# Patient Record
Sex: Female | Born: 1935 | Race: Black or African American | Hispanic: No | Marital: Married | Smoking: Former smoker
Health system: Southern US, Community
[De-identification: ages and names within clinical notes are randomized; demographics above are authoritative.]

## PROBLEM LIST (undated history)

## (undated) DIAGNOSIS — M8580 Other specified disorders of bone density and structure, unspecified site: Secondary | ICD-10-CM

## (undated) DIAGNOSIS — D509 Iron deficiency anemia, unspecified: Secondary | ICD-10-CM

## (undated) DIAGNOSIS — C801 Malignant (primary) neoplasm, unspecified: Secondary | ICD-10-CM

## (undated) DIAGNOSIS — T7840XA Allergy, unspecified, initial encounter: Secondary | ICD-10-CM

## (undated) DIAGNOSIS — E785 Hyperlipidemia, unspecified: Secondary | ICD-10-CM

## (undated) DIAGNOSIS — E538 Deficiency of other specified B group vitamins: Principal | ICD-10-CM

## (undated) HISTORY — DX: Hyperlipidemia, unspecified: E78.5

## (undated) HISTORY — DX: Iron deficiency anemia, unspecified: D50.9

## (undated) HISTORY — PX: SMALL INTESTINE SURGERY: SHX150

## (undated) HISTORY — DX: Allergy, unspecified, initial encounter: T78.40XA

## (undated) HISTORY — DX: Deficiency of other specified B group vitamins: E53.8

## (undated) HISTORY — DX: Other specified disorders of bone density and structure, unspecified site: M85.80

## (undated) HISTORY — PX: MINOR EXCISION EAR CANAL CYST: SHX6240

## (undated) HISTORY — PX: CATARACT EXTRACTION, BILATERAL: SHX1313

---

## 1998-01-26 ENCOUNTER — Encounter: Admission: RE | Admit: 1998-01-26 | Discharge: 1998-04-26 | Payer: Self-pay

## 2006-09-27 ENCOUNTER — Emergency Department (HOSPITAL_COMMUNITY): Admission: EM | Admit: 2006-09-27 | Discharge: 2006-09-27 | Payer: Self-pay | Admitting: Emergency Medicine

## 2011-07-31 ENCOUNTER — Other Ambulatory Visit: Payer: Self-pay | Admitting: Family Medicine

## 2011-07-31 DIAGNOSIS — R9389 Abnormal findings on diagnostic imaging of other specified body structures: Secondary | ICD-10-CM

## 2011-08-05 ENCOUNTER — Encounter (HOSPITAL_COMMUNITY): Payer: Self-pay

## 2011-08-05 ENCOUNTER — Ambulatory Visit (HOSPITAL_COMMUNITY)
Admission: RE | Admit: 2011-08-05 | Discharge: 2011-08-05 | Disposition: A | Discharge status: Home / Self Care | Payer: BC Managed Care – PPO | Source: Ambulatory Visit | Attending: Family Medicine | Admitting: Family Medicine

## 2011-08-05 DIAGNOSIS — R059 Cough, unspecified: Secondary | ICD-10-CM | POA: Insufficient documentation

## 2011-08-05 DIAGNOSIS — R9389 Abnormal findings on diagnostic imaging of other specified body structures: Secondary | ICD-10-CM

## 2011-08-05 DIAGNOSIS — R918 Other nonspecific abnormal finding of lung field: Secondary | ICD-10-CM | POA: Insufficient documentation

## 2011-08-05 DIAGNOSIS — J438 Other emphysema: Secondary | ICD-10-CM | POA: Insufficient documentation

## 2011-08-05 DIAGNOSIS — R05 Cough: Secondary | ICD-10-CM | POA: Insufficient documentation

## 2012-02-03 ENCOUNTER — Other Ambulatory Visit: Payer: Self-pay | Admitting: Family Medicine

## 2012-02-03 DIAGNOSIS — Z09 Encounter for follow-up examination after completed treatment for conditions other than malignant neoplasm: Secondary | ICD-10-CM

## 2012-02-07 ENCOUNTER — Other Ambulatory Visit (HOSPITAL_COMMUNITY): Payer: BC Managed Care – PPO

## 2012-02-07 ENCOUNTER — Ambulatory Visit (HOSPITAL_COMMUNITY): Payer: Medicare Other | Attending: Family Medicine

## 2012-02-25 ENCOUNTER — Ambulatory Visit (HOSPITAL_COMMUNITY)
Admission: RE | Admit: 2012-02-25 | Discharge: 2012-02-25 | Disposition: A | Discharge status: Home / Self Care | Payer: BC Managed Care – PPO | Source: Ambulatory Visit | Attending: Family Medicine | Admitting: Family Medicine

## 2012-02-25 DIAGNOSIS — Z09 Encounter for follow-up examination after completed treatment for conditions other than malignant neoplasm: Secondary | ICD-10-CM

## 2012-02-25 DIAGNOSIS — R911 Solitary pulmonary nodule: Secondary | ICD-10-CM | POA: Insufficient documentation

## 2012-11-02 ENCOUNTER — Other Ambulatory Visit: Payer: Self-pay | Admitting: Physician Assistant

## 2013-01-06 ENCOUNTER — Ambulatory Visit (INDEPENDENT_AMBULATORY_CARE_PROVIDER_SITE_OTHER): Payer: BC Managed Care – PPO | Admitting: Family Medicine

## 2013-01-06 ENCOUNTER — Encounter: Payer: Self-pay | Admitting: Family Medicine

## 2013-01-06 VITALS — BP 116/61 | HR 75 | Temp 98.7°F | Ht 64.0 in | Wt 150.5 lb

## 2013-01-06 DIAGNOSIS — Z0000 Encounter for general adult medical examination without abnormal findings: Secondary | ICD-10-CM

## 2013-01-06 DIAGNOSIS — J329 Chronic sinusitis, unspecified: Secondary | ICD-10-CM

## 2013-01-06 DIAGNOSIS — E785 Hyperlipidemia, unspecified: Secondary | ICD-10-CM

## 2013-01-06 LAB — CBC WITH DIFFERENTIAL/PLATELET
Basophils Absolute: 0 10*3/uL (ref 0.0–0.1)
Basophils Relative: 0 % (ref 0–1)
Eosinophils Absolute: 0.2 10*3/uL (ref 0.0–0.7)
Eosinophils Relative: 3 % (ref 0–5)
HCT: 31.7 % — ABNORMAL LOW (ref 36.0–46.0)
Hemoglobin: 9.9 g/dL — ABNORMAL LOW (ref 12.0–15.0)
Lymphocytes Relative: 30 % (ref 12–46)
Lymphs Abs: 1.9 10*3/uL (ref 0.7–4.0)
MCH: 22.6 pg — ABNORMAL LOW (ref 26.0–34.0)
MCHC: 31.2 g/dL (ref 30.0–36.0)
MCV: 72.4 fL — ABNORMAL LOW (ref 78.0–100.0)
Monocytes Absolute: 0.6 10*3/uL (ref 0.1–1.0)
Monocytes Relative: 10 % (ref 3–12)
Neutro Abs: 3.5 10*3/uL (ref 1.7–7.7)
Neutrophils Relative %: 57 % (ref 43–77)
Platelets: 233 10*3/uL (ref 150–400)
RBC: 4.38 MIL/uL (ref 3.87–5.11)
RDW: 16.8 % — ABNORMAL HIGH (ref 11.5–15.5)
WBC: 6.2 10*3/uL (ref 4.0–10.5)

## 2013-01-06 LAB — LIPID PANEL
Cholesterol: 200 mg/dL (ref 0–200)
HDL: 52 mg/dL (ref >39)
LDL Cholesterol: 127 mg/dL — ABNORMAL HIGH (ref 0–99)
Total CHOL/HDL Ratio: 3.8 Ratio
Triglycerides: 105 mg/dL (ref <150)
VLDL: 21 mg/dL (ref 0–40)

## 2013-01-06 LAB — TSH: TSH: 1.92 u[IU]/mL (ref 0.350–4.500)

## 2013-01-06 MED ORDER — FLUTICASONE PROPIONATE 50 MCG/ACT NA SUSP: 2.0000 | Freq: Every day | NASAL | Status: DC

## 2013-01-06 MED ORDER — LEVOFLOXACIN 500 MG PO TABS: 500.0000 mg | ORAL_TABLET | Freq: Every day | ORAL | Status: DC

## 2013-01-06 NOTE — Progress Notes (Signed)
  Subjective:    Patient ID: Janet Fletcher, female    DOB: 05-Aug-1935, 77 y.o.   MRN: [id number]  HPI This 77 y.o. female presents for evaluation of allergies and sinus congestion.  She c/o fever and facial discomfort.  She has been feeling washed out and tired.  She has been coughing and sneezing.  She c/o left facial discomfort and states she has been sick over a few weeks.  She has been rx'd abx zpak and it failed according to patient.  She states she is fasting and is due for labs.  She has hx of hyperlipidemia and is requesting a refill on her atorvastatin and is requesting some blood work.    Review of Systems  Constitutional: Positive for diaphoresis, appetite change and fatigue.  HENT: Positive for congestion, sore throat, rhinorrhea, sneezing, postnasal drip and sinus pressure. Negative for facial swelling, neck pain, neck stiffness and ear discharge.   Eyes: Negative.   Respiratory: Negative.   Cardiovascular: Negative.   Gastrointestinal: Negative.   Endocrine: Negative.   Genitourinary: Negative.         Objective:   Physical Exam  Vital signs noted  Well developed well nourished female.  HEENT - Head atraumatic Normocephalic                Eyes - PERRLA, Conjuctiva - clear Sclera- Clear EOMI                Ears - EAC's Wnl TM's Wnl Gross Hearing WNL                Nose - Nares patent                 Throat - oropharanx injected. Tenderness bilateral maxillary sinuses Respiratory - Lungs CTA bilateral Cardiac - RRR S1 and S2 without murmur GI - Abdomen soft Nontender and bowel sounds active x 4 Extremities - No edema. Neuro - Grossly intact.      Assessment & Plan:  Unspecified sinusitis (chronic) - Plan: levofloxacin (LEVAQUIN) 500 MG tablet, fluticasone (FLONASE) 50 MCG/ACT nasal spray, CBC with Differential, Lipid panel, TSH, COMPLETE METABOLIC PANEL WITH GFR  Routine general medical examination at a health care facility - Plan: CBC with Differential, Lipid  panel, TSH, COMPLETE METABOLIC PANEL WITH GFR  Other and unspecified hyperlipidemia - Plan: CBC with Differential, Lipid panel, TSH, COMPLETE METABOLIC PANEL WITH GFR, refill atorvastatin

## 2013-01-06 NOTE — Patient Instructions (Signed)

## 2013-01-07 ENCOUNTER — Other Ambulatory Visit: Payer: Self-pay | Admitting: Family Medicine

## 2013-01-07 LAB — COMPLETE METABOLIC PANEL WITH GFR
ALT: 10 U/L (ref 0–35)
AST: 20 U/L (ref 0–37)
Albumin: 4 g/dL (ref 3.5–5.2)
Alkaline Phosphatase: 77 U/L (ref 39–117)
BUN: 15 mg/dL (ref 6–23)
CO2: 28 mEq/L (ref 19–32)
Calcium: 8.8 mg/dL (ref 8.4–10.5)
Chloride: 107 mEq/L (ref 96–112)
Creat: 0.76 mg/dL (ref 0.50–1.10)
GFR, Est African American: 89 mL/min
GFR, Est Non African American: 78 mL/min
Glucose, Bld: 89 mg/dL (ref 70–99)
Potassium: 4.7 mEq/L (ref 3.5–5.3)
Sodium: 141 mEq/L (ref 135–145)
Total Bilirubin: 0.5 mg/dL (ref 0.3–1.2)
Total Protein: 6.7 g/dL (ref 6.0–8.3)

## 2013-01-08 ENCOUNTER — Other Ambulatory Visit: Payer: Self-pay | Admitting: *Deleted

## 2013-01-08 ENCOUNTER — Other Ambulatory Visit: Payer: Self-pay

## 2013-01-08 DIAGNOSIS — R7989 Other specified abnormal findings of blood chemistry: Secondary | ICD-10-CM

## 2013-01-08 MED ORDER — ATORVASTATIN CALCIUM 40 MG PO TABS: 40.0000 mg | ORAL_TABLET | Freq: Every day | ORAL | Status: DC

## 2013-01-08 NOTE — Progress Notes (Signed)
Pt informed iron otc rck CBC 1mos

## 2013-01-13 ENCOUNTER — Encounter: Payer: Self-pay | Admitting: General Practice

## 2013-01-13 ENCOUNTER — Ambulatory Visit: Payer: BC Managed Care – PPO | Admitting: General Practice

## 2013-01-13 VITALS — BP 128/78 | HR 66 | Temp 96.7°F | Ht 64.0 in | Wt 148.0 lb

## 2013-01-13 DIAGNOSIS — R040 Epistaxis: Secondary | ICD-10-CM

## 2013-01-13 NOTE — Patient Instructions (Addendum)

## 2013-01-13 NOTE — Progress Notes (Signed)
  Subjective:    Patient ID: Janet Fletcher, female    DOB: 1936/03/13, 77 y.o.   MRN: [id number]  Epistaxis  The bleeding has been from both nares. This is a chronic problem. The current episode started yesterday. The problem occurs constantly. The problem has been gradually improving. The bleeding is associated with nothing. She has tried nothing for the symptoms. Her past medical history is significant for frequent nosebleeds.  Patient denies applying pressure to nose to stop bleeding. Denies using nasal sprays this am. Last time she used spray was yesterday.  Reports seeing a ENT specialist two months ago and cauterization performed.     Review of Systems  Constitutional: Negative for fever and chills.  HENT: Positive for nosebleeds. Negative for ear pain, sneezing, postnasal drip and sinus pressure.   Respiratory: Negative for chest tightness and shortness of breath.   Cardiovascular: Negative for chest pain and palpitations.  Neurological: Negative for dizziness, weakness, numbness and headaches.       Objective:   Physical Exam  Constitutional: She is oriented to person, place, and time. She appears well-developed and well-nourished.  HENT:  Head: Normocephalic and atraumatic.  Right Ear: External ear normal.  Left Ear: External ear normal.  Cardiovascular: Normal rate, regular rhythm and normal heart sounds.   Pulmonary/Chest: Effort normal and breath sounds normal. No respiratory distress. She exhibits no tenderness.  Neurological: She is alert and oriented to person, place, and time.  Skin: Skin is warm and dry.  Psychiatric: She has a normal mood and affect.          Assessment & Plan:  1. Epistaxis -hold pressure at Kiesselbach's plexus for 15 minutes with slowing of bleeding -patient refused to have nasal packing, until after seeing specialist - Ambulatory referral to ENT (appointment made immediately, patient reports she has responsible person to drive her to  appointment in [place], with Dr. Andrey Campanile) -Patient verbalized understanding -Coralie Keens, FNP-C

## 2013-08-20 ENCOUNTER — Ambulatory Visit (INDEPENDENT_AMBULATORY_CARE_PROVIDER_SITE_OTHER): Payer: Medicare HMO | Admitting: General Practice

## 2013-08-20 ENCOUNTER — Encounter: Payer: Self-pay | Admitting: General Practice

## 2013-08-20 VITALS — BP 152/69 | HR 56 | Temp 97.3°F | Ht 64.0 in | Wt 146.0 lb

## 2013-08-20 DIAGNOSIS — Z0000 Encounter for general adult medical examination without abnormal findings: Secondary | ICD-10-CM

## 2013-08-20 DIAGNOSIS — E785 Hyperlipidemia, unspecified: Secondary | ICD-10-CM

## 2013-08-20 LAB — POCT CBC
Granulocyte percent: 56 %G (ref 37–80)
HCT, POC: 33.1 % — AB (ref 37.7–47.9)
Hemoglobin: 10.1 g/dL — AB (ref 12.2–16.2)
LYMPH, POC: 2.3 (ref 0.6–3.4)
MCH: 21.9 pg — AB (ref 27–31.2)
MCHC: 30.5 g/dL — AB (ref 31.8–35.4)
MCV: 71.9 fL — AB (ref 80–97)
MPV: 8.5 fL (ref 0–99.8)
PLATELET COUNT, POC: 219 10*3/uL (ref 142–424)
POC Granulocyte: 3.1 (ref 2–6.9)
POC LYMPH PERCENT: 42.4 %L (ref 10–50)
RBC: 4.6 M/uL (ref 4.04–5.48)
RDW, POC: 17.8 %
WBC: 5.5 10*3/uL (ref 4.6–10.2)

## 2013-08-20 MED ORDER — ATORVASTATIN CALCIUM 40 MG PO TABS: 40.0000 mg | ORAL_TABLET | Freq: Every day | ORAL | Status: DC

## 2013-08-20 NOTE — Progress Notes (Signed)
   Subjective:    Patient ID: Janet Fletcher, female    DOB: 1936-04-16, 78 y.o.   MRN: [id number]  HPI Patient presents today for annual exam. She has a history of hyperlipidemia and has been out of medications for past two weeks. Eats a healthy diet and exercises regularly.     Review of Systems  Constitutional: Negative for fever and chills.  Respiratory: Negative for chest tightness and shortness of breath.   Cardiovascular: Negative for chest pain and palpitations.  All other systems reviewed and are negative.       Objective:   Physical Exam  Constitutional: She is oriented to person, place, and time. She appears well-developed and well-nourished.  HENT:  Head: Normocephalic and atraumatic.  Right Ear: External ear normal.  Left Ear: External ear normal.  Mouth/Throat: Oropharynx is clear and moist.  Eyes: EOM are normal. Pupils are equal, round, and reactive to light.  Neck: Normal range of motion. Neck supple. No thyromegaly present.  Cardiovascular: Normal rate, regular rhythm and normal heart sounds.   Pulmonary/Chest: Effort normal and breath sounds normal. No respiratory distress. She exhibits no tenderness.  Abdominal: Soft. Bowel sounds are normal. She exhibits no distension. There is no tenderness.  Lymphadenopathy:    She has no cervical adenopathy.  Neurological: She is alert and oriented to person, place, and time.  Skin: Skin is warm and dry.  Psychiatric: She has a normal mood and affect.          Assessment & Plan:  1. Annual physical exam  - POCT CBC - CMP14+EGFR - Lipid panel  2. HLD (hyperlipidemia)  - atorvastatin (LIPITOR) 40 MG tablet; Take 1 tablet (40 mg total) by mouth daily.  Dispense: 30 tablet; Refill: 11 -Continue all current medications Labs pending F/u in 6 months Discussed benefits of regular exercise and healthy eating Patient verbalized understanding Erby Pian, FNP-C

## 2013-08-20 NOTE — Patient Instructions (Signed)

## 2013-08-21 LAB — CMP14+EGFR
ALK PHOS: 66 IU/L (ref 39–117)
ALT: 9 IU/L (ref 0–32)
AST: 20 IU/L (ref 0–40)
Albumin/Globulin Ratio: 1.7 (ref 1.1–2.5)
Albumin: 4 g/dL (ref 3.5–4.8)
BILIRUBIN TOTAL: 0.3 mg/dL (ref 0.0–1.2)
BUN / CREAT RATIO: 16 (ref 11–26)
BUN: 14 mg/dL (ref 8–27)
CO2: 27 mmol/L (ref 18–29)
CREATININE: 0.88 mg/dL (ref 0.57–1.00)
Calcium: 9.2 mg/dL (ref 8.7–10.3)
Chloride: 104 mmol/L (ref 97–108)
GFR calc non Af Amer: 64 mL/min/{1.73_m2} (ref >59)
GFR, EST AFRICAN AMERICAN: 74 mL/min/{1.73_m2} (ref >59)
GLOBULIN, TOTAL: 2.4 g/dL (ref 1.5–4.5)
Glucose: 90 mg/dL (ref 65–99)
Potassium: 4.4 mmol/L (ref 3.5–5.2)
SODIUM: 141 mmol/L (ref 134–144)
Total Protein: 6.4 g/dL (ref 6.0–8.5)

## 2013-08-21 LAB — LIPID PANEL
CHOL/HDL RATIO: 4.1 ratio (ref 0.0–4.4)
Cholesterol, Total: 207 mg/dL — ABNORMAL HIGH (ref 100–199)
HDL: 51 mg/dL (ref >39)
LDL Calculated: 134 mg/dL — ABNORMAL HIGH (ref 0–99)
TRIGLYCERIDES: 108 mg/dL (ref 0–149)
VLDL Cholesterol Cal: 22 mg/dL (ref 5–40)

## 2014-06-02 ENCOUNTER — Ambulatory Visit (INDEPENDENT_AMBULATORY_CARE_PROVIDER_SITE_OTHER): Payer: Medicare HMO

## 2014-06-02 DIAGNOSIS — Z23 Encounter for immunization: Secondary | ICD-10-CM

## 2014-06-11 ENCOUNTER — Ambulatory Visit (INDEPENDENT_AMBULATORY_CARE_PROVIDER_SITE_OTHER): Payer: Medicare HMO | Admitting: Nurse Practitioner

## 2014-06-11 VITALS — BP 117/63 | HR 64 | Temp 96.8°F | Ht 64.0 in | Wt 144.0 lb

## 2014-06-11 DIAGNOSIS — J069 Acute upper respiratory infection, unspecified: Secondary | ICD-10-CM

## 2014-06-11 DIAGNOSIS — F17210 Nicotine dependence, cigarettes, uncomplicated: Secondary | ICD-10-CM

## 2014-06-11 MED ORDER — AZITHROMYCIN 250 MG PO TABS: ORAL_TABLET | ORAL | Status: DC

## 2014-06-11 MED ORDER — HYDROCODONE-HOMATROPINE 5-1.5 MG/5ML PO SYRP: 5.0000 mL | ORAL_SOLUTION | Freq: Three times a day (TID) | ORAL | Status: DC | PRN

## 2014-06-11 NOTE — Progress Notes (Signed)
   Subjective:    Patient ID: Janet Fletcher, female    DOB: 11/03/1935, 78 y.o.   MRN: [id number]  HPI Patient in today c/o cough and congestion- started over a week ago-     Review of Systems  Constitutional: Negative.  Negative for fever and chills.  HENT: Positive for congestion and rhinorrhea. Negative for sore throat, trouble swallowing and voice change.   Respiratory: Positive for cough.   Cardiovascular: Negative.   Gastrointestinal: Negative.   Skin: Negative.   Neurological: Positive for headaches.  Psychiatric/Behavioral: Negative.   All other systems reviewed and are negative.      Objective:   Physical Exam  Constitutional: She is oriented to person, place, and time. She appears well-developed and well-nourished.  HENT:  Right Ear: Hearing, tympanic membrane, external ear and ear canal normal.  Left Ear: Hearing, tympanic membrane, external ear and ear canal normal.  Nose: Mucosal edema and rhinorrhea present. Right sinus exhibits no maxillary sinus tenderness and no frontal sinus tenderness. Left sinus exhibits no maxillary sinus tenderness and no frontal sinus tenderness.  Mouth/Throat: Uvula is midline, oropharynx is clear and moist and mucous membranes are normal.  Eyes: Pupils are equal, round, and reactive to light.  Neck: Normal range of motion. Neck supple.  Cardiovascular: Normal rate, regular rhythm and normal heart sounds.   Pulmonary/Chest: Effort normal. She has wheezes (faint exp wheezes).  Tight cough  Lymphadenopathy:    She has no cervical adenopathy.  Neurological: She is alert and oriented to person, place, and time.  Skin: Skin is warm and dry.  Psychiatric: She has a normal mood and affect. Her behavior is normal. Judgment and thought content normal.   BP 117/63 mmHg  Pulse 64  Temp(Src) 96.8 F (36 C) (Oral)  Ht 5\' 4"  (1.626 m)  Wt 144 lb (65.318 kg)  BMI 24.71 kg/m2        Assessment & Plan:  1. Upper respiratory infection with  cough and congestion 1. Take meds as prescribed 2. Use a cool mist humidifier especially during the winter months and when heat has been humid. 3. Use saline nose sprays frequently 4. Saline irrigations of the nose can be very helpful if done frequently.  * 4X daily for 1 week*  * Use of a nettie pot can be helpful with this. Follow directions with this* 5. Drink plenty of fluids 6. Keep thermostat turn down low 7.For any cough or congestion  Use plain Mucinex- regular strength or max strength is fine   * Children- consult with Pharmacist for dosing 8. For fever or aces or pains- take tylenol or ibuprofen appropriate for age and weight.  * for fevers greater than 101 orally you may alternate ibuprofen and tylenol every  3 hours.   - azithromycin (ZITHROMAX Z-PAK) 250 MG tablet; As directed  Dispense: 6 each; Refill: 0 - HYDROcodone-homatropine (HYCODAN) 5-1.5 MG/5ML syrup; Take 5 mLs by mouth every 8 (eight) hours as needed for cough.  Dispense: 120 mL; Refill: 0  2. Cigarette smoker one half pack a day or less Stop smoking  Mary-Margaret Hassell Done, FNP

## 2014-06-11 NOTE — Patient Instructions (Signed)

## 2014-08-15 ENCOUNTER — Telehealth: Payer: Self-pay | Admitting: *Deleted

## 2014-08-15 ENCOUNTER — Other Ambulatory Visit: Payer: Self-pay | Admitting: General Practice

## 2014-08-15 NOTE — Telephone Encounter (Signed)
Last seen 06/11/14 MMM  Last lipid 08/20/13

## 2014-08-15 NOTE — Telephone Encounter (Signed)
Deny lipitor refiil Patient NTBS for follow up and lab work

## 2014-08-15 NOTE — Telephone Encounter (Signed)
Aware,Janet Fletcher needs to schedule an appointment for an office visit and lab work to get any refills.

## 2014-08-20 DIAGNOSIS — H8390 Unspecified disease of inner ear, unspecified ear: Secondary | ICD-10-CM | POA: Diagnosis not present

## 2014-08-22 ENCOUNTER — Telehealth: Payer: Self-pay | Admitting: Nurse Practitioner

## 2014-09-15 ENCOUNTER — Ambulatory Visit: Payer: Medicare HMO | Admitting: Nurse Practitioner

## 2014-09-29 ENCOUNTER — Ambulatory Visit (INDEPENDENT_AMBULATORY_CARE_PROVIDER_SITE_OTHER): Payer: Medicare HMO | Admitting: Nurse Practitioner

## 2014-09-29 ENCOUNTER — Encounter: Payer: Self-pay | Admitting: Nurse Practitioner

## 2014-09-29 VITALS — BP 128/65 | HR 60 | Temp 97.2°F | Ht 64.0 in | Wt 144.0 lb

## 2014-09-29 DIAGNOSIS — F411 Generalized anxiety disorder: Secondary | ICD-10-CM | POA: Insufficient documentation

## 2014-09-29 DIAGNOSIS — Z23 Encounter for immunization: Secondary | ICD-10-CM | POA: Diagnosis not present

## 2014-09-29 DIAGNOSIS — E785 Hyperlipidemia, unspecified: Secondary | ICD-10-CM | POA: Diagnosis not present

## 2014-09-29 MED ORDER — ATORVASTATIN CALCIUM 40 MG PO TABS: 40.0000 mg | ORAL_TABLET | Freq: Every day | ORAL | Status: DC

## 2014-09-29 MED ORDER — DIAZEPAM 2 MG PO TABS: 2.0000 mg | ORAL_TABLET | Freq: Two times a day (BID) | ORAL | Status: DC | PRN

## 2014-09-29 NOTE — Patient Instructions (Signed)

## 2014-09-29 NOTE — Progress Notes (Signed)
   Subjective:    Patient ID: Janet Fletcher, female    DOB: 15-Dec-1935, 79 y.o.   MRN: [id number]   Patient here today for follow up of chronic medical problems. C/o lesion in front of right ear.   Hyperlipidemia This is a chronic problem. The current episode started more than 1 year ago. Recent lipid tests were reviewed and are variable. She has no history of diabetes, hypothyroidism or obesity. Current antihyperlipidemic treatment includes statins. The current treatment provides moderate improvement of lipids. Compliance problems include adherence to diet and adherence to exercise.  Risk factors for coronary artery disease include dyslipidemia, post-menopausal and a sedentary lifestyle.  GAD Valium daily- keeps her calm   Review of Systems  Constitutional: Negative.   HENT: Negative.   Respiratory: Negative.   Cardiovascular: Negative.   Gastrointestinal: Negative.   Genitourinary: Negative.   Neurological: Negative.   Psychiatric/Behavioral: Negative.   All other systems reviewed and are negative.      Objective:   Physical Exam  Constitutional: She is oriented to person, place, and time. She appears well-developed and well-nourished.  HENT:  Nose: Nose normal.  Mouth/Throat: Oropharynx is clear and moist.  Eyes: EOM are normal.  Neck: Trachea normal, normal range of motion and full passive range of motion without pain. Neck supple. No JVD present. Carotid bruit is not present. No thyromegaly present.  Cardiovascular: Normal rate, regular rhythm, normal heart sounds and intact distal pulses.  Exam reveals no gallop and no friction rub.   No murmur heard. Pulmonary/Chest: Effort normal and breath sounds normal.  Abdominal: Soft. Bowel sounds are normal. She exhibits no distension and no mass. There is no tenderness.  Musculoskeletal: Normal range of motion.  Lymphadenopathy:    She has no cervical adenopathy.  Neurological: She is alert and oriented to person, place, and  time. She has normal reflexes.  Skin: Skin is warm and dry.  Flesh colored skin tag anterior to right ear  Psychiatric: She has a normal mood and affect. Her behavior is normal. Judgment and thought content normal.  BP 128/65 mmHg  Pulse 60  Temp(Src) 97.2 F (36.2 C) (Oral)  Ht _0  (1.626 m)  Wt 144 lb (65.318 kg)  BMI 24.71 kg/m2         Assessment & Plan:  1. Hyperlipidemia with target LDL less than 100 Low fat diet - atorvastatin (LIPITOR) 40 MG tablet; Take 1 tablet (40 mg total) by mouth daily.  Dispense: 30 tablet; Refill: 11 - CMP14+EGFR - NMR, lipoprofile  2. GAD (generalized anxiety disorder) Stress management - diazepam (VALIUM) 2 MG tablet; Take 1 tablet (2 mg total) by mouth every 12 (twelve) hours as needed for anxiety.  Dispense: 30 tablet; Refill: 1   prevnar 13 today Labs pending Health maintenance reviewed Diet and exercise encouraged Continue all meds Follow up  In 3 month    [place], FNP

## 2014-09-29 NOTE — Addendum Note (Signed)
Addended by: Rolena Infante on: 09/29/2014 12:57 PM   Modules accepted: Orders

## 2014-09-30 LAB — NMR, LIPOPROFILE
Cholesterol: 212 mg/dL — ABNORMAL HIGH (ref 100–199)
HDL Cholesterol by NMR: 58 mg/dL (ref >39)
HDL Particle Number: 31.1 umol/L (ref >=30.5)
LDL Particle Number: 1427 nmol/L — ABNORMAL HIGH (ref <1000)
LDL SIZE: 20.9 nm (ref >20.5)
LDL-C: 133 mg/dL — AB (ref 0–99)
LP-IR Score: <25 (ref <=45)
Small LDL Particle Number: 603 nmol/L — ABNORMAL HIGH (ref <=527)
Triglycerides by NMR: 104 mg/dL (ref 0–149)

## 2014-09-30 LAB — CMP14+EGFR
A/G RATIO: 1.4 (ref 1.1–2.5)
ALBUMIN: 3.9 g/dL (ref 3.5–4.8)
ALT: 8 IU/L (ref 0–32)
AST: 20 IU/L (ref 0–40)
Alkaline Phosphatase: 75 IU/L (ref 39–117)
BUN/Creatinine Ratio: 16 (ref 11–26)
BUN: 12 mg/dL (ref 8–27)
Bilirubin Total: 0.4 mg/dL (ref 0.0–1.2)
CALCIUM: 9.2 mg/dL (ref 8.7–10.3)
CO2: 22 mmol/L (ref 18–29)
CREATININE: 0.73 mg/dL (ref 0.57–1.00)
Chloride: 103 mmol/L (ref 97–108)
GFR calc Af Amer: 93 mL/min/{1.73_m2} (ref >59)
GFR, EST NON AFRICAN AMERICAN: 80 mL/min/{1.73_m2} (ref >59)
GLOBULIN, TOTAL: 2.7 g/dL (ref 1.5–4.5)
Glucose: 78 mg/dL (ref 65–99)
Potassium: 4.1 mmol/L (ref 3.5–5.2)
Sodium: 143 mmol/L (ref 134–144)
TOTAL PROTEIN: 6.6 g/dL (ref 6.0–8.5)

## 2014-11-09 DIAGNOSIS — H25813 Combined forms of age-related cataract, bilateral: Secondary | ICD-10-CM | POA: Diagnosis not present

## 2014-11-09 DIAGNOSIS — H527 Unspecified disorder of refraction: Secondary | ICD-10-CM | POA: Diagnosis not present

## 2014-12-07 DIAGNOSIS — H527 Unspecified disorder of refraction: Secondary | ICD-10-CM | POA: Diagnosis not present

## 2014-12-07 DIAGNOSIS — H25813 Combined forms of age-related cataract, bilateral: Secondary | ICD-10-CM | POA: Diagnosis not present

## 2014-12-15 DIAGNOSIS — H25812 Combined forms of age-related cataract, left eye: Secondary | ICD-10-CM | POA: Insufficient documentation

## 2014-12-15 DIAGNOSIS — H527 Unspecified disorder of refraction: Secondary | ICD-10-CM | POA: Diagnosis not present

## 2014-12-15 DIAGNOSIS — F419 Anxiety disorder, unspecified: Secondary | ICD-10-CM | POA: Diagnosis not present

## 2014-12-15 DIAGNOSIS — Z880 Allergy status to penicillin: Secondary | ICD-10-CM | POA: Diagnosis not present

## 2014-12-15 DIAGNOSIS — H25813 Combined forms of age-related cataract, bilateral: Secondary | ICD-10-CM | POA: Diagnosis not present

## 2014-12-15 DIAGNOSIS — E785 Hyperlipidemia, unspecified: Secondary | ICD-10-CM | POA: Diagnosis not present

## 2014-12-15 DIAGNOSIS — F17200 Nicotine dependence, unspecified, uncomplicated: Secondary | ICD-10-CM | POA: Diagnosis not present

## 2014-12-20 ENCOUNTER — Telehealth: Payer: Self-pay | Admitting: *Deleted

## 2014-12-20 MED ORDER — SIMVASTATIN 40 MG PO TABS: 40.0000 mg | ORAL_TABLET | Freq: Every day | ORAL | Status: DC

## 2014-12-20 NOTE — Telephone Encounter (Signed)
lipitor changed to simvastatin- rx sent to pharmacy

## 2014-12-20 NOTE — Telephone Encounter (Signed)
Wife has been hurting from taking the lipitor. She needs to stop because she has to take Aleve for it.  Please send in something very cheap if she has to continue on cholesterol medication. They can not afford the rosuvastatin.

## 2014-12-21 NOTE — Telephone Encounter (Signed)
Aware of change to simvastatin.

## 2015-01-09 ENCOUNTER — Ambulatory Visit: Payer: Medicare HMO | Admitting: Nurse Practitioner

## 2015-01-19 DIAGNOSIS — Z9049 Acquired absence of other specified parts of digestive tract: Secondary | ICD-10-CM | POA: Diagnosis not present

## 2015-01-19 DIAGNOSIS — F17200 Nicotine dependence, unspecified, uncomplicated: Secondary | ICD-10-CM | POA: Diagnosis not present

## 2015-01-19 DIAGNOSIS — H25811 Combined forms of age-related cataract, right eye: Secondary | ICD-10-CM | POA: Insufficient documentation

## 2015-01-19 DIAGNOSIS — E780 Pure hypercholesterolemia: Secondary | ICD-10-CM | POA: Diagnosis not present

## 2015-01-19 DIAGNOSIS — Z881 Allergy status to other antibiotic agents status: Secondary | ICD-10-CM | POA: Diagnosis not present

## 2015-01-19 DIAGNOSIS — Z880 Allergy status to penicillin: Secondary | ICD-10-CM | POA: Diagnosis not present

## 2015-01-19 DIAGNOSIS — F419 Anxiety disorder, unspecified: Secondary | ICD-10-CM | POA: Diagnosis not present

## 2015-01-19 DIAGNOSIS — H527 Unspecified disorder of refraction: Secondary | ICD-10-CM | POA: Diagnosis not present

## 2015-01-19 DIAGNOSIS — E785 Hyperlipidemia, unspecified: Secondary | ICD-10-CM | POA: Diagnosis not present

## 2015-04-06 DIAGNOSIS — H5210 Myopia, unspecified eye: Secondary | ICD-10-CM | POA: Diagnosis not present

## 2015-05-26 ENCOUNTER — Encounter: Payer: Self-pay | Admitting: Nurse Practitioner

## 2015-05-26 ENCOUNTER — Ambulatory Visit (INDEPENDENT_AMBULATORY_CARE_PROVIDER_SITE_OTHER): Payer: Commercial Managed Care - HMO | Admitting: Nurse Practitioner

## 2015-05-26 VITALS — BP 126/69 | HR 71 | Temp 98.5°F | Ht 64.0 in | Wt 138.0 lb

## 2015-05-26 DIAGNOSIS — J069 Acute upper respiratory infection, unspecified: Secondary | ICD-10-CM

## 2015-05-26 DIAGNOSIS — B9789 Other viral agents as the cause of diseases classified elsewhere: Principal | ICD-10-CM

## 2015-05-26 MED ORDER — FLUTICASONE PROPIONATE 50 MCG/ACT NA SUSP: 2.0000 | Freq: Every day | NASAL | Status: DC

## 2015-05-26 NOTE — Progress Notes (Signed)
   Subjective:    Patient ID: Janet Fletcher, female    DOB: [birth year], 79 y.o.   MRN: [id number]  HPI Patient in today c/o cough and congestion. Stared last night- had dry cough all night long. Cough is nonproductive.    Review of Systems  HENT: Positive for congestion, ear pain, rhinorrhea and sinus pressure.   Respiratory: Negative.   Cardiovascular: Negative.   Genitourinary: Negative.   Neurological: Negative.   Psychiatric/Behavioral: Negative.   All other systems reviewed and are negative.      Objective:   Physical Exam  Constitutional: She is oriented to person, place, and time. She appears well-developed and well-nourished.  Neck: Normal range of motion. Neck supple.  Cardiovascular: Normal rate, regular rhythm and normal heart sounds.   Pulmonary/Chest: Effort normal and breath sounds normal.  Neurological: She is alert and oriented to person, place, and time.  Skin: Skin is warm.  Psychiatric: She has a normal mood and affect. Her behavior is normal. Judgment and thought content normal.    BP 126/69 mmHg  Pulse 71  Temp(Src) 98.5 F (36.9 C) (Oral)  Ht '5\' 4"'$  (1.626 m)  Wt 138 lb (62.596 kg)  BMI 23.68 kg/m2       Assessment & Plan:   1. Viral upper respiratory tract infection with cough    1. Take meds as prescribed 2. Use a cool mist humidifier especially during the winter months and when heat has been humid. 3. Use saline nose sprays frequently 4. Saline irrigations of the nose can be very helpful if done frequently.  * 4X daily for 1 week*  * Use of a nettie pot can be helpful with this. Follow directions with this* 5. Drink plenty of fluids 6. Keep thermostat turn down low 7.For any cough or congestion  Use plain Mucinex- regular strength or max strength is fine   * Children- consult with Pharmacist for dosing 8. For fever or aces or pains- take tylenol or ibuprofen appropriate for age and weight.  * for fevers greater than 101 orally you may  alternate ibuprofen and tylenol every  3 hours.   Meds ordered this encounter  Medications  . fluticasone (FLONASE) 50 MCG/ACT nasal spray    Sig: Place 2 sprays into both nostrils daily.    Dispense:  16 g    Refill:  6    Order Specific Question:  Supervising Provider    Answer:  Chipper Herb [[id number]]   Mary-Margaret Hassell Done, FNP

## 2015-05-26 NOTE — Patient Instructions (Signed)

## 2015-06-21 ENCOUNTER — Ambulatory Visit (INDEPENDENT_AMBULATORY_CARE_PROVIDER_SITE_OTHER): Payer: Commercial Managed Care - HMO

## 2015-06-21 DIAGNOSIS — Z23 Encounter for immunization: Secondary | ICD-10-CM | POA: Diagnosis not present

## 2015-07-18 DIAGNOSIS — Z0100 Encounter for examination of eyes and vision without abnormal findings: Secondary | ICD-10-CM | POA: Diagnosis not present

## 2015-07-23 HISTORY — PX: OTHER SURGICAL HISTORY: SHX169

## 2015-08-23 ENCOUNTER — Encounter: Payer: Commercial Managed Care - HMO | Admitting: Nurse Practitioner

## 2015-09-18 ENCOUNTER — Ambulatory Visit (INDEPENDENT_AMBULATORY_CARE_PROVIDER_SITE_OTHER): Payer: Commercial Managed Care - HMO | Admitting: Nurse Practitioner

## 2015-09-18 ENCOUNTER — Ambulatory Visit (INDEPENDENT_AMBULATORY_CARE_PROVIDER_SITE_OTHER): Payer: Commercial Managed Care - HMO

## 2015-09-18 ENCOUNTER — Encounter: Payer: Self-pay | Admitting: Nurse Practitioner

## 2015-09-18 VITALS — BP 133/67 | HR 61 | Temp 96.6°F | Ht 64.0 in | Wt 132.0 lb

## 2015-09-18 DIAGNOSIS — Z0000 Encounter for general adult medical examination without abnormal findings: Secondary | ICD-10-CM | POA: Diagnosis not present

## 2015-09-18 DIAGNOSIS — E785 Hyperlipidemia, unspecified: Secondary | ICD-10-CM | POA: Diagnosis not present

## 2015-09-18 DIAGNOSIS — F411 Generalized anxiety disorder: Secondary | ICD-10-CM | POA: Diagnosis not present

## 2015-09-18 LAB — POCT URINALYSIS DIPSTICK
BILIRUBIN UA: NEGATIVE
Glucose, UA: NEGATIVE
Ketones, UA: NEGATIVE
Leukocytes, UA: NEGATIVE
Nitrite, UA: NEGATIVE
Protein, UA: NEGATIVE
Spec Grav, UA: 1.01
Urobilinogen, UA: NEGATIVE
pH, UA: 5

## 2015-09-18 LAB — POCT UA - MICROSCOPIC ONLY
CASTS, UR, LPF, POC: NEGATIVE
Crystals, Ur, HPF, POC: NEGATIVE
YEAST UA: NEGATIVE

## 2015-09-18 NOTE — Progress Notes (Addendum)
   Subjective:    Patient ID: Janet Fletcher, female    DOB: [birth year], 80 y.o.   MRN: [id number]   Patient here today for  Annual physical exam and follow up of chronic medical problems.  Outpatient Encounter Prescriptions as of 09/18/2015  Medication Sig  . fluticasone (FLONASE) 50 MCG/ACT nasal spray Place 2 sprays into both nostrils daily.  . simvastatin (ZOCOR) 40 MG tablet Take 1 tablet (40 mg total) by mouth at bedtime.   No facility-administered encounter medications on file as of 09/18/2015.     Hyperlipidemia This is a chronic problem. The current episode started more than 1 year ago. Recent lipid tests were reviewed and are variable. She has no history of diabetes, hypothyroidism or obesity. Current antihyperlipidemic treatment includes statins. The current treatment provides moderate improvement of lipids. Compliance problems include adherence to diet and adherence to exercise.  Risk factors for coronary artery disease include dyslipidemia, post-menopausal and a sedentary lifestyle.  GAD Valium daily- keeps her calm   Review of Systems  Constitutional: Negative.   HENT: Negative.   Respiratory: Negative.   Cardiovascular: Negative.   Gastrointestinal: Negative.   Genitourinary: Negative.   Neurological: Negative.   Psychiatric/Behavioral: Negative.   All other systems reviewed and are negative.      Objective:   Physical Exam  Constitutional: She is oriented to person, place, and time. She appears well-developed and well-nourished.  HENT:  Nose: Nose normal.  Mouth/Throat: Oropharynx is clear and moist.  Eyes: EOM are normal.  Neck: Trachea normal, normal range of motion and full passive range of motion without pain. Neck supple. No JVD present. Carotid bruit is not present. No thyromegaly present.  Cardiovascular: Normal rate, regular rhythm, normal heart sounds and intact distal pulses.  Exam reveals no gallop and no friction rub.   No murmur  heard. Pulmonary/Chest: Effort normal. She has rales (bilateral  lower lobes).  Abdominal: Soft. Bowel sounds are normal. She exhibits no distension and no mass. There is no tenderness.  Musculoskeletal: Normal range of motion.  Lymphadenopathy:    She has no cervical adenopathy.  Neurological: She is alert and oriented to person, place, and time. She has normal reflexes.  Skin: Skin is warm and dry.  Flesh colored skin tag anterior to right ear  Psychiatric: She has a normal mood and affect. Her behavior is normal. Judgment and thought content normal.    BP 133/67 mmHg  Pulse 61  Temp(Src) 96.6 F (35.9 C) (Oral)  Ht '5\' 4"'$  (1.626 m)  Wt 132 lb (59.875 kg)  BMI 22.65 kg/m2  Chest x ray- no cardiopulmonary disorders-Preliminary reading by Ronnald Collum, FNP  [facility]  EKG- Kerry Hough, FNP       Assessment & Plan:  1. Annual physical exam - POCT urinalysis dipstick - POCT UA - Microscopic Only - CBC with Differential/Platelet - CMP14+EGFR - Thyroid Panel With TSH  2. Hyperlipidemia with target LDL less than 100 Low fat diet - Lipid panel - DG Chest 2 View; Future - EKG 12-Lead  3. GAD (generalized anxiety disorder) Stress management    Labs pending Health maintenance reviewed Diet and exercise encouraged Continue all meds Follow up  In 3 months   [place], FNP

## 2015-10-11 ENCOUNTER — Encounter: Payer: Self-pay | Admitting: Family Medicine

## 2015-10-11 ENCOUNTER — Ambulatory Visit (INDEPENDENT_AMBULATORY_CARE_PROVIDER_SITE_OTHER): Payer: Commercial Managed Care - HMO | Admitting: Family Medicine

## 2015-10-11 VITALS — BP 127/69 | HR 65 | Temp 97.2°F | Ht 64.0 in | Wt 132.4 lb

## 2015-10-11 DIAGNOSIS — J201 Acute bronchitis due to Hemophilus influenzae: Secondary | ICD-10-CM

## 2015-10-11 MED ORDER — HYDROCODONE-HOMATROPINE 5-1.5 MG/5ML PO SYRP: 5.0000 mL | ORAL_SOLUTION | Freq: Four times a day (QID) | ORAL | Status: DC | PRN

## 2015-10-11 MED ORDER — LEVOFLOXACIN 500 MG PO TABS: 500.0000 mg | ORAL_TABLET | Freq: Every day | ORAL | Status: DC

## 2015-10-11 NOTE — Progress Notes (Signed)
Subjective:  Patient ID: Janet Fletcher, female    DOB: Sep 26, 1935  Age: 80 y.o. MRN: [id number]  CC: URI   HPI SACORA HAWBAKER presents for Patient presents with upper respiratory congestion. Drainage without Sore throat or rhinorrhea.Patient reports coughing frequently as well. green-colored/purulent sputum noted. Pt. has no fever no chills no sweats. The patient denies being short of breath. Onset was 3-5 days ago. Gradually worsening.   History Aylanie has a past medical history of Hyperlipidemia; Allergy; and Osteopenia.   She has past surgical history that includes Small intestine surgery.   Her family history includes Cancer in her mother; Hypertension in her father.She reports that she has been smoking.  She does not have any smokeless tobacco history on file. She reports that she does not drink alcohol or use illicit drugs.    ROS Review of Systems  Constitutional: Negative for fever, chills, activity change and appetite change.  HENT: Positive for congestion and postnasal drip. Negative for ear discharge, ear pain, hearing loss, nosebleeds, rhinorrhea, sinus pressure, sneezing and trouble swallowing.   Respiratory: Positive for cough. Negative for chest tightness and shortness of breath.   Cardiovascular: Negative for chest pain and palpitations.  Skin: Negative for rash.    Objective:  BP 127/69 mmHg  Pulse 65  Temp(Src) 97.2 F (36.2 C) (Oral)  Ht '5\' 4"'$  (1.626 m)  Wt 132 lb 6.4 oz (60.056 kg)  BMI 22.72 kg/m2  SpO2 97%  BP Readings from Last 3 Encounters:  10/11/15 127/69  09/18/15 133/67  05/26/15 126/69    Wt Readings from Last 3 Encounters:  10/11/15 132 lb 6.4 oz (60.056 kg)  09/18/15 132 lb (59.875 kg)  05/26/15 138 lb (62.596 kg)     Physical Exam  Constitutional: She appears well-developed and well-nourished.  HENT:  Head: Normocephalic and atraumatic.  Right Ear: Tympanic membrane and external ear normal. No decreased hearing is noted.    Left Ear: Tympanic membrane and external ear normal. No decreased hearing is noted.  Nose: Mucosal edema present. Right sinus exhibits no frontal sinus tenderness. Left sinus exhibits no frontal sinus tenderness.  Mouth/Throat: No oropharyngeal exudate or posterior oropharyngeal erythema.  Neck: No Brudzinski's sign noted.  Pulmonary/Chest: Breath sounds normal. No respiratory distress.  Lymphadenopathy:       Head (right side): No preauricular adenopathy present.       Head (left side): No preauricular adenopathy present.       Right cervical: No superficial cervical adenopathy present.      Left cervical: No superficial cervical adenopathy present.     Lab Results  Component Value Date   WBC 5.5 08/20/2013   HGB 10.1* 08/20/2013   HCT 33.1* 08/20/2013   PLT 233 01/06/2013   GLUCOSE 78 09/29/2014   CHOL 212* 09/29/2014   TRIG 104 09/29/2014   HDL 58 09/29/2014   LDLCALC 134* 08/20/2013   ALT 8 09/29/2014   AST 20 09/29/2014   NA 143 09/29/2014   K 4.1 09/29/2014   CL 103 09/29/2014   CREATININE 0.73 09/29/2014   BUN 12 09/29/2014   CO2 22 09/29/2014   TSH 1.920 01/06/2013      Assessment & Plan:    1. Acute bronchitis due to Haemophilus influenzae     Meds ordered this encounter  Medications  . HYDROcodone-homatropine (HYCODAN) 5-1.5 MG/5ML syrup    Sig: Take 5 mLs by mouth every 6 (six) hours as needed for cough.    Dispense:  120 mL  Refill:  0  . levofloxacin (LEVAQUIN) 500 MG tablet    Sig: Take 1 tablet (500 mg total) by mouth daily.    Dispense:  7 tablet    Refill:  0     Follow-up: Return if symptoms worsen or fail to improve.  Claretta Fraise, M.D.

## 2015-11-27 ENCOUNTER — Ambulatory Visit: Payer: Commercial Managed Care - HMO

## 2015-11-27 DIAGNOSIS — R05 Cough: Secondary | ICD-10-CM | POA: Diagnosis not present

## 2015-11-27 DIAGNOSIS — R938 Abnormal findings on diagnostic imaging of other specified body structures: Secondary | ICD-10-CM | POA: Diagnosis not present

## 2015-11-27 DIAGNOSIS — J40 Bronchitis, not specified as acute or chronic: Secondary | ICD-10-CM | POA: Diagnosis not present

## 2015-11-28 ENCOUNTER — Other Ambulatory Visit: Payer: Self-pay | Admitting: Family

## 2015-11-28 ENCOUNTER — Encounter: Payer: Self-pay | Admitting: *Deleted

## 2015-11-28 ENCOUNTER — Encounter (INDEPENDENT_AMBULATORY_CARE_PROVIDER_SITE_OTHER): Payer: Self-pay

## 2015-11-28 ENCOUNTER — Ambulatory Visit (INDEPENDENT_AMBULATORY_CARE_PROVIDER_SITE_OTHER): Payer: Commercial Managed Care - HMO | Admitting: Family

## 2015-11-28 ENCOUNTER — Encounter: Payer: Self-pay | Admitting: Family

## 2015-11-28 ENCOUNTER — Ambulatory Visit (HOSPITAL_COMMUNITY)
Admission: RE | Admit: 2015-11-28 | Discharge: 2015-11-28 | Disposition: A | Discharge status: Home / Self Care | Payer: Commercial Managed Care - HMO | Source: Ambulatory Visit | Attending: Family | Admitting: Family

## 2015-11-28 VITALS — BP 129/72 | HR 59 | Temp 96.8°F | Ht 64.0 in | Wt 123.6 lb

## 2015-11-28 DIAGNOSIS — J984 Other disorders of lung: Secondary | ICD-10-CM | POA: Diagnosis not present

## 2015-11-28 DIAGNOSIS — R918 Other nonspecific abnormal finding of lung field: Secondary | ICD-10-CM

## 2015-11-28 DIAGNOSIS — R222 Localized swelling, mass and lump, trunk: Secondary | ICD-10-CM | POA: Insufficient documentation

## 2015-11-28 DIAGNOSIS — R938 Abnormal findings on diagnostic imaging of other specified body structures: Secondary | ICD-10-CM | POA: Diagnosis not present

## 2015-11-28 LAB — POCT I-STAT CREATININE: Creatinine, Ser: 0.7 mg/dL (ref 0.44–1.00)

## 2015-11-28 MED ORDER — IOPAMIDOL (ISOVUE-300) INJECTION 61%
75.0000 mL | Freq: Once | INTRAVENOUS | Status: AC | PRN
  Administered 2015-11-28: 75 mL via INTRAVENOUS

## 2015-11-28 NOTE — Patient Instructions (Signed)
Pulmonary Nodule A pulmonary nodule is a small, round growth of tissue in the lung. Pulmonary nodules can range in size from less than 1/5 inch (4 mm) to a little bigger than an inch (25 mm). Most pulmonary nodules are detected when imaging tests of the lung are being performed for a different problem. Pulmonary nodules are usually not cancerous (benign). However, some pulmonary nodules are cancerous (malignant). Follow-up treatment or testing is based on the size of the pulmonary nodule and your risk of getting lung cancer.  CAUSES Benign pulmonary nodules can be caused by various things. Some of the causes include:   Bacterial, fungal, or viral infections. This is usually an old infection that is no longer active, but it can sometimes be a current, active infection.  A benign mass of tissue.  Inflammation from conditions such as rheumatoid arthritis.   Abnormal blood vessels in the lungs. Malignant pulmonary nodules can result from lung cancer or from cancers that spread to the lung from other places in the body. SIGNS AND SYMPTOMS Pulmonary nodules usually do not cause symptoms. DIAGNOSIS Most often, pulmonary nodules are found incidentally when an X-ray or CT scan is performed to look for some other problem in the lung area. To help determine whether a pulmonary nodule is benign or malignant, your health care provider will take a medical history and order a variety of tests. Tests done may include:   Blood tests.  A skin test called a tuberculin test. This test is used to determine if you have been exposed to the germ that causes tuberculosis.   Chest X-rays. If possible, a new X-ray may be compared with X-rays you have had in the past.   CT scan. This test shows smaller pulmonary nodules more clearly than an X-ray.   Positron emission tomography (PET) scan. In this test, a safe amount of a radioactive substance is injected into the bloodstream. Then, the scan takes a picture of  the pulmonary nodule. The radioactive substance is eliminated from your body in your urine.   Biopsy. A tiny piece of the pulmonary nodule is removed so it can be checked under a microscope. TREATMENT  Pulmonary nodules that are benign normally do not require any treatment because they usually do not cause symptoms or breathing problems. Your health care provider may want to monitor the pulmonary nodule through follow-up CT scans. The frequency of these CT scans will vary based on the size of the nodule and the risk factors for lung cancer. For example, CT scans will need to be done more frequently if the pulmonary nodule is larger and if you have a history of smoking and a family history of cancer. Further testing or biopsies may be done if any follow-up CT scan shows that the size of the pulmonary nodule has increased. HOME CARE INSTRUCTIONS  Only take over-the-counter or prescription medicines as directed by your health care provider.  Keep all follow-up appointments with your health care provider. SEEK MEDICAL CARE IF:  You have trouble breathing when you are active.   You feel sick or unusually tired.   You do not feel like eating.   You lose weight without trying to.   You develop chills or night sweats.  SEEK IMMEDIATE MEDICAL CARE IF:  You cannot catch your breath, or you begin wheezing.   You cannot stop coughing.   You cough up blood.   You become dizzy or feel like you are going to pass out.   You   have sudden chest pain.   You have a fever or persistent symptoms for more than 2-3 days.   You have a fever and your symptoms suddenly get worse. MAKE SURE YOU:  Understand these instructions.  Will watch your condition.  Will get help right away if you are not doing well or get worse.   This information is not intended to replace advice given to you by your health care provider. Make sure you discuss any questions you have with your health care  provider.   Document Released: 05/05/2009 Document Revised: 03/10/2013 Document Reviewed: 12/28/2012 Elsevier Interactive Patient Education 2016 Elsevier Inc.  

## 2015-11-28 NOTE — Progress Notes (Signed)
   Subjective:    Patient ID: Janet Fletcher, female    DOB: March 30, 1936, 80 y.o.   MRN: [id number]  HPI Pt presents to the office today requesting a CT of her chest. Pt states she was seen in the Urgent Care yesterday and had a X-ray done and was told she had a "mass in her right lung".  Pt currently smoking one cigarette a day and states she started smoking about 6 years ago. Pt denies any SOB or pain. Pt states she does have an intermittent productive cough.   Review of Systems  All other systems reviewed and are negative.      Objective:   Physical Exam  Constitutional: She is oriented to person, place, and time. She appears well-developed and well-nourished. No distress.  HENT:  Head: Normocephalic and atraumatic.  Eyes: Pupils are equal, round, and reactive to light.  Neck: Normal range of motion. Neck supple. No thyromegaly present.  Cardiovascular: Normal rate, regular rhythm, normal heart sounds and intact distal pulses.   No murmur heard. Pulmonary/Chest: Effort normal. No respiratory distress. She has no wheezes.  Diminished breath sounds   Musculoskeletal: Normal range of motion. She exhibits no edema or tenderness.  Neurological: She is alert and oriented to person, place, and time.  Skin: Skin is warm and dry.  Psychiatric: She has a normal mood and affect. Her behavior is normal. Judgment and thought content normal.  Vitals reviewed.   BP 129/72 mmHg  Pulse 59  Temp(Src) 96.8 F (36 C) (Oral)  Ht '5\' 4"'$  (1.626 m)  Wt 123 lb 9.6 oz (56.065 kg)  BMI 21.21 kg/m2       Assessment & Plan:  1. Mass of lung - CT Chest W Contrast; Future  CT ordered Will send referral to oncologists if CT positive Smoking cessation discussed  Evelina Dun, FNP +

## 2015-11-29 ENCOUNTER — Encounter (HOSPITAL_COMMUNITY): Payer: Commercial Managed Care - HMO | Attending: Hematology & Oncology | Admitting: Hematology & Oncology

## 2015-11-29 ENCOUNTER — Encounter (HOSPITAL_COMMUNITY): Payer: Self-pay | Admitting: Hematology & Oncology

## 2015-11-29 VITALS — BP 118/68 | HR 74 | Temp 98.4°F | Resp 18 | Ht 64.0 in | Wt 122.0 lb

## 2015-11-29 DIAGNOSIS — Z888 Allergy status to other drugs, medicaments and biological substances status: Secondary | ICD-10-CM | POA: Insufficient documentation

## 2015-11-29 DIAGNOSIS — F17210 Nicotine dependence, cigarettes, uncomplicated: Secondary | ICD-10-CM | POA: Insufficient documentation

## 2015-11-29 DIAGNOSIS — D649 Anemia, unspecified: Secondary | ICD-10-CM | POA: Insufficient documentation

## 2015-11-29 DIAGNOSIS — J9859 Other diseases of mediastinum, not elsewhere classified: Secondary | ICD-10-CM

## 2015-11-29 DIAGNOSIS — M8580 Other specified disorders of bone density and structure, unspecified site: Secondary | ICD-10-CM | POA: Insufficient documentation

## 2015-11-29 DIAGNOSIS — Z9889 Other specified postprocedural states: Secondary | ICD-10-CM | POA: Insufficient documentation

## 2015-11-29 DIAGNOSIS — Z720 Tobacco use: Secondary | ICD-10-CM

## 2015-11-29 DIAGNOSIS — R9389 Abnormal findings on diagnostic imaging of other specified body structures: Secondary | ICD-10-CM

## 2015-11-29 DIAGNOSIS — J984 Other disorders of lung: Secondary | ICD-10-CM | POA: Diagnosis not present

## 2015-11-29 DIAGNOSIS — R918 Other nonspecific abnormal finding of lung field: Secondary | ICD-10-CM

## 2015-11-29 DIAGNOSIS — C3490 Malignant neoplasm of unspecified part of unspecified bronchus or lung: Secondary | ICD-10-CM | POA: Insufficient documentation

## 2015-11-29 DIAGNOSIS — E785 Hyperlipidemia, unspecified: Secondary | ICD-10-CM | POA: Insufficient documentation

## 2015-11-29 NOTE — Patient Instructions (Signed)
[place] at [facility] Discharge Instructions  RECOMMENDATIONS MADE BY THE CONSULTANT AND ANY TEST RESULTS WILL BE SENT TO YOUR REFERRING PHYSICIAN.  We will get you to see Dr. Roxan Hockey (a thoracic surgeon) We will get a PET scan as soon as possible Return to see [place] after PET  Thank you for choosing [place] at [facility] to provide your oncology and hematology care.  To afford each patient quality time with our provider, please arrive at least 15 minutes before your scheduled appointment time.   Beginning January 23rd 2017 lab work for the [facility] will be done in the  Main lab at [facility] on 1st floor. If you have a lab appointment with the [facility] please come in thru the  Main Entrance and check in at the main information desk  You need to re-schedule your appointment should you arrive 10 or more minutes late.  We strive to give you quality time with our providers, and arriving late affects you and other patients whose appointments are after yours.  Also, if you no show three or more times for appointments you may be dismissed from the clinic at the providers discretion.     Again, thank you for choosing [facility].  Our hope is that these requests will decrease the amount of time that you wait before being seen by our physicians.       _____________________________________________________________  Should you have questions after your visit to [facility], please contact our office at [phone] between the hours of 8:30 a.m. and 4:30 p.m.  Voicemails left after 4:30 p.m. will not be returned until the following business day.  For prescription refill requests, have your pharmacy contact our office.         Resources For Cancer Patients and their Caregivers ? American Cancer Society: Can assist with transportation, wigs, general needs, runs Look Good Feel Better.         (775) 215-0212 ? Cancer Care: Provides financial assistance, online support groups, medication/co-pay assistance.  1-800-813-HOPE 757-571-5315) ? [facility] Assists [place] cancer patients and their families through emotional , educational and financial support.  260-109-1825 ? [place] DSS Where to apply for food stamps, Medicaid and utility assistance. 339-726-7162 ? [facility]: Transportation to medical appointments. (604) 770-5184 ? Social Security Administration: May apply for disability if have a Stage IV cancer. (719) 636-0551 (808) 395-3369 ? [employer], Disability and Transit Services: Assists with nutrition, care and transit needs. [place] Support Programs: '@10RELATIVEDAYS'$ @ > Cancer Support Group  2nd Tuesday of the month 1pm-2pm, Journey Room  > Creative Journey  3rd Tuesday of the month 1130am-1pm, Journey Room  > Look Good Feel Better  1st Wednesday of the month 10am-12 noon, Journey Room (Call [place] to register [phone])

## 2015-11-29 NOTE — Progress Notes (Signed)
[facility]  Progress Note  Patient Care Team: Chevis Pretty, FNP as PCP - General (Nurse Practitioner)  CHIEF COMPLAINTS/PURPOSE OF CONSULTATION:  CT chest with Large anterior mediastinal mass in continuity with R hilar mass, causes narrowing of the SVC. Abnormal parenchymal densities in the RUL. 9 mm spiculated nodule, large complex of branching 3.6 cm mass. Nonspecific hypodensity in the liver.  HISTORY OF PRESENTING ILLNESS:  Janet Fletcher 80 y.o. female is here because of abnormal CT imaging of the chest.   Mrs. Earp was here with 3 daughters and her husband.   About 3 weeks ago she began coughing a lot. She said that her sinuses would drain into her throat and this would cause her to cough. She attributed her symptoms all to allergies.  She went to the doctor for this and was told that she had bronchitis. Symptoms did not improve. She then presented to Urgent Care and had a CXR. She notes she was advised to follow-up with her PCP because she had a mass in her lungs. She underwent CT imaging on 11/28/2015. Findings are detailed above.   She has been losing weight recently. She noticed this about 3 weeks ago. She thinks that she has lost about 20 pounds over the past couple months. She does not eat like she used to and does not have an appetite. Her daughters say that she eats like a bird. She drinks Boost sometimes.   She has not been smoking as much since she has been feeling bad. She has only smoked 1 cigarette today.   She said that nothing hurts her other than her cough.  She denies any headaches, blurry vision or abdominal pain.   She is up to date on her mammograms.  She presents today for further review of imaging and additional recommendations and evaluation.  MEDICAL HISTORY:  Past Medical History  Diagnosis Date  . Hyperlipidemia   . Allergy   . Osteopenia     SURGICAL HISTORY: Past Surgical History  Procedure Laterality Date  . Small  intestine surgery    . Cataract extraction, bilateral Bilateral   . Minor excision ear canal cyst Left     30+ years ago    SOCIAL HISTORY: Social History   Social History  . Marital Status: Married    Spouse Name: N/A  . Number of Children: N/A  . Years of Education: N/A   Occupational History  . Not on file.   Social History Main Topics  . Smoking status: Current Some Day Smoker -- 0.05 packs/day for 50 years  . Smokeless tobacco: Never Used  . Alcohol Use: No  . Drug Use: No  . Sexual Activity: Not on file   Other Topics Concern  . Not on file   Social History Narrative  Married; 45 years 5 daughters 2 sons 12 grandchildren, 8 great grandchildren Smokes now but not as much; started long time ago. 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 Cancer Mother   . Hypertension Father   Mother died at 98 years old. Father died at 20 years old. 7 sisters and 7 brothers 57 living Older sister died of leukemia.   ALLERGIES:  is allergic to acetaminophen; flagyl; and penicillins.  MEDICATIONS:  Current Outpatient Prescriptions  Medication Sig Dispense Refill  . cefdinir (OMNICEF) 300 MG  capsule     . simvastatin (ZOCOR) 40 MG tablet Take 1 tablet (40 mg total) by mouth at bedtime. 90 tablet 3  . fluticasone (FLONASE) 50 MCG/ACT nasal spray Place 2 sprays into both nostrils daily. (Patient not taking: Reported on 11/28/2015) 16 g 6   No current facility-administered medications for this visit.    Review of Systems  Constitutional: Positive for weight loss. Negative for fever, chills and malaise/fatigue.       Appetite loss.  HENT: Negative.  Negative for congestion, hearing loss, nosebleeds, sore throat and tinnitus.   Eyes: Negative.  Negative for blurred vision, double vision, pain and discharge.  Respiratory:  Positive for cough. Negative for hemoptysis, sputum production, shortness of breath and wheezing.   Cardiovascular: Negative.  Negative for chest pain, palpitations, claudication, leg swelling and PND.  Gastrointestinal: Negative.  Negative for heartburn, nausea, vomiting, abdominal pain, diarrhea, constipation, blood in stool and melena.  Genitourinary: Negative.  Negative for dysuria, urgency, frequency and hematuria.  Musculoskeletal: Negative.  Negative for myalgias, joint pain and falls.  Skin: Negative.  Negative for itching and rash.  Neurological: Negative.  Negative for dizziness, tingling, tremors, sensory change, speech change, focal weakness, seizures, loss of consciousness, weakness and headaches.  Endo/Heme/Allergies: Negative.  Does not bruise/bleed easily.  Psychiatric/Behavioral: Negative.  Negative for depression, suicidal ideas, memory loss and substance abuse. The patient is not nervous/anxious and does not have insomnia.   All other systems reviewed and are negative.  14 point ROS was done and is otherwise as detailed above or in HPI   PHYSICAL EXAMINATION: ECOG PERFORMANCE STATUS: 1 - Symptomatic but completely ambulatory  Filed Vitals:   11/29/15 1455  BP: 118/68  Pulse: 74  Temp: 98.4 F (36.9 C)  Resp: 18   Filed Weights   11/29/15 1455  Weight: 122 lb (55.339 kg)    Physical Exam  Constitutional: She is oriented to person, place, and time and well-developed, well-nourished, and in no distress.  HENT:  Head: Normocephalic and atraumatic.  Nose: Nose normal.  Mouth/Throat: Oropharynx is clear and moist. No oropharyngeal exudate.  Eyes: Conjunctivae and EOM are normal. Pupils are equal, round, and reactive to light. Right eye exhibits no discharge. Left eye exhibits no discharge. No scleral icterus.  Neck: Normal range of motion. Neck supple. No tracheal deviation present. No thyromegaly present.  Cardiovascular: Normal rate, regular rhythm and normal  heart sounds.  Exam reveals no gallop and no friction rub.   No murmur heard. Pulmonary/Chest: Effort normal and breath sounds normal. She has no wheezes. She has no rales.  Abdominal: Soft. Bowel sounds are normal. She exhibits no distension and no mass. There is no tenderness. There is no rebound and no guarding.  Musculoskeletal: Normal range of motion. She exhibits no edema.  Lymphadenopathy:    She has no cervical adenopathy.  Neurological: She is alert and oriented to person, place, and time. She has normal reflexes. No cranial nerve deficit. Gait normal. Coordination normal.  Skin: Skin is warm and dry. No rash noted.  Psychiatric: Mood, memory, affect and judgment normal.  Nursing note and vitals reviewed.   LABORATORY DATA:  I have reviewed the data as listed Lab Results  Component Value Date   WBC 5.5 08/20/2013   HGB 10.1* 08/20/2013   HCT 33.1* 08/20/2013   MCV 71.9* 08/20/2013   PLT 233 01/06/2013   CMP     Component Value Date/Time   NA 143 09/29/2014 1203   NA 141  01/06/2013 1428   K 4.1 09/29/2014 1203   CL 103 09/29/2014 1203   CO2 22 09/29/2014 1203   GLUCOSE 78 09/29/2014 1203   GLUCOSE 89 01/06/2013 1428   BUN 12 09/29/2014 1203   BUN 15 01/06/2013 1428   CREATININE 0.70 11/28/2015 1031   CREATININE 0.76 01/06/2013 1428   CALCIUM 9.2 09/29/2014 1203   PROT 6.6 09/29/2014 1203   PROT 6.7 01/06/2013 1428   ALBUMIN 3.9 09/29/2014 1203   ALBUMIN 4.0 01/06/2013 1428   AST 20 09/29/2014 1203   ALT 8 09/29/2014 1203   ALKPHOS 75 09/29/2014 1203   BILITOT 0.4 09/29/2014 1203   BILITOT 0.3 08/20/2013 1537   GFRNONAA 80 09/29/2014 1203   GFRNONAA 78 01/06/2013 1428   GFRAA 93 09/29/2014 1203   GFRAA 89 01/06/2013 1428    RADIOGRAPHIC STUDIES: I have personally reviewed the radiological images as listed and agreed with the findings in the report. Ct Chest W Contrast  11/28/2015  CLINICAL DATA:  Abnormal chest x-ray EXAM: CT CHEST WITH CONTRAST  TECHNIQUE: Multidetector CT imaging of the chest was performed during intravenous contrast administration. CONTRAST:  32m ISOVUE-300 IOPAMIDOL (ISOVUE-300) INJECTION 61% COMPARISON:  None. FINDINGS: 3 mm right thyroid hypodensity. Large middle mediastinal mass is anterior to the trachea extending into the AP window into the right paratracheal regions. This causes marked narrowing of the SVC. 2.9 x 2.0 cm right hilar mass. This is probably in direct continuity with the larger middle mediastinal mass. This can be appreciated on coronal reformats. See image 53 of series 4. There is a complex branching mass in the anterior inferior right upper lobe on image 75 measuring 2.4 x 3.6 cm. An anterior segmental bronchus is occluded at the level of this mass. See image 73 of series 3. There is also a small spiculated anterior right upper lobe mass on image 58 measuring 9 mm. Vague focal ground-glass opacity in the right upper lobe on image 65 measures approximately 10 mm. Dependent atelectasis at the posterior right lung base. No left lung masses. Significant emphysema throughout the lungs with a prep collection for the lung apices is present. No pneumothorax or pleural effusion. Images of the upper abdomen demonstrate a 7 mm hypodensity in the lateral segment of the left lobe of the liver anteriorly. There is also prominent calcified plaque in the juxtarenal aorta. IMPRESSION: Large anterior mediastinal mass in continuity with a right hilar mass worrisome for malignancy. This does cause narrowing of the SVC. There are abnormal parenchymal densities in the right upper lobe. There is a 9 mm spiculated nodule, large complex of branching 3.6 cm mass, and a vague 10 mm ground-glass area. Again, malignancy is suspected. Nonspecific hypodensity in the liver. Tiny hypodensity in the right lobe of the thyroid gland. Ultrasound can be performed. Electronically Signed   By: AMarybelle KillingsM.D.   On: 11/28/2015 10:58   Study Result       CLINICAL DATA: Hypertension, hyperlipidemia, current smoker.  EXAM: CHEST 2 VIEW  COMPARISON: CT scan of the chest dated February 25, 2012  FINDINGS: The lungs are mildly hyperinflated with hemidiaphragm flattening. There is no focal infiltrate. There is no pleural effusion. The heart is normal in size. The pulmonary vascularity is not engorged. The mediastinum is normal in width. There is mild tortuosity of the descending thoracic aorta. The bony thorax is unremarkable.  IMPRESSION: COPD. There is no evidence of pneumonia nor other acute cardiopulmonary abnormality.  A screening chest CT scan done  in August 2013 demonstrated findings for which follow-up in 1 year was recommended. If this has not been performed, a repeat chest CT scan would be useful.   Electronically Signed  By: David Martinique M.D.  On: 09/18/2015 11:34   ASSESSMENT & PLAN:  Abnormal CT imaging of chest Tobacco Abuse  I discussed with the patient and family that findings are certainly concerning for primary lung cancer however a biopsy is needed for definitive diagnosis. She had a "normal" CXR at the end of February, ? SCLC. I have recommended the following;  1. Referral to Dr. Roxan Hockey, she does have evidence of narrowing of the SVC currently asymptomatic. I will contact him via EPIC and telephone 2. PET/CT 3. She will need Head imaging preferentially with MRI 4. Follow-up 3 to 4 days post biopsy, sooner if results available for treatment planning 5. Patient and family met with Hildred Alamin our navigator today 6. She was provided with samples of boost   She will return in 2 weeks after her PET scan.   All questions were answered. The patient knows to call the clinic with any problems, questions or concerns.  This document serves as a record of services personally performed by Ancil Linsey, MD. It was created on her behalf by Kandace Blitz, a trained medical scribe. The creation of this  record is based on the scribe's personal observations and the provider's statements to them. This document has been checked and approved by the attending provider.  I have reviewed the above documentation for accuracy and completeness, and I agree with the above.  This note was electronically signed.  Molli Hazard, MD  11/29/2015 3:35 PM

## 2015-12-05 ENCOUNTER — Ambulatory Visit
Admission: RE | Admit: 2015-12-05 | Discharge: 2015-12-05 | Disposition: A | Discharge status: Home / Self Care | Payer: Commercial Managed Care - HMO | Source: Ambulatory Visit | Attending: Hematology & Oncology | Admitting: Hematology & Oncology

## 2015-12-05 DIAGNOSIS — I700 Atherosclerosis of aorta: Secondary | ICD-10-CM | POA: Insufficient documentation

## 2015-12-05 DIAGNOSIS — R911 Solitary pulmonary nodule: Secondary | ICD-10-CM | POA: Insufficient documentation

## 2015-12-05 DIAGNOSIS — J9859 Other diseases of mediastinum, not elsewhere classified: Secondary | ICD-10-CM | POA: Diagnosis not present

## 2015-12-05 DIAGNOSIS — J984 Other disorders of lung: Secondary | ICD-10-CM | POA: Insufficient documentation

## 2015-12-05 DIAGNOSIS — R918 Other nonspecific abnormal finding of lung field: Secondary | ICD-10-CM | POA: Diagnosis not present

## 2015-12-05 LAB — GLUCOSE, CAPILLARY: GLUCOSE-CAPILLARY: 83 mg/dL (ref 65–99)

## 2015-12-05 MED ORDER — FLUDEOXYGLUCOSE F - 18 (FDG) INJECTION
13.0100 | Freq: Once | INTRAVENOUS | Status: AC | PRN
  Administered 2015-12-05: 13.01 via INTRAVENOUS

## 2015-12-06 ENCOUNTER — Encounter (HOSPITAL_COMMUNITY): Payer: Self-pay | Admitting: Hematology & Oncology

## 2015-12-06 ENCOUNTER — Encounter (HOSPITAL_BASED_OUTPATIENT_CLINIC_OR_DEPARTMENT_OTHER): Payer: Commercial Managed Care - HMO | Admitting: Hematology & Oncology

## 2015-12-06 VITALS — BP 135/92 | HR 63 | Temp 98.0°F | Resp 20 | Wt 119.7 lb

## 2015-12-06 DIAGNOSIS — D649 Anemia, unspecified: Secondary | ICD-10-CM | POA: Diagnosis not present

## 2015-12-06 DIAGNOSIS — R918 Other nonspecific abnormal finding of lung field: Secondary | ICD-10-CM | POA: Diagnosis not present

## 2015-12-06 DIAGNOSIS — Z720 Tobacco use: Secondary | ICD-10-CM

## 2015-12-06 NOTE — Progress Notes (Signed)
[place]  PROGRESS NOTE  Patient Care Team: Chevis Pretty, FNP as PCP - General (Nurse Practitioner)  CHIEF COMPLAINTS/PURPOSE OF CONSULTATION:  CT chest with Large anterior mediastinal mass in continuity with R hilar mass, causes narrowing of the SVC. Abnormal parenchymal densities in the RUL. 9 mm spiculated nodule, large complex of branching 3.6 cm mass. Nonspecific hypodensity in the liver.  HISTORY OF PRESENTING ILLNESS:  Janet Fletcher 80 y.o. female is here because of abnormal CT imaging of the chest highly suggestive of primary lung malignancy.  Janet Fletcher returns to the [facility] today accompanied by six family members. She returns today to review PET/CT.   The patient will see Dr. Roxan Hockey tomorrow at 3:45.   She is very quiet during the appointment, and when asked about her thoughts, she says "you have to do what you have to do."   Her family members say that she's not depressed; "more or less that she's nervous."   She denies SOB, facial swelling, worsening cough. Appetite is less, no new pain.   MEDICAL HISTORY:  Past Medical History  Diagnosis Date  . Hyperlipidemia   . Allergy   . Osteopenia     SURGICAL HISTORY: Past Surgical History  Procedure Laterality Date  . Small intestine surgery    . Cataract extraction, bilateral Bilateral   . Minor excision ear canal cyst Left     30+ years ago    SOCIAL HISTORY: Social History   Social History  . Marital Status: Married    Spouse Name: N/A  . Number of Children: N/A  . Years of Education: N/A   Occupational History  . Not on file.   Social History Main Topics  . Smoking status: Current Some Day Smoker -- 0.05 packs/day for 50 years  . Smokeless tobacco: Never Used  . Alcohol Use: No  . Drug Use: No  . Sexual Activity: Not on file   Other Topics Concern  . Not on file   Social History Narrative  Married; 69 years 5 daughters 2 sons 12 grandchildren, 8 great  grandchildren Smokes now but not as much; started long time ago. 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 Cancer Mother   . Hypertension Father   Mother died at 77 years old. Father died at 3 years old. 7 sisters and 7 brothers 35 living Older sister died of leukemia.   ALLERGIES:  is allergic to acetaminophen; flagyl; and penicillins.  MEDICATIONS:  Current Outpatient Prescriptions  Medication Sig Dispense Refill  . simvastatin (ZOCOR) 40 MG tablet Take 1 tablet (40 mg total) by mouth at bedtime. 90 tablet 3  . fluticasone (FLONASE) 50 MCG/ACT nasal spray Place 2 sprays into both nostrils daily. (Patient not taking: Reported on 11/28/2015) 16 g 6   No current facility-administered medications for this visit.    Review of Systems  Constitutional: Positive for weight loss. Negative for fever, chills and malaise/fatigue.       Appetite loss.  HENT: Negative.  Negative for congestion, hearing loss, nosebleeds, sore throat and tinnitus.   Eyes: Negative.  Negative for blurred vision, double vision, pain and discharge.  Respiratory: Positive for cough. Negative for hemoptysis, sputum production, shortness of breath and wheezing.   Cardiovascular: Negative.  Negative for chest pain, palpitations, claudication, leg swelling and PND.  Gastrointestinal:  Negative.  Negative for heartburn, nausea, vomiting, abdominal pain, diarrhea, constipation, blood in stool and melena.  Genitourinary: Negative.  Negative for dysuria, urgency, frequency and hematuria.  Musculoskeletal: Negative.  Negative for myalgias, joint pain and falls.  Skin: Negative.  Negative for itching and rash.  Neurological: Negative.  Negative for dizziness, tingling, tremors, sensory change, speech change, focal weakness, seizures, loss of consciousness, weakness and  headaches.  Endo/Heme/Allergies: Negative.  Does not bruise/bleed easily.  Psychiatric/Behavioral: Negative.  Negative for depression, suicidal ideas, memory loss and substance abuse. The patient is not nervous/anxious and does not have insomnia.   All other systems reviewed and are negative.  14 point ROS was done and is otherwise as detailed above or in HPI    PHYSICAL EXAMINATION: ECOG PERFORMANCE STATUS: 1 - Symptomatic but completely ambulatory  Filed Vitals:   12/06/15 0915  BP: 135/92  Pulse: 63  Temp: 98 F (36.7 C)  Resp: 20   Filed Weights   12/06/15 0915  Weight: 119 lb 11.2 oz (54.296 kg)    Physical Exam  Constitutional: She is oriented to person, place, and time and well-developed, well-nourished, and in no distress.  HENT:  Head: Normocephalic and atraumatic.  Nose: Nose normal.  Mouth/Throat: Oropharynx is clear and moist. No oropharyngeal exudate.  Eyes: Conjunctivae and EOM are normal. Pupils are equal, round, and reactive to light. Right eye exhibits no discharge. Left eye exhibits no discharge. No scleral icterus.  Neck: Normal range of motion. Neck supple. No tracheal deviation present. No thyromegaly present.  Cardiovascular: Normal rate, regular rhythm and normal heart sounds.  Exam reveals no gallop and no friction rub.   No murmur heard. Pulmonary/Chest: Effort normal and breath sounds normal. She has no wheezes. She has no rales.  Abdominal: Soft. Bowel sounds are normal. She exhibits no distension and no mass. There is no tenderness. There is no rebound and no guarding.  Musculoskeletal: Normal range of motion. She exhibits no edema.  Lymphadenopathy:    She has no cervical adenopathy.  Neurological: She is alert and oriented to person, place, and time. She has normal reflexes. No cranial nerve deficit. Gait normal. Coordination normal.  Skin: Skin is warm and dry. No rash noted.  Psychiatric: Mood, memory, affect and judgment normal.  Nursing  note and vitals reviewed.   LABORATORY DATA:  I have reviewed the data as listed Lab Results  Component Value Date   WBC 5.5 08/20/2013   HGB 10.1* 08/20/2013   HCT 33.1* 08/20/2013   MCV 71.9* 08/20/2013   PLT 233 01/06/2013   CMP     Component Value Date/Time   NA 143 09/29/2014 1203   NA 141 01/06/2013 1428   K 4.1 09/29/2014 1203   CL 103 09/29/2014 1203   CO2 22 09/29/2014 1203   GLUCOSE 78 09/29/2014 1203   GLUCOSE 89 01/06/2013 1428   BUN 12 09/29/2014 1203   BUN 15 01/06/2013 1428   CREATININE 0.70 11/28/2015 1031   CREATININE 0.76 01/06/2013 1428   CALCIUM 9.2 09/29/2014 1203   PROT 6.6 09/29/2014 1203   PROT 6.7 01/06/2013 1428   ALBUMIN 3.9 09/29/2014 1203   ALBUMIN 4.0 01/06/2013 1428   AST 20 09/29/2014 1203   ALT 8 09/29/2014 1203   ALKPHOS 75 09/29/2014 1203   BILITOT 0.4 09/29/2014 1203   BILITOT 0.3 08/20/2013 1537   GFRNONAA 80 09/29/2014 1203   GFRNONAA 78 01/06/2013 1428   GFRAA 93 09/29/2014 1203   GFRAA 89 01/06/2013 1428  RADIOGRAPHIC STUDIES: I have personally reviewed the radiological images as listed and agreed with the findings in the report. Nm Pet Image Initial (pi) Skull Base To Thigh  12/05/2015  CLINICAL DATA:  Initial treatment strategy for mediastinal mass. EXAM: NUCLEAR MEDICINE PET SKULL BASE TO THIGH TECHNIQUE: Thirteen mCi F-18 FDG was injected intravenously. Full-ring PET imaging was performed from the skull base to thigh after the radiotracer. CT data was obtained and used for attenuation correction and anatomic localization. FASTING BLOOD GLUCOSE:  Value: 83 mg/dl COMPARISON:  CT 11/28/2015 FINDINGS: NECK Hypermetabolic nodules in the central RIGHT upper lobe measuring approximately 1 cm each (image 99, series 3) with intense metabolic activity (SUV max 11.9). Small nodule in the RIGHT upper lobe medially measuring 10 mm on image 90, series 3 has mild metabolic activity. The large RIGHT paratracheal mass measures 7 cm with  intense peripheral metabolic activity with SUV max equal 12.9. This metabolic activity extends to the high RIGHT paratracheal location. Mass surrounds the SVC. No LEFT lung hypermetabolic nodules. CHEST No hypermetabolic mediastinal or hilar nodes. No suspicious pulmonary nodules on the CT scan. ABDOMEN/PELVIS No abnormal hypermetabolic activity within the liver, pancreas, adrenal glands, or spleen. No hypermetabolic lymph nodes in the abdomen or pelvis. There is dense sclerosis of the aorta which extends into the lumen. Uterus normal. SKELETON No focal hypermetabolic activity to suggest skeletal metastasis. IMPRESSION: 1. Large intensely hypermetabolic paratracheal mass most consistent with metastatic small cell lung carcinoma. 2. Perihilar nodular densities in the RIGHT middle lobe are likely pulmonary metastasis. 3. Mild metabolic activity associated are RIGHT upper lobe nodule may represent primary lesion. 4. No evidence of distant metastatic disease. 5. Dense sclerosis of the aorta is extends into the lumen of the infrarenal abdominal aorta LEFT common iliac artery. Electronically Signed   By: Suzy Bouchard M.D.   On: 12/05/2015 17:19     Study Result     CLINICAL DATA: Initial treatment strategy for mediastinal mass.  EXAM: NUCLEAR MEDICINE PET SKULL BASE TO THIGH  TECHNIQUE: Thirteen mCi F-18 FDG was injected intravenously. Full-ring PET imaging was performed from the skull base to thigh after the radiotracer. CT data was obtained and used for attenuation correction and anatomic localization.  FASTING BLOOD GLUCOSE: Value: 83 mg/dl  COMPARISON: CT 11/28/2015  FINDINGS: NECK  Hypermetabolic nodules in the central RIGHT upper lobe measuring approximately 1 cm each (image 99, series 3) with intense metabolic activity (SUV max 11.9). Small nodule in the RIGHT upper lobe medially measuring 10 mm on image 90, series 3 has mild metabolic activity.  The large RIGHT  paratracheal mass measures 7 cm with intense peripheral metabolic activity with SUV max equal 12.9. This metabolic activity extends to the high RIGHT paratracheal location. Mass surrounds the SVC.  No LEFT lung hypermetabolic nodules.  CHEST  No hypermetabolic mediastinal or hilar nodes. No suspicious pulmonary nodules on the CT scan.  ABDOMEN/PELVIS  No abnormal hypermetabolic activity within the liver, pancreas, adrenal glands, or spleen. No hypermetabolic lymph nodes in the abdomen or pelvis. There is dense sclerosis of the aorta which extends into the lumen. Uterus normal.  SKELETON  No focal hypermetabolic activity to suggest skeletal metastasis.  IMPRESSION: 1. Large intensely hypermetabolic paratracheal mass most consistent with metastatic small cell lung carcinoma. 2. Perihilar nodular densities in the RIGHT middle lobe are likely pulmonary metastasis. 3. Mild metabolic activity associated are RIGHT upper lobe nodule may represent primary lesion. 4. No evidence of distant metastatic disease. 5. Dense sclerosis  of the aorta is extends into the lumen of the infrarenal abdominal aorta LEFT common iliac artery.   Electronically Signed  By: Suzy Bouchard M.D.  On: 12/05/2015 17:19     ASSESSMENT & PLAN:  Abnormal CT imaging of chest  Tobacco Abuse H/O anemia   PET/CT was reviewed in detail. She has an appointment with Dr. Roxan Hockey tomorrow afternoon.   Lab review shows a history of anemia. Labs will be repeated at follow-up, if anemia persists will work-up given that she will need chemotherapy. We briefly addressed issues today such as port a cath use, chemotherapy. I again advised the family and patient that a tissue diagnosis is needed prior to treatment planning.  Dr. Tammi Klippel reviewed PET imaging today. I anticipate at some point she will need referral to XRT depending on pathology.   She will need MRI brain, this will be ordered  today.  Will see her back post biopsy.   All questions were answered. The patient knows to call the clinic with any problems, questions or concerns.  This document serves as a record of services personally performed by Ancil Linsey, MD. It was created on her behalf by Toni Amend, a trained medical scribe. The creation of this record is based on the scribe's personal observations and the provider's statements to them. This document has been checked and approved by the attending provider.  I have reviewed the above documentation for accuracy and completeness, and I agree with the above.  This note was electronically signed.  Molli Hazard, MD  12/06/2015 12:55 PM

## 2015-12-06 NOTE — Patient Instructions (Addendum)
[place] at [facility] Discharge Instructions  RECOMMENDATIONS MADE BY THE CONSULTANT AND ANY TEST RESULTS WILL BE SENT TO YOUR REFERRING PHYSICIAN.   Please call Hildred Alamin @ [phone] tomorrow after Dr. Leonarda Salon appointment. We need to know when he is taking you for a bronchoscopy.  We will move forward with getting you prepped for chemo after the appt with him tomorrow.     Thank you for choosing [place] at [facility] to provide your oncology and hematology care.  To afford each patient quality time with our provider, please arrive at least 15 minutes before your scheduled appointment time.   Beginning January 23rd 2017 lab work for the [facility] will be done in the  Main lab at [facility] on 1st floor. If you have a lab appointment with the [facility] please come in thru the  Main Entrance and check in at the main information desk  You need to re-schedule your appointment should you arrive 10 or more minutes late.  We strive to give you quality time with our providers, and arriving late affects you and other patients whose appointments are after yours.  Also, if you no show three or more times for appointments you may be dismissed from the clinic at the providers discretion.     Again, thank you for choosing [facility].  Our hope is that these requests will decrease the amount of time that you wait before being seen by our physicians.       _____________________________________________________________  Should you have questions after your visit to [facility], please contact our office at [phone] between the hours of 8:30 a.m. and 4:30 p.m.  Voicemails left after 4:30 p.m. will not be returned until the following business day.  For prescription refill requests, have your pharmacy contact our office.         Resources For Cancer Patients and their Caregivers ? American Cancer  Society: Can assist with transportation, wigs, general needs, runs Look Good Feel Better.        (671) 038-9770 ? Cancer Care: Provides financial assistance, online support groups, medication/co-pay assistance.  1-800-813-HOPE (774)831-7300) ? [facility] Assists [place] cancer patients and their families through emotional , educational and financial support.  256 843 2087 ? [place] DSS Where to apply for food stamps, Medicaid and utility assistance. (817) 504-9787 ? [facility]: Transportation to medical appointments. 9161198546 ? Social Security Administration: May apply for disability if have a Stage IV cancer. 9712660184 585-251-2027 ? [employer], Disability and Transit Services: Assists with nutrition, care and transit needs. [place] Support Programs: '@10RELATIVEDAYS'$ @ > Cancer Support Group  2nd Tuesday of the month 1pm-2pm, Journey Room  > Creative Journey  3rd Tuesday of the month 1130am-1pm, Journey Room  > Look Good Feel Better  1st Wednesday of the month 10am-12 noon, Journey Room (Call [facility] to register [phone])

## 2015-12-07 ENCOUNTER — Other Ambulatory Visit (HOSPITAL_COMMUNITY): Payer: Self-pay | Admitting: *Deleted

## 2015-12-07 ENCOUNTER — Institutional Professional Consult (permissible substitution) (INDEPENDENT_AMBULATORY_CARE_PROVIDER_SITE_OTHER): Payer: Commercial Managed Care - HMO | Admitting: Thoracic Surgery (Cardiothoracic Vascular Surgery)

## 2015-12-07 ENCOUNTER — Telehealth (HOSPITAL_COMMUNITY): Payer: Self-pay | Admitting: *Deleted

## 2015-12-07 VITALS — BP 114/66 | HR 73 | Resp 16 | Ht 64.0 in | Wt 119.0 lb

## 2015-12-07 DIAGNOSIS — R918 Other nonspecific abnormal finding of lung field: Secondary | ICD-10-CM | POA: Diagnosis not present

## 2015-12-07 DIAGNOSIS — C3491 Malignant neoplasm of unspecified part of right bronchus or lung: Secondary | ICD-10-CM | POA: Insufficient documentation

## 2015-12-07 DIAGNOSIS — J984 Other disorders of lung: Secondary | ICD-10-CM

## 2015-12-07 DIAGNOSIS — R911 Solitary pulmonary nodule: Secondary | ICD-10-CM

## 2015-12-07 DIAGNOSIS — C3490 Malignant neoplasm of unspecified part of unspecified bronchus or lung: Secondary | ICD-10-CM

## 2015-12-07 NOTE — Progress Notes (Signed)
PCP is Chevis Pretty, FNP Referring Provider is Penland, Kelby Fam, MD  Chief Complaint  Patient presents with  . Lung Mass    RULobe...RMLobe...CT CHEST 11/28/15, PET 12/05/15  . Mediastinal Mass    HPI: 80 year old woman sent for consultation for biopsy of the mediastinal mass.  Janet Fletcher is a 80 year old woman with a history of tobacco abuse (one half pack per day for over 50 years, 25+ pack years). Past medical history is otherwise unremarkable with only seasonal allergy, hyperlipidemia, and generalized anxiety disorder. She did have a small bowel resection of her child for about 50 years ago and has had bilateral cataract surgery.  She was in her usual state of health until about a month ago when she developed a persistent cough. She attributed this to sinus drainage. She was treated empirically for possible bronchitis, but her symptoms did not improve. She had a severe coughing spell and went to an urgent care. A chest x-ray was done and showed a "mass in her lung." A CT was done on 11/28/2015. It showed a large right hilar/mediastinal mass. A PET CT was done on 12/05/2015 which showed the mass was hypermetabolic. There were multiple lung nodules that were very poorly hypermetabolic.  She smokes about one cigarette a day currently. She complains of a cough did have some yellow sputum. She denies hemoptysis. She denies chest pain or shortness of breath. She has a poor appetite. She has lost about 20 pounds in the past 3 months. She still is working part-time.  Zubrod Score: At the time of surgery this patient's most appropriate activity status/level should be described as: '[x]'$     0    Normal activity, no symptoms '[]'$     1    Restricted in physical strenuous activity but ambulatory, able to do out light work '[]'$     2    Ambulatory and capable of self care, unable to do work activities, up and about >50 % of waking hours                              '[]'$     3    Only limited self care, in  bed greater than 50% of waking hours '[]'$     4    Completely disabled, no self care, confined to bed or chair '[]'$     5    Moribund  Past Medical History  Diagnosis Date  . Hyperlipidemia   . Allergy   . Osteopenia     Past Surgical History  Procedure Laterality Date  . Small intestine surgery    . Cataract extraction, bilateral Bilateral   . Minor excision ear canal cyst Left     30+ years ago    Family History  Problem Relation Age of Onset  . Cancer Mother   . Hypertension Father     Social History Social History  Substance Use Topics  . Smoking status: Current Some Day Smoker -- 0.05 packs/day for 50 years  . Smokeless tobacco: Never Used  . Alcohol Use: No    Current Outpatient Prescriptions  Medication Sig Dispense Refill  . simvastatin (ZOCOR) 40 MG tablet Take 1 tablet (40 mg total) by mouth at bedtime. 90 tablet 3  . fluticasone (FLONASE) 50 MCG/ACT nasal spray Place 2 sprays into both nostrils daily. (Patient not taking: Reported on 11/28/2015) 16 g 6   No current facility-administered medications for this visit.    Allergies  Allergen Reactions  . Acetaminophen Other (See Comments)    HEART RACES AWAY/ EXTRA STRENGTH TYLENOL  . Flagyl [Metronidazole Hcl]   . Penicillins     Review of Systems  Constitutional: Positive for appetite change and unexpected weight change (15 weight loss in 3 months). Negative for fever, chills, activity change and fatigue.  HENT: Negative for trouble swallowing and voice change.   Eyes: Negative for visual disturbance.  Respiratory: Positive for cough. Negative for shortness of breath and wheezing.   Cardiovascular: Negative for chest pain and palpitations.  Gastrointestinal: Negative for abdominal pain and blood in stool.  Endocrine: Negative for polydipsia and polyphagia.  Genitourinary: Negative for dysuria and difficulty urinating.  Musculoskeletal: Negative for myalgias and arthralgias.  Neurological: Negative for  syncope, weakness and numbness.  Hematological: Negative for adenopathy. Bruises/bleeds easily (Bruises easily, no excessive bleeding).  Psychiatric/Behavioral: Negative.   All other systems reviewed and are negative.   BP 114/66 mmHg  Pulse 73  Resp 16  Ht '5\' 4"'$  (1.626 m)  Wt 119 lb (53.978 kg)  BMI 20.42 kg/m2  SpO2 92% Physical Exam  Constitutional: She is oriented to person, place, and time. She appears well-developed and well-nourished. No distress.  HENT:  Head: Normocephalic and atraumatic.  Mouth/Throat: No oropharyngeal exudate.  Eyes: Conjunctivae and EOM are normal. No scleral icterus.  Neck: Neck supple. No thyromegaly present.  Cardiovascular: Normal rate, regular rhythm and normal heart sounds.  Exam reveals no gallop and no friction rub.   No murmur heard. Pulmonary/Chest: Effort normal and breath sounds normal. No respiratory distress. She has no wheezes. She has no rales.  Abdominal: Soft. She exhibits no distension. There is no tenderness.  Musculoskeletal: She exhibits no edema.  Lymphadenopathy:    She has no cervical adenopathy.  Neurological: She is alert and oriented to person, place, and time. No cranial nerve deficit.  No focal motor deficit  Skin: Skin is warm and dry.  Vitals reviewed.    Diagnostic Tests: CT CHEST WITH CONTRAST  TECHNIQUE: Multidetector CT imaging of the chest was performed during intravenous contrast administration.  CONTRAST: 58m ISOVUE-300 IOPAMIDOL (ISOVUE-300) INJECTION 61%  COMPARISON: None.  FINDINGS: 3 mm right thyroid hypodensity.  Large middle mediastinal mass is anterior to the trachea extending into the AP window into the right paratracheal regions. This causes marked narrowing of the SVC.  2.9 x 2.0 cm right hilar mass. This is probably in direct continuity with the larger middle mediastinal mass. This can be appreciated on coronal reformats. See image 53 of series 4.  There is a complex  branching mass in the anterior inferior right upper lobe on image 75 measuring 2.4 x 3.6 cm. An anterior segmental bronchus is occluded at the level of this mass. See image 73 of series 3.  There is also a small spiculated anterior right upper lobe mass on image 58 measuring 9 mm. Vague focal ground-glass opacity in the right upper lobe on image 65 measures approximately 10 mm. Dependent atelectasis at the posterior right lung base. No left lung masses. Significant emphysema throughout the lungs with a prep collection for the lung apices is present.  No pneumothorax or pleural effusion.  Images of the upper abdomen demonstrate a 7 mm hypodensity in the lateral segment of the left lobe of the liver anteriorly. There is also prominent calcified plaque in the juxtarenal aorta.  IMPRESSION: Large anterior mediastinal mass in continuity with a right hilar mass worrisome for malignancy. This does cause narrowing of  the SVC.  There are abnormal parenchymal densities in the right upper lobe. There is a 9 mm spiculated nodule, large complex of branching 3.6 cm mass, and a vague 10 mm ground-glass area. Again, malignancy is suspected.  Nonspecific hypodensity in the liver.  Tiny hypodensity in the right lobe of the thyroid gland. Ultrasound can be performed.   Electronically Signed  By: Marybelle Killings M.D.  On: 11/28/2015 10:58  NUCLEAR MEDICINE PET SKULL BASE TO THIGH  TECHNIQUE: Thirteen mCi F-18 FDG was injected intravenously. Full-ring PET imaging was performed from the skull base to thigh after the radiotracer. CT data was obtained and used for attenuation correction and anatomic localization.  FASTING BLOOD GLUCOSE: Value: 83 mg/dl  COMPARISON: CT 11/28/2015  FINDINGS: NECK  Hypermetabolic nodules in the central RIGHT upper lobe measuring approximately 1 cm each (image 99, series 3) with intense metabolic activity (SUV max 11.9). Small nodule in the  RIGHT upper lobe medially measuring 10 mm on image 90, series 3 has mild metabolic activity.  The large RIGHT paratracheal mass measures 7 cm with intense peripheral metabolic activity with SUV max equal 12.9. This metabolic activity extends to the high RIGHT paratracheal location. Mass surrounds the SVC.  No LEFT lung hypermetabolic nodules.  CHEST  No hypermetabolic mediastinal or hilar nodes. No suspicious pulmonary nodules on the CT scan.  ABDOMEN/PELVIS  No abnormal hypermetabolic activity within the liver, pancreas, adrenal glands, or spleen. No hypermetabolic lymph nodes in the abdomen or pelvis. There is dense sclerosis of the aorta which extends into the lumen. Uterus normal.  SKELETON  No focal hypermetabolic activity to suggest skeletal metastasis.  IMPRESSION: 1. Large intensely hypermetabolic paratracheal mass most consistent with metastatic small cell lung carcinoma. 2. Perihilar nodular densities in the RIGHT middle lobe are likely pulmonary metastasis. 3. Mild metabolic activity associated are RIGHT upper lobe nodule may represent primary lesion. 4. No evidence of distant metastatic disease. 5. Dense sclerosis of the aorta is extends into the lumen of the infrarenal abdominal aorta LEFT common iliac artery.   Electronically Signed  By: Suzy Bouchard M.D.  On: 12/05/2015 17:19  I personally reviewed the CT and PET CT and concur with the findings as noted above  Impression: 80 year old woman with a history tobacco abuse who has a large right hilar and mediastinal mass along with multiple right-sided lung nodules. This is highly suspicious for a new primary bronchogenic carcinoma. It could be either small cell (most likely) or non-small cell. Lymphoma is also within the differential diagnosis. She needs a biopsy for an diagnostic purposes in order to guide therapy. The mass is not surgically resectable.  I reviewed the CT and PET CT with  the patient and her daughters. I propose that we proceed with bronchoscopy, endobronchial ultrasound, and possible mediastinoscopy for diagnostic purposes. They understand this would be done under general anesthesia on an outpatient basis. I reviewed the indications, risks, benefits, and alternatives. They understand the risks include are not limited to death, MI, DVT, PE, bleeding, possibly transfusion, possible need for sternotomy, stroke, recurrent nerve injury, esophageal injury, infection, and pneumothorax.  She accepts the risks and wishes to proceed as soon as possible.  Plan: Bronchoscopy, endobronchial ultrasound, possible mediastinoscopy on Monday 12/11/2015  Melrose Nakayama, MD [facility] [phone]

## 2015-12-08 ENCOUNTER — Other Ambulatory Visit: Payer: Self-pay

## 2015-12-08 ENCOUNTER — Encounter (HOSPITAL_COMMUNITY): Payer: Self-pay | Admitting: General Practice

## 2015-12-08 ENCOUNTER — Other Ambulatory Visit: Payer: Self-pay | Admitting: *Deleted

## 2015-12-08 DIAGNOSIS — J9859 Other diseases of mediastinum, not elsewhere classified: Secondary | ICD-10-CM

## 2015-12-08 NOTE — Progress Notes (Signed)
Contacted patient for pre-op instructions but patient informed nurse that daughter, Carolin Coy, needed to be contacted for review of history and instructions.  Carolin Coy reviewed patient's history with nurse  PCP - Dr. Redge Gainer Cardiologist - denies  EKG - DOS CXR - DOS  Echo/stress test/cardiac cath - denies  Per Carolin Coy, patient has not experienced any shortness of breath or chest pain.    Nurse instructed patient's daughter that patient should arrive to [facility] on 12/11/15 at 0645, patient needs to be NPO after midnight the night prior to the procedure, and there are no medications that patient needs to take the morning of surgery.  Daughter verbalized understanding.

## 2015-12-10 MED ORDER — VANCOMYCIN HCL IN DEXTROSE 1-5 GM/200ML-% IV SOLN
1000.0000 mg | INTRAVENOUS | Status: AC
  Administered 2015-12-11: 1000 mg via INTRAVENOUS
  Filled 2015-12-10: qty 200

## 2015-12-11 ENCOUNTER — Other Ambulatory Visit: Payer: Self-pay | Admitting: Radiology

## 2015-12-11 ENCOUNTER — Encounter: Payer: Self-pay | Admitting: Radiation Oncology

## 2015-12-11 ENCOUNTER — Ambulatory Visit (HOSPITAL_COMMUNITY): Payer: Commercial Managed Care - HMO

## 2015-12-11 ENCOUNTER — Other Ambulatory Visit: Payer: Self-pay | Admitting: General Surgery

## 2015-12-11 ENCOUNTER — Ambulatory Visit (HOSPITAL_COMMUNITY): Payer: Commercial Managed Care - HMO | Admitting: Anesthesiology

## 2015-12-11 ENCOUNTER — Encounter (HOSPITAL_COMMUNITY)
Admission: RE | Disposition: A | Discharge status: Home / Self Care | Payer: Self-pay | Source: Ambulatory Visit | Attending: Thoracic Surgery (Cardiothoracic Vascular Surgery)

## 2015-12-11 ENCOUNTER — Other Ambulatory Visit (HOSPITAL_COMMUNITY): Payer: Self-pay | Admitting: Hematology & Oncology

## 2015-12-11 ENCOUNTER — Encounter (HOSPITAL_COMMUNITY): Payer: Self-pay | Admitting: Certified Registered Nurse Anesthetist

## 2015-12-11 ENCOUNTER — Ambulatory Visit (HOSPITAL_COMMUNITY)
Admission: RE | Admit: 2015-12-11 | Discharge: 2015-12-11 | Disposition: A | Discharge status: Home / Self Care | Payer: Commercial Managed Care - HMO | Source: Ambulatory Visit | Attending: Thoracic Surgery (Cardiothoracic Vascular Surgery) | Admitting: Thoracic Surgery (Cardiothoracic Vascular Surgery)

## 2015-12-11 DIAGNOSIS — D152 Benign neoplasm of mediastinum: Secondary | ICD-10-CM | POA: Diagnosis not present

## 2015-12-11 DIAGNOSIS — J9859 Other diseases of mediastinum, not elsewhere classified: Secondary | ICD-10-CM

## 2015-12-11 DIAGNOSIS — Z880 Allergy status to penicillin: Secondary | ICD-10-CM | POA: Diagnosis not present

## 2015-12-11 DIAGNOSIS — F17210 Nicotine dependence, cigarettes, uncomplicated: Secondary | ICD-10-CM | POA: Insufficient documentation

## 2015-12-11 DIAGNOSIS — Z01818 Encounter for other preprocedural examination: Secondary | ICD-10-CM | POA: Diagnosis not present

## 2015-12-11 DIAGNOSIS — R222 Localized swelling, mass and lump, trunk: Secondary | ICD-10-CM | POA: Diagnosis not present

## 2015-12-11 DIAGNOSIS — E785 Hyperlipidemia, unspecified: Secondary | ICD-10-CM | POA: Diagnosis not present

## 2015-12-11 DIAGNOSIS — C3411 Malignant neoplasm of upper lobe, right bronchus or lung: Secondary | ICD-10-CM | POA: Diagnosis not present

## 2015-12-11 DIAGNOSIS — F411 Generalized anxiety disorder: Secondary | ICD-10-CM | POA: Diagnosis not present

## 2015-12-11 DIAGNOSIS — J385 Laryngeal spasm: Secondary | ICD-10-CM

## 2015-12-11 DIAGNOSIS — R918 Other nonspecific abnormal finding of lung field: Secondary | ICD-10-CM | POA: Diagnosis not present

## 2015-12-11 HISTORY — PX: VIDEO BRONCHOSCOPY WITH ENDOBRONCHIAL ULTRASOUND: SHX6177

## 2015-12-11 LAB — CBC
HCT: 32.6 % — ABNORMAL LOW (ref 36.0–46.0)
Hemoglobin: 9.4 g/dL — ABNORMAL LOW (ref 12.0–15.0)
MCH: 22.4 pg — AB (ref 26.0–34.0)
MCHC: 28.8 g/dL — AB (ref 30.0–36.0)
MCV: 77.8 fL — ABNORMAL LOW (ref 78.0–100.0)
PLATELETS: 191 10*3/uL (ref 150–400)
RBC: 4.19 MIL/uL (ref 3.87–5.11)
RDW: 15 % (ref 11.5–15.5)
WBC: 4.3 10*3/uL (ref 4.0–10.5)

## 2015-12-11 LAB — PROTIME-INR
INR: 1.22 (ref 0.00–1.49)
Prothrombin Time: 15.6 seconds — ABNORMAL HIGH (ref 11.6–15.2)

## 2015-12-11 LAB — COMPREHENSIVE METABOLIC PANEL
ALBUMIN: 3.1 g/dL — AB (ref 3.5–5.0)
ALT: 12 U/L — ABNORMAL LOW (ref 14–54)
ANION GAP: 7 (ref 5–15)
AST: 23 U/L (ref 15–41)
Alkaline Phosphatase: 56 U/L (ref 38–126)
BUN: 13 mg/dL (ref 6–20)
CHLORIDE: 106 mmol/L (ref 101–111)
CO2: 26 mmol/L (ref 22–32)
Calcium: 9.1 mg/dL (ref 8.9–10.3)
Creatinine, Ser: 0.84 mg/dL (ref 0.44–1.00)
GFR calc non Af Amer: >60 mL/min (ref >60)
Glucose, Bld: 89 mg/dL (ref 65–99)
POTASSIUM: 4.2 mmol/L (ref 3.5–5.1)
SODIUM: 139 mmol/L (ref 135–145)
Total Bilirubin: 0.3 mg/dL (ref 0.3–1.2)
Total Protein: 6.4 g/dL — ABNORMAL LOW (ref 6.5–8.1)

## 2015-12-11 LAB — TYPE AND SCREEN
ABO/RH(D): A POS
ANTIBODY SCREEN: NEGATIVE

## 2015-12-11 LAB — APTT: APTT: 28 s (ref 24–37)

## 2015-12-11 LAB — ABO/RH: ABO/RH(D): A POS

## 2015-12-11 SURGERY — BRONCHOSCOPY, WITH EBUS
Anesthesia: General

## 2015-12-11 MED ORDER — PROCHLORPERAZINE EDISYLATE 5 MG/ML IJ SOLN: 10.0000 mg | INTRAMUSCULAR | Status: DC | PRN

## 2015-12-11 MED ORDER — ROCURONIUM BROMIDE 100 MG/10ML IV SOLN
INTRAVENOUS | Status: DC | PRN
  Administered 2015-12-11: 50 mg via INTRAVENOUS

## 2015-12-11 MED ORDER — NEOSTIGMINE METHYLSULFATE 5 MG/5ML IV SOSY
PREFILLED_SYRINGE | INTRAVENOUS | Status: AC
  Filled 2015-12-11: qty 5

## 2015-12-11 MED ORDER — PHENYLEPHRINE 40 MCG/ML (10ML) SYRINGE FOR IV PUSH (FOR BLOOD PRESSURE SUPPORT)
PREFILLED_SYRINGE | INTRAVENOUS | Status: AC
  Filled 2015-12-11: qty 10

## 2015-12-11 MED ORDER — MIDAZOLAM HCL 2 MG/2ML IJ SOLN
INTRAMUSCULAR | Status: AC
  Filled 2015-12-11: qty 2

## 2015-12-11 MED ORDER — ALBUTEROL SULFATE (2.5 MG/3ML) 0.083% IN NEBU
2.5000 mg | INHALATION_SOLUTION | Freq: Four times a day (QID) | RESPIRATORY_TRACT | Status: DC | PRN
  Administered 2015-12-11: 2.5 mg via RESPIRATORY_TRACT

## 2015-12-11 MED ORDER — ONDANSETRON HCL 4 MG/2ML IJ SOLN
INTRAMUSCULAR | Status: DC | PRN
  Administered 2015-12-11: 4 mg via INTRAVENOUS

## 2015-12-11 MED ORDER — GLYCOPYRROLATE 0.2 MG/ML IV SOSY
PREFILLED_SYRINGE | INTRAVENOUS | Status: AC
  Filled 2015-12-11: qty 3

## 2015-12-11 MED ORDER — ARTIFICIAL TEARS OP OINT
TOPICAL_OINTMENT | OPHTHALMIC | Status: AC
  Filled 2015-12-11: qty 3.5

## 2015-12-11 MED ORDER — ONDANSETRON HCL 8 MG PO TABS: 8.0000 mg | ORAL_TABLET | Freq: Three times a day (TID) | ORAL | Status: DC | PRN

## 2015-12-11 MED ORDER — LACTATED RINGERS IV SOLN
INTRAVENOUS | Status: DC
  Administered 2015-12-11 (×3): via INTRAVENOUS

## 2015-12-11 MED ORDER — ALBUTEROL SULFATE (2.5 MG/3ML) 0.083% IN NEBU
INHALATION_SOLUTION | RESPIRATORY_TRACT | Status: AC
  Filled 2015-12-11: qty 3

## 2015-12-11 MED ORDER — SUGAMMADEX SODIUM 200 MG/2ML IV SOLN
INTRAVENOUS | Status: AC
  Filled 2015-12-11: qty 2

## 2015-12-11 MED ORDER — DEXAMETHASONE SODIUM PHOSPHATE 10 MG/ML IJ SOLN
INTRAMUSCULAR | Status: DC | PRN
  Administered 2015-12-11: 10 mg via INTRAVENOUS

## 2015-12-11 MED ORDER — LIDOCAINE HCL (CARDIAC) 20 MG/ML IV SOLN
INTRAVENOUS | Status: DC | PRN
  Administered 2015-12-11: 50 mg via INTRAVENOUS

## 2015-12-11 MED ORDER — EPINEPHRINE HCL 1 MG/ML IJ SOLN
INTRAMUSCULAR | Status: DC | PRN
  Administered 2015-12-11: 1 mg

## 2015-12-11 MED ORDER — ROCURONIUM BROMIDE 50 MG/5ML IV SOLN
INTRAVENOUS | Status: AC
  Filled 2015-12-11: qty 2

## 2015-12-11 MED ORDER — FENTANYL CITRATE (PF) 250 MCG/5ML IJ SOLN
INTRAMUSCULAR | Status: AC
  Filled 2015-12-11: qty 5

## 2015-12-11 MED ORDER — 0.9 % SODIUM CHLORIDE (POUR BTL) OPTIME
TOPICAL | Status: DC | PRN
  Administered 2015-12-11: 1000 mL

## 2015-12-11 MED ORDER — CHLORHEXIDINE GLUCONATE 4 % EX LIQD: 60.0000 mL | Freq: Once | CUTANEOUS | Status: DC

## 2015-12-11 MED ORDER — LIDOCAINE 2% (20 MG/ML) 5 ML SYRINGE
INTRAMUSCULAR | Status: AC
  Filled 2015-12-11: qty 5

## 2015-12-11 MED ORDER — MEPERIDINE HCL 25 MG/ML IJ SOLN: 6.2500 mg | INTRAMUSCULAR | Status: DC | PRN

## 2015-12-11 MED ORDER — ONDANSETRON HCL 4 MG/2ML IJ SOLN
INTRAMUSCULAR | Status: AC
  Filled 2015-12-11: qty 2

## 2015-12-11 MED ORDER — FENTANYL CITRATE (PF) 100 MCG/2ML IJ SOLN
INTRAMUSCULAR | Status: DC | PRN
  Administered 2015-12-11 (×2): 50 ug via INTRAVENOUS

## 2015-12-11 MED ORDER — EPHEDRINE SULFATE 50 MG/ML IJ SOLN
INTRAMUSCULAR | Status: DC | PRN
  Administered 2015-12-11: 10 mg via INTRAVENOUS

## 2015-12-11 MED ORDER — HYDROMORPHONE HCL 1 MG/ML IJ SOLN: 0.2500 mg | INTRAMUSCULAR | Status: DC | PRN

## 2015-12-11 MED ORDER — LACTATED RINGERS IV SOLN: INTRAVENOUS | Status: DC

## 2015-12-11 MED ORDER — PROPOFOL 10 MG/ML IV BOLUS
INTRAVENOUS | Status: AC
  Filled 2015-12-11: qty 20

## 2015-12-11 MED ORDER — PROCHLORPERAZINE MALEATE 10 MG PO TABS: 10.0000 mg | ORAL_TABLET | Freq: Four times a day (QID) | ORAL | Status: DC | PRN

## 2015-12-11 MED ORDER — PROPOFOL 10 MG/ML IV BOLUS
INTRAVENOUS | Status: DC | PRN
  Administered 2015-12-11: 80 mg via INTRAVENOUS

## 2015-12-11 MED ORDER — SUGAMMADEX SODIUM 500 MG/5ML IV SOLN
INTRAVENOUS | Status: DC | PRN
  Administered 2015-12-11: 100 mg via INTRAVENOUS

## 2015-12-11 MED ORDER — GLYCOPYRROLATE 0.2 MG/ML IJ SOLN
INTRAMUSCULAR | Status: DC | PRN
  Administered 2015-12-11: 0.4 mg via INTRAVENOUS
  Administered 2015-12-11: 0.2 mg via INTRAVENOUS

## 2015-12-11 MED ORDER — VANCOMYCIN HCL 1000 MG IV SOLR
1000.0000 mg | INTRAVENOUS | Status: DC | PRN
  Administered 2015-12-11: 1000 mg via INTRAVENOUS

## 2015-12-11 MED ORDER — DEXAMETHASONE SODIUM PHOSPHATE 10 MG/ML IJ SOLN
INTRAMUSCULAR | Status: AC
  Filled 2015-12-11: qty 1

## 2015-12-11 MED ORDER — LIDOCAINE-PRILOCAINE 2.5-2.5 % EX CREA
TOPICAL_CREAM | CUTANEOUS | Status: DC
Start: 2015-12-11 — End: 2016-11-07

## 2015-12-11 MED ORDER — NEOSTIGMINE METHYLSULFATE 10 MG/10ML IV SOLN
INTRAVENOUS | Status: DC | PRN
  Administered 2015-12-11: 3 mg via INTRAVENOUS
  Administered 2015-12-11: 1.5 mg via INTRAVENOUS

## 2015-12-11 MED ORDER — LIDOCAINE 2% (20 MG/ML) 5 ML SYRINGE
INTRAMUSCULAR | Status: AC
Start: 2015-12-11 — End: 2015-12-11
  Filled 2015-12-11: qty 5

## 2015-12-11 MED ORDER — PHENYLEPHRINE HCL 10 MG/ML IJ SOLN
INTRAMUSCULAR | Status: DC | PRN
  Administered 2015-12-11 (×2): 200 ug via INTRAVENOUS

## 2015-12-11 MED ORDER — ROCURONIUM BROMIDE 50 MG/5ML IV SOLN
INTRAVENOUS | Status: AC
  Filled 2015-12-11: qty 1

## 2015-12-11 MED ORDER — EPINEPHRINE HCL 1 MG/ML IJ SOLN
INTRAMUSCULAR | Status: AC
  Filled 2015-12-11: qty 1

## 2015-12-11 SURGICAL SUPPLY — 68 items
ADH SKN CLS APL DERMABOND .7 (GAUZE/BANDAGES/DRESSINGS) ×1
APPLIER CLIP LOGIC TI 5 (MISCELLANEOUS) IMPLANT
APR CLP MED LRG 33X5 (MISCELLANEOUS)
BALL CTTN LRG ABS STRL LF (GAUZE/BANDAGES/DRESSINGS)
BLADE SURG 15 STRL LF DISP TIS (BLADE) ×1 IMPLANT
BLADE SURG 15 STRL SS (BLADE) ×3
BRUSH CYTOL CELLEBRITY 1.5X140 (MISCELLANEOUS) ×2 IMPLANT
CANISTER SUCTION 2500CC (MISCELLANEOUS) ×6 IMPLANT
CLIP TI MEDIUM 6 (CLIP) IMPLANT
CONT SPEC 4OZ CLIKSEAL STRL BL (MISCELLANEOUS) ×13 IMPLANT
COTTONBALL LRG STERILE PKG (GAUZE/BANDAGES/DRESSINGS) IMPLANT
COVER DOME SNAP 22 D (MISCELLANEOUS) ×3 IMPLANT
COVER SURGICAL LIGHT HANDLE (MISCELLANEOUS) ×6 IMPLANT
COVER TABLE BACK 60X90 (DRAPES) ×3 IMPLANT
DERMABOND ADVANCED (GAUZE/BANDAGES/DRESSINGS) ×2
DERMABOND ADVANCED .7 DNX12 (GAUZE/BANDAGES/DRESSINGS) ×1 IMPLANT
DRAPE CHEST BREAST 15X10 FENES (DRAPES) ×3 IMPLANT
ELECT REM PT RETURN 9FT ADLT (ELECTROSURGICAL) ×3
ELECTRODE REM PT RTRN 9FT ADLT (ELECTROSURGICAL) ×1 IMPLANT
FILTER STRAW FLUID ASPIR (MISCELLANEOUS) IMPLANT
FORCEPS BIOP RJ4 1.8 (CUTTING FORCEPS) ×2 IMPLANT
GAUZE SPONGE 4X4 12PLY STRL (GAUZE/BANDAGES/DRESSINGS) ×3 IMPLANT
GAUZE SPONGE 4X4 16PLY XRAY LF (GAUZE/BANDAGES/DRESSINGS) ×3 IMPLANT
GLOVE SURG SIGNA 7.5 PF LTX (GLOVE) ×6 IMPLANT
GOWN STRL REUS W/ TWL LRG LVL3 (GOWN DISPOSABLE) ×1 IMPLANT
GOWN STRL REUS W/ TWL XL LVL3 (GOWN DISPOSABLE) ×2 IMPLANT
GOWN STRL REUS W/TWL LRG LVL3 (GOWN DISPOSABLE) ×3
GOWN STRL REUS W/TWL XL LVL3 (GOWN DISPOSABLE) ×6
HEMOSTAT SURGICEL 2X14 (HEMOSTASIS) IMPLANT
KIT BASIN OR (CUSTOM PROCEDURE TRAY) ×3 IMPLANT
KIT CLEAN ENDO COMPLIANCE (KITS) ×6 IMPLANT
KIT ROOM TURNOVER OR (KITS) ×6 IMPLANT
MARKER SKIN DUAL TIP RULER LAB (MISCELLANEOUS) ×3 IMPLANT
NDL BIOPSY TRANSBRONCH 21G (NEEDLE) IMPLANT
NDL BLUNT 18X1 FOR OR ONLY (NEEDLE) IMPLANT
NEEDLE 22X1 1/2 (OR ONLY) (NEEDLE) IMPLANT
NEEDLE BIOPSY TRANSBRONCH 21G (NEEDLE) IMPLANT
NEEDLE BLUNT 18X1 FOR OR ONLY (NEEDLE) IMPLANT
NEEDLE SONO TIP II EBUS (NEEDLE) ×3 IMPLANT
NS IRRIG 1000ML POUR BTL (IV SOLUTION) ×6 IMPLANT
OIL SILICONE PENTAX (PARTS (SERVICE/REPAIRS)) ×2 IMPLANT
PACK SURGICAL SETUP 50X90 (CUSTOM PROCEDURE TRAY) ×3 IMPLANT
PAD ARMBOARD 7.5X6 YLW CONV (MISCELLANEOUS) ×12 IMPLANT
PENCIL BUTTON HOLSTER BLD 10FT (ELECTRODE) ×3 IMPLANT
SPONGE INTESTINAL PEANUT (DISPOSABLE) IMPLANT
SUT SILK 2 0 (SUTURE)
SUT SILK 2-0 18XBRD TIE 12 (SUTURE) IMPLANT
SUT VIC AB 2-0 CT1 27 (SUTURE)
SUT VIC AB 2-0 CT1 TAPERPNT 27 (SUTURE) IMPLANT
SUT VIC AB 3-0 SH 18 (SUTURE) ×3 IMPLANT
SUT VICRYL 4-0 PS2 18IN ABS (SUTURE) ×3 IMPLANT
SWAB COLLECTION DEVICE MRSA (MISCELLANEOUS) IMPLANT
SYR 20CC LL (SYRINGE) ×3 IMPLANT
SYR 20ML ECCENTRIC (SYRINGE) ×5 IMPLANT
SYR 30ML SLIP (SYRINGE) ×2 IMPLANT
SYR 5ML LL (SYRINGE) ×3 IMPLANT
SYR 5ML LUER SLIP (SYRINGE) ×3 IMPLANT
SYR CONTROL 10ML LL (SYRINGE) IMPLANT
SYRINGE 10CC LL (SYRINGE) ×3 IMPLANT
TOWEL OR 17X24 6PK STRL BLUE (TOWEL DISPOSABLE) ×6 IMPLANT
TOWEL OR 17X26 10 PK STRL BLUE (TOWEL DISPOSABLE) ×3 IMPLANT
TRAP SPECIMEN MUCOUS 40CC (MISCELLANEOUS) ×3 IMPLANT
TUBE ANAEROBIC SPECIMEN COL (MISCELLANEOUS) IMPLANT
TUBE CONNECTING 12'X1/4 (SUCTIONS) ×1
TUBE CONNECTING 12X1/4 (SUCTIONS) ×2 IMPLANT
TUBE CONNECTING 20'X1/4 (TUBING) ×1
TUBE CONNECTING 20X1/4 (TUBING) ×2 IMPLANT
WATER STERILE IRR 1000ML POUR (IV SOLUTION) ×3 IMPLANT

## 2015-12-11 NOTE — Anesthesia Postprocedure Evaluation (Signed)
Anesthesia Post Note  Patient: Janet Fletcher  Procedure(s) Performed: Procedure(s) (LRB): VIDEO BRONCHOSCOPY WITH ENDOBRONCHIAL ULTRASOUND (N/A)  Patient location during evaluation: PACU Anesthesia Type: General Level of consciousness: awake and alert Pain management: pain level controlled Vital Signs Assessment: post-procedure vital signs reviewed and stable Respiratory status: spontaneous breathing, nonlabored ventilation, respiratory function stable and patient connected to nasal cannula oxygen Cardiovascular status: blood pressure returned to baseline and stable Postop Assessment: no signs of nausea or vomiting Anesthetic complications: no    Last Vitals:  Filed Vitals:   12/11/15 1415 12/11/15 1430  BP:  104/54  Pulse:  64  Temp:    Resp: 25     Last Pain:  Filed Vitals:   12/11/15 1431  PainSc: 0-No pain                 Effie Berkshire

## 2015-12-11 NOTE — Anesthesia Procedure Notes (Signed)
Procedure Name: Intubation Date/Time: 12/11/2015 10:07 AM Performed by: Judeth Cornfield T Pre-anesthesia Checklist: Patient identified, Emergency Drugs available, Timeout performed, Suction available and Patient being monitored Patient Re-evaluated:Patient Re-evaluated prior to inductionOxygen Delivery Method: Circle system utilized Preoxygenation: Pre-oxygenation with 100% oxygen Intubation Type: IV induction Ventilation: Oral airway inserted - appropriate to patient size and Mask ventilation without difficulty Laryngoscope Size: Mac and 3 Grade View: Grade I Tube type: Oral Tube size: 8.5 mm Number of attempts: 1 Airway Equipment and Method: Stylet Placement Confirmation: breath sounds checked- equal and bilateral,  CO2 detector,  positive ETCO2 and ETT inserted through vocal cords under direct vision Secured at: 22 cm Tube secured with: Tape Dental Injury: Teeth and Oropharynx as per pre-operative assessment  Difficulty Due To: Difficulty was unanticipated

## 2015-12-11 NOTE — Transfer of Care (Addendum)
Immediate Anesthesia Transfer of Care Note  Patient: Janet Fletcher  Procedure(s) Performed: Procedure(s): VIDEO BRONCHOSCOPY WITH ENDOBRONCHIAL ULTRASOUND (N/A)  Patient Location: PACU  Anesthesia Type:General  Level of Consciousness: awake and pateint uncooperative  Airway & Oxygen Therapy: Patient Spontanous Breathing and Patient connected to face mask oxygen  Post-op Assessment: Report given to RN  Post vital signs: Reviewed and stable  Last Vitals:  Filed Vitals:   12/11/15 0742  BP: 132/55  Pulse: 72  Temp: 36.5 C  Resp: 16    Last Pain: There were no vitals filed for this visit.    Patients Stated Pain Goal: 4 (16/10/96 0454)  Complications: No apparent anesthesia complications. Dr. Smith Robert called to pts BS in PACU to evaluate respiratory status.  Pt sitting up in bed, decrease breath sounds to RUL, neb ordered.

## 2015-12-11 NOTE — Progress Notes (Signed)
I reviewed patient pathology and imaging including PET.  The intrathoracic disease appears to be limited to a distribution that could safely be encompassed within a radiation port.  The patient may be a good candidate for concurrent chemo and radiation therapy.

## 2015-12-11 NOTE — Anesthesia Preprocedure Evaluation (Addendum)
Anesthesia Evaluation  Patient identified by MRN, date of birth, ID band Patient awake    Reviewed: Allergy & Precautions, NPO status , Patient's Chart, lab work & pertinent test results  Airway Mallampati: II  TM Distance: >3 FB Neck ROM: Full    Dental  (+) Upper Dentures, Lower Dentures, Dental Advisory Given   Pulmonary Current Smoker,    breath sounds clear to auscultation       Cardiovascular negative cardio ROS   Rhythm:Regular Rate:Normal     Neuro/Psych PSYCHIATRIC DISORDERS negative neurological ROS  negative psych ROS   GI/Hepatic negative GI ROS, Neg liver ROS,   Endo/Other  negative endocrine ROS  Renal/GU negative Renal ROS  negative genitourinary   Musculoskeletal negative musculoskeletal ROS (+)   Abdominal   Peds negative pediatric ROS (+)  Hematology negative hematology ROS (+)   Anesthesia Other Findings - HLD - Lung Cancer  Reproductive/Obstetrics negative OB ROS                           Lab Results  Component Value Date   WBC 5.5 08/20/2013   HGB 10.1* 08/20/2013   HCT 33.1* 08/20/2013   MCV 71.9* 08/20/2013   PLT 233 01/06/2013   Lab Results  Component Value Date   CREATININE 0.70 11/28/2015   BUN 12 09/29/2014   NA 143 09/29/2014   K 4.1 09/29/2014   CL 103 09/29/2014   CO2 22 09/29/2014   No results found for: INR, PROTIME  08/2015 EKG: SB  Anesthesia Physical Anesthesia Plan  ASA: II  Anesthesia Plan: General   Post-op Pain Management:    Induction: Intravenous  Airway Management Planned: Oral ETT  Additional Equipment: Arterial line  Intra-op Plan:   Post-operative Plan: Extubation in OR  Informed Consent: I have reviewed the patients History and Physical, chart, labs and discussed the procedure including the risks, benefits and alternatives for the proposed anesthesia with the patient or authorized representative who has indicated  his/her understanding and acceptance.   Dental advisory given  Plan Discussed with: CRNA  Anesthesia Plan Comments: (No SOB with laying flat. )       Anesthesia Quick Evaluation

## 2015-12-11 NOTE — Discharge Instructions (Signed)
Do not drive or engage in heavy physical activity for 24 hours  You may resume normal activities tomorrow  You may cough up small amounts of blood over the next few days  Call [phone] if you develop chest pain, shortness of breath, fever > 101F, or cough up more than 2 tablespoons of blood  Follow up with Dr. Whitney Muse

## 2015-12-11 NOTE — H&P (View-Only) (Signed)
PCP is Chevis Pretty, FNP Referring Provider is Penland, Kelby Fam, MD  Chief Complaint  Patient presents with  . Lung Mass    RULobe...RMLobe...CT CHEST 11/28/15, PET 12/05/15  . Mediastinal Mass    HPI: 80 year old woman sent for consultation for biopsy of the mediastinal mass.  Janet Fletcher is a 80 year old woman with a history of tobacco abuse (one half pack per day for over 50 years, 25+ pack years). Past medical history is otherwise unremarkable with only seasonal allergy, hyperlipidemia, and generalized anxiety disorder. She did have a small bowel resection of her child for about 50 years ago and has had bilateral cataract surgery.  She was in her usual state of health until about a month ago when she developed a persistent cough. She attributed this to sinus drainage. She was treated empirically for possible bronchitis, but her symptoms did not improve. She had a severe coughing spell and went to an urgent care. A chest x-ray was done and showed a "mass in her lung." A CT was done on 11/28/2015. It showed a large right hilar/mediastinal mass. A PET CT was done on 12/05/2015 which showed the mass was hypermetabolic. There were multiple lung nodules that were very poorly hypermetabolic.  She smokes about one cigarette a day currently. She complains of a cough did have some yellow sputum. She denies hemoptysis. She denies chest pain or shortness of breath. She has a poor appetite. She has lost about 20 pounds in the past 3 months. She still is working part-time.  Zubrod Score: At the time of surgery this patient's most appropriate activity status/level should be described as: '[x]'$     0    Normal activity, no symptoms '[]'$     1    Restricted in physical strenuous activity but ambulatory, able to do out light work '[]'$     2    Ambulatory and capable of self care, unable to do work activities, up and about >50 % of waking hours                              '[]'$     3    Only limited self care, in  bed greater than 50% of waking hours '[]'$     4    Completely disabled, no self care, confined to bed or chair '[]'$     5    Moribund  Past Medical History  Diagnosis Date  . Hyperlipidemia   . Allergy   . Osteopenia     Past Surgical History  Procedure Laterality Date  . Small intestine surgery    . Cataract extraction, bilateral Bilateral   . Minor excision ear canal cyst Left     30+ years ago    Family History  Problem Relation Age of Onset  . Cancer Mother   . Hypertension Father     Social History Social History  Substance Use Topics  . Smoking status: Current Some Day Smoker -- 0.05 packs/day for 50 years  . Smokeless tobacco: Never Used  . Alcohol Use: No    Current Outpatient Prescriptions  Medication Sig Dispense Refill  . simvastatin (ZOCOR) 40 MG tablet Take 1 tablet (40 mg total) by mouth at bedtime. 90 tablet 3  . fluticasone (FLONASE) 50 MCG/ACT nasal spray Place 2 sprays into both nostrils daily. (Patient not taking: Reported on 11/28/2015) 16 g 6   No current facility-administered medications for this visit.    Allergies  Allergen Reactions  . Acetaminophen Other (See Comments)    HEART RACES AWAY/ EXTRA STRENGTH TYLENOL  . Flagyl [Metronidazole Hcl]   . Penicillins     Review of Systems  Constitutional: Positive for appetite change and unexpected weight change (15 weight loss in 3 months). Negative for fever, chills, activity change and fatigue.  HENT: Negative for trouble swallowing and voice change.   Eyes: Negative for visual disturbance.  Respiratory: Positive for cough. Negative for shortness of breath and wheezing.   Cardiovascular: Negative for chest pain and palpitations.  Gastrointestinal: Negative for abdominal pain and blood in stool.  Endocrine: Negative for polydipsia and polyphagia.  Genitourinary: Negative for dysuria and difficulty urinating.  Musculoskeletal: Negative for myalgias and arthralgias.  Neurological: Negative for  syncope, weakness and numbness.  Hematological: Negative for adenopathy. Bruises/bleeds easily (Bruises easily, no excessive bleeding).  Psychiatric/Behavioral: Negative.   All other systems reviewed and are negative.   BP 114/66 mmHg  Pulse 73  Resp 16  Ht '5\' 4"'$  (1.626 m)  Wt 119 lb (53.978 kg)  BMI 20.42 kg/m2  SpO2 92% Physical Exam  Constitutional: She is oriented to person, place, and time. She appears well-developed and well-nourished. No distress.  HENT:  Head: Normocephalic and atraumatic.  Mouth/Throat: No oropharyngeal exudate.  Eyes: Conjunctivae and EOM are normal. No scleral icterus.  Neck: Neck supple. No thyromegaly present.  Cardiovascular: Normal rate, regular rhythm and normal heart sounds.  Exam reveals no gallop and no friction rub.   No murmur heard. Pulmonary/Chest: Effort normal and breath sounds normal. No respiratory distress. She has no wheezes. She has no rales.  Abdominal: Soft. She exhibits no distension. There is no tenderness.  Musculoskeletal: She exhibits no edema.  Lymphadenopathy:    She has no cervical adenopathy.  Neurological: She is alert and oriented to person, place, and time. No cranial nerve deficit.  No focal motor deficit  Skin: Skin is warm and dry.  Vitals reviewed.    Diagnostic Tests: CT CHEST WITH CONTRAST  TECHNIQUE: Multidetector CT imaging of the chest was performed during intravenous contrast administration.  CONTRAST: 65m ISOVUE-300 IOPAMIDOL (ISOVUE-300) INJECTION 61%  COMPARISON: None.  FINDINGS: 3 mm right thyroid hypodensity.  Large middle mediastinal mass is anterior to the trachea extending into the AP window into the right paratracheal regions. This causes marked narrowing of the SVC.  2.9 x 2.0 cm right hilar mass. This is probably in direct continuity with the larger middle mediastinal mass. This can be appreciated on coronal reformats. See image 53 of series 4.  There is a complex  branching mass in the anterior inferior right upper lobe on image 75 measuring 2.4 x 3.6 cm. An anterior segmental bronchus is occluded at the level of this mass. See image 73 of series 3.  There is also a small spiculated anterior right upper lobe mass on image 58 measuring 9 mm. Vague focal ground-glass opacity in the right upper lobe on image 65 measures approximately 10 mm. Dependent atelectasis at the posterior right lung base. No left lung masses. Significant emphysema throughout the lungs with a prep collection for the lung apices is present.  No pneumothorax or pleural effusion.  Images of the upper abdomen demonstrate a 7 mm hypodensity in the lateral segment of the left lobe of the liver anteriorly. There is also prominent calcified plaque in the juxtarenal aorta.  IMPRESSION: Large anterior mediastinal mass in continuity with a right hilar mass worrisome for malignancy. This does cause narrowing of  the SVC.  There are abnormal parenchymal densities in the right upper lobe. There is a 9 mm spiculated nodule, large complex of branching 3.6 cm mass, and a vague 10 mm ground-glass area. Again, malignancy is suspected.  Nonspecific hypodensity in the liver.  Tiny hypodensity in the right lobe of the thyroid gland. Ultrasound can be performed.   Electronically Signed  By: Marybelle Killings M.D.  On: 11/28/2015 10:58  NUCLEAR MEDICINE PET SKULL BASE TO THIGH  TECHNIQUE: Thirteen mCi F-18 FDG was injected intravenously. Full-ring PET imaging was performed from the skull base to thigh after the radiotracer. CT data was obtained and used for attenuation correction and anatomic localization.  FASTING BLOOD GLUCOSE: Value: 83 mg/dl  COMPARISON: CT 11/28/2015  FINDINGS: NECK  Hypermetabolic nodules in the central RIGHT upper lobe measuring approximately 1 cm each (image 99, series 3) with intense metabolic activity (SUV max 11.9). Small nodule in the  RIGHT upper lobe medially measuring 10 mm on image 90, series 3 has mild metabolic activity.  The large RIGHT paratracheal mass measures 7 cm with intense peripheral metabolic activity with SUV max equal 12.9. This metabolic activity extends to the high RIGHT paratracheal location. Mass surrounds the SVC.  No LEFT lung hypermetabolic nodules.  CHEST  No hypermetabolic mediastinal or hilar nodes. No suspicious pulmonary nodules on the CT scan.  ABDOMEN/PELVIS  No abnormal hypermetabolic activity within the liver, pancreas, adrenal glands, or spleen. No hypermetabolic lymph nodes in the abdomen or pelvis. There is dense sclerosis of the aorta which extends into the lumen. Uterus normal.  SKELETON  No focal hypermetabolic activity to suggest skeletal metastasis.  IMPRESSION: 1. Large intensely hypermetabolic paratracheal mass most consistent with metastatic small cell lung carcinoma. 2. Perihilar nodular densities in the RIGHT middle lobe are likely pulmonary metastasis. 3. Mild metabolic activity associated are RIGHT upper lobe nodule may represent primary lesion. 4. No evidence of distant metastatic disease. 5. Dense sclerosis of the aorta is extends into the lumen of the infrarenal abdominal aorta LEFT common iliac artery.   Electronically Signed  By: Suzy Bouchard M.D.  On: 12/05/2015 17:19  I personally reviewed the CT and PET CT and concur with the findings as noted above  Impression: 80 year old woman with a history tobacco abuse who has a large right hilar and mediastinal mass along with multiple right-sided lung nodules. This is highly suspicious for a new primary bronchogenic carcinoma. It could be either small cell (most likely) or non-small cell. Lymphoma is also within the differential diagnosis. She needs a biopsy for an diagnostic purposes in order to guide therapy. The mass is not surgically resectable.  I reviewed the CT and PET CT with  the patient and her daughters. I propose that we proceed with bronchoscopy, endobronchial ultrasound, and possible mediastinoscopy for diagnostic purposes. They understand this would be done under general anesthesia on an outpatient basis. I reviewed the indications, risks, benefits, and alternatives. They understand the risks include are not limited to death, MI, DVT, PE, bleeding, possibly transfusion, possible need for sternotomy, stroke, recurrent nerve injury, esophageal injury, infection, and pneumothorax.  She accepts the risks and wishes to proceed as soon as possible.  Plan: Bronchoscopy, endobronchial ultrasound, possible mediastinoscopy on Monday 12/11/2015  Melrose Nakayama, MD [facility] [phone]

## 2015-12-11 NOTE — Brief Op Note (Signed)
12/11/2015  11:50 AM  PATIENT:  Janet Fletcher  80 y.o. female  PRE-OPERATIVE DIAGNOSIS:  MEDIASTINAL MASS  POST-OPERATIVE DIAGNOSIS:  MEDIASTINAL MASS- probable small cell lung cancer  PROCEDURE:  Procedure(s): VIDEO BRONCHOSCOPY WITH ENDOBRONCHIAL ULTRASOUND (N/A)  Biopsies, brushings and mediastinal lymph node aspirations  SURGEON:  Surgeon(s) and Role:    * Melrose Nakayama, MD - Primary  ANESTHESIA:   general  EBL:  Total I/O In: 1000 [I.V.:1000] Out: -   BLOOD ADMINISTERED:none  DRAINS: none   LOCAL MEDICATIONS USED:  NONE  SPECIMEN:  Source of Specimen:  tracheal biopsies/ brushings, RUL brushings, Level 4R adn 7 lymph node apsirations  DISPOSITION OF SPECIMEN:  PATHOLOGY  PLAN OF CARE: Discharge to home after PACU  PATIENT DISPOSITION:  PACU - hemodynamically stable.   Delay start of Pharmacological VTE agent (>24hrs) due to surgical blood loss or risk of bleeding: not applicable  Brushings from RUL- + malignancy, likely small cell carcinoma

## 2015-12-11 NOTE — Interval H&P Note (Signed)
History and Physical Interval Note:  12/11/2015 9:54 AM  Janet Fletcher  has presented today for surgery, with the diagnosis of MEDIASTINAL MASS  The various methods of treatment have been discussed with the patient and family. After consideration of risks, benefits and other options for treatment, the patient has consented to  Procedure(s): VIDEO BRONCHOSCOPY WITH ENDOBRONCHIAL ULTRASOUND (N/A) MEDIASTINOSCOPY (N/A) as a surgical intervention .  The patient's history has been reviewed, patient examined, no change in status, stable for surgery.  I have reviewed the patient's chart and labs.  Questions were answered to the patient's satisfaction.     Melrose Nakayama

## 2015-12-11 NOTE — Op Note (Signed)
Janet Fletcher, Janet Fletcher               ACCOUNT NO.:  [id number]  MEDICAL RECORD NO.:  [id number]  LOCATION:  [facility]                         FACILITY:  [facility]  PHYSICIAN:  Revonda Standard. Roxan Hockey, M.D.DATE OF BIRTH:  Dec 24, 1935  DATE OF PROCEDURE:  12/11/2015 DATE OF DISCHARGE:  12/11/2015                              OPERATIVE REPORT   PREOPERATIVE DIAGNOSIS:  Mediastinal mass.  POSTOPERATIVE DIAGNOSIS:  Mediastinal mass, probable small-cell lung cancer.  PROCEDURE:  Video bronchoscopy with biopsies and brushings.  Endobronchial ultrasound with mediastinal lymph node aspirations.  SURGEON:  Revonda Standard. Roxan Hockey, M.D.  ASSISTANT:  None.  ANESTHESIA:  General.  FINDINGS:   Edema in the distal trachea.  Brushings showed bronchial epithelial cells.  Multiple enlarged lymph nodes.   Brushings from right upper lobe bronchus showed malignancy- likely small cell carcinoma.  CLINICAL NOTE:  Janet Fletcher is a 80 year old woman with a history of tobacco abuse who recently developed a persistent cough.  A chest x-ray showed a mass in the lung and a CT showed a large right hilar mass extending into the mediastinum, this mass was hypermetabolic on PET-CT. There were also multiple lung nodules in the right lung.  She was advised to undergo bronchoscopy, endobronchial ultrasound, and possible mediastinoscopy for diagnostic purposes.  The indications, risks, benefits, and alternatives were discussed in detail with the patient. She understood and accepted the risks and agreed to proceed.  OPERATIVE NOTE:  Janet Fletcher was brought to the operating room on Dec 11, 2015.  She was given intravenous antibiotics.  She was anesthetized and intubated.  Flexible fiberoptic bronchoscopy was performed via the endotracheal tube.  There were no endobronchial lesions seen.  There was edema of the distal tracheal wall.  Brushings were performed of this area and biopsies were taken and these were sent to pathology.   No endobronchial lesion was seen in the right upper lobe bronchus but multiple brushings were taken from that area as well and sent with the tracheal specimens.  While awaiting those results, the endobronchial ultrasound probe was advanced.  There was a large mediastinal mass visible consistent with 4R lymph nodes.  Aspirations were performed of this mass.  With each aspiration, the needle was advanced into the lymph node with ultrasound visualization.  Suction was applied and then 10-12 passes were made with the needle while visualizing with ultrasound.  The specimens were placed onto slides and into cytologic fluid for permanent cytology. Multiple aspirations were performed with the node.  The bronchoscope then was advanced to the carina and an enlarged subcarinal node was noted, although it was not as large as the 4R node. Multiple aspirations were performed of this node as well.  The brushings from the right upper lobe bronchus came back positive for malignancy probably small-cell carcinoma.  No further sampling was indicated.  A final inspection was made with the bronchoscope and there was no ongoing bleeding.  The bronchoscope was removed.  The patient was extubated in the operating room and taken to the postanesthetic care unit in good condition.     Revonda Standard Roxan Hockey, M.D.     SCH/MEDQ  D:  12/11/2015  T:  12/11/2015  Job:  [id number] 

## 2015-12-11 NOTE — Patient Instructions (Signed)
[place]  CHEMOTHERAPY INSTRUCTIONS   Premeds:   Aloxi - for nausea/vomiting prevention/reduction. Emend - for nausea/vomiting prevention/reduction. Dexamethasone - steroid - given to reduce the risk of you having an allergic type reaction to the chemotherapy. Dex can cause you to feel energized, nervous/anxious/jittery, make you have trouble sleeping, and/or make you feel hot/flushed in the face/neck and/or look pink/red in the face/neck. These side effects will pass as the Dex wears off. (takes 30 minutes to infuse). All 3 nausea meds will be given on Day 1. On Days 2-3 you will only receive the Dexamethasone.   Cisplatin - this medication is hard on your kidneys. This is why we give you a large amount of fluids through your IV while you are here getting chemo. We also need you drinking 64 oz of fluid (preferably water/decaff fluids) everyday. Drink more if you can. This will help keep your kidneys flushed. This can also cause acute and/or delayed nausea/vomiting. You must take your nausea meds as prescribed if you get nauseated. Do not wait until you start vomiting. This med can also cause peripheral neuropathy (numbness/tingling/burning in hands/fingers/feet/toes). Let us know if this develops so that we can monitor it and treat if necessary. (Takes 1 hour to infuse) You will receive this drug on Day 1.   You will receive 1 liter of fluid before Cisplatin and 1 liter of fluid after Cisplatin to flush your kidneys.  VP16 (Etoposide) - this medication can lower your blood pressure. We will monitor this during chemo. Bone marrow suppression (lowers white blood cells (fight infection), lowers red blood cells (make up your blood), lower platelets (help blood to clot). Hair loss, loss of appetite. (takes 1 hour to infuse) You will receive this on Days 1-3  Neulasta - this medication is not chemo but being given because you have had chemo. It is usually given 27 hours after the completion  of chemotherapy. This medication works by boosting your bone marrow's supply of white blood cells. White blood cells are what protect our bodies against infection. The medication is given in the form of a subcutaneous injection. It is given in the fatty tissue of your abdomen. It is a short needle. The major side effect of this medication is bone or muscle pain. The drug of choice to relieve or lessen the pain is Aleve or Ibuprofen. If a physician has ever told you not to take Aleve or Ibuprofen - then don't take it. You should then take Tylenol/acetaminophen. Take either medication as the bottle directs you to. The level of pain you experience as a result of this injection can range from none, to mild or moderate, or severe. Please let us know if you develop moderate or severe bone pain.    POTENTIAL SIDE EFFECTS OF TREATMENT:  Increased Susceptibility to Infection, Vomiting, Constipation, Hair Thinning, Changes in Character of Skin and Nails (brittleness, dryness,etc.), Bone Marrow Suppression, Complete Hair Loss, Nausea, Diarrhea, Sun Sensitivity and Mouth Sores   SELF IMAGE NEEDS AND REFERRALS MADE:  Obtain hair accessories as soon as possible (wigs, scarves, turbans,caps,etc.)  Referral to Look Good, Feel Better consultant - paper given   EDUCATIONAL MATERIALS GIVEN AND REVIEWED:  Chemotherapy and You booklet  Specific Instructions Sheets: Cisplatin, Etoposide, Neulasta, Aloxi, Emend, Dexamethasone, EMLA cream, Compazine, Zofran   SELF CARE ACTIVITIES WHILE ON CHEMOTHERAPY:  Increase your fluid intake 48 hours prior to treatment and drink at least 2 quarts per day after treatment., No alcohol intake., No aspirin  or other medications unless approved by your oncologist., Eat foods that are light and easy to digest., Eat foods at cold or room temperature., No fried, fatty, or spicy foods immediately before or after treatment., Have teeth cleaned professionally before starting treatment. Keep  dentures and partial plates clean., Use soft toothbrush and do not use mouthwashes that contain alcohol. Biotene is a good mouthwash that is available at most pharmacies or may be ordered by calling [phone]., Use warm salt water gargles (1 teaspoon salt per 1 quart warm water) before and after meals and at bedtime. Or you may rinse with 2 tablespoons of three -percent hydrogen peroxide mixed in eight ounces of water., Always use sunscreen with SPF (Sun Protection Factor) of 50 or higher., Use your nausea medication as directed to prevent nausea., Use your stool softener or laxative as directed to prevent constipation. and Use your anti-diarrheal medication as directed to stop diarrhea.    Please wash your hands for at least 30 seconds using warm soapy water. Handwashing is the #1 way to prevent the spread of germs. Stay away from sick people or people who are getting over a cold. If you develop respiratory systems such as green/yellow mucus production or productive cough or persistent cough let us know and we will see if you need an antibiotic. It is a good idea to keep a pair of gloves on when going into grocery stores/[employer] to decrease your risk of coming into contact with germs on the carts, etc. Carry alcohol hand gel with you at all times and use it frequently if out in public. All foods need to be cooked thoroughly. No raw foods. No medium or undercooked meats, eggs. If your food is cooked medium well, it does not need to be hot pink or saturated with bloody liquid at all. Vegetables and fruits need to be washed/rinsed under the faucet with a dish detergent before being consumed. You can eat raw fruits and vegetables unless we tell you otherwise but it would be best if you cooked them or bought frozen. Do not eat off of salad bars or hot bars unless you really trust the cleanliness of the restaurant. If you need dental work, please let Dr. Whitney Muse know before you go for your appointment so that we  can coordinate the best possible time for you in regards to your chemo regimen. You need to also let your dentist know that you are actively taking chemo. We may need to do labs prior to your dental appointment. We also want your bowels moving at least every other day. If this is not happening, we need to know so that we can get you on a bowel regimen to help you go. If you are going to have sex, your partner must wear a condom. This is to protect your partner from potential chemotherapy exposure.   MEDICATIONS:  You have been given prescriptions for the following medications:   Zofran/Ondansetron '8mg'$  tablet. Take 1 tablet every 8 hours as needed for nausea/vomiting. (#1 nausea med to take, this can constipate)   Compazine/Prochlorperazine '10mg'$  tablet. Take 1 tablet every 6 hours as needed for nausea/vomiting. (#2 nausea med to take, this can make you sleepy)   EMLA cream. Apply a quarter size amount to port site 1 hour prior to chemo. Do not rub in. Cover with plastic wrap.    Over-the-Counter Meds:  Miralax 1 capful in 8 oz of fluid daily. May increase to two times a day if needed. This  is a stool softener. If this doesn't work proceed you can add:   Senokot S - start with 1 tablet two times a day and increase to 4 tablets two times a day if needed. (total of 8 tablets in a 24 hour period). This is a stimulant laxative.   Call us if this does not help your bowels move.   Imodium '2mg'$  capsule. Take 2 capsules after the 1st loose stool and then 1 capsule every 2 hours until you go a total of 12 hours without having a loose stool. Call the [facility] if loose stools continue. If diarrhea occurs @ bedtime, take 2 capsules @ bedtime. Then take 2 capsules every 4 hours until morning. Call [facility].     SYMPTOMS TO REPORT AS SOON AS POSSIBLE AFTER TREATMENT:  FEVER GREATER THAN 100.5 F  CHILLS WITH OR WITHOUT FEVER  NAUSEA AND VOMITING THAT IS NOT CONTROLLED WITH YOUR NAUSEA MEDICATION   UNUSUAL SHORTNESS OF BREATH  UNUSUAL BRUISING OR BLEEDING  TENDERNESS IN MOUTH AND THROAT WITH OR WITHOUT PRESENCE OF ULCERS  URINARY PROBLEMS  BOWEL PROBLEMS UNUSUAL RASH  Wear comfortable clothing and clothing appropriate for easy access to any Portacath or PICC line. Let us know if there is anything that we can do to make your therapy better!   I have been informed and understand all of the instructions given to me and have received a copy. I have been instructed to call the clinic [phone] or my family physician as soon as possible for continued medical care, if indicated. I do not have any more questions at this time but understand that I may call the [facility] or the Patient Navigator at [phone] during office hours should I have questions or need assistance in obtaining follow-up care.    Cisplatin injection What is this medicine? CISPLATIN (SIS pla tin) is a chemotherapy drug. It targets fast dividing cells, like cancer cells, and causes these cells to die. This medicine is used to treat many types of cancer like bladder, ovarian, and testicular cancers. This medicine may be used for other purposes; ask your health care provider or pharmacist if you have questions. What should I tell my health care provider before I take this medicine? They need to know if you have any of these conditions: -blood disorders -hearing problems -kidney disease -recent or ongoing radiation therapy -an unusual or allergic reaction to cisplatin, carboplatin, other chemotherapy, other medicines, foods, dyes, or preservatives -pregnant or trying to get pregnant -breast-feeding How should I use this medicine? This drug is given as an infusion into a vein. It is administered in a hospital or clinic by a specially trained health care professional. Talk to your pediatrician regarding the use of this medicine in children. Special care may be needed. Overdosage: If you think you have taken too  much of this medicine contact a poison control center or emergency room at once. NOTE: This medicine is only for you. Do not share this medicine with others. What if I miss a dose? It is important not to miss a dose. Call your doctor or health care professional if you are unable to keep an appointment. What may interact with this medicine? -dofetilide -foscarnet -medicines for seizures -medicines to increase blood counts like filgrastim, pegfilgrastim, sargramostim -probenecid -pyridoxine used with altretamine -rituximab -some antibiotics like amikacin, gentamicin, neomycin, polymyxin B, streptomycin, tobramycin -sulfinpyrazone -vaccines -zalcitabine Talk to your doctor or health care professional before taking any of these medicines: -  acetaminophen -aspirin -ibuprofen -ketoprofen -naproxen This list may not describe all possible interactions. Give your health care provider a list of all the medicines, herbs, non-prescription drugs, or dietary supplements you use. Also tell them if you smoke, drink alcohol, or use illegal drugs. Some items may interact with your medicine. What should I watch for while using this medicine? Your condition will be monitored carefully while you are receiving this medicine. You will need important blood work done while you are taking this medicine. This drug may make you feel generally unwell. This is not uncommon, as chemotherapy can affect healthy cells as well as cancer cells. Report any side effects. Continue your course of treatment even though you feel ill unless your doctor tells you to stop. In some cases, you may be given additional medicines to help with side effects. Follow all directions for their use. Call your doctor or health care professional for advice if you get a fever, chills or sore throat, or other symptoms of a cold or flu. Do not treat yourself. This drug decreases your body's ability to fight infections. Try to avoid being around people  who are sick. This medicine may increase your risk to bruise or bleed. Call your doctor or health care professional if you notice any unusual bleeding. Be careful brushing and flossing your teeth or using a toothpick because you may get an infection or bleed more easily. If you have any dental work done, tell your dentist you are receiving this medicine. Avoid taking products that contain aspirin, acetaminophen, ibuprofen, naproxen, or ketoprofen unless instructed by your doctor. These medicines may hide a fever. Do not become pregnant while taking this medicine. Women should inform their doctor if they wish to become pregnant or think they might be pregnant. There is a potential for serious side effects to an unborn child. Talk to your health care professional or pharmacist for more information. Do not breast-feed an infant while taking this medicine. Drink fluids as directed while you are taking this medicine. This will help protect your kidneys. Call your doctor or health care professional if you get diarrhea. Do not treat yourself. What side effects may I notice from receiving this medicine? Side effects that you should report to your doctor or health care professional as soon as possible: -allergic reactions like skin rash, itching or hives, swelling of the face, lips, or tongue -signs of infection - fever or chills, cough, sore throat, pain or difficulty passing urine -signs of decreased platelets or bleeding - bruising, pinpoint red spots on the skin, black, tarry stools, nosebleeds -signs of decreased red blood cells - unusually weak or tired, fainting spells, lightheadedness -breathing problems -changes in hearing -gout pain -low blood counts - This drug may decrease the number of white blood cells, red blood cells and platelets. You may be at increased risk for infections and bleeding. -nausea and vomiting -pain, swelling, redness or irritation at the injection site -pain, tingling,  numbness in the hands or feet -problems with balance, movement -trouble passing urine or change in the amount of urine Side effects that usually do not require medical attention (report to your doctor or health care professional if they continue or are bothersome): -changes in vision -loss of appetite -metallic taste in the mouth or changes in taste This list may not describe all possible side effects. Call your doctor for medical advice about side effects. You may report side effects to FDA at 1-800-FDA-1088. Where should I keep my medicine? This drug  is given in a hospital or clinic and will not be stored at home. NOTE: This sheet is a summary. It may not cover all possible information. If you have questions about this medicine, talk to your doctor, pharmacist, or health care provider.    2016, Elsevier/[employer] Standard. (2007-10-13 14:40:54) Etoposide, VP-16 injection What is this medicine? ETOPOSIDE, VP-16 (e toe POE side) is a chemotherapy drug. It is used to treat testicular cancer, lung cancer, and other cancers. This medicine may be used for other purposes; ask your health care provider or pharmacist if you have questions. What should I tell my health care provider before I take this medicine? They need to know if you have any of these conditions: -infection -kidney disease -low blood counts, like low white cell, platelet, or red cell counts -an unusual or allergic reaction to etoposide, other chemotherapeutic agents, other medicines, foods, dyes, or preservatives -pregnant or trying to get pregnant -breast-feeding How should I use this medicine? This medicine is for infusion into a vein. It is administered in a hospital or clinic by a specially trained health care professional. Talk to your pediatrician regarding the use of this medicine in children. Special care may be needed. Overdosage: If you think you have taken too much of this medicine contact a poison control center or  emergency room at once. NOTE: This medicine is only for you. Do not share this medicine with others. What if I miss a dose? It is important not to miss your dose. Call your doctor or health care professional if you are unable to keep an appointment. What may interact with this medicine? -aspirin -certain medications for seizures like carbamazepine, phenobarbital, phenytoin, valproic acid -cyclosporine -levamisole -warfarin This list may not describe all possible interactions. Give your health care provider a list of all the medicines, herbs, non-prescription drugs, or dietary supplements you use. Also tell them if you smoke, drink alcohol, or use illegal drugs. Some items may interact with your medicine. What should I watch for while using this medicine? Visit your doctor for checks on your progress. This drug may make you feel generally unwell. This is not uncommon, as chemotherapy can affect healthy cells as well as cancer cells. Report any side effects. Continue your course of treatment even though you feel ill unless your doctor tells you to stop. In some cases, you may be given additional medicines to help with side effects. Follow all directions for their use. Call your doctor or health care professional for advice if you get a fever, chills or sore throat, or other symptoms of a cold or flu. Do not treat yourself. This drug decreases your body's ability to fight infections. Try to avoid being around people who are sick. This medicine may increase your risk to bruise or bleed. Call your doctor or health care professional if you notice any unusual bleeding. Be careful brushing and flossing your teeth or using a toothpick because you may get an infection or bleed more easily. If you have any dental work done, tell your dentist you are receiving this medicine. Avoid taking products that contain aspirin, acetaminophen, ibuprofen, naproxen, or ketoprofen unless instructed by your doctor. These  medicines may hide a fever. Do not become pregnant while taking this medicine or for at least 6 months after stopping it. Women should inform their doctor if they wish to become pregnant or think they might be pregnant. Women of child-bearing potential will need to have a negative pregnancy test before starting this medicine.  There is a potential for serious side effects to an unborn child. Talk to your health care professional or pharmacist for more information. Do not breast-feed an infant while taking this medicine. Men must use a latex condom during sexual contact with a woman while taking this medicine and for at least 4 months after stopping it. A latex condom is needed even if you have had a vasectomy. Contact your doctor right away if your partner becomes pregnant. Do not donate sperm while taking this medicine and for at least 4 months after you stop taking this medicine. Men should inform their doctors if they wish to father a child. This medicine may lower sperm counts. What side effects may I notice from receiving this medicine? Side effects that you should report to your doctor or health care professional as soon as possible: -allergic reactions like skin rash, itching or hives, swelling of the face, lips, or tongue -low blood counts - this medicine may decrease the number of white blood cells, red blood cells and platelets. You may be at increased risk for infections and bleeding. -signs of infection - fever or chills, cough, sore throat, pain or difficulty passing urine -signs of decreased platelets or bleeding - bruising, pinpoint red spots on the skin, black, tarry stools, blood in the urine -signs of decreased red blood cells - unusually weak or tired, fainting spells, lightheadedness -breathing problems -changes in vision -mouth or throat sores or ulcers -pain, redness, swelling or irritation at the injection site -pain, tingling, numbness in the hands or feet -redness, blistering,  peeling or loosening of the skin, including inside the mouth -seizures -vomiting Side effects that usually do not require medical attention (report to your doctor or health care professional if they continue or are bothersome): -diarrhea -hair loss -loss of appetite -nausea -stomach pain This list may not describe all possible side effects. Call your doctor for medical advice about side effects. You may report side effects to FDA at 1-800-FDA-1088. Where should I keep my medicine? This drug is given in a hospital or clinic and will not be stored at home. NOTE: This sheet is a summary. It may not cover all possible information. If you have questions about this medicine, talk to your doctor, pharmacist, or health care provider.    2016, Elsevier/Gold Standard. (2014-03-03 12:32:50) Palonosetron Injection What is this medicine? PALONOSETRON (pal oh NOE se tron) is used to prevent nausea and vomiting caused by chemotherapy. It also helps prevent delayed nausea and vomiting that may occur a few days after your treatment. This medicine may be used for other purposes; ask your health care provider or pharmacist if you have questions. What should I tell my health care provider before I take this medicine? They need to know if you have any of these conditions: -an unusual or allergic reaction to palonosetron, dolasetron, granisetron, ondansetron, other medicines, foods, dyes, or preservatives -pregnant or trying to get pregnant -breast-feeding How should I use this medicine? This medicine is for infusion into a vein. It is given by a health care professional in a hospital or clinic setting. Talk to your pediatrician regarding the use of this medicine in children. While this drug may be prescribed for children as young as 1 month for selected conditions, precautions do apply. Overdosage: If you think you have taken too much of this medicine contact a poison control center or emergency room at  once. NOTE: This medicine is only for you. Do not share this medicine with others.  What if I miss a dose? This does not apply. What may interact with this medicine? -certain medicines for depression, anxiety, or psychotic disturbances -fentanyl -linezolid -MAOIs like Carbex, Eldepryl, Marplan, Nardil, and Parnate -methylene blue (injected into a vein) -tramadol This list may not describe all possible interactions. Give your health care provider a list of all the medicines, herbs, non-prescription drugs, or dietary supplements you use. Also tell them if you smoke, drink alcohol, or use illegal drugs. Some items may interact with your medicine. What should I watch for while using this medicine? Your condition will be monitored carefully while you are receiving this medicine. What side effects may I notice from receiving this medicine? Side effects that you should report to your doctor or health care professional as soon as possible: -allergic reactions like skin rash, itching or hives, swelling of the face, lips, or tongue -breathing problems -confusion -dizziness -fast, irregular heartbeat -fever and chills -loss of balance or coordination -seizures -sweating -swelling of the hands and feet -tremors -unusually weak or tired Side effects that usually do not require medical attention (report to your doctor or health care professional if they continue or are bothersome): -constipation or diarrhea -headache This list may not describe all possible side effects. Call your doctor for medical advice about side effects. You may report side effects to FDA at 1-800-FDA-1088. Where should I keep my medicine? This drug is given in a hospital or clinic and will not be stored at home. NOTE: This sheet is a summary. It may not cover all possible information. If you have questions about this medicine, talk to your doctor, pharmacist, or health care provider.    2016, [employer] Standard.  (2013-05-14 10:38:36) Fosaprepitant injection What is this medicine? FOSAPREPITANT (fos ap RE pi tant) is used together with other medicines to prevent nausea and vomiting caused by cancer treatment (chemotherapy). This medicine may be used for other purposes; ask your health care provider or pharmacist if you have questions. What should I tell my health care provider before I take this medicine? They need to know if you have any of these conditions: -liver disease -an unusual or allergic reaction to fosaprepitant, aprepitant, medicines, foods, dyes, or preservatives -pregnant or trying to get pregnant -breast-feeding How should I use this medicine? This medicine is for injection into a vein. It is given by a health care professional in a hospital or clinic setting. Talk to your pediatrician regarding the use of this medicine in children. Special care may be needed. Overdosage: If you think you have taken too much of this medicine contact a poison control center or emergency room at once. NOTE: This medicine is only for you. Do not share this medicine with others. What if I miss a dose? This does not apply. What may interact with this medicine? Do not take this medicine with any of these medicines: -cisapride -flibanserin -lomitapide -pimozide This medicine may also interact with the following medications: -diltiazem -female hormones, like estrogens or progestins and birth control pills -medicines for fungal infections like ketoconazole and itraconazole -medicines for HIV -medicines for seizures or to control epilepsy like carbamazepine or phenytoin -medicines used for sleep or anxiety disorders like alprazolam, diazepam, or midazolam -nefazodone -paroxetine -ranolazine -rifampin -some chemotherapy medications like etoposide, ifosfamide, vinblastine, vincristine -some antibiotics like clarithromycin, erythromycin, troleandomycin -steroid medicines like dexamethasone or  methylprednisolone -tolbutamide -warfarin This list may not describe all possible interactions. Give your health care provider a list of all the medicines, herbs, non-prescription drugs, or  dietary supplements you use. Also tell them if you smoke, drink alcohol, or use illegal drugs. Some items may interact with your medicine. What should I watch for while using this medicine? Do not take this medicine if you already have nausea and vomiting. Ask your health care provider what to do if you already have nausea. Birth control pills and other methods of hormonal contraception (for example, IUD or patch) may not work properly while you are taking this medicine. Use an extra method of birth control during treatment and for 1 month after your last dose of fosaprepitant. This medicine should not be used continuously for a long time. Visit your doctor or health care professional for regular check-ups. This medicine may change your liver function blood test results. What side effects may I notice from receiving this medicine? Side effects that you should report to your doctor or health care professional as soon as possible: -allergic reactions like skin rash, itching or hives, swelling of the face, lips, or tongue -breathing problems -changes in heart rhythm -high or low blood pressure -pain, redness, or irritation at site where injected -rectal bleeding -serious dizziness or disorientation, confusion -sharp or severe stomach pain -sharp pain in your leg Side effects that usually do not require medical attention (report to your doctor or health care professional if they continue or are bothersome): -constipation or diarrhea -hair loss -headache -hiccups -loss of appetite -nausea -upset stomach -tiredness This list may not describe all possible side effects. Call your doctor for medical advice about side effects. You may report side effects to FDA at 1-800-FDA-1088. Where should I keep my  medicine? This drug is given in a hospital or clinic and will not be stored at home. NOTE: This sheet is a summary. It may not cover all possible information. If you have questions about this medicine, talk to your doctor, pharmacist, or health care provider.    2016, [employer]. (2014-08-24 10:45:34) Dexamethasone injection What is this medicine? DEXAMETHASONE (dex a METH a sone) is a corticosteroid. It is used to treat inflammation of the skin, joints, lungs, and other organs. Common conditions treated include asthma, allergies, and arthritis. It is also used for other conditions, like blood disorders and diseases of the adrenal glands. This medicine may be used for other purposes; ask your health care provider or pharmacist if you have questions. What should I tell my health care provider before I take this medicine? They need to know if you have any of these conditions: -blood clotting problems -Cushing's syndrome -diabetes -glaucoma -heart problems or disease -high blood pressure -infection like herpes, measles, tuberculosis, or chickenpox -kidney disease -liver disease -mental problems -myasthenia gravis -osteoporosis -previous heart attack -seizures -stomach, ulcer or intestine disease including colitis and diverticulitis -thyroid problem -an unusual or allergic reaction to dexamethasone, corticosteroids, other medicines, lactose, foods, dyes, or preservatives -pregnant or trying to get pregnant -breast-feeding How should I use this medicine? This medicine is for injection into a muscle, joint, lesion, soft tissue, or vein. It is given by a health care professional in a hospital or clinic setting. Talk to your pediatrician regarding the use of this medicine in children. Special care may be needed. Overdosage: If you think you have taken too much of this medicine contact a poison control center or emergency room at once. NOTE: This medicine is only for you. Do not  share this medicine with others. What if I miss a dose? This may not apply. If you are having a series  of injections over a prolonged period, try not to miss an appointment. Call your doctor or health care professional to reschedule if you are unable to keep an appointment. What may interact with this medicine? Do not take this medicine with any of the following medications: -mifepristone, RU-486 -vaccines This medicine may also interact with the following medications: -amphotericin B -antibiotics like clarithromycin, erythromycin, and troleandomycin -aspirin and aspirin-like drugs -barbiturates like phenobarbital -carbamazepine -cholestyramine -cholinesterase inhibitors like donepezil, galantamine, rivastigmine, and tacrine -cyclosporine -digoxin -diuretics -ephedrine -female hormones, like estrogens or progestins and birth control pills -indinavir -isoniazid -ketoconazole -medicines for diabetes -medicines that improve muscle tone or strength for conditions like myasthenia gravis -NSAIDs, medicines for pain and inflammation, like ibuprofen or naproxen -phenytoin -rifampin -thalidomide -warfarin This list may not describe all possible interactions. Give your health care provider a list of all the medicines, herbs, non-prescription drugs, or dietary supplements you use. Also tell them if you smoke, drink alcohol, or use illegal drugs. Some items may interact with your medicine. What should I watch for while using this medicine? Your condition will be monitored carefully while you are receiving this medicine. If you are taking this medicine for a long time, carry an identification card with your name and address, the type and dose of your medicine, and your doctor's name and address. This medicine may increase your risk of getting an infection. Stay away from people who are sick. Tell your doctor or health care professional if you are around anyone with measles or chickenpox. Talk to  your health care provider before you get any vaccines that you take this medicine. If you are going to have surgery, tell your doctor or health care professional that you have taken this medicine within the last twelve months. Ask your doctor or health care professional about your diet. You may need to lower the amount of salt you eat. The medicine can increase your blood sugar. If you are a diabetic check with your doctor if you need help adjusting the dose of your diabetic medicine. What side effects may I notice from receiving this medicine? Side effects that you should report to your doctor or health care professional as soon as possible: -allergic reactions like skin rash, itching or hives, swelling of the face, lips, or tongue -black or tarry stools -change in the amount of urine -changes in vision -confusion, excitement, restlessness, a false sense of well-being -fever, sore throat, sneezing, cough, or other signs of infection, wounds that will not heal -hallucinations -increased thirst -mental depression, mood swings, mistaken feelings of self importance or of being mistreated -pain in hips, back, ribs, arms, shoulders, or legs -pain, redness, or irritation at the injection site -redness, blistering, peeling or loosening of the skin, including inside the mouth -rounding out of face -swelling of feet or lower legs -unusual bleeding or bruising -unusual tired or weak -wounds that do not heal Side effects that usually do not require medical attention (report to your doctor or health care professional if they continue or are bothersome): -diarrhea or constipation -change in taste -headache -nausea, vomiting -skin problems, acne, thin and shiny skin -touble sleeping -unusual growth of hair on the face or body -weight gain This list may not describe all possible side effects. Call your doctor for medical advice about side effects. You may report side effects to FDA at  1-800-FDA-1088. Where should I keep my medicine? This drug is given in a hospital or clinic and will not be stored at home. NOTE:  This sheet is a summary. It may not cover all possible information. If you have questions about this medicine, talk to your doctor, pharmacist, or health care provider.    2016, [employer] Standard. (2007-10-29 14:04:12) Lidocaine; Prilocaine cream What is this medicine? LIDOCAINE; PRILOCAINE (LYE doe kane; PRIL oh kane) is a topical anesthetic that causes loss of feeling in the skin and surrounding tissues. It is used to numb the skin before procedures or injections. This medicine may be used for other purposes; ask your health care provider or pharmacist if you have questions. What should I tell my health care provider before I take this medicine? They need to know if you have any of these conditions: -glucose-6-phosphate deficiencies -heart disease -kidney or liver disease -methemoglobinemia -an unusual or allergic reaction to lidocaine, prilocaine, other medicines, foods, dyes, or preservatives -pregnant or trying to get pregnant -breast-feeding How should I use this medicine? This medicine is for external use only on the skin. Do not take by mouth. Follow the directions on the prescription label. Wash hands before and after use. Do not use more or leave in contact with the skin longer than directed. Do not apply to eyes or open wounds. It can cause irritation and blurred or temporary loss of vision. If this medicine comes in contact with your eyes, immediately rinse the eye with water. Do not touch or rub the eye. Contact your health care provider right away. Talk to your pediatrician regarding the use of this medicine in children. While this medicine may be prescribed for children for selected conditions, precautions do apply. Overdosage: If you think you have taken too much of this medicine contact a poison control center or emergency room at once. NOTE:  This medicine is only for you. Do not share this medicine with others. What if I miss a dose? This medicine is usually only applied once prior to each procedure. It must be in contact with the skin for a period of time for it to work. If you applied this medicine later than directed, tell your health care professional before starting the procedure. What may interact with this medicine? -acetaminophen -chloroquine -dapsone -medicines to control heart rhythm -nitrates like nitroglycerin and nitroprusside -other ointments, creams, or sprays that may contain anesthetic medicine -phenobarbital -phenytoin -quinine -sulfonamides like sulfacetamide, sulfamethoxazole, sulfasalazine and others This list may not describe all possible interactions. Give your health care provider a list of all the medicines, herbs, non-prescription drugs, or dietary supplements you use. Also tell them if you smoke, drink alcohol, or use illegal drugs. Some items may interact with your medicine. What should I watch for while using this medicine? Be careful to avoid injury to the treated area while it is numb and you are not aware of pain. Avoid scratching, rubbing, or exposing the treated area to hot or cold temperatures until complete sensation has returned. The numb feeling will wear off a few hours after applying the cream. What side effects may I notice from receiving this medicine? Side effects that you should report to your doctor or health care professional as soon as possible: -blurred vision -chest pain -difficulty breathing -dizziness -drowsiness -fast or irregular heartbeat -skin rash or itching -swelling of your throat, lips, or face -trembling Side effects that usually do not require medical attention (report to your doctor or health care professional if they continue or are bothersome): -changes in ability to feel hot or cold -redness and swelling at the application site This list may not describe all  possible side effects. Call your doctor for medical advice about side effects. You may report side effects to FDA at 1-800-FDA-1088. Where should I keep my medicine? Keep out of reach of children. Store at room temperature between 15 and 30 degrees C (59 and 86 degrees F). Keep container tightly closed. Throw away any unused medicine after the expiration date. NOTE: This sheet is a summary. It may not cover all possible information. If you have questions about this medicine, talk to your doctor, pharmacist, or health care provider.    2016, Elsevier/[employer] Standard. (2008-01-11 17:14:35) Ondansetron tablets What is this medicine? ONDANSETRON (on DAN se tron) is used to treat nausea and vomiting caused by chemotherapy. It is also used to prevent or treat nausea and vomiting after surgery. This medicine may be used for other purposes; ask your health care provider or pharmacist if you have questions. What should I tell my health care provider before I take this medicine? They need to know if you have any of these conditions: -heart disease -history of irregular heartbeat -liver disease -low levels of magnesium or potassium in the blood -an unusual or allergic reaction to ondansetron, granisetron, other medicines, foods, dyes, or preservatives -pregnant or trying to get pregnant -breast-feeding How should I use this medicine? Take this medicine by mouth with a glass of water. Follow the directions on your prescription label. Take your doses at regular intervals. Do not take your medicine more often than directed. Talk to your pediatrician regarding the use of this medicine in children. Special care may be needed. Overdosage: If you think you have taken too much of this medicine contact a poison control center or emergency room at once. NOTE: This medicine is only for you. Do not share this medicine with others. What if I miss a dose? If you miss a dose, take it as soon as you can. If it is  almost time for your next dose, take only that dose. Do not take double or extra doses. What may interact with this medicine? Do not take this medicine with any of the following medications: -apomorphine -certain medicines for fungal infections like fluconazole, itraconazole, ketoconazole, posaconazole, voriconazole -cisapride -dofetilide -dronedarone -pimozide -thioridazine -ziprasidone This medicine may also interact with the following medications: -carbamazepine -certain medicines for depression, anxiety, or psychotic disturbances -fentanyl -linezolid -MAOIs like Carbex, Eldepryl, Marplan, Nardil, and Parnate -methylene blue (injected into a vein) -other medicines that prolong the QT interval (cause an abnormal heart rhythm) -phenytoin -rifampicin -tramadol This list may not describe all possible interactions. Give your health care provider a list of all the medicines, herbs, non-prescription drugs, or dietary supplements you use. Also tell them if you smoke, drink alcohol, or use illegal drugs. Some items may interact with your medicine. What should I watch for while using this medicine? Check with your doctor or health care professional right away if you have any sign of an allergic reaction. What side effects may I notice from receiving this medicine? Side effects that you should report to your doctor or health care professional as soon as possible: -allergic reactions like skin rash, itching or hives, swelling of the face, lips or tongue -breathing problems -confusion -dizziness -fast or irregular heartbeat -feeling faint or lightheaded, falls -fever and chills -loss of balance or coordination -seizures -sweating -swelling of the hands or feet -tightness in the chest -tremors -unusually weak or tired Side effects that usually do not require medical attention (report to your doctor or health care professional if they  continue or are bothersome): -constipation or  diarrhea -headache This list may not describe all possible side effects. Call your doctor for medical advice about side effects. You may report side effects to FDA at 1-800-FDA-1088. Where should I keep my medicine? Keep out of the reach of children. Store between 2 and 30 degrees C (36 and 86 degrees F). Throw away any unused medicine after the expiration date. NOTE: This sheet is a summary. It may not cover all possible information. If you have questions about this medicine, talk to your doctor, pharmacist, or health care provider.    2016, [employer] Standard. (2013-04-14 16:27:45) Prochlorperazine tablets What is this medicine? PROCHLORPERAZINE (proe klor PER a zeen) helps to control severe nausea and vomiting. This medicine is also used to treat schizophrenia. It can also help patients who experience anxiety that is not due to psychological illness. This medicine may be used for other purposes; ask your health care provider or pharmacist if you have questions. What should I tell my health care provider before I take this medicine? They need to know if you have any of these conditions: -blood disorders or disease -dementia -liver disease or jaundice -Parkinson's disease -uncontrollable movement disorder -an unusual or allergic reaction to prochlorperazine, other medicines, foods, dyes, or preservatives -pregnant or trying to get pregnant -breast-feeding How should I use this medicine? Take this medicine by mouth with a glass of water. Follow the directions on the prescription label. Take your doses at regular intervals. Do not take your medicine more often than directed. Do not stop taking this medicine suddenly. This can cause nausea, vomiting, and dizziness. Ask your doctor or health care professional for advice. Talk to your pediatrician regarding the use of this medicine in children. Special care may be needed. While this drug may be prescribed for children as young as 2 years  for selected conditions, precautions do apply. Overdosage: If you think you have taken too much of this medicine contact a poison control center or emergency room at once. NOTE: This medicine is only for you. Do not share this medicine with others. What if I miss a dose? If you miss a dose, take it as soon as you can. If it is almost time for your next dose, take only that dose. Do not take double or extra doses. What may interact with this medicine? Do not take this medicine with any of the following medications: -amoxapine -antidepressants like citalopram, escitalopram, fluoxetine, paroxetine, and sertraline -deferoxamine -dofetilide -maprotiline -tricyclic antidepressants like amitriptyline, clomipramine, imipramine, nortiptyline and others This medicine may also interact with the following medications: -lithium -medicines for pain -phenytoin -propranolol -warfarin This list may not describe all possible interactions. Give your health care provider a list of all the medicines, herbs, non-prescription drugs, or dietary supplements you use. Also tell them if you smoke, drink alcohol, or use illegal drugs. Some items may interact with your medicine. What should I watch for while using this medicine? Visit your doctor or health care professional for regular checks on your progress. You may get drowsy or dizzy. Do not drive, use machinery, or do anything that needs mental alertness until you know how this medicine affects you. Do not stand or sit up quickly, especially if you are an older patient. This reduces the risk of dizzy or fainting spells. Alcohol may interfere with the effect of this medicine. Avoid alcoholic drinks. This medicine can reduce the response of your body to heat or cold. Dress warm in cold weather and  stay hydrated in hot weather. If possible, avoid extreme temperatures like saunas, hot tubs, very hot or cold showers, or activities that can cause dehydration such as  vigorous exercise. This medicine can make you more sensitive to the sun. Keep out of the sun. If you cannot avoid being in the sun, wear protective clothing and use sunscreen. Do not use sun lamps or tanning beds/booths. Your mouth may get dry. Chewing sugarless gum or sucking hard candy, and drinking plenty of water may help. Contact your doctor if the problem does not go away or is severe. What side effects may I notice from receiving this medicine? Side effects that you should report to your doctor or health care professional as soon as possible: -blurred vision -breast enlargement in men or women -breast milk in women who are not breast-feeding -chest pain, fast or irregular heartbeat -confusion, restlessness -dark yellow or brown urine -difficulty breathing or swallowing -dizziness or fainting spells -drooling, shaking, movement difficulty (shuffling walk) or rigidity -fever, chills, sore throat -involuntary or uncontrollable movements of the eyes, mouth, head, arms, and legs -seizures -stomach area pain -unusually weak or tired -unusual bleeding or bruising -yellowing of skin or eyes Side effects that usually do not require medical attention (report to your doctor or health care professional if they continue or are bothersome): -difficulty passing urine -difficulty sleeping -headache -sexual dysfunction -skin rash, or itching This list may not describe all possible side effects. Call your doctor for medical advice about side effects. You may report side effects to FDA at 1-800-FDA-1088. Where should I keep my medicine? Keep out of the reach of children. Store at room temperature between 15 and 30 degrees C (59 and 86 degrees F). Protect from light. Throw away any unused medicine after the expiration date. NOTE: This sheet is a summary. It may not cover all possible information. If you have questions about this medicine, talk to your doctor, pharmacist, or health care provider.     2016, [employer]vier/Gold Standard. (2011-11-26 16:59:39)

## 2015-12-12 ENCOUNTER — Other Ambulatory Visit (HOSPITAL_COMMUNITY): Payer: Self-pay | Admitting: Oncology

## 2015-12-12 ENCOUNTER — Encounter (HOSPITAL_COMMUNITY): Payer: Self-pay | Admitting: Lab

## 2015-12-12 ENCOUNTER — Ambulatory Visit (HOSPITAL_COMMUNITY)
Admission: RE | Admit: 2015-12-12 | Discharge: 2015-12-12 | Disposition: A | Discharge status: Home / Self Care | Payer: Commercial Managed Care - HMO | Source: Ambulatory Visit | Attending: Oncology | Admitting: Oncology

## 2015-12-12 ENCOUNTER — Encounter (HOSPITAL_COMMUNITY): Payer: Self-pay | Admitting: Thoracic Surgery (Cardiothoracic Vascular Surgery)

## 2015-12-12 ENCOUNTER — Ambulatory Visit (HOSPITAL_COMMUNITY)
Admission: RE | Admit: 2015-12-12 | Discharge: 2015-12-12 | Disposition: A | Discharge status: Home / Self Care | Payer: Commercial Managed Care - HMO | Source: Ambulatory Visit | Attending: Hematology & Oncology | Admitting: Hematology & Oncology

## 2015-12-12 DIAGNOSIS — C3490 Malignant neoplasm of unspecified part of unspecified bronchus or lung: Secondary | ICD-10-CM

## 2015-12-12 DIAGNOSIS — E785 Hyperlipidemia, unspecified: Secondary | ICD-10-CM | POA: Insufficient documentation

## 2015-12-12 DIAGNOSIS — Z79899 Other long term (current) drug therapy: Secondary | ICD-10-CM | POA: Insufficient documentation

## 2015-12-12 DIAGNOSIS — F17200 Nicotine dependence, unspecified, uncomplicated: Secondary | ICD-10-CM | POA: Diagnosis not present

## 2015-12-12 DIAGNOSIS — Z452 Encounter for adjustment and management of vascular access device: Secondary | ICD-10-CM | POA: Diagnosis not present

## 2015-12-12 DIAGNOSIS — C3401 Malignant neoplasm of right main bronchus: Secondary | ICD-10-CM | POA: Insufficient documentation

## 2015-12-12 DIAGNOSIS — Z85118 Personal history of other malignant neoplasm of bronchus and lung: Secondary | ICD-10-CM | POA: Diagnosis not present

## 2015-12-12 LAB — CBC WITH DIFFERENTIAL/PLATELET
Basophils Absolute: 0 10*3/uL (ref 0.0–0.1)
Basophils Relative: 0 %
Eosinophils Absolute: 0 10*3/uL (ref 0.0–0.7)
Eosinophils Relative: 0 %
HEMATOCRIT: 29.5 % — AB (ref 36.0–46.0)
HEMOGLOBIN: 9 g/dL — AB (ref 12.0–15.0)
LYMPHS PCT: 18 %
Lymphs Abs: 1.5 10*3/uL (ref 0.7–4.0)
MCH: 23.9 pg — AB (ref 26.0–34.0)
MCHC: 30.5 g/dL (ref 30.0–36.0)
MCV: 78.2 fL (ref 78.0–100.0)
MONOS PCT: 7 %
Monocytes Absolute: 0.6 10*3/uL (ref 0.1–1.0)
NEUTROS ABS: 6 10*3/uL (ref 1.7–7.7)
NEUTROS PCT: 75 %
Platelets: 202 10*3/uL (ref 150–400)
RBC: 3.77 MIL/uL — ABNORMAL LOW (ref 3.87–5.11)
RDW: 15.5 % (ref 11.5–15.5)
WBC: 8.1 10*3/uL (ref 4.0–10.5)

## 2015-12-12 MED ORDER — FENTANYL CITRATE (PF) 100 MCG/2ML IJ SOLN
INTRAMUSCULAR | Status: AC | PRN
  Administered 2015-12-12: 50 ug via INTRAVENOUS

## 2015-12-12 MED ORDER — KETOROLAC TROMETHAMINE 30 MG/ML IJ SOLN: 30.0000 mg | Freq: Once | INTRAMUSCULAR | Status: DC

## 2015-12-12 MED ORDER — LIDOCAINE HCL 1 % IJ SOLN
INTRAMUSCULAR | Status: AC | PRN
  Administered 2015-12-12: 10 mL via INTRADERMAL

## 2015-12-12 MED ORDER — LIDOCAINE HCL 1 % IJ SOLN
INTRAMUSCULAR | Status: AC
  Filled 2015-12-12: qty 20

## 2015-12-12 MED ORDER — MIDAZOLAM HCL 2 MG/2ML IJ SOLN
INTRAMUSCULAR | Status: AC | PRN
  Administered 2015-12-12: 1 mg via INTRAVENOUS

## 2015-12-12 MED ORDER — HEPARIN SOD (PORK) LOCK FLUSH 100 UNIT/ML IV SOLN
INTRAVENOUS | Status: AC
  Filled 2015-12-12: qty 5

## 2015-12-12 MED ORDER — HEPARIN SOD (PORK) LOCK FLUSH 100 UNIT/ML IV SOLN
INTRAVENOUS | Status: AC | PRN
  Administered 2015-12-12: 500 [IU] via INTRAVENOUS

## 2015-12-12 MED ORDER — SODIUM CHLORIDE 0.9 % IV SOLN
INTRAVENOUS | Status: DC
  Administered 2015-12-12: 13:00:00 via INTRAVENOUS

## 2015-12-12 MED ORDER — FENTANYL CITRATE (PF) 100 MCG/2ML IJ SOLN
INTRAMUSCULAR | Status: DC
Start: 2015-12-12 — End: 2015-12-13
  Filled 2015-12-12: qty 2

## 2015-12-12 MED ORDER — VANCOMYCIN HCL IN DEXTROSE 1-5 GM/200ML-% IV SOLN
1000.0000 mg | INTRAVENOUS | Status: AC
  Administered 2015-12-12: 1000 mg via INTRAVENOUS
  Filled 2015-12-12: qty 200

## 2015-12-12 MED ORDER — MIDAZOLAM HCL 2 MG/2ML IJ SOLN
INTRAMUSCULAR | Status: AC
  Filled 2015-12-12: qty 4

## 2015-12-12 NOTE — Procedures (Signed)
Interventional Radiology Procedure Note  Procedure: Placement of a right IJ approach single lumen PowerPort.  Tip is positioned at the superior cavoatrial junction and catheter is ready for immediate use.  Complications: No immediate Recommendations:  - Ok to shower tomorrow - Do not submerge for 7 days - Routine line care   Signed,  Vallorie Niccoli S. Laron Boorman, DO    

## 2015-12-12 NOTE — Progress Notes (Signed)
Referral sent to [facility].  Records faxed on 5/22 , spoke to [place] on 5/23 has records

## 2015-12-12 NOTE — Discharge Instructions (Signed)
Moderate Conscious Sedation, Adult, Care After °Refer to this sheet in the next few weeks. These instructions provide you with information on caring for yourself after your procedure. Your health care provider may also give you more specific instructions. Your treatment has been planned according to current medical practices, but problems sometimes occur. Call your health care provider if you have any problems or questions after your procedure. °WHAT TO EXPECT AFTER THE PROCEDURE  °After your procedure: °· You may feel sleepy, clumsy, and have poor balance for several hours. °· Vomiting may occur if you eat too soon after the procedure. °HOME CARE INSTRUCTIONS °· Do not participate in any activities where you could become injured for at least 24 hours. Do not: °¨ Drive. °¨ Swim. °¨ Ride a bicycle. °¨ Operate heavy machinery. °¨ Cook. °¨ Use power tools. °¨ Climb ladders. °¨ Work from a high place. °· Do not make important decisions or sign legal documents until you are improved. °· If you vomit, drink water, juice, or soup when you can drink without vomiting. Make sure you have little or no nausea before eating solid foods. °· Only take over-the-counter or prescription medicines for pain, discomfort, or fever as directed by your health care provider. °· Make sure you and your family fully understand everything about the medicines given to you, including what side effects may occur. °· You should not drink alcohol, take sleeping pills, or take medicines that cause drowsiness for at least 24 hours. °· If you smoke, do not smoke without supervision. °· If you are feeling better, you may resume normal activities 24 hours after you were sedated. °· Keep all appointments with your health care provider. °SEEK MEDICAL CARE IF: °· Your skin is pale or bluish in color. °· You continue to feel nauseous or vomit. °· Your pain is getting worse and is not helped by medicine. °· You have bleeding or swelling. °· You are still  sleepy or feeling clumsy after 24 hours. °SEEK IMMEDIATE MEDICAL CARE IF: °· You develop a rash. °· You have difficulty breathing. °· You develop any type of allergic problem. °· You have a fever. °MAKE SURE YOU: °· Understand these instructions. °· Will watch your condition. °· Will get help right away if you are not doing well or get worse. °  °This information is not intended to replace advice given to you by your health care provider. Make sure you discuss any questions you have with your health care provider. °  °Document Released: 04/28/2013 Document Revised: 07/29/2014 Document Reviewed: 04/28/2013 °Elsevier Interactive Patient Education ©2016 [employer] °Implanted Port Insertion, Care After °Refer to this sheet in the next few weeks. These instructions provide you with information on caring for yourself after your procedure. Your health care provider may also give you more specific instructions. Your treatment has been planned according to current medical practices, but problems sometimes occur. Call your health care provider if you have any problems or questions after your procedure. °WHAT TO EXPECT AFTER THE PROCEDURE °After your procedure, it is typical to have the following:  °· Discomfort at the port insertion site. Ice packs to the area will help. °· Bruising on the skin over the port. This will subside in 3-4 days. °HOME CARE INSTRUCTIONS °· After your port is placed, you will get a manufacturer's information card. The card has information about your port. Keep this card with you at all times.   °· Know what kind of port you have. There are many types   of ports available.   °· Wear a medical alert bracelet in case of an emergency. This can help alert health care workers that you have a port.   °· The port can stay in for as long as your health care provider believes it is necessary.   °· A home health care nurse may give medicines and take care of the port.   °· You or a family member can get  special training and directions for giving medicine and taking care of the port at home.   °SEEK MEDICAL CARE IF:  °· Your port does not flush or you are unable to get a blood return.   °· You have a fever or chills. °SEEK IMMEDIATE MEDICAL CARE IF: °· You have new fluid or pus coming from your incision.   °· You notice a bad smell coming from your incision site.   °· You have swelling, pain, or more redness at the incision or port site.   °· You have chest pain or shortness of breath. °  °This information is not intended to replace advice given to you by your health care provider. Make sure you discuss any questions you have with your health care provider. °  °Document Released: 04/28/2013 Document Revised: 07/13/2013 Document Reviewed: 04/28/2013 °Elsevier Interactive Patient Education ©2016 [employer] °Implanted Port Home Guide °An implanted port is a type of central line that is placed under the skin. Central lines are used to provide IV access when treatment or nutrition needs to be given through a person's veins. Implanted ports are used for long-term IV access. An implanted port may be placed because:  °· You need IV medicine that would be irritating to the small veins in your hands or arms.   °· You need long-term IV medicines, such as antibiotics.   °· You need IV nutrition for a long period.   °· You need frequent blood draws for lab tests.   °· You need dialysis.   °Implanted ports are usually placed in the chest area, but they can also be placed in the upper arm, the abdomen, or the leg. An implanted port has two main parts:  °· Reservoir. The reservoir is round and will appear as a small, raised area under your skin. The reservoir is the part where a needle is inserted to give medicines or draw blood.   °· Catheter. The catheter is a thin, flexible tube that extends from the reservoir. The catheter is placed into a large vein. Medicine that is inserted into the reservoir goes into the catheter and  then into the vein.   °HOW WILL I CARE FOR MY INCISION SITE? °Do not get the incision site wet. Bathe or shower as directed by your health care provider.  °HOW IS MY PORT ACCESSED? °Special steps must be taken to access the port:  °· Before the port is accessed, a numbing cream can be placed on the skin. This helps numb the skin over the port site.   °· Your health care provider uses a sterile technique to access the port. °· Your health care provider must put on a mask and sterile gloves. °· The skin over your port is cleaned carefully with an antiseptic and allowed to dry. °· The port is gently pinched between sterile gloves, and a needle is inserted into the port. °· Only "non-coring" port needles should be used to access the port. Once the port is accessed, a blood return should be checked. This helps ensure that the port is in the vein and is not clogged.   °· If your port   needs to remain accessed for a constant infusion, a clear (transparent) bandage will be placed over the needle site. The bandage and needle will need to be changed every week, or as directed by your health care provider.   °· Keep the bandage covering the needle clean and dry. Do not get it wet. Follow your health care provider's instructions on how to take a shower or bath while the port is accessed.   °· If your port does not need to stay accessed, no bandage is needed over the port.   °WHAT IS FLUSHING? °Flushing helps keep the port from getting clogged. Follow your health care provider's instructions on how and when to flush the port. Ports are usually flushed with saline solution or a medicine called heparin. The need for flushing will depend on how the port is used.  °· If the port is used for intermittent medicines or blood draws, the port will need to be flushed:   °· After medicines have been given.   °· After blood has been drawn.   °· As part of routine maintenance.   °· If a constant infusion is running, the port may not need to  be flushed.   °HOW LONG WILL MY PORT STAY IMPLANTED? °The port can stay in for as long as your health care provider thinks it is needed. When it is time for the port to come out, surgery will be done to remove it. The procedure is similar to the one performed when the port was put in.  °WHEN SHOULD I SEEK IMMEDIATE MEDICAL CARE? °When you have an implanted port, you should seek immediate medical care if:  °· You notice a bad smell coming from the incision site.   °· You have swelling, redness, or drainage at the incision site.   °· You have more swelling or pain at the port site or the surrounding area.   °· You have a fever that is not controlled with medicine. °  °This information is not intended to replace advice given to you by your health care provider. Make sure you discuss any questions you have with your health care provider. °  °Document Released: 07/08/2005 Document Revised: 04/28/2013 Document Reviewed: 03/15/2013 °Elsevier Interactive Patient Education ©2016 [employer] Inc. ° °

## 2015-12-12 NOTE — H&P (Signed)
Chief Complaint: lung cancer  Referring Physician:Dr. Ancil Linsey  Supervising Physician: Corrie Mckusick  Patient Status: Out-pt  HPI: Janet Fletcher is an 80 y.o. female who was recently diagnosed with lung cancer.  She is followed by Dr. Whitney Muse.  She is getting ready to initiate chemotherapy starting tomorrow.  A request for a PAC placement has been made.  Past Medical History:  Past Medical History  Diagnosis Date  . Hyperlipidemia   . Allergy   . Osteopenia     Past Surgical History:  Past Surgical History  Procedure Laterality Date  . Small intestine surgery    . Cataract extraction, bilateral Bilateral   . Minor excision ear canal cyst Left     30+ years ago  . Video bronchoscopy with endobronchial ultrasound N/A 12/11/2015    Procedure: VIDEO BRONCHOSCOPY WITH ENDOBRONCHIAL ULTRASOUND;  Surgeon: Melrose Nakayama, MD;  Location: [facility] OR;  Service: Thoracic;  Laterality: N/A;    Family History:  Family History  Problem Relation Age of Onset  . Cancer Mother   . Hypertension Father     Social History:  reports that she has been smoking.  She has never used smokeless tobacco. She reports that she does not drink alcohol or use illicit drugs.  Allergies:  Allergies  Allergen Reactions  . Acetaminophen Other (See Comments)    HEART RACES AWAY/ EXTRA STRENGTH TYLENOL  . Flagyl [Metronidazole Hcl] Other (See Comments)  . Penicillins Other (See Comments)    Medications:   Medication List    ASK your doctor about these medications        cefdinir 300 MG capsule  Commonly known as:  OMNICEF  Take 300 mg by mouth 2 (two) times daily.     CISPLATIN IV  Inject into the vein. Day 1 every 21 days (to begin 12/13/15)     ETOPOSIDE IV  Inject into the vein. Days 1-3 every 21 days (to begin 12/13/15)     lidocaine-prilocaine cream  Commonly known as:  EMLA  Apply a quarter size amount to port site 1 hour prior to chemo. Do not rub in. Cover with plastic  wrap.     NEULASTA ONPRO [place]  Inject into the skin. To be administered 27 hours after the completion of chemo.     ondansetron 8 MG tablet  Commonly known as:  ZOFRAN  Take 1 tablet (8 mg total) by mouth every 8 (eight) hours as needed for nausea or vomiting.     prochlorperazine 10 MG tablet  Commonly known as:  COMPAZINE  Take 1 tablet (10 mg total) by mouth every 6 (six) hours as needed for nausea or vomiting.     simvastatin 40 MG tablet  Commonly known as:  ZOCOR  Take 1 tablet (40 mg total) by mouth at bedtime.        Please HPI for pertinent positives, otherwise complete 10 system ROS negative.  Mallampati Score: MD Evaluation Airway: WNL Heart: WNL Abdomen: WNL Chest/ Lungs: WNL ASA  Classification: 2 Mallampati/Airway Score: One  Physical Exam: BP 123/60 mmHg  Pulse 58  Temp(Src) 97.9 F (36.6 C) (Oral)  Resp 18  SpO2 100% There is no weight on file to calculate BMI. General: pleasant, elderly black female who is laying in bed in NAD HEENT: head is normocephalic, atraumatic.  Sclera are noninjected.  PERRL.  Ears and nose without any masses or lesions.  Mouth is pink and moist Heart: regular, rate, and rhythm.  Normal  s1,s2. No obvious murmurs, gallops, or rubs noted.  Palpable radial and pedal pulses bilaterally Lungs: CTAB, no wheezes, rhonchi, or rales noted.  Respiratory effort nonlabored Abd: soft, NT, ND, +BS, no masses, hernias, or organomegaly Psych: A&Ox3 with an appropriate affect.   Labs: Results for orders placed or performed during the hospital encounter of 12/12/15 (from the past 48 hour(s))  CBC with Differential/Platelet     Status: Abnormal   Collection Time: 12/12/15  1:00 PM  Result Value Ref Range   WBC 8.1 4.0 - 10.5 K/uL   RBC 3.77 (L) 3.87 - 5.11 MIL/uL   Hemoglobin 9.0 (L) 12.0 - 15.0 g/dL   HCT 29.5 (L) 36.0 - 46.0 %   MCV 78.2 78.0 - 100.0 fL   MCH 23.9 (L) 26.0 - 34.0 pg   MCHC 30.5 30.0 - 36.0 g/dL   RDW 15.5 11.5 - 15.5 %     Platelets 202 150 - 400 K/uL   Neutrophils Relative % 75 %   Neutro Abs 6.0 1.7 - 7.7 K/uL   Lymphocytes Relative 18 %   Lymphs Abs 1.5 0.7 - 4.0 K/uL   Monocytes Relative 7 %   Monocytes Absolute 0.6 0.1 - 1.0 K/uL   Eosinophils Relative 0 %   Eosinophils Absolute 0.0 0.0 - 0.7 K/uL   Basophils Relative 0 %   Basophils Absolute 0.0 0.0 - 0.1 K/uL    Imaging: Dg Chest 2 View  12/11/2015  CLINICAL DATA:  Preop for mediastinal mass EXAM: CHEST  2 VIEW COMPARISON:  12/05/2015 and 11/27/2015 FINDINGS: Cardiomediastinal silhouette is stable. Right paratracheal soft tissue prominence again noted. Atherosclerotic calcifications of thoracic aorta. No infiltrate or pulmonary edema. Hyperinflation and mild emphysematous changes. IMPRESSION: Right paratracheal soft tissue prominence again noted. No infiltrate or pulmonary edema. Hyperinflation and mild emphysematous changes. Electronically Signed   By: Lahoma Crocker M.D.   On: 12/11/2015 08:18   Dg Chest Port 1 View  12/11/2015  CLINICAL DATA:  Laryngospasm EXAM: PORTABLE CHEST 1 VIEW COMPARISON:  12/11/2015, 12/05/2015 FINDINGS: Right peritracheal fullness compatible with known hypermetabolic mass concerning for malignancy. Stable mild cardiac enlargement. Vascular pattern normal. Lungs clear. Bilateral nipple shadows noted. IMPRESSION: Stable soft tissue fullness right paratracheal region. Electronically Signed   By: Skipper Cliche M.D.   On: 12/11/2015 12:43    Assessment/Plan 1. Lung carcinoma -we will plan to proceed with PAC placement today so the patient can initiate chemotherapy -labs and vitals have been reviewed -Risks and Benefits discussed with the patient including, but not limited to bleeding, infection, pneumothorax, or fibrin sheath development and need for additional procedures. All of the patient's questions were answered, patient is agreeable to proceed. Consent signed and in chart.   Thank you for this interesting consult.  I  greatly enjoyed meeting Janet Fletcher and look forward to participating in their care.  A copy of this report was sent to the requesting provider on this date.  Electronically Signed: Henreitta Cea 12/12/2015, 3:10 PM   I spent a total of  30 Minutes  in face to face in clinical consultation, greater than 50% of which was counseling/coordinating care for lung cancer, needs PAC

## 2015-12-13 ENCOUNTER — Encounter (HOSPITAL_COMMUNITY): Payer: Self-pay | Admitting: Hematology & Oncology

## 2015-12-13 ENCOUNTER — Encounter: Payer: Self-pay | Admitting: Dietician

## 2015-12-13 ENCOUNTER — Encounter (HOSPITAL_COMMUNITY): Payer: Commercial Managed Care - HMO

## 2015-12-13 ENCOUNTER — Encounter (HOSPITAL_BASED_OUTPATIENT_CLINIC_OR_DEPARTMENT_OTHER): Payer: Commercial Managed Care - HMO | Admitting: Hematology & Oncology

## 2015-12-13 ENCOUNTER — Encounter (HOSPITAL_BASED_OUTPATIENT_CLINIC_OR_DEPARTMENT_OTHER): Payer: Commercial Managed Care - HMO

## 2015-12-13 VITALS — BP 132/60 | HR 67 | Temp 98.0°F | Resp 18 | Wt 123.5 lb

## 2015-12-13 DIAGNOSIS — C3411 Malignant neoplasm of upper lobe, right bronchus or lung: Secondary | ICD-10-CM

## 2015-12-13 DIAGNOSIS — Z9889 Other specified postprocedural states: Secondary | ICD-10-CM | POA: Diagnosis not present

## 2015-12-13 DIAGNOSIS — Z888 Allergy status to other drugs, medicaments and biological substances status: Secondary | ICD-10-CM | POA: Diagnosis not present

## 2015-12-13 DIAGNOSIS — C3491 Malignant neoplasm of unspecified part of right bronchus or lung: Secondary | ICD-10-CM

## 2015-12-13 DIAGNOSIS — Z5111 Encounter for antineoplastic chemotherapy: Secondary | ICD-10-CM

## 2015-12-13 DIAGNOSIS — M8580 Other specified disorders of bone density and structure, unspecified site: Secondary | ICD-10-CM | POA: Diagnosis not present

## 2015-12-13 DIAGNOSIS — D649 Anemia, unspecified: Secondary | ICD-10-CM

## 2015-12-13 DIAGNOSIS — E785 Hyperlipidemia, unspecified: Secondary | ICD-10-CM | POA: Diagnosis not present

## 2015-12-13 DIAGNOSIS — Z720 Tobacco use: Secondary | ICD-10-CM

## 2015-12-13 DIAGNOSIS — F17210 Nicotine dependence, cigarettes, uncomplicated: Secondary | ICD-10-CM | POA: Diagnosis not present

## 2015-12-13 DIAGNOSIS — R05 Cough: Secondary | ICD-10-CM

## 2015-12-13 DIAGNOSIS — C3490 Malignant neoplasm of unspecified part of unspecified bronchus or lung: Secondary | ICD-10-CM | POA: Diagnosis not present

## 2015-12-13 LAB — COMPREHENSIVE METABOLIC PANEL
ALBUMIN: 3.3 g/dL — AB (ref 3.5–5.0)
ALK PHOS: 53 U/L (ref 38–126)
ALT: 13 U/L — AB (ref 14–54)
AST: 26 U/L (ref 15–41)
Anion gap: 6 (ref 5–15)
BILIRUBIN TOTAL: 0.3 mg/dL (ref 0.3–1.2)
BUN: 15 mg/dL (ref 6–20)
CALCIUM: 8.8 mg/dL — AB (ref 8.9–10.3)
CO2: 28 mmol/L (ref 22–32)
CREATININE: 0.79 mg/dL (ref 0.44–1.00)
Chloride: 106 mmol/L (ref 101–111)
GFR calc Af Amer: >60 mL/min (ref >60)
GFR calc non Af Amer: >60 mL/min (ref >60)
GLUCOSE: 82 mg/dL (ref 65–99)
POTASSIUM: 3.9 mmol/L (ref 3.5–5.1)
Sodium: 140 mmol/L (ref 135–145)
TOTAL PROTEIN: 6.5 g/dL (ref 6.5–8.1)

## 2015-12-13 LAB — CBC WITH DIFFERENTIAL/PLATELET
BASOS ABS: 0 10*3/uL (ref 0.0–0.1)
BASOS PCT: 0 %
Eosinophils Absolute: 0 10*3/uL (ref 0.0–0.7)
Eosinophils Relative: 1 %
HEMATOCRIT: 30.3 % — AB (ref 36.0–46.0)
HEMOGLOBIN: 9.1 g/dL — AB (ref 12.0–15.0)
Lymphocytes Relative: 25 %
Lymphs Abs: 1.4 10*3/uL (ref 0.7–4.0)
MCH: 23.7 pg — ABNORMAL LOW (ref 26.0–34.0)
MCHC: 30 g/dL (ref 30.0–36.0)
MCV: 78.9 fL (ref 78.0–100.0)
Monocytes Absolute: 0.4 10*3/uL (ref 0.1–1.0)
Monocytes Relative: 7 %
NEUTROS ABS: 3.7 10*3/uL (ref 1.7–7.7)
Neutrophils Relative %: 67 %
Platelets: 193 10*3/uL (ref 150–400)
RBC: 3.84 MIL/uL — AB (ref 3.87–5.11)
RDW: 15.4 % (ref 11.5–15.5)
WBC: 5.5 10*3/uL (ref 4.0–10.5)

## 2015-12-13 LAB — MAGNESIUM: Magnesium: 1.8 mg/dL (ref 1.7–2.4)

## 2015-12-13 MED ORDER — SODIUM CHLORIDE 0.9% FLUSH: 10.0000 mL | INTRAVENOUS | Status: DC | PRN

## 2015-12-13 MED ORDER — HEPARIN SOD (PORK) LOCK FLUSH 100 UNIT/ML IV SOLN: 500.0000 [IU] | Freq: Once | INTRAVENOUS | Status: DC | PRN

## 2015-12-13 MED ORDER — PALONOSETRON HCL INJECTION 0.25 MG/5ML
0.2500 mg | Freq: Once | INTRAVENOUS | Status: AC
  Administered 2015-12-13: 0.25 mg via INTRAVENOUS
  Filled 2015-12-13: qty 5

## 2015-12-13 MED ORDER — SODIUM CHLORIDE 0.9 % IV SOLN
80.0000 mg/m2 | Freq: Once | INTRAVENOUS | Status: AC
  Administered 2015-12-13: 125 mg via INTRAVENOUS
  Filled 2015-12-13: qty 125

## 2015-12-13 MED ORDER — POTASSIUM CHLORIDE 2 MEQ/ML IV SOLN
Freq: Once | INTRAVENOUS | Status: AC
  Administered 2015-12-13: 10:00:00 via INTRAVENOUS
  Filled 2015-12-13: qty 10

## 2015-12-13 MED ORDER — SODIUM CHLORIDE 0.9 % IV SOLN
100.0000 mg/m2 | Freq: Once | INTRAVENOUS | Status: AC
  Administered 2015-12-13: 160 mg via INTRAVENOUS
  Filled 2015-12-13: qty 8

## 2015-12-13 MED ORDER — SODIUM CHLORIDE 0.9 % IV SOLN
Freq: Once | INTRAVENOUS | Status: AC
  Administered 2015-12-13: 12:00:00 via INTRAVENOUS
  Filled 2015-12-13: qty 5

## 2015-12-13 NOTE — Patient Instructions (Signed)
[place] at [facility]  Discharge Instructions:  Return in 1 week to follow up after chemo   Call with any problems after this chemo treatment to [phone]  Numb port 1 hour prior to visit    _______________________________________________________________  Thank you for choosing [place] at [facility] to provide your oncology and hematology care.  To afford each patient quality time with our providers, please arrive at least 15 minutes before your scheduled appointment.  You need to re-schedule your appointment if you arrive 10 or more minutes late.  We strive to give you quality time with our providers, and arriving late affects you and other patients whose appointments are after yours.  Also, if you no show three or more times for appointments you may be dismissed from the clinic.  Again, thank you for choosing [place] at [place] hope is that these requests will allow you access to exceptional care and in a timely manner. _______________________________________________________________  If you have questions after your visit, please contact our office at [phone] between the hours of 8:30 a.m. and 5:00 p.m. Voicemails left after 4:30 p.m. will not be returned until the following business day. _______________________________________________________________  For prescription refill requests, have your pharmacy contact our office. _______________________________________________________________  Recommendations made by the consultant and any test results will be sent to your referring physician. _______________________________________________________________

## 2015-12-13 NOTE — Progress Notes (Signed)
Patient identified to be at risk for malnutrition by Oncology Nursing secondary to Poor appetite and weight loss  Contacted Pt by visiting during her first chemo session    Wt Readings from Last 10 Encounters:  12/13/15 123 lb 8 oz (56.019 kg)  12/07/15 119 lb (53.978 kg)  12/06/15 119 lb 11.2 oz (54.296 kg)  11/29/15 122 lb (55.339 kg)  11/28/15 123 lb 9.6 oz (56.065 kg)  10/11/15 132 lb 6.4 oz (60.056 kg)  09/18/15 132 lb (59.875 kg)  05/26/15 138 lb (62.596 kg)  09/29/14 144 lb (65.318 kg)  06/11/14 144 lb (65.318 kg)   Patient weight had decreased from ~140 to 119 in just over six months, however she has been able to regain 5 lbs. Today she reports UBW as 150-160 lbs, though no recent documentation to substantiate this.  Patient reports oral intake as impaired, but improving. She denies suffering from any other symptoms at this time including n/v/c/d.  Went through pt's current dietary recall. She is eating 3 meals at this time. She has been forcing herself to eat, but "it is getting better". She says she has been drinking Ensure/Boost, "sometimes 4 a day". She will greatly benefit from ensure program.  Discussed with pt the basics of nutrition therapy during treatment: minimize side effects w/ diet, prioritize eating high kcal/protein foods as able and eating frequently. All of this is done in attempt to maintain weight and, by association, improve outcomes.   RD went through her dietary recall and discussed which food choices were better than others. Went over high protein/calorie foods and asked her to prioritize these over the lower kcal options.   Pt seemed to be in an optimistic mood and on the upswing. However, she has not begun therapy yet.   Left my contact info, coupons, ensure case, and handouts titled "Increasing Calories and Protein"  Burtis Junes RD, LDN Nutrition Pager: [phone] 12/13/2015 12:43 PM

## 2015-12-13 NOTE — Progress Notes (Signed)
1515:  Tolerated tx w/o adverse reaction.  VSS.  A&Ox4, in no distress.  Discharged ambulatory in c/o family for transport home.

## 2015-12-13 NOTE — Patient Instructions (Signed)
Chemotherapy Chemotherapy is the use of medicines to stop or slow the growth of cancer cells. Depending on the type and stage of your cancer, you may have chemotherapy to:  Cure your cancer.  Slow the progression of your cancer.  Ease your cancer symptoms.  Improve the benefits of radiation treatment.  Shrink a tumor before surgery.  Rid the body of cancer cells that remain after a tumor is surgically removed. HOW IS CHEMOTHERAPY GIVEN? Chemotherapy may be given:  By mouth in liquid or pill form.  Through a thin tube that is inserted into a vein or artery.  By getting a shot.  By rubbing a cream or ointment on your skin.  Through liquids that are placed directly into various areas of the body, such as the abdomen, chest, or bladder. HOW OFTEN IS CHEMOTHERAPY GIVEN? Chemotherapy may be given continuously over time, or it may be given in cycles. For example, you may take the medicine for one week out of every month. FOR HOW LONG WILL I NEED CHEMOTHERAPY TREATMENTS? The length of treatment depends on many factors, including:  The type of cancer.  Whether the cancer has spread.  How you respond to the chemotherapy.  Whether you develop side effects. Some types of chemotherapy medicine are given only one time. Others are given for months, years, or for life. WHAT SAFETY PRECAUTIONS MUST I TAKE WHILE ON CHEMOTHERAPY? Chemotherapy medicines are very strong. They will be in all of your bodily fluids, including your urine, stool, saliva, sweat, tears, vaginal secretions, and semen. You must carefully follow some safety precautions to prevent harm to others while you are using these medicines. Here are some recommended precautions:  Make sure that people who help care for you wear disposable gloves if they are going to come into contact with any of your bodily fluids. Women who are pregnant or breastfeeding should not handle any of your bodily fluids.  Wash any clothes, towels,  and linens that may have your bodily fluids on them twice in a washing machine using very hot water.  Dispose of adult diapers, tampons, and sanitary napkins by first sealing them in a plastic bag.  Use a condom when having sex for at least 2 weeks after receiving your chemotherapy.  Do not share beverages or food.  Keep your chemotherapy medicines in their original bottles. Keep them in a high, safe location, away from children. Do not expose them to heat or moisture. Do not put them in containers with other types of medicines.  Dispose of all wrappers for your chemotherapy medicines by sealing them in a separate plastic bag.  Do not throw away extra medicine, and do not flush it down the toilet. Take medicine that you are not going to use to your health care provider's office where it can be disposed of properly.  Follow your health care provider's directions for the proper disposal of needles, IV tubing, and other medical supplies that have come into contact with your chemotherapy medicines.  If you are issued a hazardous waste container, make sure you understand the directions for using it.  Wash your hands thoroughly with warm water and soap after using the bathroom. Dry your hands with disposable paper towels.  When using the toilet:  Flush it twice after each use, including after vomiting.  Close the lid of the toilet prior to flushing. This helps to avoid splashing.  Both men and women should sit to use the toilet. This helps avoid splashing. WHAT ARE  THE SIDE EFFECTS OF CHEMOTHERAPY? Side effects depend on a variety of factors, including:  The specific type of chemotherapy medicine used.  The dosage.  How long the medicine is used for.  Your overall health. Some of the side effects you may experience include:  Fatigue and decreased energy.  Decreased appetite.  Changes in your sense of smell or taste.  Nausea.  Vomiting.  Constipation or diarrhea.  Hair  loss.  Increased susceptibility to infection.  Easy bleeding.  Mouth sores.  Burning or tingling in the hands or feet.  Memory problems.   This information is not intended to replace advice given to you by your health care provider. Make sure you discuss any questions you have with your health care provider.   Document Released: 05/05/2007 Document Revised: 07/29/2014 Document Reviewed: 12/14/2013 [employer] Interactive Patient Education 2016 [employer] Cisplatin injection What is this medicine? CISPLATIN (SIS pla tin) is a chemotherapy drug. It targets fast dividing cells, like cancer cells, and causes these cells to die. This medicine is used to treat many types of cancer like bladder, ovarian, and testicular cancers. This medicine may be used for other purposes; ask your health care provider or pharmacist if you have questions. What should I tell my health care provider before I take this medicine? They need to know if you have any of these conditions: -blood disorders -hearing problems -kidney disease -recent or ongoing radiation therapy -an unusual or allergic reaction to cisplatin, carboplatin, other chemotherapy, other medicines, foods, dyes, or preservatives -pregnant or trying to get pregnant -breast-feeding How should I use this medicine? This drug is given as an infusion into a vein. It is administered in a hospital or clinic by a specially trained health care professional. Talk to your pediatrician regarding the use of this medicine in children. Special care may be needed. Overdosage: If you think you have taken too much of this medicine contact a poison control center or emergency room at once. NOTE: This medicine is only for you. Do not share this medicine with others. What if I miss a dose? It is important not to miss a dose. Call your doctor or health care professional if you are unable to keep an appointment. What may interact with this  medicine? -dofetilide -foscarnet -medicines for seizures -medicines to increase blood counts like filgrastim, pegfilgrastim, sargramostim -probenecid -pyridoxine used with altretamine -rituximab -some antibiotics like amikacin, gentamicin, neomycin, polymyxin B, streptomycin, tobramycin -sulfinpyrazone -vaccines -zalcitabine Talk to your doctor or health care professional before taking any of these medicines: -acetaminophen -aspirin -ibuprofen -ketoprofen -naproxen This list may not describe all possible interactions. Give your health care provider a list of all the medicines, herbs, non-prescription drugs, or dietary supplements you use. Also tell them if you smoke, drink alcohol, or use illegal drugs. Some items may interact with your medicine. What should I watch for while using this medicine? Your condition will be monitored carefully while you are receiving this medicine. You will need important blood work done while you are taking this medicine. This drug may make you feel generally unwell. This is not uncommon, as chemotherapy can affect healthy cells as well as cancer cells. Report any side effects. Continue your course of treatment even though you feel ill unless your doctor tells you to stop. In some cases, you may be given additional medicines to help with side effects. Follow all directions for their use. Call your doctor or health care professional for advice if you get a fever, chills or  sore throat, or other symptoms of a cold or flu. Do not treat yourself. This drug decreases your body's ability to fight infections. Try to avoid being around people who are sick. This medicine may increase your risk to bruise or bleed. Call your doctor or health care professional if you notice any unusual bleeding. Be careful brushing and flossing your teeth or using a toothpick because you may get an infection or bleed more easily. If you have any dental work done, tell your dentist you are  receiving this medicine. Avoid taking products that contain aspirin, acetaminophen, ibuprofen, naproxen, or ketoprofen unless instructed by your doctor. These medicines may hide a fever. Do not become pregnant while taking this medicine. Women should inform their doctor if they wish to become pregnant or think they might be pregnant. There is a potential for serious side effects to an unborn child. Talk to your health care professional or pharmacist for more information. Do not breast-feed an infant while taking this medicine. Drink fluids as directed while you are taking this medicine. This will help protect your kidneys. Call your doctor or health care professional if you get diarrhea. Do not treat yourself. What side effects may I notice from receiving this medicine? Side effects that you should report to your doctor or health care professional as soon as possible: -allergic reactions like skin rash, itching or hives, swelling of the face, lips, or tongue -signs of infection - fever or chills, cough, sore throat, pain or difficulty passing urine -signs of decreased platelets or bleeding - bruising, pinpoint red spots on the skin, black, tarry stools, nosebleeds -signs of decreased red blood cells - unusually weak or tired, fainting spells, lightheadedness -breathing problems -changes in hearing -gout pain -low blood counts - This drug may decrease the number of white blood cells, red blood cells and platelets. You may be at increased risk for infections and bleeding. -nausea and vomiting -pain, swelling, redness or irritation at the injection site -pain, tingling, numbness in the hands or feet -problems with balance, movement -trouble passing urine or change in the amount of urine Side effects that usually do not require medical attention (report to your doctor or health care professional if they continue or are bothersome): -changes in vision -loss of appetite -metallic taste in the mouth  or changes in taste This list may not describe all possible side effects. Call your doctor for medical advice about side effects. You may report side effects to FDA at 1-800-FDA-1088. Where should I keep my medicine? This drug is given in a hospital or clinic and will not be stored at home. NOTE: This sheet is a summary. It may not cover all possible information. If you have questions about this medicine, talk to your doctor, pharmacist, or health care provider.    2016, [employer]. (2007-10-13 14:40:54) Etoposide, VP-16 injection What is this medicine? ETOPOSIDE, VP-16 (e toe POE side) is a chemotherapy drug. It is used to treat testicular cancer, lung cancer, and other cancers. This medicine may be used for other purposes; ask your health care provider or pharmacist if you have questions. What should I tell my health care provider before I take this medicine? They need to know if you have any of these conditions: -infection -kidney disease -low blood counts, like low white cell, platelet, or red cell counts -an unusual or allergic reaction to etoposide, other chemotherapeutic agents, other medicines, foods, dyes, or preservatives -pregnant or trying to get pregnant -breast-feeding How should I use  this medicine? This medicine is for infusion into a vein. It is administered in a hospital or clinic by a specially trained health care professional. Talk to your pediatrician regarding the use of this medicine in children. Special care may be needed. Overdosage: If you think you have taken too much of this medicine contact a poison control center or emergency room at once. NOTE: This medicine is only for you. Do not share this medicine with others. What if I miss a dose? It is important not to miss your dose. Call your doctor or health care professional if you are unable to keep an appointment. What may interact with this medicine? -aspirin -certain medications for seizures like  carbamazepine, phenobarbital, phenytoin, valproic acid -cyclosporine -levamisole -warfarin This list may not describe all possible interactions. Give your health care provider a list of all the medicines, herbs, non-prescription drugs, or dietary supplements you use. Also tell them if you smoke, drink alcohol, or use illegal drugs. Some items may interact with your medicine. What should I watch for while using this medicine? Visit your doctor for checks on your progress. This drug may make you feel generally unwell. This is not uncommon, as chemotherapy can affect healthy cells as well as cancer cells. Report any side effects. Continue your course of treatment even though you feel ill unless your doctor tells you to stop. In some cases, you may be given additional medicines to help with side effects. Follow all directions for their use. Call your doctor or health care professional for advice if you get a fever, chills or sore throat, or other symptoms of a cold or flu. Do not treat yourself. This drug decreases your body's ability to fight infections. Try to avoid being around people who are sick. This medicine may increase your risk to bruise or bleed. Call your doctor or health care professional if you notice any unusual bleeding. Be careful brushing and flossing your teeth or using a toothpick because you may get an infection or bleed more easily. If you have any dental work done, tell your dentist you are receiving this medicine. Avoid taking products that contain aspirin, acetaminophen, ibuprofen, naproxen, or ketoprofen unless instructed by your doctor. These medicines may hide a fever. Do not become pregnant while taking this medicine or for at least 6 months after stopping it. Women should inform their doctor if they wish to become pregnant or think they might be pregnant. Women of child-bearing potential will need to have a negative pregnancy test before starting this medicine. There is a  potential for serious side effects to an unborn child. Talk to your health care professional or pharmacist for more information. Do not breast-feed an infant while taking this medicine. Men must use a latex condom during sexual contact with a woman while taking this medicine and for at least 4 months after stopping it. A latex condom is needed even if you have had a vasectomy. Contact your doctor right away if your partner becomes pregnant. Do not donate sperm while taking this medicine and for at least 4 months after you stop taking this medicine. Men should inform their doctors if they wish to father a child. This medicine may lower sperm counts. What side effects may I notice from receiving this medicine? Side effects that you should report to your doctor or health care professional as soon as possible: -allergic reactions like skin rash, itching or hives, swelling of the face, lips, or tongue -low blood counts - this medicine  may decrease the number of white blood cells, red blood cells and platelets. You may be at increased risk for infections and bleeding. -signs of infection - fever or chills, cough, sore throat, pain or difficulty passing urine -signs of decreased platelets or bleeding - bruising, pinpoint red spots on the skin, black, tarry stools, blood in the urine -signs of decreased red blood cells - unusually weak or tired, fainting spells, lightheadedness -breathing problems -changes in vision -mouth or throat sores or ulcers -pain, redness, swelling or irritation at the injection site -pain, tingling, numbness in the hands or feet -redness, blistering, peeling or loosening of the skin, including inside the mouth -seizures -vomiting Side effects that usually do not require medical attention (report to your doctor or health care professional if they continue or are bothersome): -diarrhea -hair loss -loss of appetite -nausea -stomach pain This list may not describe all possible  side effects. Call your doctor for medical advice about side effects. You may report side effects to FDA at 1-800-FDA-1088. Where should I keep my medicine? This drug is given in a hospital or clinic and will not be stored at home. NOTE: This sheet is a summary. It may not cover all possible information. If you have questions about this medicine, talk to your doctor, pharmacist, or health care provider.    2016, [employer]. (2014-03-03 12:32:50)

## 2015-12-13 NOTE — Progress Notes (Signed)
[facility]  PROGRESS NOTE  Patient Care Team: Chevis Pretty, FNP as PCP - General (Nurse Practitioner)  CHIEF COMPLAINTS/PURPOSE OF CONSULTATION:  Limited stage small cell lung cancer    Small cell carcinoma of right lung ([place])   11/28/2015 Imaging Large anterior mediastinal mass in continuity with a R hilar mass, narrowing of SVC, abnormal densities in RUL, 9 mm spiculated nodule, nonspec. hypodensity in liver   12/05/2015 PET scan Large hypermetabolic paratracheal mass c/w SCLC, perihilar nodular densities in RML, mild metabolic activity RUL nodule, no distant metastatic disease   12/11/2015 Procedure Video bronch with biopsies and brushings, endobronchial ultrasound with mediastinal LN aspiration. Dr. Roxan Hockey   12/11/2015 Pathology Results Trachea biopsy negative, FNA RUL malignanc cells c/w [facility]   12/13/2015 -  Chemotherapy Cisplatin/Etoposide     HISTORY OF PRESENTING ILLNESS:  Janet Fletcher 80 y.o. female is here for cycle #1 of cisplatin/etoposide for limited stage SCLC. Ms. Malay is accompanied by her two daughters today. Presents in treatment bed. I personally reviewed and went over pathology results with the patient. She has not spoken with a physician about the new lung cancer diagnosis yet.  The patient is here for further evaluation and discussion of newly diagnosed limited stage small cell lung cancer. We discussed treatment options including concurrent radiation therapy and chemotherapy. I reviewed the side effects as well as which symptoms are not abnormal and would necessitate a trip to the ER or contacting the [facility].   Her daughter believes the patient's biggest downfall will be how other people view her and her appearance. Further explaining, she is so used to being busy and active, it will be hard for her to accept other people's help doing things for her so she can rest. Patient notes she is upset about losing her hair.  She remarks  she needs time to absorb her diagnosis.  Currently only complaint is cough. She denies headaches, visual changes. No nausea, vomiting. No change in breathing, no facial swelling.  MEDICAL HISTORY:  Past Medical History  Diagnosis Date  . Hyperlipidemia   . Allergy   . Osteopenia     SURGICAL HISTORY: Past Surgical History  Procedure Laterality Date  . Small intestine surgery    . Cataract extraction, bilateral Bilateral   . Minor excision ear canal cyst Left     30+ years ago  . Video bronchoscopy with endobronchial ultrasound N/A 12/11/2015    Procedure: VIDEO BRONCHOSCOPY WITH ENDOBRONCHIAL ULTRASOUND;  Surgeon: Melrose Nakayama, MD;  Location: [place];  Service: Thoracic;  Laterality: N/A;    SOCIAL HISTORY: Social History   Social History  . Marital Status: Married    Spouse Name: N/A  . Number of Children: N/A  . Years of Education: N/A   Occupational History  . Not on file.   Social History Main Topics  . Smoking status: Current Some Day Smoker -- 0.25 packs/day for 50 years  . Smokeless tobacco: Never Used  . Alcohol Use: No  . Drug Use: No  . Sexual Activity: Not on file   Other Topics Concern  . Not on file   Social History Narrative  Married; 12 years 5 daughters 2 sons 12 grandchildren, 8 great grandchildren Smokes now but not as much; started long time ago. 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  Cancer Mother   . Hypertension Father   Mother died at 31 years old. Father died at 65 years old. 7 sisters and 7 brothers 14 living Older sister died of leukemia.   ALLERGIES:  is allergic to acetaminophen; flagyl; and penicillins.  MEDICATIONS:  Current Outpatient Prescriptions  Medication Sig Dispense Refill  . cefdinir (OMNICEF) 300 MG capsule Take 300 mg by mouth 2 (two) times daily.      Marland Kitchen CISPLATIN IV Inject into the vein. Day 1 every 21 days (to begin 12/13/15)    . ETOPOSIDE IV Inject into the vein. Days 1-3 every 21 days (to begin 12/13/15)    . lidocaine-prilocaine (EMLA) cream Apply a quarter size amount to port site 1 hour prior to chemo. Do not rub in. Cover with plastic wrap. 30 g 3  . ondansetron (ZOFRAN) 8 MG tablet Take 1 tablet (8 mg total) by mouth every 8 (eight) hours as needed for nausea or vomiting. 30 tablet 2  . Pegfilgrastim (NEULASTA ONPRO [place]) Inject into the skin. To be administered 27 hours after the completion of chemo.    . prochlorperazine (COMPAZINE) 10 MG tablet Take 1 tablet (10 mg total) by mouth every 6 (six) hours as needed for nausea or vomiting. 30 tablet 2  . simvastatin (ZOCOR) 40 MG tablet Take 1 tablet (40 mg total) by mouth at bedtime. 90 tablet 3   No current facility-administered medications for this visit.   Facility-Administered Medications Ordered in Other Visits  Medication Dose Route Frequency Provider Last Rate Last Dose  . CISplatin (PLATINOL) 125 mg in sodium chloride 0.9 % 500 mL chemo infusion  80 mg/m2 (Treatment Plan Actual) Intravenous Once Patrici Ranks, MD 625 mL/hr at 12/13/15 1200 125 mg at 12/13/15 1200  . etoposide (VEPESID) 160 mg in sodium chloride 0.9 % 500 mL chemo infusion  100 mg/m2 (Treatment Plan Actual) Intravenous Once Patrici Ranks, MD      . heparin lock flush 100 unit/mL  500 Units Intracatheter Once PRN Patrici Ranks, MD      . sodium chloride flush (NS) 0.9 % injection 10 mL  10 mL Intracatheter PRN Patrici Ranks, MD        Review of Systems  Constitutional: Positive for weight loss. Negative for fever, chills and malaise/fatigue.       Appetite loss.  HENT: Negative.  Negative for congestion, hearing loss, nosebleeds, sore throat and tinnitus.   Eyes: Negative.  Negative for blurred vision, double vision, pain and discharge.  Respiratory: Positive for cough. Negative for hemoptysis,  sputum production, shortness of breath and wheezing.   Cardiovascular: Negative.  Negative for chest pain, palpitations, claudication, leg swelling and PND.  Gastrointestinal: Negative.  Negative for heartburn, nausea, vomiting, abdominal pain, diarrhea, constipation, blood in stool and melena.  Genitourinary: Negative.  Negative for dysuria, urgency, frequency and hematuria.  Musculoskeletal: Negative.  Negative for myalgias, joint pain and falls.  Skin: Negative.  Negative for itching and rash.  Neurological: Negative.  Negative for dizziness, tingling, tremors, sensory change, speech change, focal weakness, seizures, loss of consciousness, weakness and headaches.  Endo/Heme/Allergies: Negative.  Does not bruise/bleed easily.  Psychiatric/Behavioral: Negative.  Negative for depression, suicidal ideas, memory loss and substance abuse. The patient is not nervous/anxious and does not have insomnia.   All other systems reviewed and are negative.  14 point ROS was done and is otherwise as detailed above or in HPI    PHYSICAL EXAMINATION: ECOG PERFORMANCE STATUS: 1 -  Symptomatic but completely ambulatory Vitals with BMI 12/13/2015  Height   Weight 123 lbs 8 oz  BMI   Systolic 865  Diastolic 56  Pulse 64  Respirations 18    Physical Exam  Constitutional: She is oriented to person, place, and time and well-developed, well-nourished, and in no distress.  HENT:  Head: Normocephalic and atraumatic.  Nose: Nose normal.  Mouth/Throat: Oropharynx is clear and moist. No oropharyngeal exudate.  Eyes: Conjunctivae and EOM are normal. Pupils are equal, round, and reactive to light. Right eye exhibits no discharge. Left eye exhibits no discharge. No scleral icterus.  Neck: Normal range of motion. Neck supple. No tracheal deviation present. No thyromegaly present.  Cardiovascular: Normal rate, regular rhythm and normal heart sounds.  Exam reveals no gallop and no friction rub.   No murmur  heard. Pulmonary/Chest: Effort normal and breath sounds normal. She has no wheezes. She has no rales.  Abdominal: Soft. Bowel sounds are normal. She exhibits no distension and no mass. There is no tenderness. There is no rebound and no guarding.  Musculoskeletal: Normal range of motion. She exhibits no edema.  Lymphadenopathy:    She has no cervical adenopathy.  Neurological: She is alert and oriented to person, place, and time. She has normal reflexes. No cranial nerve deficit. Gait normal. Coordination normal.  Skin: Skin is warm and dry. No rash noted.  Psychiatric: Mood, memory, affect and judgment normal.  Nursing note and vitals reviewed.   LABORATORY DATA:  I have reviewed the data as listed Lab Results  Component Value Date   WBC 5.5 12/13/2015   HGB 9.1* 12/13/2015   HCT 30.3* 12/13/2015   MCV 78.9 12/13/2015   PLT 193 12/13/2015   CMP     Component Value Date/Time   NA 140 12/13/2015 0930   NA 143 09/29/2014 1203   K 3.9 12/13/2015 0930   CL 106 12/13/2015 0930   CO2 28 12/13/2015 0930   GLUCOSE 82 12/13/2015 0930   GLUCOSE 78 09/29/2014 1203   BUN 15 12/13/2015 0930   BUN 12 09/29/2014 1203   CREATININE 0.79 12/13/2015 0930   CREATININE 0.76 01/06/2013 1428   CALCIUM 8.8* 12/13/2015 0930   PROT 6.5 12/13/2015 0930   PROT 6.6 09/29/2014 1203   ALBUMIN 3.3* 12/13/2015 0930   ALBUMIN 3.9 09/29/2014 1203   AST 26 12/13/2015 0930   ALT 13* 12/13/2015 0930   ALKPHOS 53 12/13/2015 0930   BILITOT 0.3 12/13/2015 0930   BILITOT 0.4 09/29/2014 1203   GFRNONAA >60 12/13/2015 0930   GFRNONAA 78 01/06/2013 1428   GFRAA >60 12/13/2015 0930   GFRAA 89 01/06/2013 1428    RADIOGRAPHIC STUDIES: I have personally reviewed the radiological images as listed and agreed with the findings in the report. Dg Chest 2 View  12/11/2015  CLINICAL DATA:  Preop for mediastinal mass EXAM: CHEST  2 VIEW COMPARISON:  12/05/2015 and 11/27/2015 FINDINGS: Cardiomediastinal silhouette is  stable. Right paratracheal soft tissue prominence again noted. Atherosclerotic calcifications of thoracic aorta. No infiltrate or pulmonary edema. Hyperinflation and mild emphysematous changes. IMPRESSION: Right paratracheal soft tissue prominence again noted. No infiltrate or pulmonary edema. Hyperinflation and mild emphysematous changes. Electronically Signed   By: Lahoma Crocker M.D.   On: 12/11/2015 08:18   Ir Fluoro Guide Cv Line Right  12/12/2015  CLINICAL DATA:  80 year old female with a history of small cell carcinoma. EXAM: IR RIGHT FLOURO GUIDE CV LINE; IR ULTRASOUND GUIDANCE VASC ACCESS RIGHT Date: 12/12/2015 ANESTHESIA/SEDATION: Moderate (  conscious) sedation was administered during this procedure. A total of 1.0 mg Versed and 50 mg Fentanyl were administered intravenously. The patient's vital signs were monitored continuously by radiology nursing throughout the course of the procedure. Total sedation time: 19 minutes FLUOROSCOPY TIME:  Zero minutes 6 second TECHNIQUE: The procedure, risks, benefits, and alternatives were explained to the patient. Questions regarding the procedure were encouraged and answered. The patient understands and consents to the procedure. Ultrasound survey was performed with images stored and sent to PACs. The right neck and chest was prepped with chlorhexidine, and draped in the usual sterile fashion using maximum barrier technique (cap and mask, sterile gown, sterile gloves, large sterile sheet, hand hygiene and cutaneous antiseptic). Antibiotic prophylaxis was provided with 1.0g vancomycin administered IV one hour prior to skin incision. Local anesthesia was attained by infiltration with 1% lidocaine without epinephrine. Ultrasound demonstrated patency of the right internal jugular vein, and this was documented with an image. Under real-time ultrasound guidance, this vein was accessed with a 21 gauge micropuncture needle and image documentation was performed. A small dermatotomy  was made at the access site with an 11 scalpel. A 0.018" wire was advanced into the SVC and used to estimate the length of the internal catheter. The access needle exchanged for a 1F micropuncture vascular sheath. The 0.018" wire was then removed and a 0.035" wire advanced into the IVC. An appropriate location for the subcutaneous reservoir was selected below the clavicle and an incision was made through the skin and underlying soft tissues. The subcutaneous tissues were then dissected using a combination of blunt and sharp surgical technique and a pocket was formed. A single lumen power injectable portacatheter was then tunneled through the subcutaneous tissues from the pocket to the dermatotomy and the port reservoir placed within the subcutaneous pocket. The venous access site was then serially dilated and a peel away vascular sheath placed over the wire. The wire was removed and the port catheter advanced into position under fluoroscopic guidance. The catheter tip is positioned in the cavoatrial junction. This was documented with a spot image. The portacatheter was then tested and found to flush and aspirate well. The port was flushed with saline followed by 100 units/mL heparinized saline. The pocket was then closed in two layers using first subdermal inverted interrupted absorbable sutures followed by a running subcuticular suture. The epidermis was then sealed with Dermabond. The dermatotomy at the venous access site was also seal with Dermabond. Patient tolerated the procedure well and remained hemodynamically stable throughout. No complications encountered and no significant blood loss encountered COMPLICATIONS: None.  The patient tolerated the procedure well. IMPRESSION: Status post right IJ port catheter placement. Catheter ready for use. Signed, Dulcy Fanny. Earleen Newport, DO [facility] Radiology Electronically Signed   By: Corrie Mckusick D.O.   On: 12/12/2015 17:44    Ir US Guide Vasc Access Right  12/12/2015  CLINICAL DATA:  80 year old female with a history of small cell carcinoma. EXAM: IR RIGHT FLOURO GUIDE CV LINE; IR ULTRASOUND GUIDANCE VASC ACCESS RIGHT Date: 12/12/2015 ANESTHESIA/SEDATION: Moderate (conscious) sedation was administered during this procedure. A total of 1.0 mg Versed and 50 mg Fentanyl were administered intravenously. The patient's vital signs were monitored continuously by radiology nursing throughout the course of the procedure. Total sedation time: 19 minutes FLUOROSCOPY TIME:  Zero minutes 6 second TECHNIQUE: The procedure, risks, benefits, and alternatives were explained to the patient. Questions regarding the procedure were encouraged and answered. The patient  understands and consents to the procedure. Ultrasound survey was performed with images stored and sent to PACs. The right neck and chest was prepped with chlorhexidine, and draped in the usual sterile fashion using maximum barrier technique (cap and mask, sterile gown, sterile gloves, large sterile sheet, hand hygiene and cutaneous antiseptic). Antibiotic prophylaxis was provided with 1.0g vancomycin administered IV one hour prior to skin incision. Local anesthesia was attained by infiltration with 1% lidocaine without epinephrine. Ultrasound demonstrated patency of the right internal jugular vein, and this was documented with an image. Under real-time ultrasound guidance, this vein was accessed with a 21 gauge micropuncture needle and image documentation was performed. A small dermatotomy was made at the access site with an 11 scalpel. A 0.018" wire was advanced into the SVC and used to estimate the length of the internal catheter. The access needle exchanged for a 81F micropuncture vascular sheath. The 0.018" wire was then removed and a 0.035" wire advanced into the IVC. An appropriate location for the subcutaneous reservoir was selected below the clavicle and an incision was made through  the skin and underlying soft tissues. The subcutaneous tissues were then dissected using a combination of blunt and sharp surgical technique and a pocket was formed. A single lumen power injectable portacatheter was then tunneled through the subcutaneous tissues from the pocket to the dermatotomy and the port reservoir placed within the subcutaneous pocket. The venous access site was then serially dilated and a peel away vascular sheath placed over the wire. The wire was removed and the port catheter advanced into position under fluoroscopic guidance. The catheter tip is positioned in the cavoatrial junction. This was documented with a spot image. The portacatheter was then tested and found to flush and aspirate well. The port was flushed with saline followed by 100 units/mL heparinized saline. The pocket was then closed in two layers using first subdermal inverted interrupted absorbable sutures followed by a running subcuticular suture. The epidermis was then sealed with Dermabond. The dermatotomy at the venous access site was also seal with Dermabond. Patient tolerated the procedure well and remained hemodynamically stable throughout. No complications encountered and no significant blood loss encountered COMPLICATIONS: None.  The patient tolerated the procedure well. IMPRESSION: Status post right IJ port catheter placement. Catheter ready for use. Signed, Dulcy Fanny. Earleen Newport, DO [facility] Radiology Electronically Signed   By: Corrie Mckusick D.O.   On: 12/12/2015 17:44   Dg Chest Port 1 View  12/11/2015  CLINICAL DATA:  Laryngospasm EXAM: PORTABLE CHEST 1 VIEW COMPARISON:  12/11/2015, 12/05/2015 FINDINGS: Right peritracheal fullness compatible with known hypermetabolic mass concerning for malignancy. Stable mild cardiac enlargement. Vascular pattern normal. Lungs clear. Bilateral nipple shadows noted. IMPRESSION: Stable soft tissue fullness right paratracheal region.  Electronically Signed   By: Skipper Cliche M.D.   On: 12/11/2015 12:43     Study Result     CLINICAL DATA: Initial treatment strategy for mediastinal mass.  EXAM: NUCLEAR MEDICINE PET SKULL BASE TO THIGH  TECHNIQUE: Thirteen mCi F-18 FDG was injected intravenously. Full-ring PET imaging was performed from the skull base to thigh after the radiotracer. CT data was obtained and used for attenuation correction and anatomic localization.  FASTING BLOOD GLUCOSE: Value: 83 mg/dl  COMPARISON: CT 11/28/2015  FINDINGS: NECK  Hypermetabolic nodules in the central RIGHT upper lobe measuring approximately 1 cm each (image 99, series 3) with intense metabolic activity (SUV max 11.9). Small nodule in the RIGHT upper lobe medially measuring  10 mm on image 90, series 3 has mild metabolic activity.  The large RIGHT paratracheal mass measures 7 cm with intense peripheral metabolic activity with SUV max equal 12.9. This metabolic activity extends to the high RIGHT paratracheal location. Mass surrounds the SVC.  No LEFT lung hypermetabolic nodules.  CHEST  No hypermetabolic mediastinal or hilar nodes. No suspicious pulmonary nodules on the CT scan.  ABDOMEN/PELVIS  No abnormal hypermetabolic activity within the liver, pancreas, adrenal glands, or spleen. No hypermetabolic lymph nodes in the abdomen or pelvis. There is dense sclerosis of the aorta which extends into the lumen. Uterus normal.  SKELETON  No focal hypermetabolic activity to suggest skeletal metastasis.  IMPRESSION: 1. Large intensely hypermetabolic paratracheal mass most consistent with metastatic small cell lung carcinoma. 2. Perihilar nodular densities in the RIGHT middle lobe are likely pulmonary metastasis. 3. Mild metabolic activity associated are RIGHT upper lobe nodule may represent primary lesion. 4. No evidence of distant metastatic disease. 5. Dense sclerosis of the aorta is extends  into the lumen of the infrarenal abdominal aorta LEFT common iliac artery.   Electronically Signed  By: Suzy Bouchard M.D.  On: 12/05/2015 17:19    PATHOLOGY    ASSESSMENT & PLAN:  Limited stage small cell lung cancer Tobacco Abuse Anemia   We discussed SCLC in detail. We discussed limited vs. Extensive stage disease. 2 of her daughters were present today. We reviewed side effects of chemotherapy including but not limited to nausea, vomiting, increased risk of infection, decreased blood counts, hair loss.  We reviewed fever and definition of fever during chemotherapy and what to do if she develops fever during chemotherapy. I reviewed how to use her anti-emetics.   She will be referred to see Dr. Tammi Klippel in [place] next week for consideration of concurrent XRT.  In regards to her anemia B12, folate and ferritin have been obtained.   I addressed the importance of smoking cessation with the patient in detail.  We discussed the health benefits of cessation.  We reviewed the multiple options for cessation and I offered to refer her to smoking cessation classes. We discussed other alternatives to quit such as chantix, wellbutrin. We will continue to address this moving forward. I have strongly urged her to quit.   She will return for follow up next week to assess tolerance and answer additional questions she or her family may have.  Orders Placed This Encounter  Procedures  . CBC With Differential    Standing Status: Future     Number of Occurrences:      Standing Expiration Date: 12/12/2016     All questions were answered. The patient knows to call the clinic with any problems, questions or concerns.  This document serves as a record of services personally performed by Ancil Linsey, MD. It was created on her behalf by Arlyce Harman, a trained medical scribe. The creation of this record is based on the scribe's personal observations and the provider's statements to them.  This document has been checked and approved by the attending provider.  I have reviewed the above documentation for accuracy and completeness, and I agree with the above.  This note was electronically signed.  Molli Hazard, MD  12/13/2015 12:34 PM

## 2015-12-14 ENCOUNTER — Encounter (HOSPITAL_COMMUNITY): Payer: Commercial Managed Care - HMO | Attending: Hematology & Oncology

## 2015-12-14 VITALS — BP 136/55 | HR 72 | Temp 98.3°F | Resp 18

## 2015-12-14 DIAGNOSIS — C3491 Malignant neoplasm of unspecified part of right bronchus or lung: Secondary | ICD-10-CM | POA: Diagnosis not present

## 2015-12-14 DIAGNOSIS — Z5111 Encounter for antineoplastic chemotherapy: Secondary | ICD-10-CM | POA: Diagnosis not present

## 2015-12-14 DIAGNOSIS — D649 Anemia, unspecified: Secondary | ICD-10-CM | POA: Diagnosis not present

## 2015-12-14 LAB — FOLATE: FOLATE: 16.7 ng/mL (ref >5.9)

## 2015-12-14 LAB — VITAMIN B12: Vitamin B-12: 225 pg/mL (ref 180–914)

## 2015-12-14 MED ORDER — SODIUM CHLORIDE 0.9 % IV SOLN
Freq: Once | INTRAVENOUS | Status: AC
  Administered 2015-12-14: 10:00:00 via INTRAVENOUS

## 2015-12-14 MED ORDER — SODIUM CHLORIDE 0.9 % IV SOLN
100.0000 mg/m2 | Freq: Once | INTRAVENOUS | Status: AC
  Administered 2015-12-14: 160 mg via INTRAVENOUS
  Filled 2015-12-14: qty 8

## 2015-12-14 MED ORDER — SODIUM CHLORIDE 0.9% FLUSH
10.0000 mL | INTRAVENOUS | Status: DC | PRN
  Administered 2015-12-14: 10 mL
  Filled 2015-12-14: qty 10

## 2015-12-14 MED ORDER — SODIUM CHLORIDE 0.9 % IV SOLN
10.0000 mg | Freq: Once | INTRAVENOUS | Status: AC
  Administered 2015-12-14: 10 mg via INTRAVENOUS
  Filled 2015-12-14: qty 1

## 2015-12-14 MED ORDER — HEPARIN SOD (PORK) LOCK FLUSH 100 UNIT/ML IV SOLN
500.0000 [IU] | Freq: Once | INTRAVENOUS | Status: AC | PRN
  Administered 2015-12-14: 500 [IU]

## 2015-12-14 NOTE — Progress Notes (Signed)
1200:  Tolerated tx w/o adverse reaction.  A&Ox4, in no distress.  VSS.  Discharged ambulatory in c/o family for transport home.

## 2015-12-14 NOTE — Patient Instructions (Signed)
[facility] Discharge Instructions for Patients Receiving Chemotherapy   Beginning January 23rd 2017 lab work for the [facility] will be done in the  Main lab at [facility] on 1st floor. If you have a lab appointment with the [facility] please come in thru the  Main Entrance and check in at the main information desk   Today you received the following chemotherapy agents:  Etoposide.  If you develop nausea and vomiting, or diarrhea that is not controlled by your medication, call the clinic.  The clinic phone number is [phone]. Office hours are Monday-Friday 8:30am-5:00pm.  BELOW ARE SYMPTOMS THAT SHOULD BE REPORTED IMMEDIATELY:  *FEVER GREATER THAN 101.0 F  *CHILLS WITH OR WITHOUT FEVER  NAUSEA AND VOMITING THAT IS NOT CONTROLLED WITH YOUR NAUSEA MEDICATION  *UNUSUAL SHORTNESS OF BREATH  *UNUSUAL BRUISING OR BLEEDING  TENDERNESS IN MOUTH AND THROAT WITH OR WITHOUT PRESENCE OF ULCERS  *URINARY PROBLEMS  *BOWEL PROBLEMS  UNUSUAL RASH Items with * indicate a potential emergency and should be followed up as soon as possible. If you have an emergency after office hours please contact your primary care physician or go to the nearest emergency department.  Please call the clinic during office hours if you have any questions or concerns.   You may also contact the Patient Navigator at [phone] should you have any questions or need assistance in obtaining follow up care.      Resources For Cancer Patients and their Caregivers ? American Cancer Society: Can assist with transportation, wigs, general needs, runs Look Good Feel Better.        404-082-3361 ? Cancer Care: Provides financial assistance, online support groups, medication/co-pay assistance.  1-800-813-HOPE (367)042-8214) ? [facility] Assists [place] cancer patients and their families through emotional , educational and financial support.   413-849-9864 ? [place] DSS Where to apply for food stamps, Medicaid and utility assistance. (629) 168-0723 ? [facility]: Transportation to medical appointments. (505)069-0677 ? Social Security Administration: May apply for disability if have a Stage IV cancer. 418 208 0468 514-740-9773 ? [employer], Disability and Transit Services: Assists with nutrition, care and transit needs. 6197293339

## 2015-12-15 ENCOUNTER — Encounter (HOSPITAL_BASED_OUTPATIENT_CLINIC_OR_DEPARTMENT_OTHER): Payer: Commercial Managed Care - HMO

## 2015-12-15 ENCOUNTER — Encounter (HOSPITAL_COMMUNITY): Payer: Self-pay

## 2015-12-15 ENCOUNTER — Encounter (HOSPITAL_COMMUNITY): Payer: Self-pay | Admitting: Hematology & Oncology

## 2015-12-15 VITALS — BP 154/54 | HR 58 | Temp 98.5°F | Resp 16 | Wt 132.6 lb

## 2015-12-15 DIAGNOSIS — C3490 Malignant neoplasm of unspecified part of unspecified bronchus or lung: Secondary | ICD-10-CM | POA: Diagnosis not present

## 2015-12-15 DIAGNOSIS — E538 Deficiency of other specified B group vitamins: Secondary | ICD-10-CM

## 2015-12-15 DIAGNOSIS — D649 Anemia, unspecified: Secondary | ICD-10-CM | POA: Insufficient documentation

## 2015-12-15 DIAGNOSIS — Z720 Tobacco use: Secondary | ICD-10-CM | POA: Insufficient documentation

## 2015-12-15 DIAGNOSIS — C3491 Malignant neoplasm of unspecified part of right bronchus or lung: Secondary | ICD-10-CM

## 2015-12-15 DIAGNOSIS — Z9889 Other specified postprocedural states: Secondary | ICD-10-CM | POA: Diagnosis not present

## 2015-12-15 DIAGNOSIS — D509 Iron deficiency anemia, unspecified: Secondary | ICD-10-CM

## 2015-12-15 DIAGNOSIS — Z888 Allergy status to other drugs, medicaments and biological substances status: Secondary | ICD-10-CM | POA: Diagnosis not present

## 2015-12-15 DIAGNOSIS — R7989 Other specified abnormal findings of blood chemistry: Secondary | ICD-10-CM

## 2015-12-15 DIAGNOSIS — E785 Hyperlipidemia, unspecified: Secondary | ICD-10-CM | POA: Diagnosis not present

## 2015-12-15 DIAGNOSIS — F17210 Nicotine dependence, cigarettes, uncomplicated: Secondary | ICD-10-CM | POA: Diagnosis not present

## 2015-12-15 DIAGNOSIS — M8580 Other specified disorders of bone density and structure, unspecified site: Secondary | ICD-10-CM | POA: Diagnosis not present

## 2015-12-15 DIAGNOSIS — Z5111 Encounter for antineoplastic chemotherapy: Secondary | ICD-10-CM | POA: Diagnosis not present

## 2015-12-15 HISTORY — DX: Deficiency of other specified B group vitamins: E53.8

## 2015-12-15 HISTORY — DX: Iron deficiency anemia, unspecified: D50.9

## 2015-12-15 HISTORY — DX: Other specified abnormal findings of blood chemistry: R79.89

## 2015-12-15 LAB — FERRITIN: FERRITIN: 30 ng/mL (ref 11–307)

## 2015-12-15 MED ORDER — PEGFILGRASTIM 6 MG/0.6ML ~~LOC~~ PSKT
PREFILLED_SYRINGE | SUBCUTANEOUS | Status: AC
  Filled 2015-12-15: qty 0.6

## 2015-12-15 MED ORDER — CYANOCOBALAMIN 1000 MCG/ML IJ SOLN
1000.0000 ug | Freq: Once | INTRAMUSCULAR | Status: AC
Start: 2015-12-15 — End: 2015-12-15
  Administered 2015-12-15: 1000 ug via INTRAMUSCULAR

## 2015-12-15 MED ORDER — POLYSACCHAR IRON-FA-B12 150-1-25 MG-MG-MCG PO CAPS: 1.0000 | ORAL_CAPSULE | Freq: Every day | ORAL | Status: DC

## 2015-12-15 MED ORDER — HEPARIN SOD (PORK) LOCK FLUSH 100 UNIT/ML IV SOLN
INTRAVENOUS | Status: AC
  Filled 2015-12-15: qty 5

## 2015-12-15 MED ORDER — HEPARIN SOD (PORK) LOCK FLUSH 100 UNIT/ML IV SOLN
500.0000 [IU] | Freq: Once | INTRAVENOUS | Status: AC | PRN
  Administered 2015-12-15: 500 [IU]

## 2015-12-15 MED ORDER — CYANOCOBALAMIN 1000 MCG/ML IJ SOLN
INTRAMUSCULAR | Status: AC
  Filled 2015-12-15: qty 1

## 2015-12-15 MED ORDER — PEGFILGRASTIM 6 MG/0.6ML ~~LOC~~ PSKT
6.0000 mg | PREFILLED_SYRINGE | Freq: Once | SUBCUTANEOUS | Status: AC
  Administered 2015-12-15: 6 mg via SUBCUTANEOUS

## 2015-12-15 MED ORDER — SODIUM CHLORIDE 0.9 % IV SOLN
100.0000 mg/m2 | Freq: Once | INTRAVENOUS | Status: AC
  Administered 2015-12-15: 160 mg via INTRAVENOUS
  Filled 2015-12-15: qty 8

## 2015-12-15 MED ORDER — SODIUM CHLORIDE 0.9 % IV SOLN
Freq: Once | INTRAVENOUS | Status: AC
  Administered 2015-12-15: 10:00:00 via INTRAVENOUS

## 2015-12-15 MED ORDER — SODIUM CHLORIDE 0.9% FLUSH: 10.0000 mL | INTRAVENOUS | Status: DC | PRN

## 2015-12-15 MED ORDER — SODIUM CHLORIDE 0.9 % IV SOLN
10.0000 mg | Freq: Once | INTRAVENOUS | Status: AC
  Administered 2015-12-15: 10 mg via INTRAVENOUS
  Filled 2015-12-15: qty 1

## 2015-12-15 NOTE — Patient Instructions (Signed)
[facility] Discharge Instructions for Patients Receiving Chemotherapy   Beginning January 23rd 2017 lab work for the [facility] will be done in the  Main lab at [facility] on 1st floor. If you have a lab appointment with the [facility] please come in thru the  Main Entrance and check in at the main information desk   Today you received the following chemotherapy agent: Etoposide.     If you develop nausea and vomiting, or diarrhea that is not controlled by your medication, call the clinic.  The clinic phone number is [phone]. Office hours are Monday-Friday 8:30am-5:00pm.  BELOW ARE SYMPTOMS THAT SHOULD BE REPORTED IMMEDIATELY:  *FEVER GREATER THAN 101.0 F  *CHILLS WITH OR WITHOUT FEVER  NAUSEA AND VOMITING THAT IS NOT CONTROLLED WITH YOUR NAUSEA MEDICATION  *UNUSUAL SHORTNESS OF BREATH  *UNUSUAL BRUISING OR BLEEDING  TENDERNESS IN MOUTH AND THROAT WITH OR WITHOUT PRESENCE OF ULCERS  *URINARY PROBLEMS  *BOWEL PROBLEMS  UNUSUAL RASH Items with * indicate a potential emergency and should be followed up as soon as possible. If you have an emergency after office hours please contact your primary care physician or go to the nearest emergency department.  Please call the clinic during office hours if you have any questions or concerns.   You may also contact the Patient Navigator at [phone] should you have any questions or need assistance in obtaining follow up care.      Resources For Cancer Patients and their Caregivers ? American [employer] Society: Can assist with transportation, wigs, general needs, runs Look Good Feel Better.        (831)255-2832 ? Cancer Care: Provides financial assistance, online support groups, medication/co-pay assistance.  1-800-813-HOPE (620)472-1402) ? [facility] Assists [place] cancer patients and their families through emotional , educational and financial support.   (475)830-0501 ? [place] DSS Where to apply for food stamps, Medicaid and utility assistance. 470 624 3038 ? [facility]: Transportation to medical appointments. 845-157-8464 ? Social Security Administration: May apply for disability if have a Stage IV cancer. 912-541-1333 480-326-4920 ? [employer], Disability and Transit Services: Assists with nutrition, care and transit needs. 575-493-2315

## 2015-12-15 NOTE — Progress Notes (Signed)
Patient tolerated infusion well.  VSS.   Patient and her granddaughters were educated on the Neulasta OnPro.  They verbalized understanding.

## 2015-12-19 ENCOUNTER — Telehealth (HOSPITAL_COMMUNITY): Payer: Self-pay | Admitting: Emergency Medicine

## 2015-12-19 LAB — INTRINSIC FACTOR ANTIBODIES: Intrinsic Factor: 0.9 AU/mL (ref 0.0–1.1)

## 2015-12-19 LAB — ANTI-PARIETAL ANTIBODY: PARIETAL CELL ANTIBODY-IGG: 2 U (ref 0.0–20.0)

## 2015-12-19 NOTE — Telephone Encounter (Signed)
24 hour follow up-pt states that she has tolerated chemotherapy well, no problems.

## 2015-12-20 DIAGNOSIS — E785 Hyperlipidemia, unspecified: Secondary | ICD-10-CM | POA: Diagnosis not present

## 2015-12-20 DIAGNOSIS — F17210 Nicotine dependence, cigarettes, uncomplicated: Secondary | ICD-10-CM | POA: Diagnosis not present

## 2015-12-20 DIAGNOSIS — Z886 Allergy status to analgesic agent status: Secondary | ICD-10-CM | POA: Diagnosis not present

## 2015-12-20 DIAGNOSIS — Z881 Allergy status to other antibiotic agents status: Secondary | ICD-10-CM | POA: Diagnosis not present

## 2015-12-20 DIAGNOSIS — M8580 Other specified disorders of bone density and structure, unspecified site: Secondary | ICD-10-CM | POA: Diagnosis not present

## 2015-12-20 DIAGNOSIS — Z880 Allergy status to penicillin: Secondary | ICD-10-CM | POA: Diagnosis not present

## 2015-12-20 DIAGNOSIS — Z79899 Other long term (current) drug therapy: Secondary | ICD-10-CM | POA: Diagnosis not present

## 2015-12-20 DIAGNOSIS — C3411 Malignant neoplasm of upper lobe, right bronchus or lung: Secondary | ICD-10-CM | POA: Diagnosis not present

## 2015-12-21 ENCOUNTER — Encounter (HOSPITAL_COMMUNITY): Payer: Self-pay | Admitting: Oncology

## 2015-12-21 ENCOUNTER — Encounter (HOSPITAL_COMMUNITY): Payer: Commercial Managed Care - HMO | Attending: Oncology | Admitting: Oncology

## 2015-12-21 ENCOUNTER — Encounter (HOSPITAL_COMMUNITY): Payer: Commercial Managed Care - HMO

## 2015-12-21 VITALS — BP 113/60 | HR 63 | Temp 97.7°F | Resp 20 | Wt 120.2 lb

## 2015-12-21 DIAGNOSIS — D649 Anemia, unspecified: Secondary | ICD-10-CM | POA: Insufficient documentation

## 2015-12-21 DIAGNOSIS — Z9889 Other specified postprocedural states: Secondary | ICD-10-CM | POA: Diagnosis not present

## 2015-12-21 DIAGNOSIS — M8580 Other specified disorders of bone density and structure, unspecified site: Secondary | ICD-10-CM | POA: Insufficient documentation

## 2015-12-21 DIAGNOSIS — E538 Deficiency of other specified B group vitamins: Secondary | ICD-10-CM

## 2015-12-21 DIAGNOSIS — C3491 Malignant neoplasm of unspecified part of right bronchus or lung: Secondary | ICD-10-CM

## 2015-12-21 DIAGNOSIS — Z888 Allergy status to other drugs, medicaments and biological substances status: Secondary | ICD-10-CM | POA: Insufficient documentation

## 2015-12-21 DIAGNOSIS — E785 Hyperlipidemia, unspecified: Secondary | ICD-10-CM | POA: Diagnosis not present

## 2015-12-21 DIAGNOSIS — F17210 Nicotine dependence, cigarettes, uncomplicated: Secondary | ICD-10-CM | POA: Diagnosis not present

## 2015-12-21 DIAGNOSIS — C3490 Malignant neoplasm of unspecified part of unspecified bronchus or lung: Secondary | ICD-10-CM | POA: Insufficient documentation

## 2015-12-21 DIAGNOSIS — R7989 Other specified abnormal findings of blood chemistry: Secondary | ICD-10-CM

## 2015-12-21 LAB — COMPREHENSIVE METABOLIC PANEL
ALT: 15 U/L (ref 14–54)
ANION GAP: 6 (ref 5–15)
AST: 25 U/L (ref 15–41)
Albumin: 3.4 g/dL — ABNORMAL LOW (ref 3.5–5.0)
Alkaline Phosphatase: 55 U/L (ref 38–126)
BUN: 22 mg/dL — ABNORMAL HIGH (ref 6–20)
CHLORIDE: 101 mmol/L (ref 101–111)
CO2: 28 mmol/L (ref 22–32)
CREATININE: 0.94 mg/dL (ref 0.44–1.00)
Calcium: 8.8 mg/dL — ABNORMAL LOW (ref 8.9–10.3)
GFR, EST NON AFRICAN AMERICAN: 57 mL/min — AB (ref >60)
Glucose, Bld: 96 mg/dL (ref 65–99)
POTASSIUM: 4.4 mmol/L (ref 3.5–5.1)
SODIUM: 135 mmol/L (ref 135–145)
Total Bilirubin: 0.4 mg/dL (ref 0.3–1.2)
Total Protein: 6.6 g/dL (ref 6.5–8.1)

## 2015-12-21 LAB — CBC WITH DIFFERENTIAL/PLATELET
Basophils Absolute: 0 10*3/uL (ref 0.0–0.1)
Basophils Relative: 0 %
EOS ABS: 0 10*3/uL (ref 0.0–0.7)
EOS PCT: 2 %
HCT: 30.4 % — ABNORMAL LOW (ref 36.0–46.0)
Hemoglobin: 9.3 g/dL — ABNORMAL LOW (ref 12.0–15.0)
LYMPHS ABS: 0.9 10*3/uL (ref 0.7–4.0)
LYMPHS PCT: 33 %
MCH: 24.2 pg — AB (ref 26.0–34.0)
MCHC: 30.6 g/dL (ref 30.0–36.0)
MCV: 79 fL (ref 78.0–100.0)
MONO ABS: 0 10*3/uL — AB (ref 0.1–1.0)
Monocytes Relative: 1 %
Neutro Abs: 1.7 10*3/uL (ref 1.7–7.7)
Neutrophils Relative %: 64 %
PLATELETS: 143 10*3/uL — AB (ref 150–400)
RBC: 3.85 MIL/uL — ABNORMAL LOW (ref 3.87–5.11)
RDW: 15 % (ref 11.5–15.5)
WBC: 2.7 10*3/uL — ABNORMAL LOW (ref 4.0–10.5)

## 2015-12-21 MED ORDER — CYANOCOBALAMIN 1000 MCG/ML IJ SOLN
1000.0000 ug | Freq: Once | INTRAMUSCULAR | Status: AC
  Administered 2015-12-21: 1000 ug via INTRAMUSCULAR

## 2015-12-21 MED ORDER — CYANOCOBALAMIN 1000 MCG/ML IJ SOLN: 1000.0000 ug | Freq: Once | INTRAMUSCULAR | Status: DC

## 2015-12-21 MED ORDER — CYANOCOBALAMIN 1000 MCG/ML IJ SOLN
INTRAMUSCULAR | Status: AC
  Filled 2015-12-21: qty 1

## 2015-12-21 NOTE — Progress Notes (Signed)
Janet Fletcher presents today for injection per MD orders. B12 1053mg administered IM in left deltoid. Administration without incident. Patient tolerated well.

## 2015-12-21 NOTE — Addendum Note (Signed)
Addended by: Gerhard Perches on: 12/21/2015 09:33 AM   Modules accepted: Orders

## 2015-12-21 NOTE — Assessment & Plan Note (Signed)
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  Oncology history is up to date.  She has been referred to XRT for consideration of concurrent chemoradiation.  She has been evaluated by Burtis Junes, RD and his recommendations are reviewed.  For patients with LS-SCLC treated with contemporary chemoradiotherapy and prophylactic cranial irradiation, overall response rates of 80 to 90 percent, including 50 to 60 percent complete response rates, are typically reported. Median survival is around 17 months, and the five-year survival rate is about 20 percent.  MRI of brain w and wo contrast has been ordered and this will be scheduled in the near future.  At this immediate time, results of imaging test would not alter treatment.  I have explained to the patient the natural history and favoritism of SCLC has towards the cranial cavity.  Return as scheduled for follow-up, pre-treatment labs, and initiation of cycle #2 of treatment.

## 2015-12-21 NOTE — Assessment & Plan Note (Signed)
B12 deficiency with negative intrinsic factor and anti-parietal cell antibody testing on 12/15/2015.  Started B12 weekly x 4 on 12/15/2015.  This will be followed by monthly injections.  B12 due today.

## 2015-12-21 NOTE — Progress Notes (Signed)
Janet Fletcher, [place] [address]  Small cell carcinoma of right lung [facility])  Low vitamin B12 level  CURRENT THERAPY: Cisplatin/Etoposide beginning on 12/13/2015  INTERVAL HISTORY: Janet Fletcher 80 y.o. 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    Small cell carcinoma of right lung ([place])   11/28/2015 Imaging Large anterior mediastinal mass in continuity with a R hilar mass, narrowing of SVC, abnormal densities in RUL, 9 mm spiculated nodule, nonspec. hypodensity in liver   12/05/2015 PET scan Large hypermetabolic paratracheal mass c/w SCLC, perihilar nodular densities in RML, mild metabolic activity RUL nodule, no distant metastatic disease   12/11/2015 Procedure Video bronch with biopsies and brushings, endobronchial ultrasound with mediastinal LN aspiration. Dr. Roxan Hockey   12/11/2015 Pathology Results Trachea biopsy negative, FNA RUL malignant cells c/w [facility]   12/13/2015 -  Chemotherapy Cisplatin/Etoposide    TODAY IS A NADIR CHECK.  I personally reviewed and went over laboratory results with the patient.  The results are noted within this dictation.  Labs will be updated today.  Chart is reviewed.  He tolerated treatment very well. She denies any nausea or vomiting. Her appetite is improved slightly. Her weight is down significantly compared to 12/15/2015 but going further back in time her weight is very stable throughout the month of May. I suspect the 12/15/2015 weight check is inaccurate.  She continues to be active. 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  I reviewed her diagnosis and reviewed the role of MRI of brain to evaluate for metastatic disease to the brain. She is educated that the natural history and predilection for small cell lung cancer is to migrate to the brain.  She starts radiation therapy next Friday. She was simulated recently.   Review of  Systems  Constitutional: Negative.  Negative for fever, chills, weight loss and malaise/fatigue.  HENT: Negative.   Eyes: Negative.  Negative for blurred vision and double vision.  Respiratory: Negative.  Negative for cough, hemoptysis, sputum production and shortness of breath.   Cardiovascular: Negative.  Negative for chest pain.  Gastrointestinal: Negative.  Negative for nausea, vomiting, diarrhea and constipation.  Genitourinary: Negative.  Negative for dysuria, frequency and hematuria.  Musculoskeletal: Negative.   Skin: Negative.   Neurological: Negative.  Negative for dizziness, sensory change, speech change, focal weakness, seizures, loss of consciousness and headaches.  Endo/Heme/Allergies: Negative.   Psychiatric/Behavioral: Negative.     Past Medical History  Diagnosis Date  . Hyperlipidemia   . Allergy   . Osteopenia   . Low vitamin B12 level 12/15/2015  . Iron deficiency anemia 12/15/2015    Past Surgical History  Procedure Laterality Date  . Small intestine surgery    . Cataract extraction, bilateral Bilateral   . Minor excision ear canal cyst Left     30+ years ago  . Video bronchoscopy with endobronchial ultrasound N/A 12/11/2015    Procedure: VIDEO BRONCHOSCOPY WITH ENDOBRONCHIAL ULTRASOUND;  Surgeon: Melrose Nakayama, MD;  Location: [facility] OR;  Service: Thoracic;  Laterality: N/A;    Family History  Problem Relation Age of Onset  . Cancer Mother   . Hypertension Father     Social History   Social History  . Marital Status: Married    Spouse Name: N/A  . Number of Children: N/A  . Years of Education: N/A   Social History Main Topics  . Smoking status: Current  Some Day Smoker -- 0.25 packs/day for 50 years  . Smokeless tobacco: Never Used  . Alcohol Use: No  . Drug Use: No  . Sexual Activity: Not Asked   Other Topics Concern  . None   Social History Narrative     PHYSICAL EXAMINATION  ECOG PERFORMANCE STATUS: 0 - Asymptomatic  Filed  Vitals:   12/21/15 0901  BP: 113/60  Pulse: 63  Temp: 97.7 F (36.5 C)  Resp: 20    GENERAL:alert, no distress, well nourished, well developed, comfortable, cooperative, smiling and accompanied by her 2 daughters SKIN: skin color, texture, turgor are normal, no rashes or significant lesions HEAD: Normocephalic, No masses, lesions, tenderness or abnormalities EYES: normal, EOMI, Conjunctiva are pink and non-injected EARS: External ears normal OROPHARYNX:lips, buccal mucosa, and tongue normal and mucous membranes are moist  NECK: supple, thyroid normal size, non-tender, without nodularity, trachea midline LYMPH:  no palpable lymphadenopathy BREAST:not examined LUNGS: clear to auscultation and percussion HEART: regular rate & rhythm, no murmurs, no gallops, S1 normal and S2 normal ABDOMEN:abdomen soft, non-tender and normal bowel sounds BACK: Back symmetric, no curvature. EXTREMITIES:less then 2 second capillary refill, no joint deformities, effusion, or inflammation, no skin discoloration, no cyanosis  NEURO: alert & oriented x 3 with fluent speech, no focal motor/sensory deficits, gait normal   LABORATORY DATA: CBC    Component Value Date/Time   WBC 2.7* 12/21/2015 0833   WBC 5.5 08/20/2013 1559   RBC 3.85* 12/21/2015 0833   RBC 4.6 08/20/2013 1559   HGB 9.3* 12/21/2015 0833   HGB 10.1* 08/20/2013 1559   HCT 30.4* 12/21/2015 0833   HCT 33.1* 08/20/2013 1559   PLT 143* 12/21/2015 0833   MCV 79.0 12/21/2015 0833   MCV 71.9* 08/20/2013 1559   MCH 24.2* 12/21/2015 0833   MCH 21.9* 08/20/2013 1559   MCHC 30.6 12/21/2015 0833   MCHC 30.5* 08/20/2013 1559   RDW 15.0 12/21/2015 0833   LYMPHSABS 0.9 12/21/2015 0833   MONOABS 0.0* 12/21/2015 0833   EOSABS 0.0 12/21/2015 0833   BASOSABS 0.0 12/21/2015 0833      Chemistry      Component Value Date/Time   NA 135 12/21/2015 0833   NA 143 09/29/2014 1203   K 4.4 12/21/2015 0833   CL 101 12/21/2015 0833   CO2 28 12/21/2015  0833   BUN 22* 12/21/2015 0833   BUN 12 09/29/2014 1203   CREATININE 0.94 12/21/2015 0833   CREATININE 0.76 01/06/2013 1428      Component Value Date/Time   CALCIUM 8.8* 12/21/2015 0833   ALKPHOS 55 12/21/2015 0833   AST 25 12/21/2015 0833   ALT 15 12/21/2015 0833   BILITOT 0.4 12/21/2015 0833   BILITOT 0.4 09/29/2014 1203        PENDING LABS:   RADIOGRAPHIC STUDIES:  Dg Chest 2 View  12/11/2015  CLINICAL DATA:  Preop for mediastinal mass EXAM: CHEST  2 VIEW COMPARISON:  12/05/2015 and 11/27/2015 FINDINGS: Cardiomediastinal silhouette is stable. Right paratracheal soft tissue prominence again noted. Atherosclerotic calcifications of thoracic aorta. No infiltrate or pulmonary edema. Hyperinflation and mild emphysematous changes. IMPRESSION: Right paratracheal soft tissue prominence again noted. No infiltrate or pulmonary edema. Hyperinflation and mild emphysematous changes. Electronically Signed   By: Lahoma Crocker M.D.   On: 12/11/2015 08:18   Ct Chest W Contrast  11/28/2015  CLINICAL DATA:  Abnormal chest x-ray EXAM: CT CHEST WITH CONTRAST TECHNIQUE: Multidetector CT imaging of the chest was performed during intravenous contrast administration. CONTRAST:  44m ISOVUE-300 IOPAMIDOL (ISOVUE-300) INJECTION 61% COMPARISON:  None. FINDINGS: 3 mm right thyroid hypodensity. Large middle mediastinal mass is anterior to the trachea extending into the AP window into the right paratracheal regions. This causes marked narrowing of the SVC. 2.9 x 2.0 cm right hilar mass. This is probably in direct continuity with the larger middle mediastinal mass. This can be appreciated on coronal reformats. See image 53 of series 4. There is a complex branching mass in the anterior inferior right upper lobe on image 75 measuring 2.4 x 3.6 cm. An anterior segmental bronchus is occluded at the level of this mass. See image 73 of series 3. There is also a small spiculated anterior right upper lobe mass on image 58  measuring 9 mm. Vague focal ground-glass opacity in the right upper lobe on image 65 measures approximately 10 mm. Dependent atelectasis at the posterior right lung base. No left lung masses. Significant emphysema throughout the lungs with a prep collection for the lung apices is present. No pneumothorax or pleural effusion. Images of the upper abdomen demonstrate a 7 mm hypodensity in the lateral segment of the left lobe of the liver anteriorly. There is also prominent calcified plaque in the juxtarenal aorta. IMPRESSION: Large anterior mediastinal mass in continuity with a right hilar mass worrisome for malignancy. This does cause narrowing of the SVC. There are abnormal parenchymal densities in the right upper lobe. There is a 9 mm spiculated nodule, large complex of branching 3.6 cm mass, and a vague 10 mm ground-glass area. Again, malignancy is suspected. Nonspecific hypodensity in the liver. Tiny hypodensity in the right lobe of the thyroid gland. Ultrasound can be performed. Electronically Signed   By: AMarybelle KillingsM.D.   On: 11/28/2015 10:58   Nm Pet Image Initial (pi) Skull Base To Thigh  12/05/2015  CLINICAL DATA:  Initial treatment strategy for mediastinal mass. EXAM: NUCLEAR MEDICINE PET SKULL BASE TO THIGH TECHNIQUE: Thirteen mCi F-18 FDG was injected intravenously. Full-ring PET imaging was performed from the skull base to thigh after the radiotracer. CT data was obtained and used for attenuation correction and anatomic localization. FASTING BLOOD GLUCOSE:  Value: 83 mg/dl COMPARISON:  CT 11/28/2015 FINDINGS: NECK Hypermetabolic nodules in the central RIGHT upper lobe measuring approximately 1 cm each (image 99, series 3) with intense metabolic activity (SUV max 11.9). Small nodule in the RIGHT upper lobe medially measuring 10 mm on image 90, series 3 has mild metabolic activity. The large RIGHT paratracheal mass measures 7 cm with intense peripheral metabolic activity with SUV max equal 12.9. This  metabolic activity extends to the high RIGHT paratracheal location. Mass surrounds the SVC. No LEFT lung hypermetabolic nodules. CHEST No hypermetabolic mediastinal or hilar nodes. No suspicious pulmonary nodules on the CT scan. ABDOMEN/PELVIS No abnormal hypermetabolic activity within the liver, pancreas, adrenal glands, or spleen. No hypermetabolic lymph nodes in the abdomen or pelvis. There is dense sclerosis of the aorta which extends into the lumen. Uterus normal. SKELETON No focal hypermetabolic activity to suggest skeletal metastasis. IMPRESSION: 1. Large intensely hypermetabolic paratracheal mass most consistent with metastatic small cell lung carcinoma. 2. Perihilar nodular densities in the RIGHT middle lobe are likely pulmonary metastasis. 3. Mild metabolic activity associated are RIGHT upper lobe nodule may represent primary lesion. 4. No evidence of distant metastatic disease. 5. Dense sclerosis of the aorta is extends into the lumen of the infrarenal abdominal aorta LEFT common iliac artery. Electronically Signed   By: SHelane GuntherD.  On: 12/05/2015 17:19   Ir Fluoro Guide Cv Line Right  12/12/2015  CLINICAL DATA:  80 year old female with a history of small cell carcinoma. EXAM: IR RIGHT FLOURO GUIDE CV LINE; IR ULTRASOUND GUIDANCE VASC ACCESS RIGHT Date: 12/12/2015 ANESTHESIA/SEDATION: Moderate (conscious) sedation was administered during this procedure. A total of 1.0 mg Versed and 50 mg Fentanyl were administered intravenously. The patient's vital signs were monitored continuously by radiology nursing throughout the course of the procedure. Total sedation time: 19 minutes FLUOROSCOPY TIME:  Zero minutes 6 second TECHNIQUE: The procedure, risks, benefits, and alternatives were explained to the patient. Questions regarding the procedure were encouraged and answered. The patient understands and consents to the procedure. Ultrasound survey was performed with images stored and sent to PACs. The  right neck and chest was prepped with chlorhexidine, and draped in the usual sterile fashion using maximum barrier technique (cap and mask, sterile gown, sterile gloves, large sterile sheet, hand hygiene and cutaneous antiseptic). Antibiotic prophylaxis was provided with 1.0g vancomycin administered IV one hour prior to skin incision. Local anesthesia was attained by infiltration with 1% lidocaine without epinephrine. Ultrasound demonstrated patency of the right internal jugular vein, and this was documented with an image. Under real-time ultrasound guidance, this vein was accessed with a 21 gauge micropuncture needle and image documentation was performed. A small dermatotomy was made at the access site with an 11 scalpel. A 0.018" wire was advanced into the SVC and used to estimate the length of the internal catheter. The access needle exchanged for a 73F micropuncture vascular sheath. The 0.018" wire was then removed and a 0.035" wire advanced into the IVC. An appropriate location for the subcutaneous reservoir was selected below the clavicle and an incision was made through the skin and underlying soft tissues. The subcutaneous tissues were then dissected using a combination of blunt and sharp surgical technique and a pocket was formed. A single lumen power injectable portacatheter was then tunneled through the subcutaneous tissues from the pocket to the dermatotomy and the port reservoir placed within the subcutaneous pocket. The venous access site was then serially dilated and a peel away vascular sheath placed over the wire. The wire was removed and the port catheter advanced into position under fluoroscopic guidance. The catheter tip is positioned in the cavoatrial junction. This was documented with a spot image. The portacatheter was then tested and found to flush and aspirate well. The port was flushed with saline followed by 100 units/mL heparinized saline. The pocket was then closed in two layers using  first subdermal inverted interrupted absorbable sutures followed by a running subcuticular suture. The epidermis was then sealed with Dermabond. The dermatotomy at the venous access site was also seal with Dermabond. Patient tolerated the procedure well and remained hemodynamically stable throughout. No complications encountered and no significant blood loss encountered COMPLICATIONS: None.  The patient tolerated the procedure well. IMPRESSION: Status post right IJ port catheter placement. Catheter ready for use. Signed, Dulcy Fanny. Earleen Newport, DO [facility] Electronically Signed   By: Corrie Mckusick D.O.   On: 12/12/2015 17:44   Ir US Guide Vasc Access Right  12/12/2015  CLINICAL DATA:  80 year old female with a history of small cell carcinoma. EXAM: IR RIGHT FLOURO GUIDE CV LINE; IR ULTRASOUND GUIDANCE VASC ACCESS RIGHT Date: 12/12/2015 ANESTHESIA/SEDATION: Moderate (conscious) sedation was administered during this procedure. A total of 1.0 mg Versed and 50 mg Fentanyl were administered intravenously. The patient's vital signs were monitored continuously  by radiology nursing throughout the course of the procedure. Total sedation time: 19 minutes FLUOROSCOPY TIME:  Zero minutes 6 second TECHNIQUE: The procedure, risks, benefits, and alternatives were explained to the patient. Questions regarding the procedure were encouraged and answered. The patient understands and consents to the procedure. Ultrasound survey was performed with images stored and sent to PACs. The right neck and chest was prepped with chlorhexidine, and draped in the usual sterile fashion using maximum barrier technique (cap and mask, sterile gown, sterile gloves, large sterile sheet, hand hygiene and cutaneous antiseptic). Antibiotic prophylaxis was provided with 1.0g vancomycin administered IV one hour prior to skin incision. Local anesthesia was attained by infiltration with 1% lidocaine  without epinephrine. Ultrasound demonstrated patency of the right internal jugular vein, and this was documented with an image. Under real-time ultrasound guidance, this vein was accessed with a 21 gauge micropuncture needle and image documentation was performed. A small dermatotomy was made at the access site with an 11 scalpel. A 0.018" wire was advanced into the SVC and used to estimate the length of the internal catheter. The access needle exchanged for a 32F micropuncture vascular sheath. The 0.018" wire was then removed and a 0.035" wire advanced into the IVC. An appropriate location for the subcutaneous reservoir was selected below the clavicle and an incision was made through the skin and underlying soft tissues. The subcutaneous tissues were then dissected using a combination of blunt and sharp surgical technique and a pocket was formed. A single lumen power injectable portacatheter was then tunneled through the subcutaneous tissues from the pocket to the dermatotomy and the port reservoir placed within the subcutaneous pocket. The venous access site was then serially dilated and a peel away vascular sheath placed over the wire. The wire was removed and the port catheter advanced into position under fluoroscopic guidance. The catheter tip is positioned in the cavoatrial junction. This was documented with a spot image. The portacatheter was then tested and found to flush and aspirate well. The port was flushed with saline followed by 100 units/mL heparinized saline. The pocket was then closed in two layers using first subdermal inverted interrupted absorbable sutures followed by a running subcuticular suture. The epidermis was then sealed with Dermabond. The dermatotomy at the venous access site was also seal with Dermabond. Patient tolerated the procedure well and remained hemodynamically stable throughout. No complications encountered and no significant blood loss encountered COMPLICATIONS: None.  The  patient tolerated the procedure well. IMPRESSION: Status post right IJ port catheter placement. Catheter ready for use. Signed, Dulcy Fanny. Earleen Newport, DO [facility] Electronically Signed   By: Corrie Mckusick D.O.   On: 12/12/2015 17:44   Dg Chest Port 1 View  12/11/2015  CLINICAL DATA:  Laryngospasm EXAM: PORTABLE CHEST 1 VIEW COMPARISON:  12/11/2015, 12/05/2015 FINDINGS: Right peritracheal fullness compatible with known hypermetabolic mass concerning for malignancy. Stable mild cardiac enlargement. Vascular pattern normal. Lungs clear. Bilateral nipple shadows noted. IMPRESSION: Stable soft tissue fullness right paratracheal region. Electronically Signed   By: Skipper Cliche M.D.   On: 12/11/2015 12:43     PATHOLOGY:    ASSESSMENT AND PLAN:  Small cell carcinoma of right lung (HCC) Limited stage small cell lung cancer (right) began systemic chemotherapy on 12/13/2015 with curative intent Cisplatin/Etoposide.  Oncology history is up to date.  She has been referred to XRT for consideration of concurrent chemoradiation.  She has been evaluated by Burtis Junes, RD and his recommendations are  reviewed.  For patients with LS-SCLC treated with contemporary chemoradiotherapy and prophylactic cranial irradiation, overall response rates of 80 to 90 percent, including 50 to 60 percent complete response rates, are typically reported. Median survival is around 17 months, and the five-year survival rate is about 20 percent.  MRI of brain w and wo contrast has been ordered and this will be scheduled in the near future.  At this immediate time, results of imaging test would not alter treatment.  I have explained to the patient the natural history and favoritism of SCLC has towards the cranial cavity.  Return as scheduled for follow-up, pre-treatment labs, and initiation of cycle #2 of treatment.   Low vitamin B12 level B12 deficiency with negative  intrinsic factor and anti-parietal cell antibody testing on 12/15/2015.  Started B12 weekly x 4 on 12/15/2015.  This will be followed by monthly injections.  B12 due today.    ORDERS PLACED FOR THIS ENCOUNTER: No orders of the defined types were placed in this encounter.    MEDICATIONS PRESCRIBED THIS ENCOUNTER: No orders of the defined types were placed in this encounter.    THERAPY PLAN:  Continue treatment as planned with curative intent.  All questions were answered. The patient knows to call the clinic with any problems, questions or concerns. We can certainly see the patient much sooner if necessary.  Patient and plan discussed with Dr. Ancil Linsey and she is in agreement with the aforementioned.   This note is electronically signed by: Doy Mince 12/21/2015 9:28 AM

## 2015-12-21 NOTE — Patient Instructions (Signed)
[place] at [facility] Discharge Instructions  RECOMMENDATIONS MADE BY THE CONSULTANT AND ANY TEST RESULTS WILL BE SENT TO YOUR REFERRING PHYSICIAN.   MRI in next 2 weeks  Vitamin b12 injection given today  Call for any problems [phone]  You look great!  Keep drinking your Ensure!  Return as scheduled for chemo/MD appts    Thank you for choosing [place] at [facility] to provide your oncology and hematology care.  To afford each patient quality time with our provider, please arrive at least 15 minutes before your scheduled appointment time.   Beginning January 23rd 2017 lab work for the [facility] will be done in the  Main lab at [facility] on 1st floor. If you have a lab appointment with the [place] please come in thru the  Main Entrance and check in at the main information desk  You need to re-schedule your appointment should you arrive 10 or more minutes late.  We strive to give you quality time with our providers, and arriving late affects you and other patients whose appointments are after yours.  Also, if you no show three or more times for appointments you may be dismissed from the clinic at the providers discretion.     Again, thank you for choosing [facility].  Our hope is that these requests will decrease the amount of time that you wait before being seen by our physicians.       _____________________________________________________________  Should you have questions after your visit to [facility], please contact our office at [phone] between the hours of 8:30 a.m. and 4:30 p.m.  Voicemails left after 4:30 p.m. will not be returned until the following business day.  For prescription refill requests, have your pharmacy contact our office.         Resources For Cancer Patients and their Caregivers ? American Cancer Society: Can assist with transportation, wigs, general  needs, runs Look Good Feel Better.        413-672-3084 ? Cancer Care: Provides financial assistance, online support groups, medication/co-pay assistance.  1-800-813-HOPE (269)859-6811) ? [place] Assists [place] cancer patients and their families through emotional , educational and financial support.  503-473-5161 ? [place] DSS Where to apply for food stamps, Medicaid and utility assistance. 6282259402 ? [facility]: Transportation to medical appointments. (580)089-6434 ? Social Security Administration: May apply for disability if have a Stage IV cancer. (336) 189-3139 4138236686 ? [employer], Disability and Transit Services: Assists with nutrition, care and transit needs. [place] Support Programs: '@10RELATIVEDAYS'$ @ > Cancer Support Group  2nd Tuesday of the month 1pm-2pm, Journey Room  > Creative Journey  3rd Tuesday of the month 1130am-1pm, Journey Room  > Look Good Feel Better  1st Wednesday of the month 10am-12 noon, Journey Room (Call [place] to register [phone])

## 2015-12-22 ENCOUNTER — Ambulatory Visit (HOSPITAL_COMMUNITY): Payer: Commercial Managed Care - HMO

## 2015-12-23 ENCOUNTER — Other Ambulatory Visit (HOSPITAL_COMMUNITY): Payer: Self-pay | Admitting: *Deleted

## 2015-12-25 ENCOUNTER — Ambulatory Visit (HOSPITAL_COMMUNITY)
Admission: RE | Admit: 2015-12-25 | Discharge: 2015-12-25 | Disposition: A | Discharge status: Home / Self Care | Payer: Commercial Managed Care - HMO | Source: Ambulatory Visit | Attending: Hematology & Oncology | Admitting: Hematology & Oncology

## 2015-12-25 DIAGNOSIS — G939 Disorder of brain, unspecified: Secondary | ICD-10-CM | POA: Diagnosis not present

## 2015-12-25 DIAGNOSIS — C3490 Malignant neoplasm of unspecified part of unspecified bronchus or lung: Secondary | ICD-10-CM | POA: Diagnosis not present

## 2015-12-25 DIAGNOSIS — R918 Other nonspecific abnormal finding of lung field: Secondary | ICD-10-CM | POA: Diagnosis not present

## 2015-12-25 DIAGNOSIS — G9389 Other specified disorders of brain: Secondary | ICD-10-CM | POA: Diagnosis not present

## 2015-12-25 MED ORDER — GADOBENATE DIMEGLUMINE 529 MG/ML IV SOLN
10.0000 mL | Freq: Once | INTRAVENOUS | Status: AC | PRN
  Administered 2015-12-25: 10 mL via INTRAVENOUS

## 2015-12-27 DIAGNOSIS — C3411 Malignant neoplasm of upper lobe, right bronchus or lung: Secondary | ICD-10-CM | POA: Diagnosis not present

## 2015-12-27 DIAGNOSIS — Z510 Encounter for antineoplastic radiation therapy: Secondary | ICD-10-CM | POA: Diagnosis not present

## 2015-12-28 DIAGNOSIS — Z510 Encounter for antineoplastic radiation therapy: Secondary | ICD-10-CM | POA: Diagnosis not present

## 2015-12-28 DIAGNOSIS — C3411 Malignant neoplasm of upper lobe, right bronchus or lung: Secondary | ICD-10-CM | POA: Diagnosis not present

## 2015-12-29 ENCOUNTER — Encounter (HOSPITAL_BASED_OUTPATIENT_CLINIC_OR_DEPARTMENT_OTHER): Payer: Commercial Managed Care - HMO

## 2015-12-29 VITALS — BP 138/60 | HR 62 | Temp 98.0°F | Resp 18

## 2015-12-29 DIAGNOSIS — E538 Deficiency of other specified B group vitamins: Secondary | ICD-10-CM

## 2015-12-29 DIAGNOSIS — Z510 Encounter for antineoplastic radiation therapy: Secondary | ICD-10-CM | POA: Diagnosis not present

## 2015-12-29 DIAGNOSIS — C3411 Malignant neoplasm of upper lobe, right bronchus or lung: Secondary | ICD-10-CM | POA: Diagnosis not present

## 2015-12-29 MED ORDER — CYANOCOBALAMIN 1000 MCG/ML IJ SOLN
INTRAMUSCULAR | Status: AC
  Filled 2015-12-29: qty 1

## 2015-12-29 MED ORDER — CYANOCOBALAMIN 1000 MCG/ML IJ SOLN
1000.0000 ug | Freq: Once | INTRAMUSCULAR | Status: AC
  Administered 2015-12-29: 1000 ug via INTRAMUSCULAR

## 2015-12-29 NOTE — Patient Instructions (Signed)
[facility] at [facility] Discharge Instructions  RECOMMENDATIONS MADE BY THE CONSULTANT AND ANY TEST RESULTS WILL BE SENT TO YOUR REFERRING PHYSICIAN.  B12 injection today.    Thank you for choosing [facility] at [facility] to provide your oncology and hematology care.  To afford each patient quality time with our provider, please arrive at least 15 minutes before your scheduled appointment time.   Beginning January 23rd 2017 lab work for the Cancer Center will be done in the  Main lab at [place] on 1st floor. If you have a lab appointment with the Cancer Center please come in thru the  Main Entrance and check in at the main information desk  You need to re-schedule your appointment should you arrive 10 or more minutes late.  We strive to give you quality time with our providers, and arriving late affects you and other patients whose appointments are after yours.  Also, if you no show three or more times for appointments you may be dismissed from the clinic at the providers discretion.     Again, thank you for choosing [facility].  Our hope is that these requests will decrease the amount of time that you wait before being seen by our physicians.       _____________________________________________________________  Should you have questions after your visit to [facility], please contact our office at [phone] between the hours of 8:30 a.m. and 4:30 p.m.  Voicemails left after 4:30 p.m. will not be returned until the following business day.  For prescription refill requests, have your pharmacy contact our office.         Resources For Cancer Patients and their Caregivers ? American Cancer Society: Can assist with transportation, wigs, general needs, runs Look Good Feel Better.        1-888-227-6333 ? Cancer Care: Provides financial assistance, online support groups, medication/co-pay assistance.   1-800-813-HOPE (4673) ? [facility] Assists [place] cancer patients and their families through emotional , educational and financial support.  336-427-4357 ? [place] DSS Where to apply for food stamps, Medicaid and utility assistance. 336-342-1394 ? RCATS: Transportation to medical appointments. 336-347-2287 ? Social Security Administration: May apply for disability if have a Stage IV cancer. 336-342-7796 1-800-772-1213 ? [place] Aging, Disability and Transit Services: Assists with nutrition, care and transit needs. 336-349-2343  Cancer Center Support Programs: @10RELATIVEDAYS@ > Cancer Support Group  2nd Tuesday of the month 1pm-2pm, Journey Room  > Creative Journey  3rd Tuesday of the month 1130am-1pm, Journey Room  > Look Good Feel Better  1st Wednesday of the month 10am-12 noon, Journey Room (Call American Cancer Society to register 1-800-395-5775)    

## 2015-12-29 NOTE — Progress Notes (Signed)
Janet Fletcher presents today for injection per MD orders. B12 1050mg administered IM in left Upper Arm. Administration without incident. Patient tolerated well.

## 2016-01-01 DIAGNOSIS — C3411 Malignant neoplasm of upper lobe, right bronchus or lung: Secondary | ICD-10-CM | POA: Diagnosis not present

## 2016-01-01 DIAGNOSIS — Z510 Encounter for antineoplastic radiation therapy: Secondary | ICD-10-CM | POA: Diagnosis not present

## 2016-01-02 ENCOUNTER — Encounter (HOSPITAL_BASED_OUTPATIENT_CLINIC_OR_DEPARTMENT_OTHER): Payer: Commercial Managed Care - HMO | Admitting: Hematology & Oncology

## 2016-01-02 ENCOUNTER — Encounter (HOSPITAL_COMMUNITY): Payer: Self-pay | Admitting: Hematology & Oncology

## 2016-01-02 ENCOUNTER — Encounter (HOSPITAL_BASED_OUTPATIENT_CLINIC_OR_DEPARTMENT_OTHER): Payer: Commercial Managed Care - HMO

## 2016-01-02 VITALS — BP 123/67 | HR 69 | Temp 98.0°F | Resp 16 | Wt 119.0 lb

## 2016-01-02 VITALS — BP 139/64 | HR 72 | Temp 97.8°F | Resp 18

## 2016-01-02 DIAGNOSIS — E611 Iron deficiency: Secondary | ICD-10-CM | POA: Diagnosis not present

## 2016-01-02 DIAGNOSIS — C3411 Malignant neoplasm of upper lobe, right bronchus or lung: Secondary | ICD-10-CM | POA: Diagnosis not present

## 2016-01-02 DIAGNOSIS — Z5111 Encounter for antineoplastic chemotherapy: Secondary | ICD-10-CM | POA: Diagnosis not present

## 2016-01-02 DIAGNOSIS — F17210 Nicotine dependence, cigarettes, uncomplicated: Secondary | ICD-10-CM | POA: Diagnosis not present

## 2016-01-02 DIAGNOSIS — C3490 Malignant neoplasm of unspecified part of unspecified bronchus or lung: Secondary | ICD-10-CM | POA: Diagnosis not present

## 2016-01-02 DIAGNOSIS — D649 Anemia, unspecified: Secondary | ICD-10-CM

## 2016-01-02 DIAGNOSIS — M8580 Other specified disorders of bone density and structure, unspecified site: Secondary | ICD-10-CM | POA: Diagnosis not present

## 2016-01-02 DIAGNOSIS — C3491 Malignant neoplasm of unspecified part of right bronchus or lung: Secondary | ICD-10-CM | POA: Diagnosis not present

## 2016-01-02 DIAGNOSIS — Z9889 Other specified postprocedural states: Secondary | ICD-10-CM | POA: Diagnosis not present

## 2016-01-02 DIAGNOSIS — E538 Deficiency of other specified B group vitamins: Secondary | ICD-10-CM | POA: Diagnosis not present

## 2016-01-02 DIAGNOSIS — E785 Hyperlipidemia, unspecified: Secondary | ICD-10-CM | POA: Diagnosis not present

## 2016-01-02 DIAGNOSIS — Z888 Allergy status to other drugs, medicaments and biological substances status: Secondary | ICD-10-CM | POA: Diagnosis not present

## 2016-01-02 DIAGNOSIS — Z720 Tobacco use: Secondary | ICD-10-CM

## 2016-01-02 DIAGNOSIS — D509 Iron deficiency anemia, unspecified: Secondary | ICD-10-CM

## 2016-01-02 DIAGNOSIS — Z510 Encounter for antineoplastic radiation therapy: Secondary | ICD-10-CM | POA: Diagnosis not present

## 2016-01-02 LAB — COMPREHENSIVE METABOLIC PANEL
ALT: 14 U/L (ref 14–54)
ANION GAP: 7 (ref 5–15)
AST: 22 U/L (ref 15–41)
Albumin: 3.2 g/dL — ABNORMAL LOW (ref 3.5–5.0)
Alkaline Phosphatase: 57 U/L (ref 38–126)
BUN: 21 mg/dL — ABNORMAL HIGH (ref 6–20)
CHLORIDE: 104 mmol/L (ref 101–111)
CO2: 28 mmol/L (ref 22–32)
CREATININE: 1.08 mg/dL — AB (ref 0.44–1.00)
Calcium: 8.5 mg/dL — ABNORMAL LOW (ref 8.9–10.3)
GFR, EST AFRICAN AMERICAN: 56 mL/min — AB (ref >60)
GFR, EST NON AFRICAN AMERICAN: 48 mL/min — AB (ref >60)
Glucose, Bld: 82 mg/dL (ref 65–99)
POTASSIUM: 4 mmol/L (ref 3.5–5.1)
Sodium: 139 mmol/L (ref 135–145)
Total Bilirubin: 0.3 mg/dL (ref 0.3–1.2)
Total Protein: 6.2 g/dL — ABNORMAL LOW (ref 6.5–8.1)

## 2016-01-02 LAB — CBC WITH DIFFERENTIAL/PLATELET
Basophils Absolute: 0 10*3/uL (ref 0.0–0.1)
Basophils Relative: 0 %
EOS ABS: 0 10*3/uL (ref 0.0–0.7)
EOS PCT: 0 %
HCT: 29.7 % — ABNORMAL LOW (ref 36.0–46.0)
Hemoglobin: 9.1 g/dL — ABNORMAL LOW (ref 12.0–15.0)
LYMPHS PCT: 27 %
Lymphs Abs: 1 10*3/uL (ref 0.7–4.0)
MCH: 24.5 pg — AB (ref 26.0–34.0)
MCHC: 30.6 g/dL (ref 30.0–36.0)
MCV: 80.1 fL (ref 78.0–100.0)
MONO ABS: 0.5 10*3/uL (ref 0.1–1.0)
Monocytes Relative: 14 %
Neutro Abs: 2.2 10*3/uL (ref 1.7–7.7)
Neutrophils Relative %: 59 %
PLATELETS: 427 10*3/uL — AB (ref 150–400)
RBC: 3.71 MIL/uL — ABNORMAL LOW (ref 3.87–5.11)
RDW: 15.6 % — AB (ref 11.5–15.5)
WBC: 3.8 10*3/uL — AB (ref 4.0–10.5)

## 2016-01-02 LAB — MAGNESIUM: MAGNESIUM: 2 mg/dL (ref 1.7–2.4)

## 2016-01-02 MED ORDER — PALONOSETRON HCL INJECTION 0.25 MG/5ML
INTRAVENOUS | Status: AC
  Filled 2016-01-02: qty 5

## 2016-01-02 MED ORDER — SODIUM CHLORIDE 0.9 % IV SOLN
Freq: Once | INTRAVENOUS | Status: AC
  Administered 2016-01-02: 12:00:00 via INTRAVENOUS
  Filled 2016-01-02: qty 5

## 2016-01-02 MED ORDER — HEPARIN SOD (PORK) LOCK FLUSH 100 UNIT/ML IV SOLN
500.0000 [IU] | Freq: Once | INTRAVENOUS | Status: AC | PRN
  Administered 2016-01-02: 500 [IU]

## 2016-01-02 MED ORDER — HEPARIN SOD (PORK) LOCK FLUSH 100 UNIT/ML IV SOLN
INTRAVENOUS | Status: AC
  Filled 2016-01-02: qty 5

## 2016-01-02 MED ORDER — SODIUM CHLORIDE 0.9% FLUSH: 10.0000 mL | INTRAVENOUS | Status: DC | PRN

## 2016-01-02 MED ORDER — SODIUM CHLORIDE 0.9 % IV SOLN
100.0000 mg/m2 | Freq: Once | INTRAVENOUS | Status: AC
  Administered 2016-01-02: 160 mg via INTRAVENOUS
  Filled 2016-01-02: qty 8

## 2016-01-02 MED ORDER — POTASSIUM CHLORIDE 2 MEQ/ML IV SOLN
Freq: Once | INTRAVENOUS | Status: AC
  Administered 2016-01-02: 10:00:00 via INTRAVENOUS
  Filled 2016-01-02: qty 10

## 2016-01-02 MED ORDER — PALONOSETRON HCL INJECTION 0.25 MG/5ML
0.2500 mg | Freq: Once | INTRAVENOUS | Status: AC
  Administered 2016-01-02: 0.25 mg via INTRAVENOUS

## 2016-01-02 MED ORDER — SODIUM CHLORIDE 0.9 % IV SOLN
80.0000 mg/m2 | Freq: Once | INTRAVENOUS | Status: AC
  Administered 2016-01-02: 125 mg via INTRAVENOUS
  Filled 2016-01-02: qty 50

## 2016-01-02 MED ORDER — SODIUM CHLORIDE 0.9 % IV SOLN
Freq: Once | INTRAVENOUS | Status: AC
  Administered 2016-01-02: 11:00:00 via INTRAVENOUS

## 2016-01-02 NOTE — Patient Instructions (Signed)
[facility] Discharge Instructions for Patients Receiving Chemotherapy   Beginning January 23rd 2017 lab work for the [facility] will be done in the  Main lab at [facility] on 1st floor. If you have a lab appointment with the [facility] please come in thru the  Main Entrance and check in at the main information desk   Today you received the following chemotherapy agents cisplatin and [id number] Follow up as scheduled Please call the clinic if you have any questions or concerns   To help prevent nausea and vomiting after your treatment, we encourage you to take your nausea medication    If you develop nausea and vomiting, or diarrhea that is not controlled by your medication, call the clinic.  The clinic phone number is [phone]. Office hours are Monday-Friday 8:30am-5:00pm.  BELOW ARE SYMPTOMS THAT SHOULD BE REPORTED IMMEDIATELY:  *FEVER GREATER THAN 101.0 F  *CHILLS WITH OR WITHOUT FEVER  NAUSEA AND VOMITING THAT IS NOT CONTROLLED WITH YOUR NAUSEA MEDICATION  *UNUSUAL SHORTNESS OF BREATH  *UNUSUAL BRUISING OR BLEEDING  TENDERNESS IN MOUTH AND THROAT WITH OR WITHOUT PRESENCE OF ULCERS  *URINARY PROBLEMS  *BOWEL PROBLEMS  UNUSUAL RASH Items with * indicate a potential emergency and should be followed up as soon as possible. If you have an emergency after office hours please contact your primary care physician or go to the nearest emergency department.  Please call the clinic during office hours if you have any questions or concerns.   You may also contact the Patient Navigator at [phone] should you have any questions or need assistance in obtaining follow up care.      Resources For Cancer Patients and their Caregivers ? American Cancer Society: Can assist with transportation, wigs, general needs, runs Look Good Feel Better.        (614) 699-5253 ? Cancer Care: Provides financial assistance, online support groups, medication/co-pay  assistance.  1-800-813-HOPE 782-556-2930) ? [facility] Assists [place] cancer patients and their families through emotional , educational and financial support.  (207)369-8262 ? [place] DSS Where to apply for food stamps, Medicaid and utility assistance. (803) 465-2994 ? RCATS: Transportation to medical appointments. (859)428-9976 ? Social Security Administration: May apply for disability if have a Stage IV cancer. [phone] (437)734-0736 ? [employer], Disability and Transit Services: Assists with nutrition, care and transit needs. (315)[phone]

## 2016-01-02 NOTE — Progress Notes (Signed)
[facility]  Patient Care Team: Chevis Pretty, FNP as PCP - General (Nurse Practitioner)  CHIEF COMPLAINTS/PURPOSE OF CONSULTATION:  Limited stage small cell lung cancer    Small cell carcinoma of right lung ([place])   11/28/2015 Imaging Large anterior mediastinal mass in continuity with a R hilar mass, narrowing of SVC, abnormal densities in RUL, 9 mm spiculated nodule, nonspec. hypodensity in liver   12/05/2015 PET scan Large hypermetabolic paratracheal mass c/w SCLC, perihilar nodular densities in RML, mild metabolic activity RUL nodule, no distant metastatic disease   12/11/2015 Procedure Video bronch with biopsies and brushings, endobronchial ultrasound with mediastinal LN aspiration. Dr. Roxan Hockey   12/11/2015 Pathology Results Trachea biopsy negative, FNA RUL malignant cells c/w [facility]   12/13/2015 -  Chemotherapy Cisplatin/Etoposide     HISTORY OF PRESENTING ILLNESS:  Janet Fletcher 80 y.o. female is here for cycle #2 of cisplatin/etoposide for limited stage SCLC. Janet Fletcher is accompanied by her two daughters today. Presents in treatment bed.   Janet Fletcher says that her breathing is good, and that her cough is a lot better. She notes that she's eating better; "some days I eat more than I do others." She says she's doing fine letting everyone help her out, adding that "they spoil me." Her daughter notes that the radiation treatments started this past Friday, and will last into the end of July. She has had 3 treatments so far.  Her daughter also notes that some of her hair is starting to shed when she combs it. For some reason, Janet Fletcher has been saving her hair in a ziploc bag as it falls out. When asked why she's doing this, Janet Fletcher says "I don't know why."  She confirms that she's sleeping at night. When asked if she needs refills on anything, she says no. She notes that she's been drinking four nutritional drinks a  day.  During the physical exam, she sounds much clearer and confirms that her breathing is a whole lot easier. She also denies any nausea.  Her daughters note that they swap out on shifts taking care of her and taking her to her appointments. They said they told her they are going to keep her accountable for beating this thing. Her daughter also notes that since she's not there with her all the time, she's not sure how consistent her appetite is. Janet Fletcher notes that "yesterday I ate all day long."   She denies diarrhea. No neuropathy. No other concerns or complaints.   MEDICAL HISTORY:  Past Medical History  Diagnosis Date  . Hyperlipidemia   . Allergy   . Osteopenia   . Low vitamin B12 level 12/15/2015  . Iron deficiency anemia 12/15/2015    SURGICAL HISTORY: Past Surgical History  Procedure Laterality Date  . Small intestine surgery    . Cataract extraction, bilateral Bilateral   . Minor excision ear canal cyst Left     30+ years ago  . Video bronchoscopy with endobronchial ultrasound N/A 12/11/2015    Procedure: VIDEO BRONCHOSCOPY WITH ENDOBRONCHIAL ULTRASOUND;  Surgeon: Melrose Nakayama, MD;  Location: [place];  Service: Thoracic;  Laterality: N/A;    SOCIAL HISTORY: Social History   Social History  . Marital Status: Married    Spouse Name: N/A  . Number of Children: N/A  . Years of Education: N/A   Occupational History  . Not on file.   Social History Main Topics  . Smoking status:  Current Some Day Smoker -- 0.25 packs/day for 50 years  . Smokeless tobacco: Never Used  . Alcohol Use: No  . Drug Use: No  . Sexual Activity: Not on file   Other Topics Concern  . Not on file   Social History Narrative  Married; 67 years 5 daughters 2 sons 12 grandchildren, 8 great grandchildren Smokes now but not as much; started long time ago. 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 Cancer Mother   . Hypertension Father   Mother died at 26 years old. Father died at 61 years old. 7 sisters and 7 brothers 35 living Older sister died of leukemia.   ALLERGIES:  is allergic to acetaminophen; flagyl; and penicillins.  MEDICATIONS:  Current Outpatient Prescriptions  Medication Sig Dispense Refill  . cefdinir (OMNICEF) 300 MG capsule Take 300 mg by mouth 2 (two) times daily.    Marland Kitchen CISPLATIN IV Inject into the vein. Day 1 every 21 days (to begin 12/13/15)    . ETOPOSIDE IV Inject into the vein. Days 1-3 every 21 days (to begin 12/13/15)    . lidocaine-prilocaine (EMLA) cream Apply a quarter size amount to port site 1 hour prior to chemo. Do not rub in. Cover with plastic wrap. 30 g 3  . ondansetron (ZOFRAN) 8 MG tablet Take 1 tablet (8 mg total) by mouth every 8 (eight) hours as needed for nausea or vomiting. 30 tablet 2  . Pegfilgrastim (NEULASTA ONPRO Taylor) Inject into the skin. To be administered 27 hours after the completion of chemo.    . Polysacchar Iron-FA-B12 (FERREX 150 FORTE) 150-1-25 MG-MG-MCG CAPS Take 1 capsule by mouth daily. 30 capsule 5  . prochlorperazine (COMPAZINE) 10 MG tablet Take 1 tablet (10 mg total) by mouth every 6 (six) hours as needed for nausea or vomiting. 30 tablet 2  . simvastatin (ZOCOR) 40 MG tablet Take 1 tablet (40 mg total) by mouth at bedtime. 90 tablet 3   No current facility-administered medications for this visit.    Review of Systems  Constitutional: Positive for weight loss. Negative for fever, chills and malaise/fatigue.       Appetite loss.  HENT: Negative.  Negative for congestion, hearing loss, nosebleeds, sore throat and tinnitus.   Eyes: Negative.  Negative for blurred vision, double vision, pain and discharge.  Respiratory: Positive for cough. Negative for hemoptysis, sputum production, shortness of breath and wheezing.   Cardiovascular: Negative.   Negative for chest pain, palpitations, claudication, leg swelling and PND.  Gastrointestinal: Negative.  Negative for heartburn, nausea, vomiting, abdominal pain, diarrhea, constipation, blood in stool and melena.  Genitourinary: Negative.  Negative for dysuria, urgency, frequency and hematuria.  Musculoskeletal: Negative.  Negative for myalgias, joint pain and falls.  Skin: Negative.  Negative for itching and rash.  Neurological: Negative.  Negative for dizziness, tingling, tremors, sensory change, speech change, focal weakness, seizures, loss of consciousness, weakness and headaches.  Endo/Heme/Allergies: Negative.  Does not bruise/bleed easily.  Psychiatric/Behavioral: Negative.  Negative for depression, suicidal ideas, memory loss and substance abuse. The patient is not nervous/anxious and does not have insomnia.   All other systems reviewed and are negative.  14 point ROS was done and is otherwise as detailed above or in HPI   PHYSICAL EXAMINATION: ECOG PERFORMANCE STATUS: 1 - Symptomatic but completely ambulatory   Vitals - 1  value per visit 0/17/5102  SYSTOLIC 585  DIASTOLIC 67  Pulse 69  Temperature 98  Respirations 16  Weight (lb) 119  Height   BMI 20.42  VISIT REPORT     Physical Exam  Constitutional: She is oriented to person, place, and time and well-developed, well-nourished, and in no distress.  HENT:  Head: Normocephalic and atraumatic.  Nose: Nose normal.  Mouth/Throat: Oropharynx is clear and moist. No oropharyngeal exudate.  Eyes: Conjunctivae and EOM are normal. Pupils are equal, round, and reactive to light. Right eye exhibits no discharge. Left eye exhibits no discharge. No scleral icterus.  Neck: Normal range of motion. Neck supple. No tracheal deviation present. No thyromegaly present.  Cardiovascular: Normal rate, regular rhythm and normal heart sounds.  Exam reveals no gallop and no friction rub.   No murmur heard. Pulmonary/Chest: Effort normal and  breath sounds normal. She has no wheezes. She has no rales.  Breath sounds very clear. Abdominal: Soft. Bowel sounds are normal. She exhibits no distension and no mass. There is no tenderness. There is no rebound and no guarding.  Musculoskeletal: Normal range of motion. She exhibits no edema.  Lymphadenopathy:    She has no cervical adenopathy.  Neurological: She is alert and oriented to person, place, and time. She has normal reflexes. No cranial nerve deficit. Gait normal. Coordination normal.  Skin: Skin is warm and dry. No rash noted.  Psychiatric: Mood, memory, affect and judgment normal.  Nursing note and vitals reviewed.   LABORATORY DATA:  I have reviewed the data as listed  CBC    Component Value Date/Time   WBC 3.8* 01/02/2016 0651   WBC 5.5 08/20/2013 1559   RBC 3.71* 01/02/2016 0651   RBC 4.6 08/20/2013 1559   HGB 9.1* 01/02/2016 0651   HGB 10.1* 08/20/2013 1559   HCT 29.7* 01/02/2016 0651   HCT 33.1* 08/20/2013 1559   PLT 427* 01/02/2016 0651   MCV 80.1 01/02/2016 0651   MCV 71.9* 08/20/2013 1559   MCH 24.5* 01/02/2016 0651   MCH 21.9* 08/20/2013 1559   MCHC 30.6 01/02/2016 0651   MCHC 30.5* 08/20/2013 1559   RDW 15.6* 01/02/2016 0651   LYMPHSABS 1.0 01/02/2016 0651   MONOABS 0.5 01/02/2016 0651   EOSABS 0.0 01/02/2016 0651   BASOSABS 0.0 01/02/2016 0651   CMP     Component Value Date/Time   NA 135 12/21/2015 0833   NA 143 09/29/2014 1203   K 4.4 12/21/2015 0833   CL 101 12/21/2015 0833   CO2 28 12/21/2015 0833   GLUCOSE 96 12/21/2015 0833   GLUCOSE 78 09/29/2014 1203   BUN 22* 12/21/2015 0833   BUN 12 09/29/2014 1203   CREATININE 0.94 12/21/2015 0833   CREATININE 0.76 01/06/2013 1428   CALCIUM 8.8* 12/21/2015 0833   PROT 6.6 12/21/2015 0833   PROT 6.6 09/29/2014 1203   ALBUMIN 3.4* 12/21/2015 0833   ALBUMIN 3.9 09/29/2014 1203   AST 25 12/21/2015 0833   ALT 15 12/21/2015 0833   ALKPHOS 55 12/21/2015 0833   BILITOT 0.4 12/21/2015 0833    BILITOT 0.4 09/29/2014 1203   GFRNONAA 57* 12/21/2015 0833   GFRNONAA 78 01/06/2013 1428   GFRAA >60 12/21/2015 0833   GFRAA 89 01/06/2013 1428    RADIOGRAPHIC STUDIES: I have personally reviewed the radiological images as listed and agreed with the findings in the report. No results found.   Study Result     CLINICAL DATA: Initial treatment strategy for mediastinal mass.  EXAM:  NUCLEAR MEDICINE PET SKULL BASE TO THIGH  TECHNIQUE: Thirteen mCi F-18 FDG was injected intravenously. Full-ring PET imaging was performed from the skull base to thigh after the radiotracer. CT data was obtained and used for attenuation correction and anatomic localization.  FASTING BLOOD GLUCOSE: Value: 83 mg/dl  COMPARISON: CT 11/28/2015  FINDINGS: NECK  Hypermetabolic nodules in the central RIGHT upper lobe measuring approximately 1 cm each (image 99, series 3) with intense metabolic activity (SUV max 11.9). Small nodule in the RIGHT upper lobe medially measuring 10 mm on image 90, series 3 has mild metabolic activity.  The large RIGHT paratracheal mass measures 7 cm with intense peripheral metabolic activity with SUV max equal 12.9. This metabolic activity extends to the high RIGHT paratracheal location. Mass surrounds the SVC.  No LEFT lung hypermetabolic nodules.  CHEST  No hypermetabolic mediastinal or hilar nodes. No suspicious pulmonary nodules on the CT scan.  ABDOMEN/PELVIS  No abnormal hypermetabolic activity within the liver, pancreas, adrenal glands, or spleen. No hypermetabolic lymph nodes in the abdomen or pelvis. There is dense sclerosis of the aorta which extends into the lumen. Uterus normal.  SKELETON  No focal hypermetabolic activity to suggest skeletal metastasis.  IMPRESSION: 1. Large intensely hypermetabolic paratracheal mass most consistent with metastatic small cell lung carcinoma. 2. Perihilar nodular densities in the RIGHT middle  lobe are likely pulmonary metastasis. 3. Mild metabolic activity associated are RIGHT upper lobe nodule may represent primary lesion. 4. No evidence of distant metastatic disease. 5. Dense sclerosis of the aorta is extends into the lumen of the infrarenal abdominal aorta LEFT common iliac artery.   Electronically Signed  By: Suzy Bouchard M.D.  On: 12/05/2015 17:19    PATHOLOGY    ASSESSMENT & PLAN:  Limited stage small cell lung cancer Tobacco Abuse Anemia Low B12 Iron deficiency  She is doing quite well. We will proceed with therapy today. I will plan on seeing her back in 3 weeks with repeat labs, PE and treatment. She is currently receiving XRT in [place]. She has no additional questions.  She does not need refills.  We will continue her B12. Anemia will continue to be monitored. She is to continue on her oral iron. She is tolerating this well.   I will try to find her some boost samples today.  All questions were answered. The patient knows to call the clinic with any problems, questions or concerns.  This document serves as a record of services personally performed by Ancil Linsey, MD. It was created on her behalf by Toni Amend, a trained medical scribe. The creation of this record is based on the scribe's personal observations and the provider's statements to them. This document has been checked and approved by the attending provider.  I have reviewed the above documentation for accuracy and completeness, and I agree with the above.  This note was electronically signed.  Molli Hazard, MD  01/02/2016 10:10 AM

## 2016-01-02 NOTE — Progress Notes (Signed)
[name] Tolerated chemotherapy well today Discharged ambulatory

## 2016-01-02 NOTE — Patient Instructions (Signed)
[place] at [facility] Discharge Instructions  RECOMMENDATIONS MADE BY THE CONSULTANT AND ANY TEST RESULTS WILL BE SENT TO YOUR REFERRING PHYSICIAN.  Exam done and seen today by Dr. Gustavus Bryant with treatment today. Labs in 3 weeks with chemo and chemo teaching Return to see the doctor in 3 weeks Please call the clinic if you have any questions or concerns  Thank you for choosing [place] at [facility] to provide your oncology and hematology care.  To afford each patient quality time with our provider, please arrive at least 15 minutes before your scheduled appointment time.   Beginning January 23rd 2017 lab work for the [facility] will be done in the  Main lab at [facility] on 1st floor. If you have a lab appointment with the [facility] please come in thru the  Main Entrance and check in at the main information desk  You need to re-schedule your appointment should you arrive 10 or more minutes late.  We strive to give you quality time with our providers, and arriving late affects you and other patients whose appointments are after yours.  Also, if you no show three or more times for appointments you may be dismissed from the clinic at the providers discretion.     Again, thank you for choosing [facility].  Our hope is that these requests will decrease the amount of time that you wait before being seen by our physicians.       _____________________________________________________________  Should you have questions after your visit to [facility], please contact our office at [phone] between the hours of 8:30 a.m. and 4:30 p.m.  Voicemails left after 4:30 p.m. will not be returned until the following business day.  For prescription refill requests, have your pharmacy contact our office.         Resources For Cancer Patients and their Caregivers ? American Cancer Society: Can assist with  transportation, wigs, general needs, runs Look Good Feel Better.        [phone] ? Cancer Care: Provides financial assistance, online support groups, medication/co-pay assistance.  1-800-813-HOPE 802 475 8913) ? [employer] Assists [place] cancer patients and their families through emotional , educational and financial support.  [phone] ? [place] DSS Where to apply for food stamps, Medicaid and utility assistance. 754-130-4863 ? RCATS: Transportation to medical appointments. 765 194 8741 ? Social Security Administration: May apply for disability if have a Stage IV cancer. ([phone] ? [employer], Disability and Transit Services: Assists with nutrition, care and transit needs. [place] Support Programs: '@10RELATIVEDAYS'$ @ > Cancer Support Group  2nd Tuesday of the month 1pm-2pm, Journey Room  > Creative Journey  3rd Tuesday of the month 1130am-1pm, Journey Room  > Look Good Feel Better  1st Wednesday of the month 10am-12 noon, Journey Room (Call [place] to register [phone])

## 2016-01-03 ENCOUNTER — Encounter (HOSPITAL_COMMUNITY): Payer: Self-pay

## 2016-01-03 ENCOUNTER — Encounter (HOSPITAL_BASED_OUTPATIENT_CLINIC_OR_DEPARTMENT_OTHER): Payer: Commercial Managed Care - HMO

## 2016-01-03 VITALS — BP 139/54 | HR 62 | Temp 97.6°F | Resp 18 | Wt 123.0 lb

## 2016-01-03 DIAGNOSIS — C3411 Malignant neoplasm of upper lobe, right bronchus or lung: Secondary | ICD-10-CM | POA: Diagnosis not present

## 2016-01-03 DIAGNOSIS — Z5111 Encounter for antineoplastic chemotherapy: Secondary | ICD-10-CM

## 2016-01-03 DIAGNOSIS — C3491 Malignant neoplasm of unspecified part of right bronchus or lung: Secondary | ICD-10-CM | POA: Diagnosis not present

## 2016-01-03 DIAGNOSIS — Z510 Encounter for antineoplastic radiation therapy: Secondary | ICD-10-CM | POA: Diagnosis not present

## 2016-01-03 MED ORDER — SODIUM CHLORIDE 0.9 % IV SOLN
10.0000 mg | Freq: Once | INTRAVENOUS | Status: AC
  Administered 2016-01-03: 10 mg via INTRAVENOUS
  Filled 2016-01-03: qty 1

## 2016-01-03 MED ORDER — HEPARIN SOD (PORK) LOCK FLUSH 100 UNIT/ML IV SOLN
500.0000 [IU] | Freq: Once | INTRAVENOUS | Status: AC | PRN
  Administered 2016-01-03: 500 [IU]

## 2016-01-03 MED ORDER — SODIUM CHLORIDE 0.9% FLUSH: 10.0000 mL | INTRAVENOUS | Status: DC | PRN

## 2016-01-03 MED ORDER — SODIUM CHLORIDE 0.9 % IV SOLN
Freq: Once | INTRAVENOUS | Status: AC
  Administered 2016-01-03: 10:00:00 via INTRAVENOUS

## 2016-01-03 MED ORDER — SODIUM CHLORIDE 0.9 % IV SOLN
100.0000 mg/m2 | Freq: Once | INTRAVENOUS | Status: AC
  Administered 2016-01-03: 160 mg via INTRAVENOUS
  Filled 2016-01-03: qty 8

## 2016-01-03 NOTE — Progress Notes (Signed)
Lilli Light Bracher Tolerated chemotherapy well tolerated today. Discharged ambulatory

## 2016-01-03 NOTE — Patient Instructions (Signed)
[facility] Discharge Instructions for Patients Receiving Chemotherapy   Beginning January 23rd 2017 lab work for the Cancer Center will be done in the  Main lab at [place] [facility] on 1st floor. If you have a lab appointment with the Cancer Center please come in thru the  Main Entrance and check in at the main information desk   Today you received the following chemotherapy agents VP-16.  To help prevent nausea and vomiting after your treatment, we encourage you to take your nausea medication If you develop nausea and vomiting, or diarrhea that is not controlled by your medication, call the clinic.  The clinic phone number is [phone]. Office hours are Monday-Friday 8:30am-5:00pm.  BELOW ARE SYMPTOMS THAT SHOULD BE REPORTED IMMEDIATELY:  *FEVER GREATER THAN 101.0 F  *CHILLS WITH OR WITHOUT FEVER  NAUSEA AND VOMITING THAT IS NOT CONTROLLED WITH YOUR NAUSEA MEDICATION  *UNUSUAL SHORTNESS OF BREATH  *UNUSUAL BRUISING OR BLEEDING  TENDERNESS IN MOUTH AND THROAT WITH OR WITHOUT PRESENCE OF ULCERS  *URINARY PROBLEMS  *BOWEL PROBLEMS  UNUSUAL RASH Items with * indicate a potential emergency and should be followed up as soon as possible. If you have an emergency after office hours please contact your primary care physician or go to the nearest emergency department.  Please call the clinic during office hours if you have any questions or concerns.   You may also contact the Patient Navigator at [phone] should you have any questions or need assistance in obtaining follow up care.      Resources For Cancer Patients and their Caregivers ? American Cancer Society: Can assist with transportation, wigs, general needs, runs Look Good Feel Better.        1-888-227-6333 ? Cancer Care: Provides financial assistance, online support groups, medication/co-pay assistance.  1-800-813-HOPE (4673) ? [facility] Assists [place] cancer  patients and their families through emotional , educational and financial support.  336-427-4357 ? [place] DSS Where to apply for food stamps, Medicaid and utility assistance. 336-342-1394 ? RCATS: Transportation to medical appointments. 336-347-2287 ? Social Security Administration: May apply for disability if have a Stage IV cancer. 336-342-7796 1-800-772-1213 ? [place] Aging, Disability and Transit Services: Assists with nutrition, care and transit needs. 336-349-2343          

## 2016-01-04 ENCOUNTER — Encounter (HOSPITAL_BASED_OUTPATIENT_CLINIC_OR_DEPARTMENT_OTHER): Payer: Commercial Managed Care - HMO

## 2016-01-04 ENCOUNTER — Encounter (HOSPITAL_COMMUNITY): Payer: Self-pay

## 2016-01-04 ENCOUNTER — Inpatient Hospital Stay (HOSPITAL_COMMUNITY): Payer: Commercial Managed Care - HMO

## 2016-01-04 VITALS — BP 156/56 | HR 54 | Temp 97.8°F | Resp 20 | Wt 126.4 lb

## 2016-01-04 DIAGNOSIS — C3491 Malignant neoplasm of unspecified part of right bronchus or lung: Secondary | ICD-10-CM | POA: Diagnosis not present

## 2016-01-04 DIAGNOSIS — E538 Deficiency of other specified B group vitamins: Secondary | ICD-10-CM

## 2016-01-04 DIAGNOSIS — Z5111 Encounter for antineoplastic chemotherapy: Secondary | ICD-10-CM | POA: Diagnosis not present

## 2016-01-04 DIAGNOSIS — Z510 Encounter for antineoplastic radiation therapy: Secondary | ICD-10-CM | POA: Diagnosis not present

## 2016-01-04 DIAGNOSIS — C3411 Malignant neoplasm of upper lobe, right bronchus or lung: Secondary | ICD-10-CM | POA: Diagnosis not present

## 2016-01-04 MED ORDER — SODIUM CHLORIDE 0.9 % IV SOLN
10.0000 mg | Freq: Once | INTRAVENOUS | Status: AC
  Administered 2016-01-04: 10 mg via INTRAVENOUS
  Filled 2016-01-04: qty 1

## 2016-01-04 MED ORDER — SODIUM CHLORIDE 0.9 % IV SOLN
Freq: Once | INTRAVENOUS | Status: AC
  Administered 2016-01-04: 10:00:00 via INTRAVENOUS

## 2016-01-04 MED ORDER — SODIUM CHLORIDE 0.9 % IV SOLN
100.0000 mg/m2 | Freq: Once | INTRAVENOUS | Status: AC
  Administered 2016-01-04: 160 mg via INTRAVENOUS
  Filled 2016-01-04: qty 8

## 2016-01-04 MED ORDER — SODIUM CHLORIDE 0.9% FLUSH: 10.0000 mL | INTRAVENOUS | Status: DC | PRN

## 2016-01-04 MED ORDER — PEGFILGRASTIM 6 MG/0.6ML ~~LOC~~ PSKT
6.0000 mg | PREFILLED_SYRINGE | Freq: Once | SUBCUTANEOUS | Status: AC
  Administered 2016-01-04: 6 mg via SUBCUTANEOUS

## 2016-01-04 MED ORDER — HEPARIN SOD (PORK) LOCK FLUSH 100 UNIT/ML IV SOLN
500.0000 [IU] | Freq: Once | INTRAVENOUS | Status: AC | PRN
  Administered 2016-01-04: 500 [IU]
  Filled 2016-01-04: qty 5

## 2016-01-04 MED ORDER — CYANOCOBALAMIN 1000 MCG/ML IJ SOLN
1000.0000 ug | Freq: Once | INTRAMUSCULAR | Status: AC
  Administered 2016-01-04: 1000 ug via INTRAMUSCULAR
  Filled 2016-01-04: qty 1

## 2016-01-04 NOTE — Patient Instructions (Signed)
[place] at [facility] Discharge Instructions  RECOMMENDATIONS MADE BY THE CONSULTANT AND ANY TEST RESULTS WILL BE SENT TO YOUR REFERRING PHYSICIAN.  Chemotherapy today neulasta on-pro today b12 injection today Follow up as scheduled   Thank you for choosing [place] at [facility] to provide your oncology and hematology care.  To afford each patient quality time with our provider, please arrive at least 15 minutes before your scheduled appointment time.   Beginning January 23rd 2017 lab work for the [facility] will be done in the  Main lab at [facility] on 1st floor. If you have a lab appointment with the [facility] please come in thru the  Main Entrance and check in at the main information desk  You need to re-schedule your appointment should you arrive 10 or more minutes late.  We strive to give you quality time with our providers, and arriving late affects you and other patients whose appointments are after yours.  Also, if you no show three or more times for appointments you may be dismissed from the clinic at the providers discretion.     Again, thank you for choosing [facility].  Our hope is that these requests will decrease the amount of time that you wait before being seen by our physicians.       _____________________________________________________________  Should you have questions after your visit to [facility], please contact our office at [phone] between the hours of 8:30 a.m. and 4:30 p.m.  Voicemails left after 4:30 p.m. will not be returned until the following business day.  For prescription refill requests, have your pharmacy contact our office.         Resources For Cancer Patients and their Caregivers ? American Cancer Society: Can assist with transportation, wigs, general needs, runs Look Good Feel Better.        9012302465 ? Cancer Care: Provides financial  assistance, online support groups, medication/co-pay assistance.  1-800-813-HOPE 905-716-0354) ? [place] [facility] Assists [place] cancer patients and their families through emotional , educational and financial support.  470-464-4948 ? [place] DSS Where to apply for food stamps, Medicaid and utility assistance. (985)372-0312 ? RCATS: Transportation to medical appointments. 267-357-4563 ? Social Security Administration: May apply for disability if have a Stage IV cancer. (303)532-4524 787-551-6203 ? [employer], Disability and Transit Services: Assists with nutrition, care and transit needs. [place] Support Programs: '@10RELATIVEDAYS'$ @ > Cancer Support Group  2nd Tuesday of the month 1pm-2pm, Journey Room  > Creative Journey  3rd Tuesday of the month 1130am-1pm, Journey Room  > Look Good Feel Better  1st Wednesday of the month 10am-12 noon, Journey Room (Call [place] to register [phone])

## 2016-01-04 NOTE — Progress Notes (Signed)
Janet Fletcher Tolerated chemotherapy today Discharged ambultory  Janet Fletcher presents today for injection per the provider's orders.  Vitamin b12 given in left arm PER MD orders   .Marland KitchenLilli Light Searcypresents today for [employer] OBI placement per MD orders. OBI device filled per protocol and placed on right Upper Arm. Needle/catheter placement noted prior to patient leaving. Tolerated without incident and aware of injection to be delivered in  27 hours.

## 2016-01-05 DIAGNOSIS — C3411 Malignant neoplasm of upper lobe, right bronchus or lung: Secondary | ICD-10-CM | POA: Diagnosis not present

## 2016-01-05 DIAGNOSIS — Z510 Encounter for antineoplastic radiation therapy: Secondary | ICD-10-CM | POA: Diagnosis not present

## 2016-01-08 DIAGNOSIS — C3411 Malignant neoplasm of upper lobe, right bronchus or lung: Secondary | ICD-10-CM | POA: Diagnosis not present

## 2016-01-08 DIAGNOSIS — Z510 Encounter for antineoplastic radiation therapy: Secondary | ICD-10-CM | POA: Diagnosis not present

## 2016-01-09 DIAGNOSIS — Z510 Encounter for antineoplastic radiation therapy: Secondary | ICD-10-CM | POA: Diagnosis not present

## 2016-01-09 DIAGNOSIS — C3411 Malignant neoplasm of upper lobe, right bronchus or lung: Secondary | ICD-10-CM | POA: Diagnosis not present

## 2016-01-10 ENCOUNTER — Encounter: Payer: Self-pay | Admitting: Dietician

## 2016-01-10 ENCOUNTER — Encounter (HOSPITAL_BASED_OUTPATIENT_CLINIC_OR_DEPARTMENT_OTHER): Payer: Commercial Managed Care - HMO

## 2016-01-10 ENCOUNTER — Encounter (HOSPITAL_COMMUNITY): Payer: Self-pay

## 2016-01-10 VITALS — BP 105/60 | HR 84 | Temp 98.0°F | Resp 18 | Wt 118.0 lb

## 2016-01-10 DIAGNOSIS — Z888 Allergy status to other drugs, medicaments and biological substances status: Secondary | ICD-10-CM | POA: Diagnosis not present

## 2016-01-10 DIAGNOSIS — C3411 Malignant neoplasm of upper lobe, right bronchus or lung: Secondary | ICD-10-CM | POA: Diagnosis not present

## 2016-01-10 DIAGNOSIS — R634 Abnormal weight loss: Secondary | ICD-10-CM | POA: Diagnosis not present

## 2016-01-10 DIAGNOSIS — D649 Anemia, unspecified: Secondary | ICD-10-CM | POA: Diagnosis not present

## 2016-01-10 DIAGNOSIS — Z9889 Other specified postprocedural states: Secondary | ICD-10-CM | POA: Diagnosis not present

## 2016-01-10 DIAGNOSIS — Z510 Encounter for antineoplastic radiation therapy: Secondary | ICD-10-CM | POA: Diagnosis not present

## 2016-01-10 DIAGNOSIS — E785 Hyperlipidemia, unspecified: Secondary | ICD-10-CM | POA: Diagnosis not present

## 2016-01-10 DIAGNOSIS — M8580 Other specified disorders of bone density and structure, unspecified site: Secondary | ICD-10-CM | POA: Diagnosis not present

## 2016-01-10 DIAGNOSIS — C3491 Malignant neoplasm of unspecified part of right bronchus or lung: Secondary | ICD-10-CM | POA: Diagnosis not present

## 2016-01-10 DIAGNOSIS — C3490 Malignant neoplasm of unspecified part of unspecified bronchus or lung: Secondary | ICD-10-CM | POA: Diagnosis not present

## 2016-01-10 DIAGNOSIS — F17210 Nicotine dependence, cigarettes, uncomplicated: Secondary | ICD-10-CM | POA: Diagnosis not present

## 2016-01-10 LAB — BASIC METABOLIC PANEL
ANION GAP: 8 (ref 5–15)
BUN: 24 mg/dL — AB (ref 6–20)
CALCIUM: 8.2 mg/dL — AB (ref 8.9–10.3)
CO2: 24 mmol/L (ref 22–32)
Chloride: 103 mmol/L (ref 101–111)
Creatinine, Ser: 0.87 mg/dL (ref 0.44–1.00)
GFR calc Af Amer: >60 mL/min (ref >60)
GFR calc non Af Amer: >60 mL/min (ref >60)
GLUCOSE: 121 mg/dL — AB (ref 65–99)
Potassium: 3.8 mmol/L (ref 3.5–5.1)
Sodium: 135 mmol/L (ref 135–145)

## 2016-01-10 LAB — CBC WITH DIFFERENTIAL/PLATELET
BASOS ABS: 0 10*3/uL (ref 0.0–0.1)
Basophils Relative: 2 %
EOS PCT: 2 %
Eosinophils Absolute: 0 10*3/uL (ref 0.0–0.7)
HEMATOCRIT: 28.6 % — AB (ref 36.0–46.0)
Hemoglobin: 8.9 g/dL — ABNORMAL LOW (ref 12.0–15.0)
LYMPHS ABS: 0.3 10*3/uL — AB (ref 0.7–4.0)
LYMPHS PCT: 66 %
MCH: 24.4 pg — AB (ref 26.0–34.0)
MCHC: 31.1 g/dL (ref 30.0–36.0)
MCV: 78.4 fL (ref 78.0–100.0)
MONO ABS: 0 10*3/uL — AB (ref 0.1–1.0)
MONOS PCT: 4 %
NEUTROS ABS: 0.1 10*3/uL — AB (ref 1.7–7.7)
Neutrophils Relative %: 26 %
Platelets: 148 10*3/uL — ABNORMAL LOW (ref 150–400)
RBC: 3.65 MIL/uL — ABNORMAL LOW (ref 3.87–5.11)
RDW: 16.1 % — AB (ref 11.5–15.5)
WBC: 0.5 10*3/uL — CL (ref 4.0–10.5)

## 2016-01-10 MED ORDER — SODIUM CHLORIDE 0.9 % IV SOLN
INTRAVENOUS | Status: DC
  Administered 2016-01-10: 10:00:00 via INTRAVENOUS

## 2016-01-10 MED ORDER — SODIUM CHLORIDE 0.9% FLUSH: 10.0000 mL | INTRAVENOUS | Status: DC | PRN

## 2016-01-10 MED ORDER — HEPARIN SOD (PORK) LOCK FLUSH 100 UNIT/ML IV SOLN
INTRAVENOUS | Status: AC
  Filled 2016-01-10: qty 5

## 2016-01-10 MED ORDER — HEPARIN SOD (PORK) LOCK FLUSH 100 UNIT/ML IV SOLN
500.0000 [IU] | Freq: Once | INTRAVENOUS | Status: AC
  Administered 2016-01-10: 500 [IU] via INTRAVENOUS

## 2016-01-10 NOTE — Patient Instructions (Signed)
[place] at [facility] Discharge Instructions  RECOMMENDATIONS MADE BY THE CONSULTANT AND ANY TEST RESULTS WILL BE SENT TO YOUR REFERRING PHYSICIAN.  IV fluids today (NS one liter). NEUTROPENIC PRECAUTIONS (see attached information).  Call the clinic should you have any questions or concerns prior to your next visit.   Thank you for choosing [place] at [facility] to provide your oncology and hematology care.  To afford each patient quality time with our provider, please arrive at least 15 minutes before your scheduled appointment time.   Beginning January 23rd 2017 lab work for the [facility] will be done in the  Main lab at [facility] on 1st floor. If you have a lab appointment with the [facility] [facility] please come in thru the  Main Entrance and check in at the main information desk  You need to re-schedule your appointment should you arrive 10 or more minutes late.  We strive to give you quality time with our providers, and arriving late affects you and other patients whose appointments are after yours.  Also, if you no show three or more times for appointments you may be dismissed from the clinic at the providers discretion.     Again, thank you for choosing [facility].  Our hope is that these requests will decrease the amount of time that you wait before being seen by our physicians.       _____________________________________________________________  Should you have questions after your visit to [facility], please contact our office at [phone] between the hours of 8:30 a.m. and 4:30 p.m.  Voicemails left after 4:30 p.m. will not be returned until the following business day.  For prescription refill requests, have your pharmacy contact our office.         Resources For Cancer Patients and their Caregivers ? American Cancer Society: Can assist with transportation, wigs, general needs, runs Look  Good Feel Better.        907-593-3106 ? Cancer Care: Provides financial assistance, online support groups, medication/co-pay assistance.  1-800-813-HOPE 507-598-6888) ? [facility] [facility] Assists [place] cancer patients and their families through emotional , educational and financial support.  (737)269-9594 ? [place] DSS Where to apply for food stamps, Medicaid and utility assistance. (910) 082-3482 ? RCATS: Transportation to medical appointments. 570-314-4514 ? Social Security Administration: May apply for disability if have a Stage IV cancer. 629-770-9737 (781)791-1277 ? [employer], Disability and Transit Services: Assists with nutrition, care and transit needs. [place] Support Programs: '@10RELATIVEDAYS'$ @ > Cancer Support Group  2nd Tuesday of the month 1pm-2pm, Journey Room  > Creative Journey  3rd Tuesday of the month 1130am-1pm, Journey Room  > Look Good Feel Better  1st Wednesday of the month 10am-12 noon, Journey Room (Call [place] to register [phone])  Neutropenia Neutropenia is a condition that occurs when the level of a certain type of white blood cell (neutrophil) in your body becomes lower than normal. Neutrophils are made in the bone marrow and fight infections. These cells protect against bacteria and viruses. The fewer neutrophils you have, and the longer your body remains without them, the greater your risk of getting a severe infection becomes. CAUSES  The cause of neutropenia may be hard to determine. However, it is usually due to 3 main problems:   Decreased production of neutrophils. This may be due to:  Certain medicines such as chemotherapy.  Genetic  problems.  Cancer.  Radiation treatments.  Vitamin deficiency.  Some pesticides.  Increased destruction of neutrophils. This may be due to:  Overwhelming infections.  Hemolytic anemia. This is when the body destroys its own blood  cells.  Chemotherapy.  Neutrophils moving to areas of the body where they cannot fight infections. This may be due to:  Dialysis procedures.  Conditions where the spleen becomes enlarged. Neutrophils are held in the spleen and are not available to the rest of the body.  Overwhelming infections. The neutrophils are held in the area of the infection and are not available to the rest of the body. SYMPTOMS  There are no specific symptoms of neutropenia. The lack of neutrophils can result in an infection, and an infection can cause various problems. DIAGNOSIS  Diagnosis is made by a blood test. A complete blood count is performed. The normal level of neutrophils in human blood differs with age and race. Infants have lower counts than older children and adults. African Americans have lower counts than Caucasians or Asians. The average adult level is 1500 cells/mm3 of blood. Neutrophil counts are interpreted as follows:  Greater than 1000 cells/mm3 gives normal protection against infection.  500 to 1000 cells/mm3 gives an increased risk for infection.  200 to 500 cells/mm3 is a greater risk for severe infection.  Lower than 200 cells/mm3 is a marked risk of infection. This may require hospitalization and treatment with antibiotic medicines. TREATMENT  Treatment depends on the underlying cause, severity, and presence of infections or symptoms. It also depends on your health. Your caregiver will discuss the treatment plan with you. Mild cases are often easily treated and have a good outcome. Preventative measures may also be started to limit your risk of infections. Treatment can include:  Taking antibiotics.  Stopping medicines that are known to cause neutropenia.  Correcting nutritional deficiencies by eating green vegetables to supply folic acid and taking vitamin B supplements.  Stopping exposure to pesticides if your neutropenia is related to pesticide exposure.  Taking a blood growth  factor called sargramostim, pegfilgrastim, or filgrastim if you are undergoing chemotherapy for cancer. This stimulates white blood cell production.  Removal of the spleen if you have Felty's syndrome and have repeated infections. HOME CARE INSTRUCTIONS   Follow your caregiver's instructions about when you need to have blood work done.  Wash your hands often. Make sure others who come in contact with you also wash their hands.  Wash raw fruits and vegetables before eating them. They can carry bacteria and fungi.  Avoid people with colds or spreadable (contagious) diseases (chickenpox, herpes zoster, influenza).  Avoid large crowds.  Avoid construction areas. The dust can release fungus into the air.  Be cautious around children in daycare or school environments.  Take care of your respiratory system by coughing and deep breathing.  Bathe daily.  Protect your skin from cuts and burns.  Do not work in the garden or with flowers and plants.  Care for the mouth before and after meals by brushing with a soft toothbrush. If you have mucositis, do not use mouthwash. Mouthwash contains alcohol and can dry out the mouth even more.  Clean the area between the genitals and the anus (perineal area) after urination and bowel movements. Women need to wipe from front to back.  Use a water soluble lubricant during sexual intercourse and practice good hygiene after. Do not have intercourse if you are severely neutropenic. Check with your caregiver for guidelines.  Exercise daily  as tolerated.  Avoid people who were vaccinated with a live vaccine in the past 30 days. You should not receive live vaccines (polio, typhoid).  Do not provide direct care for pets. Avoid animal droppings. Do not clean litter boxes and bird cages.  Do not share food utensils.  Do not use tampons, enemas, or rectal suppositories unless directed by your caregiver.  Use an electric razor to remove hair.  Wash your  hands after handling magazines, letters, and newspapers. SEEK IMMEDIATE MEDICAL CARE IF:   You have a fever.  You have chills or start to shake.  You feel nauseous or vomit.  You develop mouth sores.  You develop aches and pains.  You have redness and swelling around open wounds.  Your skin is warm to the touch.  You have pus coming from your wounds.  You develop swollen lymph nodes.  You feel weak or fatigued.  You develop red streaks on the skin. MAKE SURE YOU:  Understand these instructions.  Will watch your condition.  Will get help right away if you are not doing well or get worse.   This information is not intended to replace advice given to you by your health care provider. Make sure you discuss any questions you have with your health care provider.   Document Released: 12/28/2001 Document Revised: 09/30/2011 Document Reviewed: 01/18/2015 Elsevier Interactive Patient Education [employer].

## 2016-01-10 NOTE — Progress Notes (Signed)
CRITICAL VALUE ALERT Critical value received:  WBC 0.5 Date of notification:  01-10-2016 Time of notification: 0347 Critical value read back:  Yes.   Nurse who received alert:  C.Magally Vahle RN MD notified (1st [place]):  Kirby Crigler PA-C

## 2016-01-10 NOTE — Progress Notes (Signed)
Patient identified to be at risk for malnutrition on the MST secondary to RN report of patient struggling with her hydration  Contacted Pt by visiting pt during infusion   Wt Readings from Last 10 Encounters:  01/10/16 118 lb (53.524 kg)  01/04/16 126 lb 6.4 oz (57.335 kg)  01/03/16 123 lb (55.792 kg)  01/02/16 119 lb (53.978 kg)  12/21/15 120 lb 3.2 oz (54.522 kg)  12/15/15 132 lb 9.6 oz (60.147 kg)  12/13/15 123 lb 8 oz (56.019 kg)  12/07/15 119 lb (53.978 kg)  12/06/15 119 lb 11.2 oz (54.296 kg)  11/29/15 122 lb (55.339 kg)   Patient weight has Decreased by 4 lbs since her initial appointment 1.5 months ago. Her weight varies.   Apparently, the radiation RN had said pt demonstrated orthostatic hypotension and was dehydrated. Pt sent to [facility] to receive fluids However, upon arrival to cancer PA did not feel she was dehydrated and BP results were not duplicated.   RD went over pt's fluid intake to assess for dehydration.   She reports she drinks 4-5 ensure supplements/day (8oz ea.). Family/caregivers in room are skeptical of this, but pt is adamant that she drinks this many. In addition, she says she drinks a 4 oz water at each of her 3 meals. She also eats soup (estimate 8 oz) roughly 1 time a day. Finally, she says she drinks 2-3 "large bottles" (assuming the 16 oz) of propel each day. If this is accurate, she is consuming >80 oz of fluid/day. This is more then enough as 1.7-1.9 liters would very likely adequately meet her fluid requirements. She reports her urine is always clear, never dark. Denied any symptoms of dehydration.   Pt sounds to be able to maintain adequate hydration. Given wt loss, Potentially her caloric intake is very poor   Her family members/caregivers report she eats very little. RD went over meal intake. She says she will eat some dumplings or a couple eggs at breakast. Her breakfast is her best meal.   Her daughters/caregivers express concern over her po intake.  They do not know why she wont eat more. Pt states she is a slow eater and has never eaten very much. She also notes a worse appetite since starting chemo. It sounds like pt's body has adapted to minimal po intake and now has poor appetite superimposed upon that.   RD did rough calorie count  If pt was actually drinking 4 ensure Plus supplements a day, she would be receiving 1400 kcals from those alone. A small meal and a few snacks would likely provides atleast another 500 kcals. This would more then likely meet her needs and leads to believe she is not drinking 4 ensure supplements a day.   Pt may in fact be overly hydrating on 0 kcal beverages. Her propels are 0 kcal and her 12 oz free water is also 0 kcal.  RD explained that high amounts of water and other 0 kcal beverages are promoted for weight loss as they fill you up w/o calories. She likely is eating less due to being full from all the 0 kcal fluids. RD asked that she switch out her 3 4 oz waters at her meals for a small glass of whole milk. THis would provide another 400 kcals. Encouraged her to drink 3 ensure plus each day and eat a small meal or two and she should have no trouble maintaining weight.   It is difficult to ascertain actually how much pt is  eating. She is a poor historian and sounds to over exaggerate her intake. Additionally, It does not sounds like her family/caregivers are fully aware of how much she is eating. If she continues to lose weight, may ask family members or pt to keep food log or start counting calories to make sure pt is meeting needs.   RD will order Ensure case for next visit.   Burtis Junes RD, LDN, [facility] Pager: [phone] 01/10/2016 1:51 PM

## 2016-01-10 NOTE — Progress Notes (Signed)
Received call from [place], Therapist, sports, radiation nurse at [facility] regarding pt.  States pt weighed last week at 123.6 lb and 117.2 lb in one week.  Also reports pt to have orthostatic hypotension. (sitting 97/62, standing 74/47). Discussed pt with T. Kefalas, PA; will bring pt in for fluids (NS 1L over 3 hours) and further evaluate.   0945:  Pt arrives A&Ox4, in no distress.  Denies pain, denies dizziness with postural changes. Orthostatic VS assessed.  Denies n/v/d; reports she is eating and drinking as usual. Pt is already using Ensure to supplement her diet. Pt encouraged to use Ensure, to eat small, frequent meals throughout the day, preferably things high in protein, fat, and calories. Verbalizes understanding.   1230:  Tolerated infusion well.  Informed of WBC and ANC and advised on neutropenic precautions; verbalizes understanding.  Discharged ambulatory in c/o family for transport home.

## 2016-01-11 DIAGNOSIS — Z510 Encounter for antineoplastic radiation therapy: Secondary | ICD-10-CM | POA: Diagnosis not present

## 2016-01-11 DIAGNOSIS — C3411 Malignant neoplasm of upper lobe, right bronchus or lung: Secondary | ICD-10-CM | POA: Diagnosis not present

## 2016-01-12 DIAGNOSIS — C3411 Malignant neoplasm of upper lobe, right bronchus or lung: Secondary | ICD-10-CM | POA: Diagnosis not present

## 2016-01-12 DIAGNOSIS — Z510 Encounter for antineoplastic radiation therapy: Secondary | ICD-10-CM | POA: Diagnosis not present

## 2016-01-15 DIAGNOSIS — C3411 Malignant neoplasm of upper lobe, right bronchus or lung: Secondary | ICD-10-CM | POA: Diagnosis not present

## 2016-01-15 DIAGNOSIS — Z510 Encounter for antineoplastic radiation therapy: Secondary | ICD-10-CM | POA: Diagnosis not present

## 2016-01-16 DIAGNOSIS — C3411 Malignant neoplasm of upper lobe, right bronchus or lung: Secondary | ICD-10-CM | POA: Diagnosis not present

## 2016-01-16 DIAGNOSIS — Z510 Encounter for antineoplastic radiation therapy: Secondary | ICD-10-CM | POA: Diagnosis not present

## 2016-01-17 DIAGNOSIS — Z510 Encounter for antineoplastic radiation therapy: Secondary | ICD-10-CM | POA: Diagnosis not present

## 2016-01-17 DIAGNOSIS — C3411 Malignant neoplasm of upper lobe, right bronchus or lung: Secondary | ICD-10-CM | POA: Diagnosis not present

## 2016-01-18 DIAGNOSIS — Z510 Encounter for antineoplastic radiation therapy: Secondary | ICD-10-CM | POA: Diagnosis not present

## 2016-01-18 DIAGNOSIS — C3411 Malignant neoplasm of upper lobe, right bronchus or lung: Secondary | ICD-10-CM | POA: Diagnosis not present

## 2016-01-19 DIAGNOSIS — Z510 Encounter for antineoplastic radiation therapy: Secondary | ICD-10-CM | POA: Diagnosis not present

## 2016-01-19 DIAGNOSIS — C3411 Malignant neoplasm of upper lobe, right bronchus or lung: Secondary | ICD-10-CM | POA: Diagnosis not present

## 2016-01-22 ENCOUNTER — Encounter (HOSPITAL_COMMUNITY): Payer: Commercial Managed Care - HMO | Attending: Hematology & Oncology

## 2016-01-22 DIAGNOSIS — C3490 Malignant neoplasm of unspecified part of unspecified bronchus or lung: Secondary | ICD-10-CM | POA: Insufficient documentation

## 2016-01-22 DIAGNOSIS — D649 Anemia, unspecified: Secondary | ICD-10-CM | POA: Insufficient documentation

## 2016-01-22 DIAGNOSIS — M8580 Other specified disorders of bone density and structure, unspecified site: Secondary | ICD-10-CM | POA: Diagnosis not present

## 2016-01-22 DIAGNOSIS — F17210 Nicotine dependence, cigarettes, uncomplicated: Secondary | ICD-10-CM | POA: Diagnosis not present

## 2016-01-22 DIAGNOSIS — Z510 Encounter for antineoplastic radiation therapy: Secondary | ICD-10-CM | POA: Diagnosis not present

## 2016-01-22 DIAGNOSIS — Z888 Allergy status to other drugs, medicaments and biological substances status: Secondary | ICD-10-CM | POA: Diagnosis not present

## 2016-01-22 DIAGNOSIS — Z9889 Other specified postprocedural states: Secondary | ICD-10-CM | POA: Diagnosis not present

## 2016-01-22 DIAGNOSIS — E785 Hyperlipidemia, unspecified: Secondary | ICD-10-CM | POA: Diagnosis not present

## 2016-01-22 DIAGNOSIS — C3491 Malignant neoplasm of unspecified part of right bronchus or lung: Secondary | ICD-10-CM

## 2016-01-22 DIAGNOSIS — C3411 Malignant neoplasm of upper lobe, right bronchus or lung: Secondary | ICD-10-CM | POA: Diagnosis not present

## 2016-01-22 LAB — CBC WITH DIFFERENTIAL/PLATELET
Basophils Absolute: 0 10*3/uL (ref 0.0–0.1)
Basophils Relative: 0 %
EOS PCT: 0 %
Eosinophils Absolute: 0 10*3/uL (ref 0.0–0.7)
HCT: 29.4 % — ABNORMAL LOW (ref 36.0–46.0)
HEMOGLOBIN: 9.1 g/dL — AB (ref 12.0–15.0)
LYMPHS ABS: 0.9 10*3/uL (ref 0.7–4.0)
LYMPHS PCT: 13 %
MCH: 24.9 pg — AB (ref 26.0–34.0)
MCHC: 31 g/dL (ref 30.0–36.0)
MCV: 80.3 fL (ref 78.0–100.0)
MONOS PCT: 12 %
Monocytes Absolute: 0.8 10*3/uL (ref 0.1–1.0)
NEUTROS PCT: 75 %
Neutro Abs: 5.1 10*3/uL (ref 1.7–7.7)
Platelets: 225 10*3/uL (ref 150–400)
RBC: 3.66 MIL/uL — AB (ref 3.87–5.11)
RDW: 18.3 % — ABNORMAL HIGH (ref 11.5–15.5)
WBC: 6.8 10*3/uL (ref 4.0–10.5)

## 2016-01-22 LAB — MAGNESIUM: MAGNESIUM: 2.1 mg/dL (ref 1.7–2.4)

## 2016-01-22 LAB — COMPREHENSIVE METABOLIC PANEL
ALK PHOS: 67 U/L (ref 38–126)
ALT: 10 U/L — AB (ref 14–54)
AST: 22 U/L (ref 15–41)
Albumin: 3.3 g/dL — ABNORMAL LOW (ref 3.5–5.0)
Anion gap: 5 (ref 5–15)
BUN: 19 mg/dL (ref 6–20)
CALCIUM: 8.4 mg/dL — AB (ref 8.9–10.3)
CO2: 28 mmol/L (ref 22–32)
CREATININE: 0.94 mg/dL (ref 0.44–1.00)
Chloride: 104 mmol/L (ref 101–111)
GFR, EST NON AFRICAN AMERICAN: 57 mL/min — AB (ref >60)
Glucose, Bld: 94 mg/dL (ref 65–99)
Potassium: 4.4 mmol/L (ref 3.5–5.1)
Sodium: 137 mmol/L (ref 135–145)
TOTAL PROTEIN: 6.2 g/dL — AB (ref 6.5–8.1)
Total Bilirubin: 0.4 mg/dL (ref 0.3–1.2)

## 2016-01-23 NOTE — Assessment & Plan Note (Signed)
B12 deficiency with negative intrinsic factor and anti-parietal cell antibody testing on 12/15/2015.  Oncology Flowsheet 12/15/2015 12/21/2015 12/29/2015  cyanocobalamin ((VITAMIN B-12)) IM 1,000 mcg 1,000 mcg 1,000 mcg   Oncology Flowsheet 01/02/2016 01/03/2016 01/04/2016  cyanocobalamin ((VITAMIN B-12)) IM   1,000 mcg    B12 injection is due next week.

## 2016-01-23 NOTE — Assessment & Plan Note (Addendum)
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  Currently receiving thoracic XRT as well.  Oncology history is updated.  For patients with LS-SCLC treated with contemporary chemoradiotherapy and prophylactic cranial irradiation, overall response rates of 80 to 90 percent, including 50 to 60 percent complete response rates, are typically reported. Median survival is around 17 months, and the five-year survival rate is about 20 percent.  Labs today: CBC diff, CMET, Mg.  I personally reviewed and went over laboratory results with the patient.  The results are noted within this dictation.  Labs satisfy treatment parameters today.  Anemia is noted.  She denies any blood loss.  Will recheck a CBC next week.  She is being treated with curative intent and therefore, she is not a candidate for ESA therapy.  If anemia continues to be an issue or progresses, we will need to consider PRBC transfusion.  Her weight is relatively stable over the past 3 weeks, but overall, her weight is down. Burtis Junes is following patient along.  She is encouraged to increase her H2O intake.  She is supplementing her meals with Ensure.  She provides me her food history yesterday.  Breakfast consisted of eggs and sausage gravy.  Her lunch/dinner was a cook-out and her family member confirms that she ate well.  She notes 2 ensures yesterday throughout the day.  There was an interesting dynamic between the patient and her one daughter.  They were clearly "butting heads" regarding the patient's hydration status.  I have educated the patient that she needs 64-80 oz of H2O per day.  The patient's daughter seemed "redeemed" when the patient was told what her H2O intake should be; in an almost "see, I told you so" attitude.  Labs in 1 week with B12 injection: CBC, ferritin.  I am concerned about her progressive anemia.  She may need a transfusion based upon  her lab work next as mentioned.  Return in 3 weeks for follow-up and next cycle of chemotherapy.

## 2016-01-23 NOTE — Progress Notes (Signed)
Janet Fletcher, [place] [address]  Small cell carcinoma of right lung [facility]) - Plan: CBC  Low vitamin B12 level  Iron deficiency anemia - Plan: CBC, Ferritin  CURRENT THERAPY: Cisplatin/Etoposide beginning on 12/13/2015.  Also undergoing right thoracic XRT.  INTERVAL HISTORY: Janet Fletcher 80 y.o. 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    Small cell carcinoma of right lung ([place])   11/28/2015 Imaging Large anterior mediastinal mass in continuity with a R hilar mass, narrowing of SVC, abnormal densities in RUL, 9 mm spiculated nodule, nonspec. hypodensity in liver   12/05/2015 PET scan Large hypermetabolic paratracheal mass c/w SCLC, perihilar nodular densities in RML, mild metabolic activity RUL nodule, no distant metastatic disease   12/11/2015 Procedure Video bronch with biopsies and brushings, endobronchial ultrasound with mediastinal LN aspiration. Dr. Roxan Hockey   12/11/2015 Pathology Results Trachea biopsy negative, FNA RUL malignant cells c/w SCLC   12/13/2015 -  Chemotherapy Cisplatin/Etoposide   12/25/2015 Imaging MRI brain- No intracranial parenchymal enhancing lesion. Tiny right frontal calvarial enhancing lesion. Small metastatic lesion not excluded although this may represent an incidentally detected benign process.   12/29/2015 -  Radiation Therapy XRT   She is tolerating treatment well.  She denies any nausea or vomiting.  She does admit to a decrease in appetite. Her weight is relatively stable over the past 3 weeks, but overall, her weight is down.  Burtis Junes, RD is following.  She provides me with her meals yesterday and it sounds like she ate very well yesterday, including 2 ensures.  We discussed appropriate H2O intake and the importance of adequate hydration.    Review of Systems  Constitutional: Positive for weight loss. Negative for fever, chills and malaise/fatigue.  HENT: Negative.     Eyes: Negative.  Negative for blurred vision and double vision.  Respiratory: Negative.  Negative for cough, hemoptysis, sputum production and shortness of breath.   Cardiovascular: Negative.  Negative for chest pain.  Gastrointestinal: Negative.  Negative for nausea, vomiting, diarrhea and constipation.  Genitourinary: Negative.  Negative for dysuria, frequency and hematuria.  Musculoskeletal: Negative.   Skin: Negative.   Neurological: Negative.  Negative for dizziness, sensory change, speech change, focal weakness, seizures, loss of consciousness, weakness and headaches.  Endo/Heme/Allergies: Negative.   Psychiatric/Behavioral: Negative.     Past Medical History  Diagnosis Date  . Hyperlipidemia   . Allergy   . Osteopenia   . Low vitamin B12 level 12/15/2015  . Iron deficiency anemia 12/15/2015    Past Surgical History  Procedure Laterality Date  . Small intestine surgery    . Cataract extraction, bilateral Bilateral   . Minor excision ear canal cyst Left     30+ years ago  . Video bronchoscopy with endobronchial ultrasound N/A 12/11/2015    Procedure: VIDEO BRONCHOSCOPY WITH ENDOBRONCHIAL ULTRASOUND;  Surgeon: Melrose Nakayama, MD;  Location: [facility] OR;  Service: Thoracic;  Laterality: N/A;    Family History  Problem Relation Age of Onset  . Cancer Mother   . Hypertension Father     Social History   Social History  . Marital Status: Married    Spouse Name: N/A  . Number of Children: N/A  . Years of Education: N/A   Social History Main Topics  . Smoking status: Current Some Day Smoker -- 0.25 packs/day for 50 years  . Smokeless tobacco: Never Used  . Alcohol Use: No  .  Drug Use: No  . Sexual Activity: Not Asked   Other Topics Concern  . None   Social History Narrative     PHYSICAL EXAMINATION  ECOG PERFORMANCE STATUS: 1 - Symptomatic but completely ambulatory  Filed Vitals:   01/24/16 1028  BP: 93/55  Pulse: 77  Temp: 97.5 F (36.4 C)  Resp: 16     GENERAL:alert, no distress, well nourished, well developed, comfortable, cooperative, smiling and accompanied by her 2 daughters, in chemo-recliner. SKIN: skin color, texture, turgor are normal, no rashes or significant lesions HEAD: Normocephalic, No masses, lesions, tenderness or abnormalities EYES: normal, EOMI, Conjunctiva are pink and non-injected EARS: External ears normal OROPHARYNX:lips, buccal mucosa, and tongue normal and mucous membranes are moist  NECK: supple, thyroid normal size, non-tender, without nodularity, trachea midline LYMPH:  no palpable lymphadenopathy BREAST:not examined LUNGS: clear to auscultation and percussion HEART: regular rate & rhythm, no murmurs, no gallops, S1 normal and S2 normal ABDOMEN:abdomen soft, non-tender and normal bowel sounds BACK: Back symmetric, no curvature. EXTREMITIES:less then 2 second capillary refill, no joint deformities, effusion, or inflammation, no skin discoloration, no cyanosis  NEURO: alert & oriented x 3 with fluent speech, no focal motor/sensory deficits, gait normal   LABORATORY DATA: CBC    Component Value Date/Time   WBC 3.8* 01/24/2016 1022   WBC 5.5 08/20/2013 1559   RBC 3.40* 01/24/2016 1022   RBC 4.6 08/20/2013 1559   HGB 8.4* 01/24/2016 1022   HGB 10.1* 08/20/2013 1559   HCT 27.3* 01/24/2016 1022   HCT 33.1* 08/20/2013 1559   PLT 277 01/24/2016 1022   MCV 80.3 01/24/2016 1022   MCV 71.9* 08/20/2013 1559   MCH 24.7* 01/24/2016 1022   MCH 21.9* 08/20/2013 1559   MCHC 30.8 01/24/2016 1022   MCHC 30.5* 08/20/2013 1559   RDW 18.5* 01/24/2016 1022   LYMPHSABS 0.6* 01/24/2016 1022   MONOABS 0.5 01/24/2016 1022   EOSABS 0.0 01/24/2016 1022   BASOSABS 0.0 01/24/2016 1022      Chemistry      Component Value Date/Time   NA 136 01/24/2016 1022   NA 143 09/29/2014 1203   K 4.2 01/24/2016 1022   CL 104 01/24/2016 1022   CO2 26 01/24/2016 1022   BUN 23* 01/24/2016 1022   BUN 12 09/29/2014 1203    CREATININE 1.08* 01/24/2016 1022   CREATININE 0.76 01/06/2013 1428      Component Value Date/Time   CALCIUM 8.1* 01/24/2016 1022   ALKPHOS 58 01/24/2016 1022   AST 21 01/24/2016 1022   ALT 12* 01/24/2016 1022   BILITOT 0.2* 01/24/2016 1022   BILITOT 0.4 09/29/2014 1203     Lab Results  Component Value Date   FERRITIN 30 12/14/2015    PENDING LABS:   RADIOGRAPHIC STUDIES:  No results found.   PATHOLOGY:    ASSESSMENT AND PLAN:  Small cell carcinoma of right lung (HCC) Limited stage small cell lung cancer (right) began systemic chemotherapy on 12/13/2015 with curative intent Cisplatin/Etoposide.  Currently receiving thoracic XRT as well.  Oncology history is updated.  For patients with LS-SCLC treated with contemporary chemoradiotherapy and prophylactic cranial irradiation, overall response rates of 80 to 90 percent, including 50 to 60 percent complete response rates, are typically reported. Median survival is around 17 months, and the five-year survival rate is about 20 percent.  Labs today: CBC diff, CMET, Mg.  I personally reviewed and went over laboratory results with the patient.  The results are noted within this dictation.  Labs satisfy treatment parameters today.  Anemia is noted.  She denies any blood loss.  Will recheck a CBC next week.  She is being treated with curative intent and therefore, she is not a candidate for ESA therapy.  If anemia continues to be an issue or progresses, we will need to consider PRBC transfusion.  Her weight is relatively stable over the past 3 weeks, but overall, her weight is down. Burtis Junes is following patient along.  She is encouraged to increase her H2O intake.  She is supplementing her meals with Ensure.  She provides me her food history yesterday.  Breakfast consisted of eggs and sausage gravy.  Her lunch/dinner was a cook-out and her family member confirms that she ate well.  She notes 2 ensures yesterday throughout the  day.  There was an interesting dynamic between the patient and her one daughter.  They were clearly "butting heads" regarding the patient's hydration status.  I have educated the patient that she needs 64-80 oz of H2O per day.  The patient's daughter seemed "redeemed" when the patient was told what her H2O intake should be; in an almost "see, I told you so" attitude.  Labs in 1 week with B12 injection: CBC, ferritin.  I am concerned about her progressive anemia.  She may need a transfusion based upon her lab work next as mentioned.  Return in 3 weeks for follow-up and next cycle of chemotherapy.  Low vitamin B12 level B12 deficiency with negative intrinsic factor and anti-parietal cell antibody testing on 12/15/2015.  Oncology Flowsheet 12/15/2015 12/21/2015 12/29/2015  cyanocobalamin ((VITAMIN B-12)) IM 1,000 mcg 1,000 mcg 1,000 mcg   Oncology Flowsheet 01/02/2016 01/03/2016 01/04/2016  cyanocobalamin ((VITAMIN B-12)) IM   1,000 mcg    B12 injection is due next week.    ORDERS PLACED FOR THIS ENCOUNTER: Orders Placed This Encounter  Procedures  . CBC  . Ferritin    MEDICATIONS PRESCRIBED THIS ENCOUNTER: No orders of the defined types were placed in this encounter.    THERAPY PLAN:  Continue treatment as planned with curative intent.  All questions were answered. The patient knows to call the clinic with any problems, questions or concerns. We can certainly see the patient much sooner if necessary.  Patient and plan discussed with Dr. Ancil Linsey and she is in agreement with the aforementioned.   This note is electronically signed by: Doy Mince 01/24/2016 9:28 PM

## 2016-01-24 ENCOUNTER — Encounter (HOSPITAL_BASED_OUTPATIENT_CLINIC_OR_DEPARTMENT_OTHER): Payer: Commercial Managed Care - HMO | Admitting: Oncology

## 2016-01-24 ENCOUNTER — Ambulatory Visit (HOSPITAL_COMMUNITY): Payer: Commercial Managed Care - HMO | Admitting: Hematology & Oncology

## 2016-01-24 ENCOUNTER — Encounter (HOSPITAL_BASED_OUTPATIENT_CLINIC_OR_DEPARTMENT_OTHER): Payer: Commercial Managed Care - HMO

## 2016-01-24 ENCOUNTER — Ambulatory Visit (HOSPITAL_COMMUNITY): Payer: Commercial Managed Care - HMO

## 2016-01-24 ENCOUNTER — Inpatient Hospital Stay (HOSPITAL_COMMUNITY): Payer: Commercial Managed Care - HMO

## 2016-01-24 ENCOUNTER — Encounter (HOSPITAL_COMMUNITY): Payer: Self-pay | Admitting: Oncology

## 2016-01-24 VITALS — BP 93/55 | HR 77 | Temp 97.5°F | Resp 16 | Wt 117.0 lb

## 2016-01-24 VITALS — BP 137/64 | HR 72 | Temp 98.0°F | Resp 18

## 2016-01-24 DIAGNOSIS — Z510 Encounter for antineoplastic radiation therapy: Secondary | ICD-10-CM | POA: Diagnosis not present

## 2016-01-24 DIAGNOSIS — C3491 Malignant neoplasm of unspecified part of right bronchus or lung: Secondary | ICD-10-CM

## 2016-01-24 DIAGNOSIS — C3490 Malignant neoplasm of unspecified part of unspecified bronchus or lung: Secondary | ICD-10-CM | POA: Diagnosis not present

## 2016-01-24 DIAGNOSIS — F17210 Nicotine dependence, cigarettes, uncomplicated: Secondary | ICD-10-CM | POA: Diagnosis not present

## 2016-01-24 DIAGNOSIS — Z9889 Other specified postprocedural states: Secondary | ICD-10-CM | POA: Diagnosis not present

## 2016-01-24 DIAGNOSIS — Z888 Allergy status to other drugs, medicaments and biological substances status: Secondary | ICD-10-CM | POA: Diagnosis not present

## 2016-01-24 DIAGNOSIS — R7989 Other specified abnormal findings of blood chemistry: Secondary | ICD-10-CM

## 2016-01-24 DIAGNOSIS — Z5111 Encounter for antineoplastic chemotherapy: Secondary | ICD-10-CM | POA: Diagnosis not present

## 2016-01-24 DIAGNOSIS — D649 Anemia, unspecified: Secondary | ICD-10-CM | POA: Diagnosis not present

## 2016-01-24 DIAGNOSIS — E538 Deficiency of other specified B group vitamins: Secondary | ICD-10-CM | POA: Diagnosis not present

## 2016-01-24 DIAGNOSIS — D509 Iron deficiency anemia, unspecified: Secondary | ICD-10-CM

## 2016-01-24 DIAGNOSIS — E785 Hyperlipidemia, unspecified: Secondary | ICD-10-CM | POA: Diagnosis not present

## 2016-01-24 DIAGNOSIS — C3411 Malignant neoplasm of upper lobe, right bronchus or lung: Secondary | ICD-10-CM | POA: Diagnosis not present

## 2016-01-24 DIAGNOSIS — M8580 Other specified disorders of bone density and structure, unspecified site: Secondary | ICD-10-CM | POA: Diagnosis not present

## 2016-01-24 LAB — COMPREHENSIVE METABOLIC PANEL
ALBUMIN: 3.3 g/dL — AB (ref 3.5–5.0)
ALK PHOS: 58 U/L (ref 38–126)
ALT: 12 U/L — AB (ref 14–54)
AST: 21 U/L (ref 15–41)
Anion gap: 6 (ref 5–15)
BUN: 23 mg/dL — AB (ref 6–20)
CALCIUM: 8.1 mg/dL — AB (ref 8.9–10.3)
CHLORIDE: 104 mmol/L (ref 101–111)
CO2: 26 mmol/L (ref 22–32)
CREATININE: 1.08 mg/dL — AB (ref 0.44–1.00)
GFR calc non Af Amer: 48 mL/min — ABNORMAL LOW (ref >60)
GFR, EST AFRICAN AMERICAN: 56 mL/min — AB (ref >60)
GLUCOSE: 91 mg/dL (ref 65–99)
Potassium: 4.2 mmol/L (ref 3.5–5.1)
SODIUM: 136 mmol/L (ref 135–145)
Total Bilirubin: 0.2 mg/dL — ABNORMAL LOW (ref 0.3–1.2)
Total Protein: 6 g/dL — ABNORMAL LOW (ref 6.5–8.1)

## 2016-01-24 LAB — CBC WITH DIFFERENTIAL/PLATELET
BASOS ABS: 0 10*3/uL (ref 0.0–0.1)
BASOS PCT: 0 %
EOS ABS: 0 10*3/uL (ref 0.0–0.7)
EOS PCT: 0 %
HCT: 27.3 % — ABNORMAL LOW (ref 36.0–46.0)
HEMOGLOBIN: 8.4 g/dL — AB (ref 12.0–15.0)
LYMPHS ABS: 0.6 10*3/uL — AB (ref 0.7–4.0)
Lymphocytes Relative: 16 %
MCH: 24.7 pg — ABNORMAL LOW (ref 26.0–34.0)
MCHC: 30.8 g/dL (ref 30.0–36.0)
MCV: 80.3 fL (ref 78.0–100.0)
Monocytes Absolute: 0.5 10*3/uL (ref 0.1–1.0)
Monocytes Relative: 13 %
NEUTROS PCT: 71 %
Neutro Abs: 2.7 10*3/uL (ref 1.7–7.7)
PLATELETS: 277 10*3/uL (ref 150–400)
RBC: 3.4 MIL/uL — AB (ref 3.87–5.11)
RDW: 18.5 % — ABNORMAL HIGH (ref 11.5–15.5)
WBC: 3.8 10*3/uL — AB (ref 4.0–10.5)

## 2016-01-24 LAB — MAGNESIUM: Magnesium: 1.9 mg/dL (ref 1.7–2.4)

## 2016-01-24 MED ORDER — SODIUM CHLORIDE 0.9% FLUSH: 10.0000 mL | INTRAVENOUS | Status: DC | PRN

## 2016-01-24 MED ORDER — HEPARIN SOD (PORK) LOCK FLUSH 100 UNIT/ML IV SOLN
500.0000 [IU] | Freq: Once | INTRAVENOUS | Status: AC | PRN
  Administered 2016-01-24: 500 [IU]

## 2016-01-24 MED ORDER — SODIUM CHLORIDE 0.9 % IV SOLN
100.0000 mg/m2 | Freq: Once | INTRAVENOUS | Status: AC
  Administered 2016-01-24: 160 mg via INTRAVENOUS
  Filled 2016-01-24: qty 8

## 2016-01-24 MED ORDER — HEPARIN SOD (PORK) LOCK FLUSH 100 UNIT/ML IV SOLN
INTRAVENOUS | Status: AC
  Filled 2016-01-24: qty 5

## 2016-01-24 MED ORDER — POTASSIUM CHLORIDE 2 MEQ/ML IV SOLN
Freq: Once | INTRAVENOUS | Status: AC
  Administered 2016-01-24: 12:00:00 via INTRAVENOUS
  Filled 2016-01-24: qty 10

## 2016-01-24 MED ORDER — PALONOSETRON HCL INJECTION 0.25 MG/5ML
0.2500 mg | Freq: Once | INTRAVENOUS | Status: AC
  Administered 2016-01-24: 0.25 mg via INTRAVENOUS
  Filled 2016-01-24: qty 5

## 2016-01-24 MED ORDER — SODIUM CHLORIDE 0.9 % IV SOLN
80.0000 mg/m2 | Freq: Once | INTRAVENOUS | Status: AC
  Administered 2016-01-24: 125 mg via INTRAVENOUS
  Filled 2016-01-24: qty 125

## 2016-01-24 MED ORDER — SODIUM CHLORIDE 0.9 % IV SOLN
Freq: Once | INTRAVENOUS | Status: AC
  Administered 2016-01-24: 14:00:00 via INTRAVENOUS
  Filled 2016-01-24: qty 5

## 2016-01-24 NOTE — Progress Notes (Signed)
Tolerated Cisplatin and VP16 without any problems.

## 2016-01-24 NOTE — Patient Instructions (Signed)
[place] at [facility] Discharge Instructions  RECOMMENDATIONS MADE BY THE CONSULTANT AND ANY TEST RESULTS WILL BE SENT TO YOUR REFERRING PHYSICIAN.  You were seen by Kirby Crigler, PA today. You will have your chemo treatment today. We will check lab work in 1 week with your next B12 injection to evaluate anemia. Return as scheduled for your follow-up visit. Call clinic with any questions or concerns.   Thank you for choosing [place] at [facility] to provide your oncology and hematology care.  To afford each patient quality time with our provider, please arrive at least 15 minutes before your scheduled appointment time.   Beginning January 23rd 2017 lab work for the [facility] will be done in the  Main lab at [facility] on 1st floor. If you have a lab appointment with the [facility] please come in thru the  Main Entrance and check in at the main information desk  You need to re-schedule your appointment should you arrive 10 or more minutes late.  We strive to give you quality time with our providers, and arriving late affects you and other patients whose appointments are after yours.  Also, if you no show three or more times for appointments you may be dismissed from the clinic at the providers discretion.     Again, thank you for choosing [facility].  Our hope is that these requests will decrease the amount of time that you wait before being seen by our physicians.       _____________________________________________________________  Should you have questions after your visit to [facility], please contact our office at [phone] between the hours of 8:30 a.m. and 4:30 p.m.  Voicemails left after 4:30 p.m. will not be returned until the following business day.  For prescription refill requests, have your pharmacy contact our office.         Resources For Cancer Patients and their Caregivers ? American  Cancer Society: Can assist with transportation, wigs, general needs, runs Look Good Feel Better.        (236) 233-2720 ? Cancer Care: Provides financial assistance, online support groups, medication/co-pay assistance.  1-800-813-HOPE (731)525-1122) ? [facility] Assists [place] cancer patients and their families through emotional , educational and financial support.  267-447-2445 ? [place] DSS Where to apply for food stamps, Medicaid and utility assistance. 667-754-0676 ? RCATS: Transportation to medical appointments. (551)732-2166 ? Social Security Administration: May apply for disability if have a Stage IV cancer. (478)202-6853 (365) 056-9849 ? [employer], Disability and Transit Services: Assists with nutrition, care and transit needs. [place] [facility] [place] Support Programs: '@10RELATIVEDAYS'$ @ > Cancer Support Group  2nd Tuesday of the month 1pm-2pm, Journey Room  > Creative Journey  3rd Tuesday of the month 1130am-1pm, Journey Room  > Look Good Feel Better  1st Wednesday of the month 10am-12 noon, Journey Room (Call [place] to register [phone])

## 2016-01-25 ENCOUNTER — Inpatient Hospital Stay (HOSPITAL_COMMUNITY): Payer: Commercial Managed Care - HMO

## 2016-01-25 ENCOUNTER — Encounter (HOSPITAL_BASED_OUTPATIENT_CLINIC_OR_DEPARTMENT_OTHER): Payer: Commercial Managed Care - HMO

## 2016-01-25 ENCOUNTER — Encounter (HOSPITAL_COMMUNITY): Payer: Self-pay

## 2016-01-25 VITALS — BP 150/75 | HR 70 | Temp 97.5°F | Resp 18 | Wt 122.4 lb

## 2016-01-25 DIAGNOSIS — C3491 Malignant neoplasm of unspecified part of right bronchus or lung: Secondary | ICD-10-CM | POA: Diagnosis not present

## 2016-01-25 DIAGNOSIS — Z510 Encounter for antineoplastic radiation therapy: Secondary | ICD-10-CM | POA: Diagnosis not present

## 2016-01-25 DIAGNOSIS — Z5111 Encounter for antineoplastic chemotherapy: Secondary | ICD-10-CM

## 2016-01-25 DIAGNOSIS — C3411 Malignant neoplasm of upper lobe, right bronchus or lung: Secondary | ICD-10-CM | POA: Diagnosis not present

## 2016-01-25 MED ORDER — SODIUM CHLORIDE 0.9 % IV SOLN
Freq: Once | INTRAVENOUS | Status: AC
  Administered 2016-01-25: 10:00:00 via INTRAVENOUS

## 2016-01-25 MED ORDER — SODIUM CHLORIDE 0.9% FLUSH: 10.0000 mL | INTRAVENOUS | Status: DC | PRN

## 2016-01-25 MED ORDER — SODIUM CHLORIDE 0.9 % IV SOLN
100.0000 mg/m2 | Freq: Once | INTRAVENOUS | Status: AC
  Administered 2016-01-25: 160 mg via INTRAVENOUS
  Filled 2016-01-25: qty 8

## 2016-01-25 MED ORDER — SODIUM CHLORIDE 0.9 % IV SOLN
10.0000 mg | Freq: Once | INTRAVENOUS | Status: AC
  Administered 2016-01-25: 10 mg via INTRAVENOUS
  Filled 2016-01-25: qty 1

## 2016-01-25 MED ORDER — HEPARIN SOD (PORK) LOCK FLUSH 100 UNIT/ML IV SOLN
500.0000 [IU] | Freq: Once | INTRAVENOUS | Status: AC | PRN
  Administered 2016-01-25: 500 [IU]

## 2016-01-25 NOTE — Progress Notes (Signed)
[name] Tolerated chemotherapy well today Discharged ambulatory

## 2016-01-25 NOTE — Patient Instructions (Signed)
[facility] Discharge Instructions for Patients Receiving Chemotherapy   Beginning January 23rd 2017 lab work for the Cancer Center will be done in the  Main lab at [facility] on 1st floor. If you have a lab appointment with the Cancer Center please come in thru the  Main Entrance and check in at the main information desk   Today you received the following chemotherapy agents VP-16.Follow-up as scheduled  To help prevent nausea and vomiting after your treatment, we encourage you to take your nausea medication    If you develop nausea and vomiting, or diarrhea that is not controlled by your medication, call the clinic.  The clinic phone number is [phone]. Office hours are Monday-Friday 8:30am-5:00pm.  BELOW ARE SYMPTOMS THAT SHOULD BE REPORTED IMMEDIATELY:  *FEVER GREATER THAN 101.0 F  *CHILLS WITH OR WITHOUT FEVER  NAUSEA AND VOMITING THAT IS NOT CONTROLLED WITH YOUR NAUSEA MEDICATION  *UNUSUAL SHORTNESS OF BREATH  *UNUSUAL BRUISING OR BLEEDING  TENDERNESS IN MOUTH AND THROAT WITH OR WITHOUT PRESENCE OF ULCERS  *URINARY PROBLEMS  *BOWEL PROBLEMS  UNUSUAL RASH Items with * indicate a potential emergency and should be followed up as soon as possible. If you have an emergency after office hours please contact your primary care physician or go to the nearest emergency department.  Please call the clinic during office hours if you have any questions or concerns.   You may also contact the Patient Navigator at [phone] should you have any questions or need assistance in obtaining follow up care.      Resources For Cancer Patients and their Caregivers ? American Cancer Society: Can assist with transportation, wigs, general needs, runs Look Good Feel Better.        1-888-227-6333 ? Cancer Care: Provides financial assistance, online support groups, medication/co-pay assistance.  1-800-813-HOPE (4673) ? [facility] Assists [place] cancer patients and their families through emotional , educational and financial support.  336-427-4357 ? [place] DSS Where to apply for food stamps, Medicaid and utility assistance. 336-342-1394 ? RCATS: Transportation to medical appointments. 336-347-2287 ? Social Security Administration: May apply for disability if have a Stage IV cancer. 336-342-7796 1-800-772-1213 ? [place] Aging, Disability and Transit Services: Assists with nutrition, care and transit needs. 336-349-2343         

## 2016-01-26 ENCOUNTER — Encounter (HOSPITAL_BASED_OUTPATIENT_CLINIC_OR_DEPARTMENT_OTHER): Payer: Commercial Managed Care - HMO

## 2016-01-26 ENCOUNTER — Inpatient Hospital Stay (HOSPITAL_COMMUNITY): Payer: Commercial Managed Care - HMO

## 2016-01-26 VITALS — BP 159/61 | HR 59 | Temp 97.4°F | Resp 18 | Wt 124.0 lb

## 2016-01-26 DIAGNOSIS — C3491 Malignant neoplasm of unspecified part of right bronchus or lung: Secondary | ICD-10-CM

## 2016-01-26 DIAGNOSIS — C3411 Malignant neoplasm of upper lobe, right bronchus or lung: Secondary | ICD-10-CM | POA: Diagnosis not present

## 2016-01-26 DIAGNOSIS — Z5111 Encounter for antineoplastic chemotherapy: Secondary | ICD-10-CM | POA: Diagnosis not present

## 2016-01-26 DIAGNOSIS — Z510 Encounter for antineoplastic radiation therapy: Secondary | ICD-10-CM | POA: Diagnosis not present

## 2016-01-26 MED ORDER — HEPARIN SOD (PORK) LOCK FLUSH 100 UNIT/ML IV SOLN
500.0000 [IU] | Freq: Once | INTRAVENOUS | Status: AC | PRN
  Administered 2016-01-26: 500 [IU]

## 2016-01-26 MED ORDER — HEPARIN SOD (PORK) LOCK FLUSH 100 UNIT/ML IV SOLN
INTRAVENOUS | Status: AC
  Filled 2016-01-26: qty 5

## 2016-01-26 MED ORDER — SODIUM CHLORIDE 0.9% FLUSH: 10.0000 mL | INTRAVENOUS | Status: DC | PRN

## 2016-01-26 MED ORDER — SODIUM CHLORIDE 0.9 % IV SOLN
Freq: Once | INTRAVENOUS | Status: AC
  Administered 2016-01-26: 10:00:00 via INTRAVENOUS

## 2016-01-26 MED ORDER — SODIUM CHLORIDE 0.9 % IV SOLN
100.0000 mg/m2 | Freq: Once | INTRAVENOUS | Status: AC
  Administered 2016-01-26: 160 mg via INTRAVENOUS
  Filled 2016-01-26: qty 8

## 2016-01-26 MED ORDER — PEGFILGRASTIM 6 MG/0.6ML ~~LOC~~ PSKT
PREFILLED_SYRINGE | SUBCUTANEOUS | Status: AC
  Filled 2016-01-26: qty 0.6

## 2016-01-26 MED ORDER — PEGFILGRASTIM 6 MG/0.6ML ~~LOC~~ PSKT
6.0000 mg | PREFILLED_SYRINGE | Freq: Once | SUBCUTANEOUS | Status: AC
  Administered 2016-01-26: 6 mg via SUBCUTANEOUS

## 2016-01-26 MED ORDER — SODIUM CHLORIDE 0.9 % IV SOLN
10.0000 mg | Freq: Once | INTRAVENOUS | Status: AC
  Administered 2016-01-26: 10 mg via INTRAVENOUS
  Filled 2016-01-26: qty 1

## 2016-01-26 NOTE — Progress Notes (Signed)
Tolerated VP16 without any problems. Neulasta on pro applied to pt's rt arm.

## 2016-01-29 DIAGNOSIS — C3411 Malignant neoplasm of upper lobe, right bronchus or lung: Secondary | ICD-10-CM | POA: Diagnosis not present

## 2016-01-29 DIAGNOSIS — Z510 Encounter for antineoplastic radiation therapy: Secondary | ICD-10-CM | POA: Diagnosis not present

## 2016-01-30 DIAGNOSIS — C3411 Malignant neoplasm of upper lobe, right bronchus or lung: Secondary | ICD-10-CM | POA: Diagnosis not present

## 2016-01-30 DIAGNOSIS — Z510 Encounter for antineoplastic radiation therapy: Secondary | ICD-10-CM | POA: Diagnosis not present

## 2016-01-31 ENCOUNTER — Emergency Department (HOSPITAL_COMMUNITY): Payer: Commercial Managed Care - HMO

## 2016-01-31 ENCOUNTER — Encounter (HOSPITAL_COMMUNITY): Payer: Self-pay | Admitting: *Deleted

## 2016-01-31 ENCOUNTER — Emergency Department (HOSPITAL_COMMUNITY)
Admission: EM | Admit: 2016-01-31 | Discharge: 2016-01-31 | Disposition: A | Discharge status: Home / Self Care | Payer: Commercial Managed Care - HMO | Attending: Emergency Medicine | Admitting: Emergency Medicine

## 2016-01-31 DIAGNOSIS — E785 Hyperlipidemia, unspecified: Secondary | ICD-10-CM | POA: Insufficient documentation

## 2016-01-31 DIAGNOSIS — Z79899 Other long term (current) drug therapy: Secondary | ICD-10-CM | POA: Diagnosis not present

## 2016-01-31 DIAGNOSIS — C3491 Malignant neoplasm of unspecified part of right bronchus or lung: Secondary | ICD-10-CM | POA: Diagnosis not present

## 2016-01-31 DIAGNOSIS — F17210 Nicotine dependence, cigarettes, uncomplicated: Secondary | ICD-10-CM | POA: Insufficient documentation

## 2016-01-31 DIAGNOSIS — C3411 Malignant neoplasm of upper lobe, right bronchus or lung: Secondary | ICD-10-CM | POA: Diagnosis not present

## 2016-01-31 DIAGNOSIS — R6883 Chills (without fever): Secondary | ICD-10-CM | POA: Diagnosis not present

## 2016-01-31 DIAGNOSIS — C3490 Malignant neoplasm of unspecified part of unspecified bronchus or lung: Secondary | ICD-10-CM | POA: Diagnosis not present

## 2016-01-31 DIAGNOSIS — R110 Nausea: Secondary | ICD-10-CM | POA: Diagnosis not present

## 2016-01-31 DIAGNOSIS — R531 Weakness: Secondary | ICD-10-CM | POA: Diagnosis present

## 2016-01-31 DIAGNOSIS — I951 Orthostatic hypotension: Secondary | ICD-10-CM | POA: Diagnosis not present

## 2016-01-31 DIAGNOSIS — R112 Nausea with vomiting, unspecified: Secondary | ICD-10-CM | POA: Diagnosis not present

## 2016-01-31 DIAGNOSIS — R42 Dizziness and giddiness: Secondary | ICD-10-CM | POA: Diagnosis not present

## 2016-01-31 DIAGNOSIS — W888XXA Exposure to other ionizing radiation, initial encounter: Secondary | ICD-10-CM

## 2016-01-31 DIAGNOSIS — R404 Transient alteration of awareness: Secondary | ICD-10-CM | POA: Diagnosis not present

## 2016-01-31 DIAGNOSIS — E860 Dehydration: Secondary | ICD-10-CM | POA: Insufficient documentation

## 2016-01-31 DIAGNOSIS — Z510 Encounter for antineoplastic radiation therapy: Secondary | ICD-10-CM | POA: Diagnosis not present

## 2016-01-31 HISTORY — DX: Malignant (primary) neoplasm, unspecified: C80.1

## 2016-01-31 LAB — CBC
HCT: 25 % — ABNORMAL LOW (ref 36.0–46.0)
HEMOGLOBIN: 7.9 g/dL — AB (ref 12.0–15.0)
MCH: 25 pg — AB (ref 26.0–34.0)
MCHC: 31.6 g/dL (ref 30.0–36.0)
MCV: 79.1 fL (ref 78.0–100.0)
Platelets: 88 10*3/uL — ABNORMAL LOW (ref 150–400)
RBC: 3.16 MIL/uL — AB (ref 3.87–5.11)
RDW: 19.6 % — ABNORMAL HIGH (ref 11.5–15.5)
WBC: 3.3 10*3/uL — ABNORMAL LOW (ref 4.0–10.5)

## 2016-01-31 LAB — URINALYSIS, ROUTINE W REFLEX MICROSCOPIC
BILIRUBIN URINE: NEGATIVE
GLUCOSE, UA: NEGATIVE mg/dL
HGB URINE DIPSTICK: NEGATIVE
Ketones, ur: NEGATIVE mg/dL
Leukocytes, UA: NEGATIVE
Nitrite: NEGATIVE
PH: 5.5 (ref 5.0–8.0)
Protein, ur: NEGATIVE mg/dL
SPECIFIC GRAVITY, URINE: 1.025 (ref 1.005–1.030)

## 2016-01-31 LAB — TROPONIN I: Troponin I: <0.03 ng/mL (ref <0.03)

## 2016-01-31 LAB — BASIC METABOLIC PANEL
ANION GAP: 8 (ref 5–15)
BUN: 30 mg/dL — ABNORMAL HIGH (ref 6–20)
CALCIUM: 8 mg/dL — AB (ref 8.9–10.3)
CHLORIDE: 102 mmol/L (ref 101–111)
CO2: 25 mmol/L (ref 22–32)
CREATININE: 1.28 mg/dL — AB (ref 0.44–1.00)
GFR calc non Af Amer: 39 mL/min — ABNORMAL LOW (ref >60)
GFR, EST AFRICAN AMERICAN: 46 mL/min — AB (ref >60)
GLUCOSE: 123 mg/dL — AB (ref 65–99)
Potassium: 3.9 mmol/L (ref 3.5–5.1)
Sodium: 135 mmol/L (ref 135–145)

## 2016-01-31 MED ORDER — SODIUM CHLORIDE 0.9 % IV BOLUS (SEPSIS)
1000.0000 mL | Freq: Once | INTRAVENOUS | Status: AC
  Administered 2016-01-31: 1000 mL via INTRAVENOUS

## 2016-01-31 NOTE — ED Notes (Signed)
Lab at bedside

## 2016-01-31 NOTE — ED Provider Notes (Signed)
CSN: [id number]     Arrival date & time 01/31/16  1123 History  By signing my name below, I, Rayna Sexton, attest that this documentation has been prepared under the direction and in the presence of Isla Pence, MD. Electronically Signed: Rayna Sexton, ED Scribe. 01/31/2016. 11:34 AM.   Chief Complaint  Patient presents with  . Weakness   The history is provided by the patient. No language interpreter was used.    HPI Comments: Janet Fletcher is a 80 y.o. female with a PMHx of lung cancer who presents to the Emergency Department by ambulance due to generalized weakness onset this morning. Pt was receiving chemotherapy this morning and on the way home began feeling extremely weak. She went to an UC and was then brought over via EMS. She reports 1x vomiting this morning. She denies any current pain or any other complaints at this time.    Past Medical History  Diagnosis Date  . Hyperlipidemia   . Allergy   . Osteopenia   . Low vitamin B12 level 12/15/2015  . Iron deficiency anemia 12/15/2015  . Cancer ([place])     lung   Past Surgical History  Procedure Laterality Date  . Small intestine surgery    . Cataract extraction, bilateral Bilateral   . Minor excision ear canal cyst Left     30+ years ago  . Video bronchoscopy with endobronchial ultrasound N/A 12/11/2015    Procedure: VIDEO BRONCHOSCOPY WITH ENDOBRONCHIAL ULTRASOUND;  Surgeon: Melrose Nakayama, MD;  Location: [facility] OR;  Service: Thoracic;  Laterality: N/A;   Family History  Problem Relation Age of Onset  . Cancer Mother   . Hypertension Father    Social History  Substance Use Topics  . Smoking status: Current Some Day Smoker -- 0.25 packs/day for 50 years  . Smokeless tobacco: Never Used  . Alcohol Use: No   OB History    No data available     Review of Systems  Constitutional: Positive for fatigue.  Gastrointestinal: Positive for nausea and vomiting.  Neurological: Positive for weakness.  All other  systems reviewed and are negative.   Allergies  Acetaminophen; Flagyl; and Penicillins  Home Medications   Prior to Admission medications   Medication Sig Start Date End Date Taking? Authorizing Provider  simvastatin (ZOCOR) 40 MG tablet Take 1 tablet (40 mg total) by mouth at bedtime. 12/20/14  Yes Mary-Margaret Hassell Done, FNP  CISPLATIN IV Inject into the vein. Day 1 every 21 days (to begin 12/13/15)    Historical Provider, MD  ETOPOSIDE IV Inject into the vein. Days 1-3 every 21 days (to begin 12/13/15)    Historical Provider, MD  lidocaine-prilocaine (EMLA) cream Apply a quarter size amount to port site 1 hour prior to chemo. Do not rub in. Cover with plastic wrap. 12/11/15   Patrici Ranks, MD  ondansetron (ZOFRAN) 8 MG tablet Take 1 tablet (8 mg total) by mouth every 8 (eight) hours as needed for nausea or vomiting. 12/11/15   Patrici Ranks, MD  Pegfilgrastim (NEULASTA ONPRO [place]) Inject into the skin. To be administered 27 hours after the completion of chemo.    Historical Provider, MD  Polysacchar Iron-FA-B12 (FERREX 150 FORTE) 150-1-25 MG-MG-MCG CAPS Take 1 capsule by mouth daily. Patient not taking: Reported on 01/31/2016 12/15/15   [facility], PA-C  prochlorperazine (COMPAZINE) 10 MG tablet Take 1 tablet (10 mg total) by mouth every 6 (six) hours as needed for nausea or vomiting. 12/11/15  Patrici Ranks, MD   BP 146/67 mmHg  Pulse 78  Temp(Src) 98.1 F (36.7 C) (Oral)  Resp 16  Ht '5\' 4"'$  (1.626 m)  Wt 120 lb (54.432 kg)  BMI 20.59 kg/m2  SpO2 98%    Physical Exam  Constitutional: She is oriented to person, place, and time. She appears well-developed and well-nourished.  HENT:  Head: Normocephalic and atraumatic.  Eyes: EOM are normal.  Neck: Normal range of motion.  Cardiovascular: Normal rate.   Pulmonary/Chest: Effort normal. No respiratory distress.  Abdominal: Soft.  Musculoskeletal: Normal range of motion.  Neurological: She is alert and oriented to  person, place, and time.  Skin: Skin is warm and dry.  Psychiatric: She has a normal mood and affect.  Nursing note and vitals reviewed.   ED Course  Procedures  DIAGNOSTIC STUDIES: Oxygen Saturation is 97% on RA, normal by my interpretation.    COORDINATION OF CARE: 11:32 AM Discussed next steps with pt. Pt verbalized understanding and is agreeable with the plan.   Labs Review Labs Reviewed  BASIC METABOLIC PANEL - Abnormal; Notable for the following:    Glucose, Bld 123 (*)    BUN 30 (*)    Creatinine, Ser 1.28 (*)    Calcium 8.0 (*)    GFR calc non Af Amer 39 (*)    GFR calc Af Amer 46 (*)    All other components within normal limits  CBC - Abnormal; Notable for the following:    WBC 3.3 (*)    RBC 3.16 (*)    Hemoglobin 7.9 (*)    HCT 25.0 (*)    MCH 25.0 (*)    RDW 19.6 (*)    Platelets 88 (*)    All other components within normal limits  URINALYSIS, ROUTINE W REFLEX MICROSCOPIC (NOT AT [facility])  TROPONIN I    Imaging Review Dg Chest 2 View  01/31/2016  CLINICAL DATA:  Lung cancer with generalized weakness. EXAM: CHEST  2 VIEW COMPARISON:  12/11/2015 FINDINGS: The lungs are clear wiithout focal pneumonia, edema, pneumothorax or pleural effusion. Hyperexpansion is consistent with emphysema. Symmetric nodular densities projecting over each lower lung are compatible with nipple shadows. The cardiopericardial silhouette is within normal limits for size. The visualized bony structures of the thorax are intact. Telemetry leads overlie the chest. IMPRESSION: No active cardiopulmonary disease. Electronically Signed   By: Misty Stanley M.D.   On: 01/31/2016 12:53   I have personally reviewed and evaluated these images and lab results as part of my medical decision-making.   EKG Interpretation   Date/Time:  Wednesday January 31 2016 11:30:17 EDT Ventricular Rate:  80 PR Interval:    QRS Duration: 115 QT Interval:  388 QTC Calculation: 448 R Axis:   77 Text Interpretation:   Normal sinus rhythm Nonspecific intraventricular  conduction delay ST elevation, consider inferior injury Baseline wander in  lead(s) V3 Confirmed by Ludwika Rodd MD, Carlyon Nolasco ([id number]) on 01/31/2016 12:47:13  PM      MDM  Pt is feeling much better.  She knows to drink a lot of fluids.  She has an appt with Dr. Whitney Muse on 7/14.  She is instructed to return if worse and to keep her appt.  Final diagnoses:  Small cell carcinoma of right lung ([place])  Dehydration due to radiation   I personally performed the services described in this documentation, which was scribed in my presence. The recorded information has been reviewed and is accurate.   Isla Pence, MD  02/01/16 0658 

## 2016-01-31 NOTE — ED Notes (Signed)
Pt on bedside commode.

## 2016-01-31 NOTE — ED Notes (Signed)
MD at bedside. 

## 2016-01-31 NOTE — ED Notes (Signed)
Pt given water. Pt going to attempt to give urine specimen.

## 2016-01-31 NOTE — ED Notes (Signed)
Pt comes in by EMS from the urgent care in [place]. Pt states she got a chemo treatment this morning and wasn't feeling good so she went to the urgent care. Pt was brought here. She states she feels weak all over, denies any specific locations. Pt states she had one episode of vomiting this morning.

## 2016-01-31 NOTE — ED Notes (Signed)
Pt placed on the bedside commode.

## 2016-01-31 NOTE — ED Notes (Signed)
Patient will call when she can provide sample

## 2016-01-31 NOTE — ED Notes (Signed)
Urine sample received from pt.

## 2016-02-01 DIAGNOSIS — Z510 Encounter for antineoplastic radiation therapy: Secondary | ICD-10-CM | POA: Diagnosis not present

## 2016-02-01 DIAGNOSIS — C3411 Malignant neoplasm of upper lobe, right bronchus or lung: Secondary | ICD-10-CM | POA: Diagnosis not present

## 2016-02-02 ENCOUNTER — Encounter (HOSPITAL_COMMUNITY): Payer: Commercial Managed Care - HMO

## 2016-02-02 ENCOUNTER — Encounter (HOSPITAL_BASED_OUTPATIENT_CLINIC_OR_DEPARTMENT_OTHER): Payer: Commercial Managed Care - HMO | Admitting: Hematology & Oncology

## 2016-02-02 ENCOUNTER — Encounter (HOSPITAL_COMMUNITY): Payer: Self-pay | Admitting: Hematology & Oncology

## 2016-02-02 ENCOUNTER — Other Ambulatory Visit: Payer: Self-pay | Admitting: Nurse Practitioner

## 2016-02-02 VITALS — BP 98/52 | HR 94 | Temp 97.9°F | Resp 16 | Wt 118.8 lb

## 2016-02-02 DIAGNOSIS — C3411 Malignant neoplasm of upper lobe, right bronchus or lung: Secondary | ICD-10-CM

## 2016-02-02 DIAGNOSIS — M8580 Other specified disorders of bone density and structure, unspecified site: Secondary | ICD-10-CM | POA: Diagnosis not present

## 2016-02-02 DIAGNOSIS — D61810 Antineoplastic chemotherapy induced pancytopenia: Secondary | ICD-10-CM | POA: Diagnosis not present

## 2016-02-02 DIAGNOSIS — D649 Anemia, unspecified: Secondary | ICD-10-CM | POA: Diagnosis not present

## 2016-02-02 DIAGNOSIS — E538 Deficiency of other specified B group vitamins: Secondary | ICD-10-CM

## 2016-02-02 DIAGNOSIS — K5900 Constipation, unspecified: Secondary | ICD-10-CM

## 2016-02-02 DIAGNOSIS — R0609 Other forms of dyspnea: Secondary | ICD-10-CM

## 2016-02-02 DIAGNOSIS — C3490 Malignant neoplasm of unspecified part of unspecified bronchus or lung: Secondary | ICD-10-CM | POA: Diagnosis not present

## 2016-02-02 DIAGNOSIS — C3491 Malignant neoplasm of unspecified part of right bronchus or lung: Secondary | ICD-10-CM

## 2016-02-02 DIAGNOSIS — F17210 Nicotine dependence, cigarettes, uncomplicated: Secondary | ICD-10-CM | POA: Diagnosis not present

## 2016-02-02 DIAGNOSIS — Z720 Tobacco use: Secondary | ICD-10-CM

## 2016-02-02 DIAGNOSIS — Z9889 Other specified postprocedural states: Secondary | ICD-10-CM | POA: Diagnosis not present

## 2016-02-02 DIAGNOSIS — R197 Diarrhea, unspecified: Secondary | ICD-10-CM

## 2016-02-02 DIAGNOSIS — R5383 Other fatigue: Secondary | ICD-10-CM

## 2016-02-02 DIAGNOSIS — Z510 Encounter for antineoplastic radiation therapy: Secondary | ICD-10-CM | POA: Diagnosis not present

## 2016-02-02 DIAGNOSIS — E785 Hyperlipidemia, unspecified: Secondary | ICD-10-CM | POA: Diagnosis not present

## 2016-02-02 DIAGNOSIS — Z888 Allergy status to other drugs, medicaments and biological substances status: Secondary | ICD-10-CM | POA: Diagnosis not present

## 2016-02-02 LAB — COMPREHENSIVE METABOLIC PANEL
ALK PHOS: 59 U/L (ref 38–126)
ALT: 10 U/L — AB (ref 14–54)
AST: 18 U/L (ref 15–41)
Albumin: 3.2 g/dL — ABNORMAL LOW (ref 3.5–5.0)
Anion gap: 9 (ref 5–15)
BILIRUBIN TOTAL: 0.8 mg/dL (ref 0.3–1.2)
BUN: 22 mg/dL — ABNORMAL HIGH (ref 6–20)
CALCIUM: 8.3 mg/dL — AB (ref 8.9–10.3)
CHLORIDE: 103 mmol/L (ref 101–111)
CO2: 24 mmol/L (ref 22–32)
CREATININE: 1.19 mg/dL — AB (ref 0.44–1.00)
GFR, EST AFRICAN AMERICAN: 50 mL/min — AB (ref >60)
GFR, EST NON AFRICAN AMERICAN: 43 mL/min — AB (ref >60)
Glucose, Bld: 118 mg/dL — ABNORMAL HIGH (ref 65–99)
Potassium: 3.4 mmol/L — ABNORMAL LOW (ref 3.5–5.1)
Sodium: 136 mmol/L (ref 135–145)
TOTAL PROTEIN: 5.9 g/dL — AB (ref 6.5–8.1)

## 2016-02-02 LAB — CBC WITH DIFFERENTIAL/PLATELET
Basophils Absolute: 0 10*3/uL (ref 0.0–0.1)
Basophils Relative: 6 %
EOS PCT: 13 %
Eosinophils Absolute: 0 10*3/uL (ref 0.0–0.7)
HEMATOCRIT: 23.6 % — AB (ref 36.0–46.0)
Hemoglobin: 7.6 g/dL — ABNORMAL LOW (ref 12.0–15.0)
LYMPHS ABS: 0.1 10*3/uL — AB (ref 0.7–4.0)
LYMPHS PCT: 50 %
MCH: 25.2 pg — AB (ref 26.0–34.0)
MCHC: 32.2 g/dL (ref 30.0–36.0)
MCV: 78.1 fL (ref 78.0–100.0)
Monocytes Absolute: 0 10*3/uL — ABNORMAL LOW (ref 0.1–1.0)
Monocytes Relative: 6 %
Neutro Abs: 0 10*3/uL — ABNORMAL LOW (ref 1.7–7.7)
Neutrophils Relative %: 25 %
PLATELETS: 40 10*3/uL — AB (ref 150–400)
RBC: 3.02 MIL/uL — AB (ref 3.87–5.11)
RDW: 18.8 % — ABNORMAL HIGH (ref 11.5–15.5)
WBC: 0.2 10*3/uL — AB (ref 4.0–10.5)

## 2016-02-02 LAB — MAGNESIUM: MAGNESIUM: 1.3 mg/dL — AB (ref 1.7–2.4)

## 2016-02-02 MED ORDER — CYANOCOBALAMIN 1000 MCG/ML IJ SOLN: 1000.0000 ug | Freq: Once | INTRAMUSCULAR | Status: DC

## 2016-02-02 MED ORDER — CYANOCOBALAMIN 1000 MCG/ML IJ SOLN
INTRAMUSCULAR | Status: AC
  Filled 2016-02-02: qty 1

## 2016-02-02 MED ORDER — MAGNESIUM CHLORIDE 64 MG PO TBEC: 1.0000 | DELAYED_RELEASE_TABLET | Freq: Three times a day (TID) | ORAL | Status: DC

## 2016-02-02 MED ORDER — OMEPRAZOLE 40 MG PO CPDR: 40.0000 mg | DELAYED_RELEASE_CAPSULE | Freq: Every day | ORAL | Status: DC

## 2016-02-02 NOTE — Patient Instructions (Addendum)
[place] at [facility] Discharge Instructions  RECOMMENDATIONS MADE BY THE CONSULTANT AND ANY TEST RESULTS WILL BE SENT TO YOUR REFERRING PHYSICIAN.  You were seen by Dr. Whitney Muse today. You are to receive 2 units of blood on Tuesday Labs on Monday Hold B12 today  See Dr. Mamie Nick next week  [place] instructions - wear nausea patch for 7 days. Remove patch after 7 days and put a new one on. Constipation sheet given Please call the center with any concerns  Thank you for choosing [place] at [facility] to provide your oncology and hematology care.  To afford each patient quality time with our provider, please arrive at least 15 minutes before your scheduled appointment time.   Beginning January 23rd 2017 lab work for the [facility] will be done in the  Main lab at [facility] on 1st floor. If you have a lab appointment with the [facility] please come in thru the  Main Entrance and check in at the main information desk  You need to re-schedule your appointment should you arrive 10 or more minutes late.  We strive to give you quality time with our providers, and arriving late affects you and other patients whose appointments are after yours.  Also, if you no show three or more times for appointments you may be dismissed from the clinic at the providers discretion.     Again, thank you for choosing [facility].  Our hope is that these requests will decrease the amount of time that you wait before being seen by our physicians.       _____________________________________________________________  Should you have questions after your visit to [facility], please contact our office at [phone] between the hours of 8:30 a.m. and 4:30 p.m.  Voicemails left after 4:30 p.m. will not be returned until the following business day.  For prescription refill requests, have your pharmacy contact our office.         Resources  For Cancer Patients and their Caregivers ? American Cancer Society: Can assist with transportation, wigs, general needs, runs Look Good Feel Better.        907-474-9237 ? Cancer Care: Provides financial assistance, online support groups, medication/co-pay assistance.  1-800-813-HOPE (334)783-6014) ? [place] Assists [place] cancer patients and their families through emotional , educational and financial support.  442-765-6104 ? [place] DSS Where to apply for food stamps, Medicaid and utility assistance. 438-519-3931 ? RCATS: Transportation to medical appointments. 979-056-6745 ? Social Security Administration: May apply for disability if have a Stage IV cancer. (819)732-9500 (986)169-6784 ? [employer], Disability and Transit Services: Assists with nutrition, care and transit needs. [place] Support Programs: '@10RELATIVEDAYS'$ @ > Cancer Support Group  2nd Tuesday of the month 1pm-2pm, Journey Room  > Creative Journey  3rd Tuesday of the month 1130am-1pm, Journey Room  > Look Good Feel Better  1st Wednesday of the month 10am-12 noon, Journey Room (Call [place] to register [phone])

## 2016-02-02 NOTE — Progress Notes (Signed)
CRITICAL VALUE ALERT Critical value received:  WBC 0.2 Date of notification:  02/02/16 Time of notification: 3704 Critical value read back:  Yes.   Nurse who received alert:  Isidoro Donning RN Dr. Whitney Muse notified.

## 2016-02-02 NOTE — Progress Notes (Signed)
No B12 today per Dr Whitney Muse

## 2016-02-02 NOTE — Progress Notes (Signed)
[place] at [place] NOTE  Patient Care Team: Chevis Pretty, FNP as PCP - General (Nurse Practitioner)  CHIEF COMPLAINTS/PURPOSE OF CONSULTATION:  Limited stage small cell lung cancer    Small cell carcinoma of right lung ([place])   11/28/2015 Imaging Large anterior mediastinal mass in continuity with a R hilar mass, narrowing of SVC, abnormal densities in RUL, 9 mm spiculated nodule, nonspec. hypodensity in liver   12/05/2015 PET scan Large hypermetabolic paratracheal mass c/w SCLC, perihilar nodular densities in RML, mild metabolic activity RUL nodule, no distant metastatic disease   12/11/2015 Procedure Video bronch with biopsies and brushings, endobronchial ultrasound with mediastinal LN aspiration. Dr. Roxan Hockey   12/11/2015 Pathology Results Trachea biopsy negative, FNA RUL malignant cells c/w SCLC   12/13/2015 -  Chemotherapy Cisplatin/Etoposide   12/25/2015 Imaging MRI brain- No intracranial parenchymal enhancing lesion. Tiny right frontal calvarial enhancing lesion. Small metastatic lesion not excluded although this may represent an incidentally detected benign process.   12/29/2015 -  Radiation Therapy XRT     HISTORY OF PRESENTING ILLNESS:  Janet Fletcher 80 y.o. female is here for ongoing follow-up of limited stage SCLC.  Janet Fletcher returns to the [facility] today accompanied by 3 female family members (daughters). She notes severe fatigue today.   Her family members wonder if her nadir and everything is why she's cold, and if that's what's keeping her blood pressure the way it is.  Janet Fletcher says she's fine, but her family members note "she acts like she's just down and out." They say "if she feels awful, she won't tell us." They say that, if they ask her how she feels, she always says "I feel fine." They note that she always has lots of company, and they keep telling her she needs rest.   She was shaking with chills the other day, and  ended up here; "because I was constipated terrible." She says she's taking something for nausea and vomiting, and was reminded that the nausea medicine can make her constipated. She was brought to the ED on 7/12 and hydrated.   Her daughters don't think that they have a constipation sheet for reference.  They remark that she has a week and two days left of radiation. They feel that radiation is beginning to wear her out, or maybe it is just a combination of all her therapy.  Janet Fletcher denies any sore throat.  Her daughters say that they communicate with their father a lot and that she barely rests. Her daughter remarks "she says that we get on her nerves," and her other daughter says "it's just tough love." One of her daughters adds that "she says she doesn't like the Ensure." Janet Fletcher hasn't tried Carnation instant breakfast, and was advised to try that. They note "she doesn't have taste for nothing when she eats." Janet Fletcher says "I feel like I'm losing my tastebuds." Regarding her chemo and radiation, her daughter finally remarks, "it's took a toll on her, but she ain't going to admit that it has."  Janet Fletcher comments "when I start eating, I get nauseated." she notes that, when she goes to the bathroom "it's like water; slimy stuff." She says "it looks like pee but it's real thick." She notes that it happens twice a day. She denies any pain or cramping with these stools. She notes that this has been going on for approximately three days.   MEDICAL HISTORY:  Past Medical History  Diagnosis  Date  . Hyperlipidemia   . Allergy   . Osteopenia   . Low vitamin B12 level 12/15/2015  . Iron deficiency anemia 12/15/2015  . Cancer ([place])     lung    SURGICAL HISTORY: Past Surgical History  Procedure Laterality Date  . Small intestine surgery    . Cataract extraction, bilateral Bilateral   . Minor excision ear canal cyst Left     30+ years ago  . Video bronchoscopy with endobronchial  ultrasound N/A 12/11/2015    Procedure: VIDEO BRONCHOSCOPY WITH ENDOBRONCHIAL ULTRASOUND;  Surgeon: Melrose Nakayama, MD;  Location: [facility];  Service: Thoracic;  Laterality: N/A;    SOCIAL HISTORY: Social History   Social History  . Marital Status: Married    Spouse Name: N/A  . Number of Children: N/A  . Years of Education: N/A   Occupational History  . Not on file.   Social History Main Topics  . Smoking status: Current Some Day Smoker -- 0.25 packs/day for 50 years  . Smokeless tobacco: Never Used  . Alcohol Use: No  . Drug Use: No  . Sexual Activity: Not on file   Other Topics Concern  . Not on file   Social History Narrative  Married; 28 years 5 daughters 2 sons 12 grandchildren, 8 great grandchildren Smokes now but not as much; started long time ago. 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 Cancer Mother   . Hypertension Father   Mother died at 14 years old. Father died at 91 years old. 7 sisters and 7 brothers 96 living Older sister died of leukemia.   ALLERGIES:  is allergic to acetaminophen; flagyl; and penicillins.  MEDICATIONS:  Current Outpatient Prescriptions  Medication Sig Dispense Refill  . CISPLATIN IV Inject into the vein. Day 1 every 21 days (to begin 12/13/15)    . ETOPOSIDE IV Inject into the vein. Days 1-3 every 21 days (to begin 12/13/15)    . lidocaine-prilocaine (EMLA) cream Apply a quarter size amount to port site 1 hour prior to chemo. Do not rub in. Cover with plastic wrap. 30 g 3  . ondansetron (ZOFRAN) 8 MG tablet Take 1 tablet (8 mg total) by mouth every 8 (eight) hours as needed for nausea or vomiting. 30 tablet 2  . Pegfilgrastim (NEULASTA ONPRO [place]) Inject into the skin. To be administered 27 hours after the completion of chemo.    . Polysacchar Iron-FA-B12 (FERREX 150 FORTE)  150-1-25 MG-MG-MCG CAPS Take 1 capsule by mouth daily. 30 capsule 5  . prochlorperazine (COMPAZINE) 10 MG tablet Take 1 tablet (10 mg total) by mouth every 6 (six) hours as needed for nausea or vomiting. 30 tablet 2  . simvastatin (ZOCOR) 40 MG tablet Take 1 tablet (40 mg total) by mouth at bedtime. 90 tablet 3  . omeprazole (PRILOSEC) 40 MG capsule Take 1 capsule (40 mg total) by mouth daily. 30 capsule 3   No current facility-administered medications for this visit.   Facility-Administered Medications Ordered in Other Visits  Medication Dose Route Frequency Provider Last Rate Last Dose  . cyanocobalamin ((VITAMIN B-12)) injection 1,000 mcg  1,000 mcg Intramuscular Once Baird Cancer, PA-C        Review of Systems  Constitutional: Positive for weight loss. Negative for fever, chills and malaise/fatigue.       Appetite loss.  HENT: Negative.  Negative for congestion, hearing loss, nosebleeds, sore throat and tinnitus.   Eyes: Negative.  Negative for blurred vision, double vision, pain and discharge.  Respiratory: Positive for cough. Negative for hemoptysis, sputum production, shortness of breath and wheezing.   Cardiovascular: Negative.  Negative for chest pain, palpitations, claudication, leg swelling and PND.  Gastrointestinal: Positive for diarrhea X 1 week.  Negative for heartburn, nausea, vomiting, abdominal pain,  constipation, blood in stool and melena.  Genitourinary: Negative.  Negative for dysuria, urgency, frequency and hematuria.  Musculoskeletal: Negative.  Negative for myalgias, joint pain and falls.  Skin: Negative.  Negative for itching and rash.  Neurological: Negative.  Negative for dizziness, tingling, tremors, sensory change, speech change, focal weakness, seizures, loss of consciousness, weakness and headaches.  Endo/Heme/Allergies: Negative.  Does not bruise/bleed easily.  Psychiatric/Behavioral: Negative.  Negative for depression, suicidal ideas, memory loss and  substance abuse. The patient is not nervous/anxious and does not have insomnia.   All other systems reviewed and are negative.  14 point ROS was done and is otherwise as detailed above or in HPI   PHYSICAL EXAMINATION: ECOG PERFORMANCE STATUS: 1 - Symptomatic but completely ambulatory  Vitals - 1 value per visit 0/81/4481  SYSTOLIC 98  DIASTOLIC 52  Pulse 94  Temperature 97.9  Respirations 16  Weight (lb) 118.8  Height   BMI 20.39   Physical Exam  Constitutional: She is oriented to person, place, and time and well-developed, well-nourished, appears fatigued. Wearing hair prosthesis HENT:  Head: Normocephalic and atraumatic.  Nose: Nose normal.  Mouth/Throat: Oropharynx is clear and moist. No oropharyngeal exudate.  Eyes: Conjunctivae and EOM are normal. Pupils are equal, round, and reactive to light. Right eye exhibits no discharge. Left eye exhibits no discharge. No scleral icterus.  Neck: Normal range of motion. Neck supple. No tracheal deviation present. No thyromegaly present.  Cardiovascular: Normal rate, regular rhythm and normal heart sounds.  Exam reveals no gallop and no friction rub.   No murmur heard. Pulmonary/Chest: Effort normal and breath sounds normal. She has no wheezes. She has no rales.  Breath sounds very clear. Abdominal: Soft. Bowel sounds are normal. She exhibits no distension and no mass. There is no tenderness. There is no rebound and no guarding.  Musculoskeletal: Normal range of motion. She exhibits no edema.  Lymphadenopathy:    She has no cervical adenopathy.  Neurological: She is alert and oriented to person, place, and time. She has normal reflexes. No cranial nerve deficit. Gait normal. Coordination normal.  Skin: Skin is warm and dry. No rash noted.  Psychiatric: Mood, memory, affect and judgment normal.  Nursing note and vitals reviewed.   LABORATORY DATA:  I have reviewed the data as listed  Results for JESSEY, HUYETT (MRN [id number]) as  of 02/02/2016 12:06  Ref. Range 02/02/2016 10:52  Sodium Latest Ref Range: 135-145 mmol/L 136  Potassium Latest Ref Range: 3.5-5.1 mmol/L 3.4 (L)  Chloride Latest Ref Range: 101-111 mmol/L 103  CO2 Latest Ref Range: 22-32 mmol/L 24  BUN Latest Ref Range: 6-20 mg/dL 22 (H)  Creatinine Latest Ref Range: 0.44-1.00 mg/dL 1.19 (H)  Calcium Latest Ref Range: 8.9-10.3 mg/dL 8.3 (L)  EGFR (Non-African Amer.) Latest Ref Range: >60 mL/min 43 (L)  EGFR (African American) Latest Ref Range: >60 mL/min 50 (L)  Glucose Latest Ref Range: 65-99 mg/dL 118 (H)  Anion gap Latest Ref Range: 5-15  9  Magnesium Latest Ref Range: 1.7-2.4 mg/dL 1.3 (L)  Alkaline Phosphatase Latest  Ref Range: 38-126 U/L 59  Albumin Latest Ref Range: 3.5-5.0 g/dL 3.2 (L)  AST Latest Ref Range: 15-41 U/L 18  ALT Latest Ref Range: 14-54 U/L 10 (L)  Total Protein Latest Ref Range: 6.5-8.1 g/dL 5.9 (L)  Total Bilirubin Latest Ref Range: 0.3-1.2 mg/dL 0.8  WBC Latest Ref Range: 4.0-10.5 K/uL 0.2 (LL)  RBC Latest Ref Range: 3.87-5.11 MIL/uL 3.02 (L)  Hemoglobin Latest Ref Range: 12.0-15.0 g/dL 7.6 (L)  HCT Latest Ref Range: 36.0-46.0 % 23.6 (L)  MCV Latest Ref Range: 78.0-100.0 fL 78.1  MCH Latest Ref Range: 26.0-34.0 pg 25.2 (L)  MCHC Latest Ref Range: 30.0-36.0 g/dL 32.2  RDW Latest Ref Range: 11.5-15.5 % 18.8 (H)  Platelets Latest Ref Range: 150-400 K/uL 40 (L)  Neutrophils Latest Units: % 25  Lymphocytes Latest Units: % 50  Monocytes Relative Latest Units: % 6  Eosinophil Latest Units: % 13  Basophil Latest Units: % 6  NEUT# Latest Ref Range: 1.7-7.7 K/uL 0.0 (L)  Lymphocyte # Latest Ref Range: 0.7-4.0 K/uL 0.1 (L)  Monocyte # Latest Ref Range: 0.1-1.0 K/uL 0.0 (L)  Eosinophils Absolute Latest Ref Range: 0.0-0.7 K/uL 0.0  Basophils Absolute Latest Ref Range: 0.0-0.1 K/uL 0.0    RADIOGRAPHIC STUDIES: I have personally reviewed the radiological images as listed and agreed with the findings in the report. Dg Chest 2  View  01/31/2016  CLINICAL DATA:  Lung cancer with generalized weakness. EXAM: CHEST  2 VIEW COMPARISON:  12/11/2015 FINDINGS: The lungs are clear wiithout focal pneumonia, edema, pneumothorax or pleural effusion. Hyperexpansion is consistent with emphysema. Symmetric nodular densities projecting over each lower lung are compatible with nipple shadows. The cardiopericardial silhouette is within normal limits for size. The visualized bony structures of the thorax are intact. Telemetry leads overlie the chest. IMPRESSION: No active cardiopulmonary disease. Electronically Signed   By: Misty Stanley M.D.   On: 01/31/2016 12:53     Study Result     CLINICAL DATA: Initial treatment strategy for mediastinal mass.  EXAM: NUCLEAR MEDICINE PET SKULL BASE TO THIGH  TECHNIQUE: Thirteen mCi F-18 FDG was injected intravenously. Full-ring PET imaging was performed from the skull base to thigh after the radiotracer. CT data was obtained and used for attenuation correction and anatomic localization.  FASTING BLOOD GLUCOSE: Value: 83 mg/dl  COMPARISON: CT 11/28/2015  FINDINGS: NECK  Hypermetabolic nodules in the central RIGHT upper lobe measuring approximately 1 cm each (image 99, series 3) with intense metabolic activity (SUV max 11.9). Small nodule in the RIGHT upper lobe medially measuring 10 mm on image 90, series 3 has mild metabolic activity.  The large RIGHT paratracheal mass measures 7 cm with intense peripheral metabolic activity with SUV max equal 12.9. This metabolic activity extends to the high RIGHT paratracheal location. Mass surrounds the SVC.  No LEFT lung hypermetabolic nodules.  CHEST  No hypermetabolic mediastinal or hilar nodes. No suspicious pulmonary nodules on the CT scan.  ABDOMEN/PELVIS  No abnormal hypermetabolic activity within the liver, pancreas, adrenal glands, or spleen. No hypermetabolic lymph nodes in the abdomen or pelvis. There is dense  sclerosis of the aorta which extends into the lumen. Uterus normal.  SKELETON  No focal hypermetabolic activity to suggest skeletal metastasis.  IMPRESSION: 1. Large intensely hypermetabolic paratracheal mass most consistent with metastatic small cell lung carcinoma. 2. Perihilar nodular densities in the RIGHT middle lobe are likely pulmonary metastasis. 3. Mild metabolic activity associated are RIGHT upper lobe nodule may represent primary lesion. 4. No evidence of  distant metastatic disease. 5. Dense sclerosis of the aorta is extends into the lumen of the infrarenal abdominal aorta LEFT common iliac artery.   Electronically Signed  By: Suzy Bouchard M.D.  On: 12/05/2015 17:19    PATHOLOGY    ASSESSMENT & PLAN:  Limited stage small cell lung cancer Tobacco Abuse Anemia Low B12 Iron deficiency Chemotherapy induced pancytopenia Symptomatic anemia Diarrhea/constipation Hypomagnesemia  We spent time first discussing neutropenia, neutropenic precautions. I advised her that once her white count recovers she will begin to feel better. Her fatigue is severe and she is SOB with exertion. She does not wish to be admitted for transfusion but agrees to transfusion of PRBC's tomorrow.  I may have to dose reduce her chemotherapy moving forward. CBC with differential will be rechecked in one week.  I am not sure if her current "diarrhea" is from what she was taking for her constipation. If symptoms persist over the next 24 hrs she will give [place] a stool sample.  She was given another constipation sheet on how to manage constipation moving forward.  Requested medications were refilled. [facility] uses [facility] [place]. She will start on SLO-MAG for her hypomagnesemia. If she cannot tolerate we can replace her IV tomorrow.   I will see her back next week.  All questions were answered. The family knows how to contact us with problems in the interim. She will return tomorrow  for transfusion.   Orders Placed This Encounter  Procedures  . Magnesium    Standing Status:   Future    Number of Occurrences:   1    Standing Expiration Date:   02/01/2017  . Type and screen    Standing Status:   Future    Number of Occurrences:   1    Standing Expiration Date:   02/03/2017    All questions were answered. The patient knows to call the clinic with any problems, questions or concerns.  This document serves as a record of services personally performed by Ancil Linsey, MD. It was created on her behalf by Toni Amend, a trained medical scribe. The creation of this record is based on the scribe's personal observations and the provider's statements to them. This document has been checked and approved by the attending provider.  I have reviewed the above documentation for accuracy and completeness, and I agree with the above.  This note was electronically signed.  Molli Hazard, MD  02/02/2016 12:12 PM

## 2016-02-05 ENCOUNTER — Other Ambulatory Visit (HOSPITAL_COMMUNITY): Payer: Self-pay | Admitting: Emergency Medicine

## 2016-02-05 ENCOUNTER — Encounter (HOSPITAL_COMMUNITY): Payer: Commercial Managed Care - HMO

## 2016-02-05 ENCOUNTER — Encounter (HOSPITAL_BASED_OUTPATIENT_CLINIC_OR_DEPARTMENT_OTHER): Payer: Commercial Managed Care - HMO

## 2016-02-05 ENCOUNTER — Encounter (HOSPITAL_COMMUNITY): Payer: Self-pay

## 2016-02-05 ENCOUNTER — Other Ambulatory Visit (HOSPITAL_COMMUNITY): Payer: Commercial Managed Care - HMO

## 2016-02-05 VITALS — BP 86/48 | HR 70 | Temp 98.2°F | Resp 16

## 2016-02-05 DIAGNOSIS — C3491 Malignant neoplasm of unspecified part of right bronchus or lung: Secondary | ICD-10-CM

## 2016-02-05 DIAGNOSIS — D649 Anemia, unspecified: Secondary | ICD-10-CM | POA: Diagnosis not present

## 2016-02-05 DIAGNOSIS — C3490 Malignant neoplasm of unspecified part of unspecified bronchus or lung: Secondary | ICD-10-CM | POA: Diagnosis not present

## 2016-02-05 DIAGNOSIS — E785 Hyperlipidemia, unspecified: Secondary | ICD-10-CM | POA: Diagnosis not present

## 2016-02-05 DIAGNOSIS — F17210 Nicotine dependence, cigarettes, uncomplicated: Secondary | ICD-10-CM | POA: Diagnosis not present

## 2016-02-05 DIAGNOSIS — M8580 Other specified disorders of bone density and structure, unspecified site: Secondary | ICD-10-CM | POA: Diagnosis not present

## 2016-02-05 DIAGNOSIS — Z9889 Other specified postprocedural states: Secondary | ICD-10-CM | POA: Diagnosis not present

## 2016-02-05 DIAGNOSIS — Z888 Allergy status to other drugs, medicaments and biological substances status: Secondary | ICD-10-CM | POA: Diagnosis not present

## 2016-02-05 DIAGNOSIS — D509 Iron deficiency anemia, unspecified: Secondary | ICD-10-CM

## 2016-02-05 DIAGNOSIS — Z510 Encounter for antineoplastic radiation therapy: Secondary | ICD-10-CM | POA: Diagnosis not present

## 2016-02-05 DIAGNOSIS — C3411 Malignant neoplasm of upper lobe, right bronchus or lung: Secondary | ICD-10-CM | POA: Diagnosis not present

## 2016-02-05 LAB — CBC WITH DIFFERENTIAL/PLATELET
BASOS ABS: 0 10*3/uL (ref 0.0–0.1)
BASOS PCT: 0 %
Eosinophils Absolute: 0.1 10*3/uL (ref 0.0–0.7)
Eosinophils Relative: 1 %
HEMATOCRIT: 21.1 % — AB (ref 36.0–46.0)
HEMOGLOBIN: 6.9 g/dL — AB (ref 12.0–15.0)
LYMPHS PCT: 9 %
Lymphs Abs: 0.1 10*3/uL — ABNORMAL LOW (ref 0.7–4.0)
MCH: 25.7 pg — ABNORMAL LOW (ref 26.0–34.0)
MCHC: 33.2 g/dL (ref 30.0–36.0)
MCV: 77.6 fL — AB (ref 78.0–100.0)
MONO ABS: 0.2 10*3/uL (ref 0.1–1.0)
Monocytes Relative: 10 %
NEUTROS ABS: 1.1 10*3/uL — AB (ref 1.7–7.7)
NEUTROS PCT: 78 %
Platelets: 11 10*3/uL — CL (ref 150–400)
RBC: 2.72 MIL/uL — AB (ref 3.87–5.11)
RDW: 18.6 % — ABNORMAL HIGH (ref 11.5–15.5)
WBC: 1.5 10*3/uL — AB (ref 4.0–10.5)

## 2016-02-05 LAB — COMPREHENSIVE METABOLIC PANEL
ALBUMIN: 2.9 g/dL — AB (ref 3.5–5.0)
ALK PHOS: 56 U/L (ref 38–126)
ALT: 9 U/L — AB (ref 14–54)
AST: 16 U/L (ref 15–41)
Anion gap: 8 (ref 5–15)
BILIRUBIN TOTAL: 0.5 mg/dL (ref 0.3–1.2)
BUN: 18 mg/dL (ref 6–20)
CO2: 24 mmol/L (ref 22–32)
CREATININE: 1.19 mg/dL — AB (ref 0.44–1.00)
Calcium: 8.4 mg/dL — ABNORMAL LOW (ref 8.9–10.3)
Chloride: 102 mmol/L (ref 101–111)
GFR calc Af Amer: 50 mL/min — ABNORMAL LOW (ref >60)
GFR, EST NON AFRICAN AMERICAN: 43 mL/min — AB (ref >60)
GLUCOSE: 94 mg/dL (ref 65–99)
Potassium: 3.3 mmol/L — ABNORMAL LOW (ref 3.5–5.1)
Sodium: 134 mmol/L — ABNORMAL LOW (ref 135–145)
TOTAL PROTEIN: 5.9 g/dL — AB (ref 6.5–8.1)

## 2016-02-05 LAB — ABO/RH: ABO/RH(D): A POS

## 2016-02-05 LAB — MAGNESIUM: MAGNESIUM: 1.6 mg/dL — AB (ref 1.7–2.4)

## 2016-02-05 LAB — FERRITIN: FERRITIN: 1028 ng/mL — AB (ref 11–307)

## 2016-02-05 LAB — PREPARE RBC (CROSSMATCH)

## 2016-02-05 MED ORDER — SODIUM CHLORIDE 0.9 % IV SOLN
250.0000 mL | Freq: Once | INTRAVENOUS | Status: AC
  Administered 2016-02-05: 250 mL via INTRAVENOUS

## 2016-02-05 MED ORDER — HEPARIN SOD (PORK) LOCK FLUSH 100 UNIT/ML IV SOLN
500.0000 [IU] | Freq: Every day | INTRAVENOUS | Status: AC | PRN
  Administered 2016-02-05: 500 [IU]
  Filled 2016-02-05: qty 5

## 2016-02-05 MED ORDER — ACETAMINOPHEN 325 MG PO TABS
650.0000 mg | ORAL_TABLET | Freq: Once | ORAL | Status: AC
  Administered 2016-02-05: 650 mg via ORAL
  Filled 2016-02-05: qty 2

## 2016-02-05 MED ORDER — DIPHENHYDRAMINE HCL 25 MG PO CAPS
25.0000 mg | ORAL_CAPSULE | Freq: Once | ORAL | Status: AC
  Administered 2016-02-05: 25 mg via ORAL
  Filled 2016-02-05: qty 1

## 2016-02-05 MED ORDER — SODIUM CHLORIDE 0.9% FLUSH: 10.0000 mL | INTRAVENOUS | Status: DC | PRN

## 2016-02-05 NOTE — Progress Notes (Signed)
1330:  Tolerated platelet transfusion w/o adverse reaction.  A&Ox4, in no distress.  VSS.

## 2016-02-05 NOTE — Patient Instructions (Signed)
[place] at [facility] Discharge Instructions  RECOMMENDATIONS MADE BY THE CONSULTANT AND ANY TEST RESULTS WILL BE SENT TO YOUR REFERRING PHYSICIAN.  Today you received one unit of platelets. Return as scheduled tomorrow for blood transfusion.   Thank you for choosing [place] at [facility] to provide your oncology and hematology care.  To afford each patient quality time with our provider, please arrive at least 15 minutes before your scheduled appointment time.   Beginning January 23rd 2017 lab work for the [facility] will be done in the  Main lab at [facility] on 1st floor. If you have a lab appointment with the [facility] please come in thru the  Main Entrance and check in at the main information desk  You need to re-schedule your appointment should you arrive 10 or more minutes late.  We strive to give you quality time with our providers, and arriving late affects you and other patients whose appointments are after yours.  Also, if you no show three or more times for appointments you may be dismissed from the clinic at the providers discretion.     Again, thank you for choosing [facility].  Our hope is that these requests will decrease the amount of time that you wait before being seen by our physicians.       _____________________________________________________________  Should you have questions after your visit to [facility], please contact our office at [phone] between the hours of 8:30 a.m. and 4:30 p.m.  Voicemails left after 4:30 p.m. will not be returned until the following business day.  For prescription refill requests, have your pharmacy contact our office.         Resources For Cancer Patients and their Caregivers ? American Cancer Society: Can assist with transportation, wigs, general needs, runs Look Good Feel Better.        [phone] ? Cancer Care: Provides financial  assistance, online support groups, medication/co-pay assistance.  1-800-813-HOPE 709-129-0808) ? [place] Assists [place] cancer patients and their families through emotional , educational and financial support.  303 463 3428 ? [place] DSS Where to apply for food stamps, Medicaid and utility assistance. 904-235-2487 ? RCATS: Transportation to medical appointments. ([phone] [phone] ? Social Security Administration: May apply for disability if have a Stage IV cancer. [phone] 401-795-6461 ? [employer], Disability and Transit Services: Assists with nutrition, care and transit needs. [place] Support Programs: '@10RELATIVEDAYS'$ @ > Cancer Support Group  2nd Tuesday of the month 1pm-2pm, Journey Room  > Creative Journey  3rd Tuesday of the month 1130am-1pm, Journey Room  > Look Good Feel Better  1st Wednesday of the month 10am-12 noon, Journey Room (Call [place] to register [phone])

## 2016-02-06 ENCOUNTER — Encounter (HOSPITAL_COMMUNITY): Payer: Commercial Managed Care - HMO

## 2016-02-06 ENCOUNTER — Ambulatory Visit (HOSPITAL_COMMUNITY): Payer: Commercial Managed Care - HMO | Admitting: Hematology & Oncology

## 2016-02-06 ENCOUNTER — Encounter (HOSPITAL_BASED_OUTPATIENT_CLINIC_OR_DEPARTMENT_OTHER): Payer: Commercial Managed Care - HMO

## 2016-02-06 VITALS — BP 130/61 | HR 69 | Temp 98.4°F | Resp 16

## 2016-02-06 DIAGNOSIS — Z510 Encounter for antineoplastic radiation therapy: Secondary | ICD-10-CM | POA: Diagnosis not present

## 2016-02-06 DIAGNOSIS — C3491 Malignant neoplasm of unspecified part of right bronchus or lung: Secondary | ICD-10-CM

## 2016-02-06 DIAGNOSIS — Z9889 Other specified postprocedural states: Secondary | ICD-10-CM | POA: Diagnosis not present

## 2016-02-06 DIAGNOSIS — Z888 Allergy status to other drugs, medicaments and biological substances status: Secondary | ICD-10-CM | POA: Diagnosis not present

## 2016-02-06 DIAGNOSIS — F17210 Nicotine dependence, cigarettes, uncomplicated: Secondary | ICD-10-CM | POA: Diagnosis not present

## 2016-02-06 DIAGNOSIS — C3411 Malignant neoplasm of upper lobe, right bronchus or lung: Secondary | ICD-10-CM | POA: Diagnosis not present

## 2016-02-06 DIAGNOSIS — M8580 Other specified disorders of bone density and structure, unspecified site: Secondary | ICD-10-CM | POA: Diagnosis not present

## 2016-02-06 DIAGNOSIS — D649 Anemia, unspecified: Secondary | ICD-10-CM

## 2016-02-06 DIAGNOSIS — C3490 Malignant neoplasm of unspecified part of unspecified bronchus or lung: Secondary | ICD-10-CM | POA: Diagnosis not present

## 2016-02-06 DIAGNOSIS — E785 Hyperlipidemia, unspecified: Secondary | ICD-10-CM | POA: Diagnosis not present

## 2016-02-06 LAB — PREPARE PLATELET PHERESIS: UNIT DIVISION: 0

## 2016-02-06 MED ORDER — SODIUM CHLORIDE 0.9% FLUSH
10.0000 mL | INTRAVENOUS | Status: AC | PRN
  Administered 2016-02-06: 10 mL

## 2016-02-06 MED ORDER — HEPARIN SOD (PORK) LOCK FLUSH 100 UNIT/ML IV SOLN
500.0000 [IU] | Freq: Every day | INTRAVENOUS | Status: AC | PRN
  Administered 2016-02-06: 500 [IU]

## 2016-02-06 MED ORDER — DIPHENHYDRAMINE HCL 25 MG PO CAPS
ORAL_CAPSULE | ORAL | Status: AC
  Filled 2016-02-06: qty 1

## 2016-02-06 MED ORDER — ACETAMINOPHEN 325 MG PO TABS
ORAL_TABLET | ORAL | Status: AC
  Filled 2016-02-06: qty 2

## 2016-02-06 MED ORDER — ACETAMINOPHEN 325 MG PO TABS
650.0000 mg | ORAL_TABLET | Freq: Once | ORAL | Status: AC
  Administered 2016-02-06: 650 mg via ORAL

## 2016-02-06 MED ORDER — DIPHENHYDRAMINE HCL 25 MG PO CAPS
25.0000 mg | ORAL_CAPSULE | Freq: Once | ORAL | Status: AC
  Administered 2016-02-06: 25 mg via ORAL

## 2016-02-06 MED ORDER — SODIUM CHLORIDE 0.9 % IV SOLN
250.0000 mL | Freq: Once | INTRAVENOUS | Status: AC
Start: 2016-02-06 — End: 2016-02-06
  Administered 2016-02-06: 250 mL via INTRAVENOUS

## 2016-02-06 NOTE — Patient Instructions (Signed)
[place] at [facility] Discharge Instructions  RECOMMENDATIONS MADE BY THE CONSULTANT AND ANY TEST RESULTS WILL BE SENT TO YOUR REFERRING PHYSICIAN.  Today you received 2 units packed red blood cell transfusion. Return as scheduled.  Thank you for choosing [place] at [facility] to provide your oncology and hematology care.  To afford each patient quality time with our provider, please arrive at least 15 minutes before your scheduled appointment time.   Beginning January 23rd 2017 lab work for the [facility] will be done in the  Main lab at [facility] on 1st floor. If you have a lab appointment with the [facility] please come in thru the  Main Entrance and check in at the main information desk  You need to re-schedule your appointment should you arrive 10 or more minutes late.  We strive to give you quality time with our providers, and arriving late affects you and other patients whose appointments are after yours.  Also, if you no show three or more times for appointments you may be dismissed from the clinic at the providers discretion.     Again, thank you for choosing [facility].  Our hope is that these requests will decrease the amount of time that you wait before being seen by our physicians.       _____________________________________________________________  Should you have questions after your visit to [facility], please contact our office at [phone] between the hours of 8:30 a.m. and 4:30 p.m.  Voicemails left after 4:30 p.m. will not be returned until the following business day.  For prescription refill requests, have your pharmacy contact our office.         Resources For Cancer Patients and their Caregivers ? American Cancer Society: Can assist with transportation, wigs, general needs, runs Look Good Feel Better.        310-664-3588 ? Cancer Care: Provides financial assistance,  online support groups, medication/co-pay assistance.  1-800-813-HOPE (540)299-7291) ? [place] Assists [place] cancer patients and their families through emotional , educational and financial support.  925-563-7530 ? [place] DSS Where to apply for food stamps, Medicaid and utility assistance. 904-681-5016 ? RCATS: Transportation to medical appointments. (626)429-5885 ? Social Security Administration: May apply for disability if have a Stage IV cancer. 484-338-1602 423-275-6007 ? [employer], Disability and Transit Services: Assists with nutrition, care and transit needs. [place] Support Programs: '@10RELATIVEDAYS'$ @ > Cancer Support Group  2nd Tuesday of the month 1pm-2pm, Journey Room  > Creative Journey  3rd Tuesday of the month 1130am-1pm, Journey Room  > Look Good Feel Better  1st Wednesday of the month 10am-12 noon, Journey Room (Call [place] to register [phone])

## 2016-02-06 NOTE — Progress Notes (Signed)
1315 tolerated 1st unit packed red blood cell transfusion without any complications. 1535 tolerated 2nd unit packed red blood cell transfusion without any complications. Stable on discharge home, left ambulatory with daughter.

## 2016-02-06 NOTE — Progress Notes (Signed)
Janet Fletcher, [place] [address]  Small cell carcinoma of right lung [facility]) - Plan: granisetron (SANCUSO) 3.1 MG/24HR, CBC with Differential, Comprehensive metabolic panel, Magnesium, CBC with Differential, Comprehensive metabolic panel, Magnesium, DISCONTINUED: granisetron (SANCUSO) 3.1 MG/24HR  Nausea without vomiting - Plan: granisetron (SANCUSO) 3.1 MG/24HR, DISCONTINUED: granisetron (SANCUSO) 3.1 MG/24HR  CURRENT THERAPY: Cisplatin/Etoposide beginning on 12/13/2015.  Also undergoing right thoracic XRT.  INTERVAL HISTORY: Janet Fletcher 80 y.o. 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    Small cell carcinoma of right lung ([employer])   11/28/2015 Imaging Large anterior mediastinal mass in continuity with a R hilar mass, narrowing of SVC, abnormal densities in RUL, 9 mm spiculated nodule, nonspec. hypodensity in liver   12/05/2015 PET scan Large hypermetabolic paratracheal mass c/w SCLC, perihilar nodular densities in RML, mild metabolic activity RUL nodule, no distant metastatic disease   12/11/2015 Procedure Video bronch with biopsies and brushings, endobronchial ultrasound with mediastinal LN aspiration. Dr. Roxan Hockey   12/11/2015 Pathology Results Trachea biopsy negative, FNA RUL malignant cells c/w SCLC   12/13/2015 -  Chemotherapy Cisplatin/Etoposide   12/25/2015 Imaging MRI brain- No intracranial parenchymal enhancing lesion. Tiny right frontal calvarial enhancing lesion. Small metastatic lesion not excluded although this may represent an incidentally detected benign process.   12/29/2015 -  Radiation Therapy XRT    Weight is down 3-5 lbs compared to 2 weeks ago.  She notes that her nausea is much improved with Sancuso patch.  She admits to early satiety.  She is educated on appropriate hydration.  She notes that she feels much improved compared to last week.  Her fatigue is improved as well.   Review of Systems    Constitutional: Positive for weight loss. Negative for fever, chills and malaise/fatigue.  HENT: Negative.  Negative for sore throat.   Eyes: Negative.   Respiratory: Negative.  Negative for cough, sputum production and shortness of breath.   Cardiovascular: Negative.   Gastrointestinal: Negative.  Negative for nausea and vomiting.  Genitourinary: Negative.  Negative for dysuria.  Musculoskeletal: Negative.  Negative for falls.  Skin: Negative.   Neurological: Negative.  Negative for dizziness and weakness.  Endo/Heme/Allergies: Negative.   Psychiatric/Behavioral: Negative.     Past Medical History  Diagnosis Date  . Hyperlipidemia   . Allergy   . Osteopenia   . Low vitamin B12 level 12/15/2015  . Iron deficiency anemia 12/15/2015  . Cancer ([facility])     lung    Past Surgical History  Procedure Laterality Date  . Small intestine surgery    . Cataract extraction, bilateral Bilateral   . Minor excision ear canal cyst Left     30+ years ago  . Video bronchoscopy with endobronchial ultrasound N/A 12/11/2015    Procedure: VIDEO BRONCHOSCOPY WITH ENDOBRONCHIAL ULTRASOUND;  Surgeon: Melrose Nakayama, MD;  Location: [facility] OR;  Service: Thoracic;  Laterality: N/A;    Family History  Problem Relation Age of Onset  . Cancer Mother   . Hypertension Father     Social History   Social History  . Marital Status: Married    Spouse Name: N/A  . Number of Children: N/A  . Years of Education: N/A   Social History Main Topics  . Smoking status: Current Every Day Smoker -- 0.25 packs/day for 50 years  . Smokeless tobacco: Never Used  . Alcohol Use: No  . Drug Use: No  . Sexual Activity:  Not Asked   Other Topics Concern  . None   Social History Narrative     PHYSICAL EXAMINATION  ECOG PERFORMANCE STATUS: 1 - Symptomatic but completely ambulatory  Filed Vitals:   02/07/16 1050  BP: 121/65  Pulse: 72  Temp: 97.4 F (36.3 C)  Resp: 16    GENERAL:alert, no distress, well  nourished, well developed, comfortable, cooperative, smiling and accompanied by her 2 daughters and son.  Late for scheduled appointment due to XRT appointment. SKIN: skin color, texture, turgor are normal, no rashes or significant lesions HEAD: Normocephalic, No masses, lesions, tenderness or abnormalities EYES: normal, EOMI, Conjunctiva are pink and non-injected EARS: External ears normal OROPHARYNX:lips, buccal mucosa, and tongue normal and mucous membranes are moist  NECK: supple, thyroid normal size, non-tender, without nodularity, trachea midline LYMPH:  no palpable lymphadenopathy BREAST:not examined LUNGS: clear to auscultation and percussion HEART: regular rate & rhythm, no murmurs, no gallops, S1 normal and S2 normal ABDOMEN:abdomen soft, non-tender and normal bowel sounds BACK: Back symmetric, no curvature. EXTREMITIES:less then 2 second capillary refill, no joint deformities, effusion, or inflammation, no skin discoloration, no cyanosis  NEURO: alert & oriented x 3 with fluent speech, no focal motor/sensory deficits, gait normal   LABORATORY DATA: CBC    Component Value Date/Time   WBC 4.7 02/07/2016 1205   WBC 5.5 08/20/2013 1559   RBC 4.12 02/07/2016 1205   RBC 4.6 08/20/2013 1559   HGB 11.2* 02/07/2016 1205   HGB 10.1* 08/20/2013 1559   HCT 33.5* 02/07/2016 1205   HCT 33.1* 08/20/2013 1559   PLT 44* 02/07/2016 1205   MCV 81.3 02/07/2016 1205   MCV 71.9* 08/20/2013 1559   MCH 27.2 02/07/2016 1205   MCH 21.9* 08/20/2013 1559   MCHC 33.4 02/07/2016 1205   MCHC 30.5* 08/20/2013 1559   RDW 18.1* 02/07/2016 1205   LYMPHSABS 0.8 02/07/2016 1205   MONOABS 0.1 02/07/2016 1205   EOSABS 0.0 02/07/2016 1205   BASOSABS 0.0 02/07/2016 1205      Chemistry      Component Value Date/Time   NA 138 02/07/2016 1205   NA 143 09/29/2014 1203   K 3.3* 02/07/2016 1205   CL 106 02/07/2016 1205   CO2 25 02/07/2016 1205   BUN 22* 02/07/2016 1205   BUN 12 09/29/2014 1203    CREATININE 1.14* 02/07/2016 1205   CREATININE 0.76 01/06/2013 1428      Component Value Date/Time   CALCIUM 8.8* 02/07/2016 1205   ALKPHOS 67 02/07/2016 1205   AST 16 02/07/2016 1205   ALT 10* 02/07/2016 1205   BILITOT 1.0 02/07/2016 1205   BILITOT 0.4 09/29/2014 1203     Lab Results  Component Value Date   FERRITIN 1028* 02/05/2016    PENDING LABS:   RADIOGRAPHIC STUDIES:  Dg Chest 2 View  01/31/2016  CLINICAL DATA:  Lung cancer with generalized weakness. EXAM: CHEST  2 VIEW COMPARISON:  12/11/2015 FINDINGS: The lungs are clear wiithout focal pneumonia, edema, pneumothorax or pleural effusion. Hyperexpansion is consistent with emphysema. Symmetric nodular densities projecting over each lower lung are compatible with nipple shadows. The cardiopericardial silhouette is within normal limits for size. The visualized bony structures of the thorax are intact. Telemetry leads overlie the chest. IMPRESSION: No active cardiopulmonary disease. Electronically Signed   By: Misty Stanley M.D.   On: 01/31/2016 12:53     PATHOLOGY:    ASSESSMENT AND PLAN:  Small cell carcinoma of right lung (HCC) Limited stage small cell lung cancer (  right) began systemic chemotherapy on 12/13/2015 with curative intent Cisplatin/Etoposide.  Currently receiving thoracic XRT as well.  Oncology history is updated.  For patients with LS-SCLC treated with contemporary chemoradiotherapy and prophylactic cranial irradiation, overall response rates of 80 to 90 percent, including 50 to 60 percent complete response rates, are typically reported. Median survival is around 17 months, and the five-year survival rate is about 20 percent.  She had labs on 02/05/2016 demonstrating a severe pancytopenia secondary to chemotherapy.  She was given Neulasta and her WBC count has improved compared to her nadir.  She was given 1 unit of platelets on 02/05/2016 for a platelet count of 11,000 and 2 units of PRBCs on 02/06/2016 for a HGB  of 6.9 g/dL.  Labs today: CBC diff, CMET.  I personally reviewed and went over laboratory results with the patient.  The results are noted within this dictation.  HGB today is much improved.  WBC is WNL.  Platelet count is improved, but thrombocytopenia remains at 44,000.  No active bleeding.  No role for platelet transfusion today.  We discussed the importance of hydration and caloric intake.  Patient is resistant to following directions from the patient's family regarding fluid intake and food intake.  Patient notes early satiety.  She is provided education regarding caloric intake and hydration.   Her nausea is much improved with Sancuso patch and therefore, I have provided an Rx for 3 more patches to complete the rest of her treatment.    Return in 1 weeks for follow-up and next cycle of chemotherapy at a reduced dose.      ORDERS PLACED FOR THIS ENCOUNTER: Orders Placed This Encounter  Procedures  . CBC with Differential  . Comprehensive metabolic panel  . Magnesium    MEDICATIONS PRESCRIBED THIS ENCOUNTER: Meds ordered this encounter  Medications  . DISCONTD: granisetron (SANCUSO) 3.1 MG/24HR    Sig: Apply to skin starting 24 hours before chemotherapy. Remove after 7 days.    Dispense:  4 patch    Refill:  1    Order Specific Question:  Supervising Provider    Answer:  Patrici Ranks [id number]  . granisetron (SANCUSO) 3.1 MG/24HR    Sig: Apply to skin starting 24 hours before chemotherapy. Remove after 7 days.    Dispense:  3 patch    Refill:  0    Order Specific Question:  Supervising Provider    Answer:  Patrici Ranks [id number]    THERAPY PLAN:  Continue treatment as planned with curative intent.  All questions were answered. The patient knows to call the clinic with any problems, questions or concerns. We can certainly see the patient much sooner if necessary.  Patient and plan discussed with Dr. Ancil Linsey and she is in agreement with the  aforementioned.   This note is electronically signed by: Doy Mince 02/07/2016 9:56 PM

## 2016-02-07 ENCOUNTER — Encounter (HOSPITAL_BASED_OUTPATIENT_CLINIC_OR_DEPARTMENT_OTHER): Payer: Commercial Managed Care - HMO | Admitting: Oncology

## 2016-02-07 ENCOUNTER — Encounter (HOSPITAL_COMMUNITY): Payer: Commercial Managed Care - HMO

## 2016-02-07 ENCOUNTER — Encounter (HOSPITAL_COMMUNITY): Payer: Self-pay | Admitting: Oncology

## 2016-02-07 VITALS — BP 121/65 | HR 72 | Temp 97.4°F | Resp 16 | Wt 115.8 lb

## 2016-02-07 DIAGNOSIS — D696 Thrombocytopenia, unspecified: Secondary | ICD-10-CM | POA: Diagnosis not present

## 2016-02-07 DIAGNOSIS — R110 Nausea: Secondary | ICD-10-CM

## 2016-02-07 DIAGNOSIS — Z9889 Other specified postprocedural states: Secondary | ICD-10-CM | POA: Diagnosis not present

## 2016-02-07 DIAGNOSIS — Z720 Tobacco use: Secondary | ICD-10-CM

## 2016-02-07 DIAGNOSIS — E785 Hyperlipidemia, unspecified: Secondary | ICD-10-CM | POA: Diagnosis not present

## 2016-02-07 DIAGNOSIS — Z888 Allergy status to other drugs, medicaments and biological substances status: Secondary | ICD-10-CM | POA: Diagnosis not present

## 2016-02-07 DIAGNOSIS — D649 Anemia, unspecified: Secondary | ICD-10-CM | POA: Diagnosis not present

## 2016-02-07 DIAGNOSIS — Z510 Encounter for antineoplastic radiation therapy: Secondary | ICD-10-CM | POA: Diagnosis not present

## 2016-02-07 DIAGNOSIS — C3491 Malignant neoplasm of unspecified part of right bronchus or lung: Secondary | ICD-10-CM

## 2016-02-07 DIAGNOSIS — M8580 Other specified disorders of bone density and structure, unspecified site: Secondary | ICD-10-CM | POA: Diagnosis not present

## 2016-02-07 DIAGNOSIS — C3490 Malignant neoplasm of unspecified part of unspecified bronchus or lung: Secondary | ICD-10-CM | POA: Diagnosis not present

## 2016-02-07 DIAGNOSIS — C3411 Malignant neoplasm of upper lobe, right bronchus or lung: Secondary | ICD-10-CM | POA: Diagnosis not present

## 2016-02-07 DIAGNOSIS — F17210 Nicotine dependence, cigarettes, uncomplicated: Secondary | ICD-10-CM | POA: Diagnosis not present

## 2016-02-07 LAB — COMPREHENSIVE METABOLIC PANEL
ALT: 10 U/L — AB (ref 14–54)
AST: 16 U/L (ref 15–41)
Albumin: 3.4 g/dL — ABNORMAL LOW (ref 3.5–5.0)
Alkaline Phosphatase: 67 U/L (ref 38–126)
Anion gap: 7 (ref 5–15)
BILIRUBIN TOTAL: 1 mg/dL (ref 0.3–1.2)
BUN: 22 mg/dL — AB (ref 6–20)
CO2: 25 mmol/L (ref 22–32)
CREATININE: 1.14 mg/dL — AB (ref 0.44–1.00)
Calcium: 8.8 mg/dL — ABNORMAL LOW (ref 8.9–10.3)
Chloride: 106 mmol/L (ref 101–111)
GFR calc Af Amer: 52 mL/min — ABNORMAL LOW (ref >60)
GFR, EST NON AFRICAN AMERICAN: 45 mL/min — AB (ref >60)
Glucose, Bld: 79 mg/dL (ref 65–99)
POTASSIUM: 3.3 mmol/L — AB (ref 3.5–5.1)
Sodium: 138 mmol/L (ref 135–145)
TOTAL PROTEIN: 6.5 g/dL (ref 6.5–8.1)

## 2016-02-07 LAB — CBC WITH DIFFERENTIAL/PLATELET
Basophils Absolute: 0 10*3/uL (ref 0.0–0.1)
Basophils Relative: 0 %
EOS PCT: 1 %
Eosinophils Absolute: 0 10*3/uL (ref 0.0–0.7)
HEMATOCRIT: 33.5 % — AB (ref 36.0–46.0)
Hemoglobin: 11.2 g/dL — ABNORMAL LOW (ref 12.0–15.0)
LYMPHS ABS: 0.8 10*3/uL (ref 0.7–4.0)
LYMPHS PCT: 16 %
MCH: 27.2 pg (ref 26.0–34.0)
MCHC: 33.4 g/dL (ref 30.0–36.0)
MCV: 81.3 fL (ref 78.0–100.0)
MONOS PCT: 3 %
Monocytes Absolute: 0.1 10*3/uL (ref 0.1–1.0)
Neutro Abs: 3.8 10*3/uL (ref 1.7–7.7)
Neutrophils Relative %: 80 %
Platelets: 44 10*3/uL — ABNORMAL LOW (ref 150–400)
RBC: 4.12 MIL/uL (ref 3.87–5.11)
RDW: 18.1 % — AB (ref 11.5–15.5)
WBC: 4.7 10*3/uL (ref 4.0–10.5)

## 2016-02-07 LAB — TYPE AND SCREEN
ABO/RH(D): A POS
ANTIBODY SCREEN: NEGATIVE
UNIT DIVISION: 0
Unit division: 0

## 2016-02-07 LAB — MAGNESIUM: MAGNESIUM: 1.9 mg/dL (ref 1.7–2.4)

## 2016-02-07 MED ORDER — HEPARIN SOD (PORK) LOCK FLUSH 100 UNIT/ML IV SOLN
INTRAVENOUS | Status: AC
  Filled 2016-02-07: qty 5

## 2016-02-07 MED ORDER — GRANISETRON 3.1 MG/24HR TD PTCH: MEDICATED_PATCH | TRANSDERMAL | Status: DC

## 2016-02-07 NOTE — Patient Instructions (Signed)
[place] at [facility] Discharge Instructions  RECOMMENDATIONS MADE BY THE CONSULTANT AND ANY TEST RESULTS WILL BE SENT TO YOUR REFERRING PHYSICIAN.  You saw Dr. Gershon Mussel today.  You need labs drawn today. Return next week for follow up and treatment. You have received a prescription for Sancuso. Apply the Sancuso patch 24 hours before chemo appointment. Remove the Sancuso patch 7 days after putting on. Do not apply another patch until 24 hours prior to you next chemo.  Thank you for choosing [place] at [facility] to provide your oncology and hematology care.  To afford each patient quality time with our provider, please arrive at least 15 minutes before your scheduled appointment time.   Beginning January 23rd 2017 lab work for the [facility] will be done in the  Main lab at [facility] on 1st floor. If you have a lab appointment with the [facility] please come in thru the  Main Entrance and check in at the main information desk  You need to re-schedule your appointment should you arrive 10 or more minutes late.  We strive to give you quality time with our providers, and arriving late affects you and other patients whose appointments are after yours.  Also, if you no show three or more times for appointments you may be dismissed from the clinic at the providers discretion.     Again, thank you for choosing [facility].  Our hope is that these requests will decrease the amount of time that you wait before being seen by our physicians.       _____________________________________________________________  Should you have questions after your visit to [facility], please contact our office at [phone] between the hours of 8:30 a.m. and 4:30 p.m.  Voicemails left after 4:30 p.m. will not be returned until the following business day.  For prescription refill requests, have your pharmacy contact our office.          Resources For Cancer Patients and their Caregivers ? American Cancer Society: Can assist with transportation, wigs, general needs, runs Look Good Feel Better.        [phone] ? Cancer Care: Provides financial assistance, online support groups, medication/co-pay assistance.  1-800-813-HOPE 478 397 5448) ? [place] [place] Assists [place] cancer patients and their families through emotional , educational and financial support.  819-619-1107 ? [place] DSS Where to apply for food stamps, Medicaid and utility assistance. 856-166-4562 ? RCATS: Transportation to medical appointments. (972)567-6847 ? Social Security Administration: May apply for disability if have a Stage IV cancer. [phone] ? [employer], Disability and Transit Services: Assists with nutrition, care and transit needs. [place] Support Programs: '@10RELATIVEDAYS'$ @ > Cancer Support Group  2nd Tuesday of the month 1pm-2pm, Journey Room  > Creative Journey  3rd Tuesday of the month 1130am-1pm, Journey Room  > Look Good Feel Better  1st Wednesday of the month 10am-12 noon, Journey Room (Call [place] to register [phone])

## 2016-02-07 NOTE — Assessment & Plan Note (Addendum)
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  Currently receiving thoracic XRT as well.  Oncology history is updated.  For patients with LS-SCLC treated with contemporary chemoradiotherapy and prophylactic cranial irradiation, overall response rates of 80 to 90 percent, including 50 to 60 percent complete response rates, are typically reported. Median survival is around 17 months, and the five-year survival rate is about 20 percent.  She had labs on 02/05/2016 demonstrating a severe pancytopenia secondary to chemotherapy.  She was given Neulasta and her WBC count has improved compared to her nadir.  She was given 1 unit of platelets on 02/05/2016 for a platelet count of 11,000 and 2 units of PRBCs on 02/06/2016 for a HGB of 6.9 g/dL.  Labs today: CBC diff, CMET.  I personally reviewed and went over laboratory results with the patient.  The results are noted within this dictation.  HGB today is much improved.  WBC is WNL.  Platelet count is improved, but thrombocytopenia remains at 44,000.  No active bleeding.  No role for platelet transfusion today.  We discussed the importance of hydration and caloric intake.  Patient is resistant to following directions from the patient's family regarding fluid intake and food intake.  Patient notes early satiety.  She is provided education regarding caloric intake and hydration.   Her nausea is much improved with Sancuso patch and therefore, I have provided an Rx for 3 more patches to complete the rest of her treatment.    Return in 1 weeks for follow-up and next cycle of chemotherapy at a reduced dose.

## 2016-02-08 ENCOUNTER — Other Ambulatory Visit (HOSPITAL_COMMUNITY): Payer: Self-pay | Admitting: *Deleted

## 2016-02-08 DIAGNOSIS — C3411 Malignant neoplasm of upper lobe, right bronchus or lung: Secondary | ICD-10-CM | POA: Diagnosis not present

## 2016-02-08 DIAGNOSIS — Z510 Encounter for antineoplastic radiation therapy: Secondary | ICD-10-CM | POA: Diagnosis not present

## 2016-02-08 DIAGNOSIS — E876 Hypokalemia: Secondary | ICD-10-CM

## 2016-02-08 MED ORDER — POTASSIUM CHLORIDE CRYS ER 20 MEQ PO TBCR
20.0000 meq | EXTENDED_RELEASE_TABLET | Freq: Two times a day (BID) | ORAL | Status: AC
  Filled 2016-02-08 (×2): qty 1

## 2016-02-08 MED ORDER — POTASSIUM CHLORIDE CRYS ER 20 MEQ PO TBCR: 20.0000 meq | EXTENDED_RELEASE_TABLET | Freq: Two times a day (BID) | ORAL | Status: DC

## 2016-02-09 DIAGNOSIS — Z510 Encounter for antineoplastic radiation therapy: Secondary | ICD-10-CM | POA: Diagnosis not present

## 2016-02-09 DIAGNOSIS — C3411 Malignant neoplasm of upper lobe, right bronchus or lung: Secondary | ICD-10-CM | POA: Diagnosis not present

## 2016-02-12 DIAGNOSIS — Z510 Encounter for antineoplastic radiation therapy: Secondary | ICD-10-CM | POA: Diagnosis not present

## 2016-02-12 DIAGNOSIS — C3411 Malignant neoplasm of upper lobe, right bronchus or lung: Secondary | ICD-10-CM | POA: Diagnosis not present

## 2016-02-13 DIAGNOSIS — C3411 Malignant neoplasm of upper lobe, right bronchus or lung: Secondary | ICD-10-CM | POA: Diagnosis not present

## 2016-02-13 DIAGNOSIS — Z510 Encounter for antineoplastic radiation therapy: Secondary | ICD-10-CM | POA: Diagnosis not present

## 2016-02-14 ENCOUNTER — Encounter (HOSPITAL_COMMUNITY): Payer: Self-pay | Admitting: Hematology & Oncology

## 2016-02-14 ENCOUNTER — Inpatient Hospital Stay (HOSPITAL_COMMUNITY): Payer: Commercial Managed Care - HMO

## 2016-02-14 ENCOUNTER — Other Ambulatory Visit (HOSPITAL_COMMUNITY): Payer: Self-pay | Admitting: Oncology

## 2016-02-14 ENCOUNTER — Encounter: Payer: Self-pay | Admitting: Dietician

## 2016-02-14 ENCOUNTER — Encounter (HOSPITAL_COMMUNITY): Payer: Commercial Managed Care - HMO

## 2016-02-14 ENCOUNTER — Encounter (HOSPITAL_BASED_OUTPATIENT_CLINIC_OR_DEPARTMENT_OTHER): Payer: Commercial Managed Care - HMO | Admitting: Hematology & Oncology

## 2016-02-14 VITALS — BP 100/53 | HR 72 | Temp 98.1°F | Resp 18 | Wt 111.3 lb

## 2016-02-14 DIAGNOSIS — C3491 Malignant neoplasm of unspecified part of right bronchus or lung: Secondary | ICD-10-CM

## 2016-02-14 DIAGNOSIS — M8580 Other specified disorders of bone density and structure, unspecified site: Secondary | ICD-10-CM | POA: Diagnosis not present

## 2016-02-14 DIAGNOSIS — Z5111 Encounter for antineoplastic chemotherapy: Secondary | ICD-10-CM

## 2016-02-14 DIAGNOSIS — E538 Deficiency of other specified B group vitamins: Secondary | ICD-10-CM

## 2016-02-14 DIAGNOSIS — R530 Neoplastic (malignant) related fatigue: Secondary | ICD-10-CM | POA: Diagnosis not present

## 2016-02-14 DIAGNOSIS — D649 Anemia, unspecified: Secondary | ICD-10-CM

## 2016-02-14 DIAGNOSIS — D61810 Antineoplastic chemotherapy induced pancytopenia: Secondary | ICD-10-CM

## 2016-02-14 DIAGNOSIS — Z9889 Other specified postprocedural states: Secondary | ICD-10-CM | POA: Diagnosis not present

## 2016-02-14 DIAGNOSIS — Z888 Allergy status to other drugs, medicaments and biological substances status: Secondary | ICD-10-CM | POA: Diagnosis not present

## 2016-02-14 DIAGNOSIS — Z720 Tobacco use: Secondary | ICD-10-CM

## 2016-02-14 DIAGNOSIS — F17210 Nicotine dependence, cigarettes, uncomplicated: Secondary | ICD-10-CM | POA: Diagnosis not present

## 2016-02-14 DIAGNOSIS — R634 Abnormal weight loss: Secondary | ICD-10-CM

## 2016-02-14 DIAGNOSIS — E785 Hyperlipidemia, unspecified: Secondary | ICD-10-CM | POA: Diagnosis not present

## 2016-02-14 DIAGNOSIS — C3490 Malignant neoplasm of unspecified part of unspecified bronchus or lung: Secondary | ICD-10-CM | POA: Diagnosis not present

## 2016-02-14 LAB — CBC WITH DIFFERENTIAL/PLATELET
Basophils Absolute: 0 10*3/uL (ref 0.0–0.1)
Basophils Relative: 0 %
Eosinophils Absolute: 0 10*3/uL (ref 0.0–0.7)
Eosinophils Relative: 0 %
HEMATOCRIT: 33 % — AB (ref 36.0–46.0)
Hemoglobin: 10.8 g/dL — ABNORMAL LOW (ref 12.0–15.0)
LYMPHS PCT: 4 %
Lymphs Abs: 0.3 10*3/uL — ABNORMAL LOW (ref 0.7–4.0)
MCH: 27.7 pg (ref 26.0–34.0)
MCHC: 32.7 g/dL (ref 30.0–36.0)
MCV: 84.6 fL (ref 78.0–100.0)
MONO ABS: 0.7 10*3/uL (ref 0.1–1.0)
MONOS PCT: 11 %
NEUTROS ABS: 4.9 10*3/uL (ref 1.7–7.7)
Neutrophils Relative %: 85 %
Platelets: 133 10*3/uL — ABNORMAL LOW (ref 150–400)
RBC: 3.9 MIL/uL (ref 3.87–5.11)
RDW: 20.2 % — AB (ref 11.5–15.5)
WBC: 5.9 10*3/uL (ref 4.0–10.5)

## 2016-02-14 LAB — COMPREHENSIVE METABOLIC PANEL
ALK PHOS: 66 U/L (ref 38–126)
ALT: 8 U/L — ABNORMAL LOW (ref 14–54)
ANION GAP: 3 — AB (ref 5–15)
AST: 20 U/L (ref 15–41)
Albumin: 3.1 g/dL — ABNORMAL LOW (ref 3.5–5.0)
BILIRUBIN TOTAL: 0.3 mg/dL (ref 0.3–1.2)
BUN: 18 mg/dL (ref 6–20)
CALCIUM: 8.5 mg/dL — AB (ref 8.9–10.3)
CO2: 26 mmol/L (ref 22–32)
Chloride: 106 mmol/L (ref 101–111)
Creatinine, Ser: 1.22 mg/dL — ABNORMAL HIGH (ref 0.44–1.00)
GFR calc Af Amer: 48 mL/min — ABNORMAL LOW (ref >60)
GFR, EST NON AFRICAN AMERICAN: 42 mL/min — AB (ref >60)
Glucose, Bld: 100 mg/dL — ABNORMAL HIGH (ref 65–99)
POTASSIUM: 5 mmol/L (ref 3.5–5.1)
Sodium: 135 mmol/L (ref 135–145)
TOTAL PROTEIN: 6.1 g/dL — AB (ref 6.5–8.1)

## 2016-02-14 LAB — MAGNESIUM: MAGNESIUM: 1.7 mg/dL (ref 1.7–2.4)

## 2016-02-14 MED ORDER — HEPARIN SOD (PORK) LOCK FLUSH 100 UNIT/ML IV SOLN
500.0000 [IU] | Freq: Once | INTRAVENOUS | Status: DC | PRN
  Filled 2016-02-14: qty 5

## 2016-02-14 MED ORDER — HEPARIN SOD (PORK) LOCK FLUSH 100 UNIT/ML IV SOLN
500.0000 [IU] | Freq: Once | INTRAVENOUS | Status: AC
  Administered 2016-02-14: 500 [IU] via INTRAVENOUS

## 2016-02-14 MED ORDER — POTASSIUM CHLORIDE 2 MEQ/ML IV SOLN
Freq: Once | INTRAVENOUS | Status: AC
  Administered 2016-02-14: 10:00:00 via INTRAVENOUS
  Filled 2016-02-14: qty 10

## 2016-02-14 MED ORDER — SODIUM CHLORIDE 0.9 % IV SOLN
Freq: Once | INTRAVENOUS | Status: AC
  Administered 2016-02-14: 10:00:00 via INTRAVENOUS

## 2016-02-14 NOTE — Progress Notes (Signed)
No chemotherapy today per Dr.Penland. Patient to receive hydration fluids x 1L per treatment plan.

## 2016-02-14 NOTE — Progress Notes (Signed)
Follow up with pt  Contacted Pt by Visiting after office visit   Wt Readings from Last 10 Encounters:  02/14/16 111 lb 4.8 oz (50.5 kg)  02/07/16 115 lb 12.8 oz (52.5 kg)  02/02/16 118 lb 12.8 oz (53.9 kg)  01/31/16 120 lb (54.4 kg)  01/26/16 124 lb (56.2 kg)  01/25/16 122 lb 6.4 oz (55.5 kg)  01/24/16 117 lb (53.1 kg)  01/10/16 118 lb (53.5 kg)  01/04/16 126 lb 6.4 oz (57.3 kg)  01/03/16 123 lb (55.8 kg)   Patient weight has fallen by another 4 lbs  Treatment was held today due to pt's questionable nutritional status.   Have spoken to pt multiple times before. She always report superb PO intake and hydration., however her family members typically say her report isn't accurate. When calorie/fluid counts from pt's report are estimated, she is believed to be eating/drinking enough to meet needs (see last encounter). However, given pt's history as poor historian it is very difficult to determine what pt stated information is accurate.  Patient herself reports oral intake as fair. She states she is eating 3 meals a day and drinking 3-4 Ensure plus Supplements a day. RD asked family member if she thought this was true. Family member was Unsure herself and she asked pt with more serious tone if this was true. Pt states "Yes, I drink 3-4 sometimes more". If this is accurate she would be consuming >1050-1400 calories a day from Ensure. She is vague about what her meals consist of, "normal/simple stuff".  RD probed further and said she had a half bowl of chicken dumplings for dinner recently and also drinks a bowl of soup for meals sometimes. Additionally, family member states that they have been placing a number of bottles of apple juice on the counter each morning as a goal for the patient. She is to consume all the fluids on the counter to meet her perceived fluid needs. Pt's 3 meals, several glasses of apple juice and 3-4 Ensure supplements should easily provide 35-40 g/kg BW, 1700-2000 caloris. It  is difficult to see pt expending more energy than this.   Pt does admit to drinking 0 kcal beverages (water). This is one big area pt could improve on. She is instructed to only drink kcal containing beverages. Preferably ones with protein such as milk. She says she will do this.   Symptom wise, pt says she is suffering from no appetite/ "no taste". She states she used to lover her sweets, but now "I just dont got no taste for it, you could put a piece of pie on this table and I wouldn't touch it". RD again encouraged her to take just 1-2 extra bites past the point of perceived satiation at each meal.  Pt is ellgiible for 1 more case of Ensure. Will order.   RD asked if they had ever discussed appetite stimulants with MD. THey have not. RD will touch base with MD to see if this would be appropriate given pt''s continued wt loss and reported number 1 barrier as not having any appetite.   Of note, pt just finished radiation. Hopefully, this will increase pts appetite/intake and also decrease expenditure so she is able to maintain weight.   Pt seemed to receptive to recommendations, however RD had made many of the same recommendations the prior encounter and pt had not implemented.   Burtis Junes RD, LDN, [facility] Pager: [phone] 02/14/2016 12:25 PM

## 2016-02-14 NOTE — Patient Instructions (Signed)
[place] at [facility] Discharge Instructions  RECOMMENDATIONS MADE BY THE CONSULTANT AND ANY TEST RESULTS WILL BE SENT TO YOUR REFERRING PHYSICIAN.  You saw Dr. Whitney Muse today. No chemo today. Hold potassium-Do Not Take. Return to clinic in one week for labs, follow up and chemo.  Thank you for choosing [place] at [facility] to provide your oncology and hematology care.  To afford each patient quality time with our provider, please arrive at least 15 minutes before your scheduled appointment time.   Beginning January 23rd 2017 lab work for the [facility] will be done in the  Main lab at [facility] on 1st floor. If you have a lab appointment with the [facility] please come in thru the  Main Entrance and check in at the main information desk  You need to re-schedule your appointment should you arrive 10 or more minutes late.  We strive to give you quality time with our providers, and arriving late affects you and other patients whose appointments are after yours.  Also, if you no show three or more times for appointments you may be dismissed from the clinic at the providers discretion.     Again, thank you for choosing [facility].  Our hope is that these requests will decrease the amount of time that you wait before being seen by our physicians.       _____________________________________________________________  Should you have questions after your visit to [facility], please contact our office at [phone] between the hours of 8:30 a.m. and 4:30 p.m.  Voicemails left after 4:30 p.m. will not be returned until the following business day.  For prescription refill requests, have your pharmacy contact our office.         Resources For Cancer Patients and their Caregivers ? American Cancer Society: Can assist with transportation, wigs, general needs, runs Look Good Feel Better.         ([phone] 241-1292 ? Cancer Care: Provides financial assistance, online support groups, medication/co-pay assistance.  1-800-813-HOPE (701)715-5496) ? [place] Assists [place] cancer patients and their families through emotional , educational and financial support.  (548)716-4812 ? [place] DSS Where to apply for food stamps, Medicaid and utility assistance. 617-327-1596 ? RCATS: Transportation to medical appointments. (250)858-0177 ? Social Security Administration: May apply for disability if have a Stage IV cancer. [phone] 431-837-0258 ? [employer], Disability and Transit Services: Assists with nutrition, care and transit needs. [place] Support Programs: '@10RELATIVEDAYS'$ @ > Cancer Support Group  2nd Tuesday of the month 1pm-2pm, Journey Room  > Creative Journey  3rd Tuesday of the month 1130am-1pm, Journey Room  > Look Good Feel Better  1st Wednesday of the month 10am-12 noon, Journey Room (Call [place] to register [phone])

## 2016-02-14 NOTE — Progress Notes (Signed)
Tolerated IV fluids well. Ambulatory on discharge home to self.

## 2016-02-14 NOTE — Progress Notes (Signed)
[place] at [place] NOTE  Patient Care Team: Chevis Pretty, FNP as PCP - General (Nurse Practitioner)  CHIEF COMPLAINTS/PURPOSE OF CONSULTATION:  Limited stage small cell lung cancer    Small cell carcinoma of right lung ([place])   11/28/2015 Imaging    Large anterior mediastinal mass in continuity with a R hilar mass, narrowing of SVC, abnormal densities in RUL, 9 mm spiculated nodule, nonspec. hypodensity in liver     12/05/2015 PET scan    Large hypermetabolic paratracheal mass c/w SCLC, perihilar nodular densities in RML, mild metabolic activity RUL nodule, no distant metastatic disease     12/11/2015 Procedure    Video bronch with biopsies and brushings, endobronchial ultrasound with mediastinal LN aspiration. Dr. Roxan Hockey     12/11/2015 Pathology Results    Trachea biopsy negative, FNA RUL malignant cells c/w SCLC     12/13/2015 -  Chemotherapy    Cisplatin/Etoposide     12/25/2015 Imaging    MRI brain- No intracranial parenchymal enhancing lesion. Tiny right frontal calvarial enhancing lesion. Small metastatic lesion not excluded although this may represent an incidentally detected benign process.     12/29/2015 -  Radiation Therapy    XRT       HISTORY OF PRESENTING ILLNESS:  Janet Fletcher 80 y.o. female is here for ongoing follow-up of limited stage SCLC.  Janet Fletcher is accompanied by her daughter and in treatment bed. She is here for Cycle #4 Cisplatin / Etoposide.  She recently completed radiation therapy.  When asked if she feels up to receiving treatment today, Janet Fletcher states she feels alright, "Nothing hurts me", and that it is up to the physician.   She reports difficulty swallowing large pills. She denies issues swallowing food. Weight is down 4 pounds. Appetite is marginal.   She denies smoking. The patient speaks of making homemade cakes but only tasting them. Her daughter notes she enjoys foods with salty taste more  now.   Her daughter notes she tried to get the anti-nausea patches but insurance did not cover much of them. Since they were so expensive, the patient has been using anti-nausea oral pills instead. She feels better than a few weeks ago but is markedly tired. No vomiting. No diarrhea. No fever or chills.    MEDICAL HISTORY:  Past Medical History:  Diagnosis Date  . Allergy   . Cancer ([place])    lung  . Hyperlipidemia   . Iron deficiency anemia 12/15/2015  . Low vitamin B12 level 12/15/2015  . Osteopenia     SURGICAL HISTORY: Past Surgical History:  Procedure Laterality Date  . CATARACT EXTRACTION, BILATERAL Bilateral   . MINOR EXCISION EAR CANAL CYST Left    30+ years ago  . SMALL INTESTINE SURGERY    . VIDEO BRONCHOSCOPY WITH ENDOBRONCHIAL ULTRASOUND N/A 12/11/2015   Procedure: VIDEO BRONCHOSCOPY WITH ENDOBRONCHIAL ULTRASOUND;  Surgeon: Melrose Nakayama, MD;  Location: [facility];  Service: Thoracic;  Laterality: N/A;    SOCIAL HISTORY: Social History   Social History  . Marital status: Married    Spouse name: N/A  . Number of children: N/A  . Years of education: N/A   Occupational History  . Not on file.   Social History Main Topics  . Smoking status: Current Every Day Smoker    Packs/day: 0.25    Years: 50.00  . Smokeless tobacco: Never Used  . Alcohol use No  . Drug use: No  . Sexual activity:  Not on file   Other Topics Concern  . Not on file   Social History Narrative  . No narrative on file  Married; 15 years 5 daughters 2 sons 12 grandchildren, 8 great grandchildren Smokes now but not as much; started long time ago. 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 Cancer Mother   . Hypertension Father   Mother died at 68 years old. Father died at 67 years old. 7 sisters and 7 brothers 67 living Older  sister died of leukemia.   ALLERGIES:  is allergic to acetaminophen; flagyl [metronidazole hcl]; and penicillins.  MEDICATIONS:  Current Outpatient Prescriptions  Medication Sig Dispense Refill  . CISPLATIN IV Inject into the vein. Day 1 every 21 days (to begin 12/13/15)    . ETOPOSIDE IV Inject into the vein. Days 1-3 every 21 days (to begin 12/13/15)    . granisetron (SANCUSO) 3.1 MG/24HR Apply to skin starting 24 hours before chemotherapy. Remove after 7 days. 3 patch 0  . lidocaine-prilocaine (EMLA) cream Apply a quarter size amount to port site 1 hour prior to chemo. Do not rub in. Cover with plastic wrap. 30 g 3  . magnesium chloride (SLOW-MAG) 64 MG TBEC SR tablet Take 1 tablet (64 mg total) by mouth 3 (three) times daily. 90 tablet 0  . omeprazole (PRILOSEC) 40 MG capsule Take 1 capsule (40 mg total) by mouth daily. 30 capsule 3  . ondansetron (ZOFRAN) 8 MG tablet Take 1 tablet (8 mg total) by mouth every 8 (eight) hours as needed for nausea or vomiting. 30 tablet 2  . Pegfilgrastim (NEULASTA ONPRO [place]) Inject into the skin. To be administered 27 hours after the completion of chemo.    . Polysacchar Iron-FA-B12 (FERREX 150 FORTE) 150-1-25 MG-MG-MCG CAPS Take 1 capsule by mouth daily. 30 capsule 5  . potassium chloride SA (K-DUR,KLOR-CON) 20 MEQ tablet Take 1 tablet (20 mEq total) by mouth 2 (two) times daily. 30 tablet 0  . prochlorperazine (COMPAZINE) 10 MG tablet Take 1 tablet (10 mg total) by mouth every 6 (six) hours as needed for nausea or vomiting. 30 tablet 2  . simvastatin (ZOCOR) 40 MG tablet TAKE 1 TABLET (40 MG TOTAL) BY MOUTH AT BEDTIME. 90 tablet 0   No current facility-administered medications for this visit.    Facility-Administered Medications Ordered in Other Visits  Medication Dose Route Frequency Provider Last Rate Last Dose  . 0.9 %  sodium chloride infusion   Intravenous Once Patrici Ranks, MD      . dextrose 5 % and 0.45% NaCl 1,000 mL with potassium chloride  20 mEq, magnesium sulfate 12 mEq, mannitol 12.5 g infusion   Intravenous Once Patrici Ranks, MD      . potassium chloride SA (K-DUR,KLOR-CON) CR tablet 20 mEq  20 mEq Oral BID [facility], PA-C        Review of Systems  Constitutional: Positive for weight loss. Negative for fever, chills and malaise/fatigue.       Weight loss of 4 lbs since 7/19 HENT: Positive for trouble swallowing. Negative for congestion, hearing loss, nosebleeds, sore throat and tinnitus.       Trouble swallowing large pills.  Eyes: Negative.  Negative for blurred vision, double vision, pain and discharge.  Respiratory: Negative for hemoptysis, sputum production, shortness of breath and wheezing.  Cardiovascular: Negative.  Negative for chest pain, palpitations, claudication, leg swelling and PND.  Gastrointestinal: Negative.  Negative for heartburn, nausea, vomiting, abdominal pain, diarrhea, constipation, blood in stool and melena.  Genitourinary: Negative.  Negative for dysuria, urgency, frequency and hematuria.  Musculoskeletal: Negative.  Negative for myalgias, joint pain and falls.  Skin: Negative.  Negative for itching and rash.  Neurological: Negative.  Negative for dizziness, tingling, tremors, sensory change, speech change, focal weakness, seizures, loss of consciousness, weakness and headaches.  Endo/Heme/Allergies: Negative.  Does not bruise/bleed easily.  Psychiatric/Behavioral: Negative.  Negative for depression, suicidal ideas, memory loss and substance abuse. The patient is not nervous/anxious and does not have insomnia.   All other systems reviewed and are negative.  14 point ROS was done and is otherwise as detailed above or in HPI   PHYSICAL EXAMINATION: ECOG PERFORMANCE STATUS: 1 - Symptomatic but completely ambulatory   Vitals with BMI 02/14/2016  Height   Weight 111 lbs 5 oz  BMI   Systolic 287  Diastolic 65  Pulse 80  Respirations 18    Physical Exam  Constitutional: She is  oriented to person, place, and time and well-developed, well-nourished, and in no distress. Appears fatigued.  HENT:  Head: Normocephalic and atraumatic.  Nose: Nose normal.  Mouth/Throat: Oropharynx is clear and moist. No oropharyngeal exudate.  Eyes: Conjunctivae and EOM are normal. Pupils are equal, round, and reactive to light. Right eye exhibits no discharge. Left eye exhibits no discharge. No scleral icterus.  Neck: Normal range of motion. Neck supple. No tracheal deviation present. No thyromegaly present.  Cardiovascular: Normal rate, regular rhythm and normal heart sounds.  Exam reveals no gallop and no friction rub.   No murmur heard. Pulmonary/Chest: Effort normal and breath sounds normal. She has no wheezes. She has no rales.  Abdominal: Soft. Bowel sounds are normal. She exhibits no distension and no mass. There is no tenderness. There is no rebound and no guarding.  Musculoskeletal: Normal range of motion. She exhibits no edema.  Lymphadenopathy:    She has no cervical adenopathy.  Neurological: She is alert and oriented to person, place, and time. She has normal reflexes. No cranial nerve deficit. Gait normal. Coordination normal.  Skin: Skin is warm and dry. No rash noted.  Psychiatric: Mood, memory, affect and judgment normal.  Nursing note and vitals reviewed.   LABORATORY DATA:  I have reviewed the data as listed  Results for Janet, Fletcher (MRN [id number]) as of 03/08/2016 09:36  Ref. Range 02/14/2016 09:15  Sodium Latest Ref Range: 135 - 145 mmol/L 135  Potassium Latest Ref Range: 3.5 - 5.1 mmol/L 5.0  Chloride Latest Ref Range: 101 - 111 mmol/L 106  CO2 Latest Ref Range: 22 - 32 mmol/L 26  BUN Latest Ref Range: 6 - 20 mg/dL 18  Creatinine Latest Ref Range: 0.44 - 1.00 mg/dL 1.22 (H)  Calcium Latest Ref Range: 8.9 - 10.3 mg/dL 8.5 (L)  EGFR (Non-African Amer.) Latest Ref Range: >60 mL/min 42 (L)  EGFR (African American) Latest Ref Range: >60 mL/min 48 (L)  Glucose  Latest Ref Range: 65 - 99 mg/dL 100 (H)  Anion gap Latest Ref Range: 5 - 15  3 (L)  Magnesium Latest Ref Range: 1.7 - 2.4 mg/dL 1.7  Alkaline Phosphatase Latest Ref Range: 38 - 126 U/L 66  Albumin Latest Ref Range: 3.5 - 5.0 g/dL 3.1 (L)  AST Latest Ref Range: 15 - 41 U/L 20  ALT Latest Ref Range: 14 -  54 U/L 8 (L)  Total Protein Latest Ref Range: 6.5 - 8.1 g/dL 6.1 (L)  Total Bilirubin Latest Ref Range: 0.3 - 1.2 mg/dL 0.3  WBC Latest Ref Range: 4.0 - 10.5 K/uL 5.9  RBC Latest Ref Range: 3.87 - 5.11 MIL/uL 3.90  Hemoglobin Latest Ref Range: 12.0 - 15.0 g/dL 10.8 (L)  HCT Latest Ref Range: 36.0 - 46.0 % 33.0 (L)  MCV Latest Ref Range: 78.0 - 100.0 fL 84.6  MCH Latest Ref Range: 26.0 - 34.0 pg 27.7  MCHC Latest Ref Range: 30.0 - 36.0 g/dL 32.7  RDW Latest Ref Range: 11.5 - 15.5 % 20.2 (H)  Platelets Latest Ref Range: 150 - 400 K/uL 133 (L)  Neutrophils Latest Units: % 85  Lymphocytes Latest Units: % 4  Monocytes Relative Latest Units: % 11  Eosinophil Latest Units: % 0  Basophil Latest Units: % 0  NEUT# Latest Ref Range: 1.7 - 7.7 K/uL 4.9  Lymphocyte # Latest Ref Range: 0.7 - 4.0 K/uL 0.3 (L)  Monocyte # Latest Ref Range: 0.1 - 1.0 K/uL 0.7  Eosinophils Absolute Latest Ref Range: 0.0 - 0.7 K/uL 0.0  Basophils Absolute Latest Ref Range: 0.0 - 0.1 K/uL 0.0    RADIOGRAPHIC STUDIES: I have personally reviewed the radiological images as listed and agreed with the findings in the report. No results found.  CLINICAL DATA:  80 year old female with lung cancer. Staging. Initial encounter.  EXAM: MRI HEAD WITHOUT AND WITH CONTRAST  TECHNIQUE: Multiplanar, multiecho pulse sequences of the brain and surrounding structures were obtained without and with intravenous contrast.  CONTRAST:  45m MULTIHANCE GADOBENATE DIMEGLUMINE 529 MG/ML IV SOLN  COMPARISON:  None.  FINDINGS: No intracranial parenchymal enhancing lesion. Tiny right frontal calvarial enhancing lesion (series 10  image 61). Small metastatic lesion not excluded although this may represent an incidentally detected benign process.  No acute infarct or intracranial hemorrhage.  Mild chronic microvascular changes.  Mild global atrophy without hydrocephalus.  Major intracranial vascular structures are patent.  Post lens replacement otherwise orbital structures unremarkable.  IMPRESSION: No intracranial parenchymal enhancing lesion.  Tiny right frontal calvarial enhancing lesion (series 10 image 61). Small metastatic lesion not excluded although this may represent an incidentally detected benign process.  Mild chronic microvascular changes.   Electronically Signed   By: SGenia DelM.D.   On: 12/25/2015 20:39     PATHOLOGY    ASSESSMENT & PLAN:  Limited stage small cell lung cancer Tobacco Abuse Anemia Low B12 Iron deficiency Chemotherapy induced pancyopenia Chemotherapy induced fatigue   The patient is here for Cycle #4 Cisplatin / Etoposide. She had marked pancytopenia after cycle #3. She has been dose reduced for this cycle; however given weight loss, fatigue I have recommended delaying therapy X 1 week. She just completed XRT.   Advised for the patient to discontinue potassium supplements at this time. The patient does not want pain medication at this time. She does not need any refills at this time.  She met with dietician, Janet Fletcher today.  I spoke with the patient's grandson on the telephone during our visit today.  She will return for treatment and follow up next week.   All questions were answered. The patient knows to call the clinic with any problems, questions or concerns.  This document serves as a record of services personally performed by SAncil Linsey MD. It was created on her behalf by EArlyce Harman a trained medical scribe. The creation of this record is based on the scribe's  personal observations and the provider's statements to them. This  document has been checked and approved by the attending provider.  I have reviewed the above documentation for accuracy and completeness, and I agree with the above.  This note was electronically signed.  Molli Hazard, MD  02/14/2016 9:41 AM

## 2016-02-15 ENCOUNTER — Inpatient Hospital Stay (HOSPITAL_COMMUNITY): Payer: Commercial Managed Care - HMO

## 2016-02-16 ENCOUNTER — Inpatient Hospital Stay (HOSPITAL_COMMUNITY): Payer: Commercial Managed Care - HMO

## 2016-02-16 ENCOUNTER — Encounter (HOSPITAL_COMMUNITY): Payer: Self-pay

## 2016-02-21 ENCOUNTER — Encounter (HOSPITAL_COMMUNITY): Payer: Commercial Managed Care - HMO | Attending: Hematology & Oncology

## 2016-02-21 VITALS — BP 106/78 | HR 87 | Temp 97.8°F | Resp 20 | Wt 114.0 lb

## 2016-02-21 DIAGNOSIS — Z9889 Other specified postprocedural states: Secondary | ICD-10-CM | POA: Diagnosis not present

## 2016-02-21 DIAGNOSIS — C3491 Malignant neoplasm of unspecified part of right bronchus or lung: Secondary | ICD-10-CM | POA: Diagnosis not present

## 2016-02-21 DIAGNOSIS — Z5111 Encounter for antineoplastic chemotherapy: Secondary | ICD-10-CM | POA: Diagnosis not present

## 2016-02-21 DIAGNOSIS — Z888 Allergy status to other drugs, medicaments and biological substances status: Secondary | ICD-10-CM | POA: Diagnosis not present

## 2016-02-21 DIAGNOSIS — M8580 Other specified disorders of bone density and structure, unspecified site: Secondary | ICD-10-CM | POA: Insufficient documentation

## 2016-02-21 DIAGNOSIS — E785 Hyperlipidemia, unspecified: Secondary | ICD-10-CM | POA: Diagnosis not present

## 2016-02-21 DIAGNOSIS — C3490 Malignant neoplasm of unspecified part of unspecified bronchus or lung: Secondary | ICD-10-CM | POA: Insufficient documentation

## 2016-02-21 DIAGNOSIS — D649 Anemia, unspecified: Secondary | ICD-10-CM | POA: Diagnosis not present

## 2016-02-21 DIAGNOSIS — F17210 Nicotine dependence, cigarettes, uncomplicated: Secondary | ICD-10-CM | POA: Insufficient documentation

## 2016-02-21 LAB — COMPREHENSIVE METABOLIC PANEL
ALBUMIN: 3.1 g/dL — AB (ref 3.5–5.0)
ALK PHOS: 58 U/L (ref 38–126)
ALT: 13 U/L — ABNORMAL LOW (ref 14–54)
ANION GAP: 4 — AB (ref 5–15)
AST: 23 U/L (ref 15–41)
BILIRUBIN TOTAL: 0.3 mg/dL (ref 0.3–1.2)
BUN: 31 mg/dL — ABNORMAL HIGH (ref 6–20)
CALCIUM: 8.5 mg/dL — AB (ref 8.9–10.3)
CO2: 26 mmol/L (ref 22–32)
Chloride: 106 mmol/L (ref 101–111)
Creatinine, Ser: 1.15 mg/dL — ABNORMAL HIGH (ref 0.44–1.00)
GFR calc non Af Amer: 45 mL/min — ABNORMAL LOW (ref >60)
GFR, EST AFRICAN AMERICAN: 52 mL/min — AB (ref >60)
GLUCOSE: 128 mg/dL — AB (ref 65–99)
POTASSIUM: 4.1 mmol/L (ref 3.5–5.1)
SODIUM: 136 mmol/L (ref 135–145)
TOTAL PROTEIN: 6.1 g/dL — AB (ref 6.5–8.1)

## 2016-02-21 LAB — CBC WITH DIFFERENTIAL/PLATELET
BASOS ABS: 0 10*3/uL (ref 0.0–0.1)
BASOS PCT: 0 %
EOS PCT: 0 %
Eosinophils Absolute: 0 10*3/uL (ref 0.0–0.7)
HCT: 32.1 % — ABNORMAL LOW (ref 36.0–46.0)
Hemoglobin: 10.4 g/dL — ABNORMAL LOW (ref 12.0–15.0)
Lymphocytes Relative: 8 %
Lymphs Abs: 0.4 10*3/uL — ABNORMAL LOW (ref 0.7–4.0)
MCH: 27.7 pg (ref 26.0–34.0)
MCHC: 32.4 g/dL (ref 30.0–36.0)
MCV: 85.4 fL (ref 78.0–100.0)
MONO ABS: 0.6 10*3/uL (ref 0.1–1.0)
MONOS PCT: 13 %
Neutro Abs: 3.8 10*3/uL (ref 1.7–7.7)
Neutrophils Relative %: 79 %
PLATELETS: 196 10*3/uL (ref 150–400)
RBC: 3.76 MIL/uL — ABNORMAL LOW (ref 3.87–5.11)
RDW: 20.2 % — AB (ref 11.5–15.5)
WBC: 4.8 10*3/uL (ref 4.0–10.5)

## 2016-02-21 LAB — MAGNESIUM: Magnesium: 1.9 mg/dL (ref 1.7–2.4)

## 2016-02-21 MED ORDER — POTASSIUM CHLORIDE 2 MEQ/ML IV SOLN
Freq: Once | INTRAVENOUS | Status: AC
  Administered 2016-02-21: 11:00:00 via INTRAVENOUS
  Filled 2016-02-21: qty 10

## 2016-02-21 MED ORDER — SODIUM CHLORIDE 0.9 % IV SOLN
Freq: Once | INTRAVENOUS | Status: AC
  Administered 2016-02-21: 10:00:00 via INTRAVENOUS

## 2016-02-21 MED ORDER — HEPARIN SOD (PORK) LOCK FLUSH 100 UNIT/ML IV SOLN
500.0000 [IU] | Freq: Once | INTRAVENOUS | Status: AC | PRN
  Administered 2016-02-21: 500 [IU]
  Filled 2016-02-21: qty 5

## 2016-02-21 MED ORDER — SODIUM CHLORIDE 0.9% FLUSH
10.0000 mL | INTRAVENOUS | Status: DC | PRN
  Administered 2016-02-21: 10 mL
  Filled 2016-02-21: qty 10

## 2016-02-21 MED ORDER — PALONOSETRON HCL INJECTION 0.25 MG/5ML
0.2500 mg | Freq: Once | INTRAVENOUS | Status: AC
  Administered 2016-02-21: 0.25 mg via INTRAVENOUS
  Filled 2016-02-21: qty 5

## 2016-02-21 MED ORDER — SODIUM CHLORIDE 0.9 % IV SOLN
90.0000 mg/m2 | Freq: Once | INTRAVENOUS | Status: AC
  Administered 2016-02-21: 140 mg via INTRAVENOUS
  Filled 2016-02-21: qty 7

## 2016-02-21 MED ORDER — SODIUM CHLORIDE 0.9 % IV SOLN
Freq: Once | INTRAVENOUS | Status: AC
  Administered 2016-02-21: 13:00:00 via INTRAVENOUS
  Filled 2016-02-21: qty 5

## 2016-02-21 MED ORDER — SODIUM CHLORIDE 0.9 % IV SOLN
64.0000 mg/m2 | Freq: Once | INTRAVENOUS | Status: AC
  Administered 2016-02-21: 100 mg via INTRAVENOUS
  Filled 2016-02-21: qty 100

## 2016-02-21 NOTE — Progress Notes (Signed)
Last progress note entry was an error wrong chart  Janet Fletcher tolerated chemo tx well without complaints. Pt discharged self ambulatory in satisfactory condition with family. Pt and daughter instructed to restart Potassium pills every other day and both verbalized understanding

## 2016-02-21 NOTE — Patient Instructions (Addendum)
[place] at [facility] Discharge Instructions  RECOMMENDATIONS MADE BY THE CONSULTANT AND ANY TEST RESULTS WILL BE SENT TO YOUR REFERRING [place]  Thank you for choosing [place] at [facility] to provide your oncology and hematology care.  To afford each patient quality time with our provider, please arrive at least 15 minutes before your scheduled appointment time.   Beginning January 23rd 2017 lab work for the [facility] will be done in the  Main lab at [facility] on 1st floor. If you have a lab appointment with the [facility] please come in thru the  Main Entrance and check in at the main information desk  You need to re-schedule your appointment should you arrive 10 or more minutes late.  We strive to give you quality time with our providers, and arriving late affects you and other patients whose appointments are after yours.  Also, if you no show three or more times for appointments you may be dismissed from the clinic at the providers discretion.     Again, thank you for choosing [facility]  Our hope is that these requests will decrease the amount of time that you wait before being seen by our physicians.       _____________________________________________________________  Should you have questions after your visit to [facility], please contact our office at [phone] between the hours of 8:30 a.m. and 4:30 p.m.  Voicemails left after 4:30 p.m. will not be returned until the following business day.  For prescription refill requests, have your pharmacy contact our office.         Resources For Cancer Patients and their Caregivers ? American Cancer Society: Can assist with transportation, wigs, general needs, runs Look Good Feel Better.        502-613-0771 ? Cancer Care: Provides financial assistance, online support groups, medication/co-pay assistance.  1-800-813-HOPE (520) 592-2203) ? [place] Assists [place] cancer patients and their families through emotional , educational and financial support.  (323)271-6041 ? [place] DSS Where to apply for food stamps, Medicaid and utility assistance. 281-166-9068 ? RCATS: Transportation to medical appointments. 865-257-8632 ? Social Security Administration: May apply for disability if have a Stage IV cancer. 479-401-3901 (531) 311-6465 ? [employer], Disability and Transit Services: Assists with nutrition, care and transit needs. [place] Support Programs: '@10RELATIVEDAYS'$ @ > Cancer Support Group  2nd Tuesday of the month 1pm-2pm, Journey Room  > Creative Journey  3rd Tuesday of the month 1130am-1pm, Journey Room  > Look Good Feel Better  1st Wednesday of the month 10am-12 noon, Journey Room (Call American Cancer Society to register [phone])  [place] Discharge Instructions for Patients Receiving Chemotherapy   Beginning January 23rd 2017 lab work for the [facility] will be done in the  Main lab at [facility] on 1st floor. If you have a lab appointment with the [facility] please come in thru the  Main Entrance and check in at the main information desk   Today you received the following chemotherapy agents Cisplatin and VP-16. Start back taking Potassium pill every other day. Follow-up as scheduled. Call clinic for any questions or concerns  To help prevent nausea and vomiting after your treatment, we encourage you to take your nausea medication   If you develop nausea and vomiting, or diarrhea that is not controlled by your medication, call the clinic.  The clinic phone  number is [phone]. Office hours are Monday-Friday 8:30am-5:00pm.  BELOW ARE SYMPTOMS THAT SHOULD BE REPORTED IMMEDIATELY:  *FEVER GREATER THAN 101.0 F  *CHILLS WITH OR WITHOUT FEVER  NAUSEA AND VOMITING THAT IS NOT CONTROLLED WITH YOUR NAUSEA  MEDICATION  *UNUSUAL SHORTNESS OF BREATH  *UNUSUAL BRUISING OR BLEEDING  TENDERNESS IN MOUTH AND THROAT WITH OR WITHOUT PRESENCE OF ULCERS  *URINARY PROBLEMS  *BOWEL PROBLEMS  UNUSUAL RASH Items with * indicate a potential emergency and should be followed up as soon as possible. If you have an emergency after office hours please contact your primary care physician or go to the nearest emergency department.  Please call the clinic during office hours if you have any questions or concerns.   You may also contact the Patient Navigator at [phone] should you have any questions or need assistance in obtaining follow up care.      Resources For Cancer Patients and their Caregivers ? American Cancer Society: Can assist with transportation, wigs, general needs, runs Look Good Feel Better.        417-055-2069 ? Cancer Care: Provides financial assistance, online support groups, medication/co-pay assistance.  1-800-813-HOPE (480)827-5633) ? [place] Assists [place] cancer patients and their families through emotional , educational and financial support.  323-077-3065 ? [place] DSS Where to apply for food stamps, Medicaid and utility assistance. 832-180-9023 ? RCATS: Transportation to medical appointments. 307 636 3678 ? Social Security Administration: May apply for disability if have a Stage IV cancer. [phone] 715-869-9186 ? [employer], Disability and Transit Services: Assists with nutrition, care and transit needs. ([phone])[phone]

## 2016-02-22 ENCOUNTER — Encounter (HOSPITAL_COMMUNITY): Payer: Self-pay | Admitting: Oncology

## 2016-02-22 ENCOUNTER — Encounter (HOSPITAL_BASED_OUTPATIENT_CLINIC_OR_DEPARTMENT_OTHER): Payer: Commercial Managed Care - HMO

## 2016-02-22 ENCOUNTER — Encounter (HOSPITAL_BASED_OUTPATIENT_CLINIC_OR_DEPARTMENT_OTHER): Payer: Commercial Managed Care - HMO | Admitting: Oncology

## 2016-02-22 DIAGNOSIS — Z720 Tobacco use: Secondary | ICD-10-CM

## 2016-02-22 DIAGNOSIS — C3491 Malignant neoplasm of unspecified part of right bronchus or lung: Secondary | ICD-10-CM

## 2016-02-22 DIAGNOSIS — Z5111 Encounter for antineoplastic chemotherapy: Secondary | ICD-10-CM | POA: Diagnosis not present

## 2016-02-22 MED ORDER — SODIUM CHLORIDE 0.9 % IV SOLN
90.0000 mg/m2 | Freq: Once | INTRAVENOUS | Status: AC
  Administered 2016-02-22: 140 mg via INTRAVENOUS
  Filled 2016-02-22: qty 7

## 2016-02-22 MED ORDER — HEPARIN SOD (PORK) LOCK FLUSH 100 UNIT/ML IV SOLN
500.0000 [IU] | Freq: Once | INTRAVENOUS | Status: AC | PRN
  Administered 2016-02-22: 500 [IU]
  Filled 2016-02-22: qty 5

## 2016-02-22 MED ORDER — SODIUM CHLORIDE 0.9% FLUSH
10.0000 mL | INTRAVENOUS | Status: DC | PRN
  Administered 2016-02-22: 10 mL
  Filled 2016-02-22: qty 10

## 2016-02-22 MED ORDER — SODIUM CHLORIDE 0.9 % IV SOLN
10.0000 mg | Freq: Once | INTRAVENOUS | Status: AC
  Administered 2016-02-22: 10 mg via INTRAVENOUS
  Filled 2016-02-22: qty 1

## 2016-02-22 MED ORDER — SODIUM CHLORIDE 0.9 % IV SOLN
Freq: Once | INTRAVENOUS | Status: AC
  Administered 2016-02-22: 09:00:00 via INTRAVENOUS

## 2016-02-22 NOTE — Patient Instructions (Signed)
[facility] Discharge Instructions for Patients Receiving Chemotherapy   Beginning January 23rd 2017 lab work for the Cancer Center will be done in the  Main lab at [place] on 1st floor. If you have a lab appointment with the Cancer Center please come in thru the  Main Entrance and check in at the main information desk   Today you received the following chemotherapy agents VP-16. Follow-up as scheduled. Call clinic for any questions or concerns  To help prevent nausea and vomiting after your treatment, we encourage you to take your nausea medication   If you develop nausea and vomiting, or diarrhea that is not controlled by your medication, call the clinic.  The clinic phone number is [phone]. Office hours are Monday-Friday 8:30am-5:00pm.  BELOW ARE SYMPTOMS THAT SHOULD BE REPORTED IMMEDIATELY:  *FEVER GREATER THAN 101.0 F  *CHILLS WITH OR WITHOUT FEVER  NAUSEA AND VOMITING THAT IS NOT CONTROLLED WITH YOUR NAUSEA MEDICATION  *UNUSUAL SHORTNESS OF BREATH  *UNUSUAL BRUISING OR BLEEDING  TENDERNESS IN MOUTH AND THROAT WITH OR WITHOUT PRESENCE OF ULCERS  *URINARY PROBLEMS  *BOWEL PROBLEMS  UNUSUAL RASH Items with * indicate a potential emergency and should be followed up as soon as possible. If you have an emergency after office hours please contact your primary care physician or go to the nearest emergency department.  Please call the clinic during office hours if you have any questions or concerns.   You may also contact the Patient Navigator at [phone] should you have any questions or need assistance in obtaining follow up care.      Resources For Cancer Patients and their Caregivers ? American Cancer Society: Can assist with transportation, wigs, general needs, runs Look Good Feel Better.        1-888-227-6333 ? Cancer Care: Provides financial assistance, online support groups, medication/co-pay assistance.  1-800-813-HOPE  (4673) ? [facility] Assists [place] cancer patients and their families through emotional , educational and financial support.  336-427-4357 ? [place] DSS Where to apply for food stamps, Medicaid and utility assistance. 336-342-1394 ? RCATS: Transportation to medical appointments. 336-347-2287 ? Social Security Administration: May apply for disability if have a Stage IV cancer. 336-342-7796 1-800-772-1213 ? [place] Aging, Disability and Transit Services: Assists with nutrition, care and transit needs. [phone]         

## 2016-02-22 NOTE — Progress Notes (Signed)
[address], [place] [address]  Small cell carcinoma of right lung [facility])  CURRENT THERAPY: Cisplatin/Etoposide beginning on 12/13/2015.  Also undergoing right thoracic XRT.  INTERVAL HISTORY: Janet Fletcher 80 y.o. 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    Small cell carcinoma of right lung ([place])   11/28/2015 Imaging    Large anterior mediastinal mass in continuity with a R hilar mass, narrowing of SVC, abnormal densities in RUL, 9 mm spiculated nodule, nonspec. hypodensity in liver     12/05/2015 PET scan    Large hypermetabolic paratracheal mass c/w SCLC, perihilar nodular densities in RML, mild metabolic activity RUL nodule, no distant metastatic disease     12/11/2015 Procedure    Video bronch with biopsies and brushings, endobronchial ultrasound with mediastinal LN aspiration. Dr. Roxan Hockey     12/11/2015 Pathology Results    Trachea biopsy negative, FNA RUL malignant cells c/w SCLC     12/13/2015 -  Chemotherapy    Cisplatin/Etoposide     12/25/2015 Imaging    MRI brain- No intracranial parenchymal enhancing lesion. Tiny right frontal calvarial enhancing lesion. Small metastatic lesion not excluded although this may represent an incidentally detected benign process.     12/29/2015 - 02/13/2016 Radiation Therapy    XRT     02/14/2016 Treatment Plan Change    Chemotherapy deferred x 1 week     02/21/2016 Treatment Plan Change    Carboplatin dose reduced by 20% and Etoposide dose reduced by 10%.     She started chemotherapy yesterday.  Thus far, no issues.  Her weight is up to 120 lbs.  She notes that this AM, she had 2 bottles of H2O and 2 ensures to drink.  Unfortunately, her nephew was tragically murdered by knife stab wounds in a Art gallery [employer] shop.  This is being investigated.  His body is being released to the family tomorrow for planning of funeral services.  Family is grief stricken.  Review  of Systems  Constitutional: Negative for chills, fever, malaise/fatigue and weight loss.  HENT: Negative.  Negative for sore throat.   Eyes: Negative.   Respiratory: Negative.  Negative for cough, sputum production and shortness of breath.   Cardiovascular: Negative.   Gastrointestinal: Negative.  Negative for nausea and vomiting.  Genitourinary: Negative.  Negative for dysuria.  Musculoskeletal: Negative.  Negative for falls.  Skin: Negative.   Neurological: Negative.  Negative for dizziness and weakness.  Endo/Heme/Allergies: Negative.   Psychiatric/Behavioral: Negative.     Past Medical History:  Diagnosis Date  . Allergy   . Cancer ([place])    lung  . Hyperlipidemia   . Iron deficiency anemia 12/15/2015  . Low vitamin B12 level 12/15/2015  . Osteopenia     Past Surgical History:  Procedure Laterality Date  . CATARACT EXTRACTION, BILATERAL Bilateral   . MINOR EXCISION EAR CANAL CYST Left    30+ years ago  . SMALL INTESTINE SURGERY    . VIDEO BRONCHOSCOPY WITH ENDOBRONCHIAL ULTRASOUND N/A 12/11/2015   Procedure: VIDEO BRONCHOSCOPY WITH ENDOBRONCHIAL ULTRASOUND;  Surgeon: Melrose Nakayama, MD;  Location: [facility] OR;  Service: Thoracic;  Laterality: N/A;    Family History  Problem Relation Age of Onset  . Cancer Mother   . Hypertension Father     Social History   Social History  . Marital status: Married    Spouse name: N/A  . Number of children: N/A  .  Years of education: N/A   Social History Main Topics  . Smoking status: Current Every Day Smoker    Packs/day: 0.25    Years: 50.00  . Smokeless tobacco: Never Used  . Alcohol use No  . Drug use: No  . Sexual activity: Not Asked   Other Topics Concern  . None   Social History Narrative  . None     PHYSICAL EXAMINATION  ECOG PERFORMANCE STATUS: 1 - Symptomatic but completely ambulatory  Vitals:   02/22/16 0853  BP: 120/60  Pulse: 80  Resp: 18  Temp: 97.5 F (36.4 C)    GENERAL:alert, no distress,  well nourished, well developed, comfortable, cooperative, smiling and accompanied by her 2 daughters. SKIN: skin color, texture, turgor are normal, no rashes or significant lesions HEAD: Normocephalic, No masses, lesions, tenderness or abnormalities EYES: normal, EOMI, Conjunctiva are pink and non-injected EARS: External ears normal OROPHARYNX:lips, buccal mucosa, and tongue normal and mucous membranes are moist  NECK: supple, thyroid normal size, non-tender, without nodularity, trachea midline LYMPH:  no palpable lymphadenopathy BREAST:not examined LUNGS: clear to auscultation and percussion HEART: regular rate & rhythm, no murmurs, no gallops, S1 normal and S2 normal ABDOMEN:abdomen soft, non-tender and normal bowel sounds BACK: Back symmetric, no curvature. EXTREMITIES:less then 2 second capillary refill, no joint deformities, effusion, or inflammation, no skin discoloration, no cyanosis  NEURO: alert & oriented x 3 with fluent speech, no focal motor/sensory deficits, gait normal   LABORATORY DATA: CBC    Component Value Date/Time   WBC 4.8 02/21/2016 0910   RBC 3.76 (L) 02/21/2016 0910   HGB 10.4 (L) 02/21/2016 0910   HCT 32.1 (L) 02/21/2016 0910   PLT 196 02/21/2016 0910   MCV 85.4 02/21/2016 0910   MCV 71.9 (A) 08/20/2013 1559   MCH 27.7 02/21/2016 0910   MCHC 32.4 02/21/2016 0910   RDW 20.2 (H) 02/21/2016 0910   LYMPHSABS 0.4 (L) 02/21/2016 0910   MONOABS 0.6 02/21/2016 0910   EOSABS 0.0 02/21/2016 0910   BASOSABS 0.0 02/21/2016 0910      Chemistry      Component Value Date/Time   NA 136 02/21/2016 0910   NA 143 09/29/2014 1203   K 4.1 02/21/2016 0910   CL 106 02/21/2016 0910   CO2 26 02/21/2016 0910   BUN 31 (H) 02/21/2016 0910   BUN 12 09/29/2014 1203   CREATININE 1.15 (H) 02/21/2016 0910   CREATININE 0.76 01/06/2013 1428      Component Value Date/Time   CALCIUM 8.5 (L) 02/21/2016 0910   ALKPHOS 58 02/21/2016 0910   AST 23 02/21/2016 0910   ALT 13 (L)  02/21/2016 0910   BILITOT 0.3 02/21/2016 0910   BILITOT 0.4 09/29/2014 1203     Lab Results  Component Value Date   FERRITIN 1,028 (H) 02/05/2016    PENDING LABS:   RADIOGRAPHIC STUDIES:  Dg Chest 2 View  Result Date: 01/31/2016 CLINICAL DATA:  Lung cancer with generalized weakness. EXAM: CHEST  2 VIEW COMPARISON:  12/11/2015 FINDINGS: The lungs are clear wiithout focal pneumonia, edema, pneumothorax or pleural effusion. Hyperexpansion is consistent with emphysema. Symmetric nodular densities projecting over each lower lung are compatible with nipple shadows. The cardiopericardial silhouette is within normal limits for size. The visualized bony structures of the thorax are intact. Telemetry leads overlie the chest. IMPRESSION: No active cardiopulmonary disease. Electronically Signed   By: Misty Stanley M.D.   On: 01/31/2016 12:53     PATHOLOGY:    ASSESSMENT AND  PLAN:  Small cell carcinoma of right lung (HCC) Limited stage small cell lung cancer (right) began systemic chemotherapy on 12/13/2015 with curative intent Cisplatin/Etoposide.  Currently receiving thoracic XRT as well.  Oncology history is updated.  For patients with LS-SCLC treated with contemporary chemoradiotherapy and prophylactic cranial irradiation, overall response rates of 80 to 90 percent, including 50 to 60 percent complete response rates, are typically reported. Median survival is around 17 months, and the five-year survival rate is about 20 percent.  Labs on 02/21/2016 CBC diff, CMET, Magnesium.  I personally reviewed and went over laboratory results with the patient.  The results are noted within this dictation.  She is currently taking K+ QOD.  Labs met treatment parameters on 02/21/2016 and therefore, she started cycle #4 on 02/21/2016.  Today is Day 2 of Cycle 4.  Weight is up to 120 lbs.  Her BP is stable.  Vitals are good.  Return in 7-14 days for follow-up and tolerance evaluation and return in 3 weeks for  follow-up and next cycle of chemotherapy.  If this cycle of chemotherapy goes well, will recommend 2 more cycles of chemotherapy.   ORDERS PLACED FOR THIS ENCOUNTER: No orders of the defined types were placed in this encounter.   MEDICATIONS PRESCRIBED THIS ENCOUNTER: No orders of the defined types were placed in this encounter.   THERAPY PLAN:  Continue treatment as planned with curative intent.  All questions were answered. The patient knows to call the clinic with any problems, questions or concerns. We can certainly see the patient much sooner if necessary.  Patient and plan discussed with Dr. Ancil Linsey and she is in agreement with the aforementioned.   This note is electronically signed by: Robynn Pane, PA-C 02/22/2016 9:30 AM

## 2016-02-22 NOTE — Progress Notes (Signed)
Janet Fletcher Mole tolerated chemo tx well without complaints. Port left accessed for use tomorrow. Pt discharged self ambulatory in satisfactory condition with daughter

## 2016-02-22 NOTE — Patient Instructions (Signed)
[place] at [facility] Discharge Instructions  RECOMMENDATIONS MADE BY THE CONSULTANT AND ANY TEST RESULTS WILL BE SENT TO YOUR REFERRING PHYSICIAN.  Return in 2 weeks for follow up. Return in 3 weeks for follow up.  No labs.   Thank you for choosing [place] at [facility] to provide your oncology and hematology care.  To afford each patient quality time with our provider, please arrive at least 15 minutes before your scheduled appointment time.   Beginning January 23rd 2017 lab work for the [facility] will be done in the  Main lab at [facility] on 1st floor. If you have a lab appointment with the [place] please come in thru the  Main Entrance and check in at the main information desk  You need to re-schedule your appointment should you arrive 10 or more minutes late.  We strive to give you quality time with our providers, and arriving late affects you and other patients whose appointments are after yours.  Also, if you no show three or more times for appointments you may be dismissed from the clinic at the providers discretion.     Again, thank you for choosing [facility].  Our hope is that these requests will decrease the amount of time that you wait before being seen by our physicians.       _____________________________________________________________  Should you have questions after your visit to [facility], please contact our office at [phone] between the hours of 8:30 a.m. and 4:30 p.m.  Voicemails left after 4:30 p.m. will not be returned until the following business day.  For prescription refill requests, have your pharmacy contact our office.         Resources For Cancer Patients and their Caregivers ? American Cancer Society: Can assist with transportation, wigs, general needs, runs Look Good Feel Better.        [phone] ? Cancer Care: Provides financial assistance, online  support groups, medication/co-pay assistance.  1-800-813-HOPE (475)265-5926) ? [place] Assists [place] cancer patients and their families through emotional , educational and financial support.  4255984508 ? [place] DSS Where to apply for food stamps, Medicaid and utility assistance. 434-045-7401 ? RCATS: Transportation to medical appointments. 339-871-6018 ? Social Security Administration: May apply for disability if have a Stage IV cancer. [phone] [phone] ? [employer], Disability and Transit Services: Assists with nutrition, care and transit needs. [place] Support Programs: '@10RELATIVEDAYS'$ @ > Cancer Support Group  2nd Tuesday of the month 1pm-2pm, Journey Room  > Creative Journey  3rd Tuesday of the month 1130am-1pm, Journey Room  > Look Good Feel Better  1st Wednesday of the month 10am-12 noon, Journey Room (Call [place] to register [phone])

## 2016-02-22 NOTE — Assessment & Plan Note (Addendum)
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  Currently receiving thoracic XRT as well.  Oncology history is updated.  For patients with LS-SCLC treated with contemporary chemoradiotherapy and prophylactic cranial irradiation, overall response rates of 80 to 90 percent, including 50 to 60 percent complete response rates, are typically reported. Median survival is around 17 months, and the five-year survival rate is about 20 percent.  Labs on 02/21/2016 CBC diff, CMET, Magnesium.  I personally reviewed and went over laboratory results with the patient.  The results are noted within this dictation.  She is currently taking K+ QOD.  Labs met treatment parameters on 02/21/2016 and therefore, she started cycle #4 on 02/21/2016.  Today is Day 2 of Cycle 4.  Weight is up to 120 lbs.  Her BP is stable.  Vitals are good.  Return in 7-14 days for follow-up and tolerance evaluation and return in 3 weeks for follow-up and next cycle of chemotherapy.  If this cycle of chemotherapy goes well, will recommend 2 more cycles of chemotherapy to complete 6 full cycles of treatment.

## 2016-02-23 ENCOUNTER — Inpatient Hospital Stay (HOSPITAL_COMMUNITY): Payer: Commercial Managed Care - HMO

## 2016-02-23 ENCOUNTER — Encounter (HOSPITAL_BASED_OUTPATIENT_CLINIC_OR_DEPARTMENT_OTHER): Payer: Commercial Managed Care - HMO

## 2016-02-23 ENCOUNTER — Other Ambulatory Visit (HOSPITAL_COMMUNITY): Payer: Self-pay | Admitting: Oncology

## 2016-02-23 VITALS — BP 152/68 | HR 81 | Temp 97.7°F | Resp 16 | Wt 121.4 lb

## 2016-02-23 DIAGNOSIS — C3491 Malignant neoplasm of unspecified part of right bronchus or lung: Secondary | ICD-10-CM | POA: Diagnosis not present

## 2016-02-23 DIAGNOSIS — Z5111 Encounter for antineoplastic chemotherapy: Secondary | ICD-10-CM | POA: Diagnosis not present

## 2016-02-23 MED ORDER — SODIUM CHLORIDE 0.9 % IV SOLN
90.0000 mg/m2 | Freq: Once | INTRAVENOUS | Status: AC
  Administered 2016-02-23: 140 mg via INTRAVENOUS
  Filled 2016-02-23: qty 7

## 2016-02-23 MED ORDER — SODIUM CHLORIDE 0.9 % IV SOLN
Freq: Once | INTRAVENOUS | Status: AC
  Administered 2016-02-23: 08:00:00 via INTRAVENOUS

## 2016-02-23 MED ORDER — HEPARIN SOD (PORK) LOCK FLUSH 100 UNIT/ML IV SOLN
500.0000 [IU] | Freq: Once | INTRAVENOUS | Status: AC | PRN
  Administered 2016-02-23: 500 [IU]

## 2016-02-23 MED ORDER — SODIUM CHLORIDE 0.9% FLUSH
10.0000 mL | INTRAVENOUS | Status: DC | PRN
  Administered 2016-02-23: 10 mL
  Filled 2016-02-23: qty 10

## 2016-02-23 MED ORDER — PEGFILGRASTIM 6 MG/0.6ML ~~LOC~~ PSKT
6.0000 mg | PREFILLED_SYRINGE | Freq: Once | SUBCUTANEOUS | Status: AC
  Administered 2016-02-23: 6 mg via SUBCUTANEOUS

## 2016-02-23 MED ORDER — SODIUM CHLORIDE 0.9 % IV SOLN
10.0000 mg | Freq: Once | INTRAVENOUS | Status: AC
  Administered 2016-02-23: 10 mg via INTRAVENOUS
  Filled 2016-02-23: qty 1

## 2016-02-23 MED ORDER — PEGFILGRASTIM 6 MG/0.6ML ~~LOC~~ PSKT
PREFILLED_SYRINGE | SUBCUTANEOUS | Status: AC
  Filled 2016-02-23: qty 0.6

## 2016-02-23 NOTE — Patient Instructions (Signed)
[facility] Discharge Instructions for Patients Receiving Chemotherapy   Beginning January 23rd 2017 lab work for the [facility] will be done in the  Main lab at [facility] on 1st floor. If you have a lab appointment with the [place] please come in thru the  Main Entrance and check in at the main information desk   Today you received the following chemotherapy agents Day 3 VP16 completes Cycle 4. Neulasta OnPro will dispense medication between 12 noon and 1 pm tomorrow Saturday Aug 5th. You may remove device after 1 pm tomorrow. Return as scheduled.  To help prevent nausea and vomiting after your treatment, we encourage you to take your nausea medication as instructed. If you develop nausea and vomiting, or diarrhea that is not controlled by your medication, call the clinic.  The clinic phone number is [phone]. Office hours are Monday-Friday 8:30am-5:00pm.  BELOW ARE SYMPTOMS THAT SHOULD BE REPORTED IMMEDIATELY:  *FEVER GREATER THAN 101.0 F  *CHILLS WITH OR WITHOUT FEVER  NAUSEA AND VOMITING THAT IS NOT CONTROLLED WITH YOUR NAUSEA MEDICATION  *UNUSUAL SHORTNESS OF BREATH  *UNUSUAL BRUISING OR BLEEDING  TENDERNESS IN MOUTH AND THROAT WITH OR WITHOUT PRESENCE OF ULCERS  *URINARY PROBLEMS  *BOWEL PROBLEMS  UNUSUAL RASH Items with * indicate a potential emergency and should be followed up as soon as possible. If you have an emergency after office hours please contact your primary care physician or go to the nearest emergency department.  Please call the clinic during office hours if you have any questions or concerns.   You may also contact the Patient Navigator at [phone] should you have any questions or need assistance in obtaining follow up care.      Resources For Cancer Patients and their Caregivers ? American Cancer Society: Can assist with transportation, wigs, general needs, runs Look Good Feel Better.         215-739-5587 ? Cancer Care: Provides financial assistance, online support groups, medication/co-pay assistance.  1-800-813-HOPE (865) 413-8816) ? [place] Assists [place] cancer patients and their families through emotional , educational and financial support.  432-664-6254 ? [place] DSS Where to apply for food stamps, Medicaid and utility assistance. (786)809-6664 ? RCATS: Transportation to medical appointments. (732)596-8205 ? Social Security Administration: May apply for disability if have a Stage IV cancer. 802-707-0659 223-845-1062 ? [employer], Disability and Transit Services: Assists with nutrition, care and transit needs. [phone]

## 2016-02-23 NOTE — Progress Notes (Signed)
..  [name] arrived today for Janet Fletcher [facility] neulasta on body injector. See MAR for administration details. Injector in place and engaged with green light indicator on flashing. Tolerated application with out problems.  Tolerated chemo well. Ambulatory on discharge home with family.

## 2016-02-28 ENCOUNTER — Other Ambulatory Visit (HOSPITAL_COMMUNITY): Payer: Self-pay

## 2016-02-28 MED ORDER — MAGNESIUM CHLORIDE 64 MG PO TBEC: 1.0000 | DELAYED_RELEASE_TABLET | Freq: Three times a day (TID) | ORAL | 1 refills | Status: DC

## 2016-03-03 ENCOUNTER — Encounter (HOSPITAL_COMMUNITY): Payer: Self-pay | Admitting: Hematology & Oncology

## 2016-03-04 ENCOUNTER — Encounter (HOSPITAL_BASED_OUTPATIENT_CLINIC_OR_DEPARTMENT_OTHER): Payer: Commercial Managed Care - HMO

## 2016-03-04 VITALS — BP 108/59 | HR 83 | Temp 98.0°F | Resp 16

## 2016-03-04 DIAGNOSIS — E538 Deficiency of other specified B group vitamins: Secondary | ICD-10-CM | POA: Diagnosis not present

## 2016-03-04 MED ORDER — CYANOCOBALAMIN 1000 MCG/ML IJ SOLN
INTRAMUSCULAR | Status: AC
  Filled 2016-03-04: qty 1

## 2016-03-04 MED ORDER — CYANOCOBALAMIN 1000 MCG/ML IJ SOLN
1000.0000 ug | Freq: Once | INTRAMUSCULAR | Status: AC
  Administered 2016-03-04: 1000 ug via INTRAMUSCULAR

## 2016-03-04 NOTE — Patient Instructions (Signed)
[facility] at [facility] Discharge Instructions  RECOMMENDATIONS MADE BY THE CONSULTANT AND ANY TEST RESULTS WILL BE SENT TO YOUR REFERRING PHYSICIAN.  B12 today.    Thank you for choosing [facility] at [facility] to provide your oncology and hematology care.  To afford each patient quality time with our provider, please arrive at least 15 minutes before your scheduled appointment time.   Beginning January 23rd 2017 lab work for the Cancer Center will be done in the  Main lab at [place] on 1st floor. If you have a lab appointment with the Cancer Center please come in thru the  Main Entrance and check in at the main information desk  You need to re-schedule your appointment should you arrive 10 or more minutes late.  We strive to give you quality time with our providers, and arriving late affects you and other patients whose appointments are after yours.  Also, if you no show three or more times for appointments you may be dismissed from the clinic at the providers discretion.     Again, thank you for choosing [facility].  Our hope is that these requests will decrease the amount of time that you wait before being seen by our physicians.       _____________________________________________________________  Should you have questions after your visit to [facility], please contact our office at [phone] between the hours of 8:30 a.m. and 4:30 p.m.  Voicemails left after 4:30 p.m. will not be returned until the following business day.  For prescription refill requests, have your pharmacy contact our office.         Resources For Cancer Patients and their Caregivers ? American Cancer Society: Can assist with transportation, wigs, general needs, runs Look Good Feel Better.        1-888-227-6333 ? Cancer Care: Provides financial assistance, online support groups, medication/co-pay assistance.  1-800-813-HOPE  (4673) ? [facility] Assists [place] cancer patients and their families through emotional , educational and financial support.  336-427-4357 ? [place] DSS Where to apply for food stamps, Medicaid and utility assistance. 336-342-1394 ? RCATS: Transportation to medical appointments. 336-347-2287 ? Social Security Administration: May apply for disability if have a Stage IV cancer. 336-342-7796 1-800-772-1213 ? [place] Aging, Disability and Transit Services: Assists with nutrition, care and transit needs. 336-349-2343  Cancer Center Support Programs: @10RELATIVEDAYS@ > Cancer Support Group  2nd Tuesday of the month 1pm-2pm, Journey Room  > Creative Journey  3rd Tuesday of the month 1130am-1pm, Journey Room  > Look Good Feel Better  1st Wednesday of the month 10am-12 noon, Journey Room (Call American [employer] to register 1-800-395-5775)    

## 2016-03-04 NOTE — Progress Notes (Signed)
Janet Fletcher presents today for injection per MD orders. B12 1023mg administered IM in left Upper Arm. Administration without incident. Patient tolerated well.

## 2016-03-06 ENCOUNTER — Ambulatory Visit (HOSPITAL_COMMUNITY): Payer: Commercial Managed Care - HMO | Admitting: Oncology

## 2016-03-06 ENCOUNTER — Inpatient Hospital Stay (HOSPITAL_COMMUNITY): Payer: Commercial Managed Care - HMO

## 2016-03-07 ENCOUNTER — Inpatient Hospital Stay (HOSPITAL_COMMUNITY): Payer: Commercial Managed Care - HMO

## 2016-03-08 ENCOUNTER — Ambulatory Visit (HOSPITAL_COMMUNITY): Payer: Commercial Managed Care - HMO | Admitting: Oncology

## 2016-03-08 ENCOUNTER — Inpatient Hospital Stay (HOSPITAL_COMMUNITY): Payer: Commercial Managed Care - HMO

## 2016-03-08 ENCOUNTER — Encounter (HOSPITAL_COMMUNITY): Payer: Self-pay | Admitting: Hematology & Oncology

## 2016-03-13 ENCOUNTER — Encounter (HOSPITAL_BASED_OUTPATIENT_CLINIC_OR_DEPARTMENT_OTHER): Payer: Commercial Managed Care - HMO | Admitting: Hematology & Oncology

## 2016-03-13 ENCOUNTER — Encounter (HOSPITAL_COMMUNITY): Payer: Self-pay | Admitting: Hematology & Oncology

## 2016-03-13 ENCOUNTER — Encounter (HOSPITAL_BASED_OUTPATIENT_CLINIC_OR_DEPARTMENT_OTHER): Payer: Commercial Managed Care - HMO

## 2016-03-13 VITALS — BP 137/65 | HR 77 | Temp 97.5°F | Resp 16

## 2016-03-13 DIAGNOSIS — C3491 Malignant neoplasm of unspecified part of right bronchus or lung: Secondary | ICD-10-CM

## 2016-03-13 DIAGNOSIS — Z720 Tobacco use: Secondary | ICD-10-CM

## 2016-03-13 DIAGNOSIS — D649 Anemia, unspecified: Secondary | ICD-10-CM

## 2016-03-13 DIAGNOSIS — E538 Deficiency of other specified B group vitamins: Secondary | ICD-10-CM

## 2016-03-13 DIAGNOSIS — Z888 Allergy status to other drugs, medicaments and biological substances status: Secondary | ICD-10-CM | POA: Diagnosis not present

## 2016-03-13 DIAGNOSIS — D61810 Antineoplastic chemotherapy induced pancytopenia: Secondary | ICD-10-CM

## 2016-03-13 DIAGNOSIS — Z9889 Other specified postprocedural states: Secondary | ICD-10-CM | POA: Diagnosis not present

## 2016-03-13 DIAGNOSIS — Z5111 Encounter for antineoplastic chemotherapy: Secondary | ICD-10-CM | POA: Diagnosis not present

## 2016-03-13 DIAGNOSIS — F17210 Nicotine dependence, cigarettes, uncomplicated: Secondary | ICD-10-CM | POA: Diagnosis not present

## 2016-03-13 DIAGNOSIS — M8580 Other specified disorders of bone density and structure, unspecified site: Secondary | ICD-10-CM | POA: Diagnosis not present

## 2016-03-13 DIAGNOSIS — R5383 Other fatigue: Secondary | ICD-10-CM

## 2016-03-13 DIAGNOSIS — E785 Hyperlipidemia, unspecified: Secondary | ICD-10-CM | POA: Diagnosis not present

## 2016-03-13 DIAGNOSIS — E611 Iron deficiency: Secondary | ICD-10-CM

## 2016-03-13 DIAGNOSIS — C3490 Malignant neoplasm of unspecified part of unspecified bronchus or lung: Secondary | ICD-10-CM | POA: Diagnosis not present

## 2016-03-13 LAB — CBC WITH DIFFERENTIAL/PLATELET
BASOS PCT: 0 %
Basophils Absolute: 0 10*3/uL (ref 0.0–0.1)
EOS ABS: 0 10*3/uL (ref 0.0–0.7)
EOS PCT: 0 %
HCT: 26.6 % — ABNORMAL LOW (ref 36.0–46.0)
HEMOGLOBIN: 8.8 g/dL — AB (ref 12.0–15.0)
Lymphocytes Relative: 8 %
Lymphs Abs: 0.8 10*3/uL (ref 0.7–4.0)
MCH: 28.7 pg (ref 26.0–34.0)
MCHC: 33.1 g/dL (ref 30.0–36.0)
MCV: 86.6 fL (ref 78.0–100.0)
Monocytes Absolute: 1 10*3/uL (ref 0.1–1.0)
Monocytes Relative: 10 %
NEUTROS PCT: 82 %
Neutro Abs: 8.1 10*3/uL — ABNORMAL HIGH (ref 1.7–7.7)
PLATELETS: 237 10*3/uL (ref 150–400)
RBC: 3.07 MIL/uL — AB (ref 3.87–5.11)
RDW: 22 % — ABNORMAL HIGH (ref 11.5–15.5)
WBC: 9.9 10*3/uL (ref 4.0–10.5)

## 2016-03-13 LAB — COMPREHENSIVE METABOLIC PANEL
ALBUMIN: 3.1 g/dL — AB (ref 3.5–5.0)
ALK PHOS: 72 U/L (ref 38–126)
ALT: 11 U/L — AB (ref 14–54)
ANION GAP: 3 — AB (ref 5–15)
AST: 19 U/L (ref 15–41)
BUN: 25 mg/dL — ABNORMAL HIGH (ref 6–20)
CHLORIDE: 108 mmol/L (ref 101–111)
CO2: 26 mmol/L (ref 22–32)
Calcium: 8.3 mg/dL — ABNORMAL LOW (ref 8.9–10.3)
Creatinine, Ser: 1.2 mg/dL — ABNORMAL HIGH (ref 0.44–1.00)
GFR calc non Af Amer: 42 mL/min — ABNORMAL LOW (ref >60)
GFR, EST AFRICAN AMERICAN: 49 mL/min — AB (ref >60)
Glucose, Bld: 84 mg/dL (ref 65–99)
POTASSIUM: 4.5 mmol/L (ref 3.5–5.1)
SODIUM: 137 mmol/L (ref 135–145)
TOTAL PROTEIN: 5.8 g/dL — AB (ref 6.5–8.1)
Total Bilirubin: 0.3 mg/dL (ref 0.3–1.2)

## 2016-03-13 LAB — MAGNESIUM: MAGNESIUM: 1.9 mg/dL (ref 1.7–2.4)

## 2016-03-13 LAB — PREPARE RBC (CROSSMATCH)

## 2016-03-13 MED ORDER — SODIUM CHLORIDE 0.9% FLUSH
10.0000 mL | INTRAVENOUS | Status: DC | PRN
  Administered 2016-03-13: 10 mL
  Filled 2016-03-13: qty 10

## 2016-03-13 MED ORDER — SODIUM CHLORIDE 0.9 % IV SOLN
Freq: Once | INTRAVENOUS | Status: AC
  Administered 2016-03-13: 13:00:00 via INTRAVENOUS
  Filled 2016-03-13: qty 5

## 2016-03-13 MED ORDER — PALONOSETRON HCL INJECTION 0.25 MG/5ML
INTRAVENOUS | Status: AC
  Filled 2016-03-13: qty 5

## 2016-03-13 MED ORDER — PALONOSETRON HCL INJECTION 0.25 MG/5ML: 0.2500 mg | Freq: Once | INTRAVENOUS | Status: DC

## 2016-03-13 MED ORDER — SODIUM CHLORIDE 0.9 % IV SOLN
90.0000 mg/m2 | Freq: Once | INTRAVENOUS | Status: AC
  Administered 2016-03-13: 140 mg via INTRAVENOUS
  Filled 2016-03-13: qty 7

## 2016-03-13 MED ORDER — SODIUM CHLORIDE 0.9 % IV SOLN
Freq: Once | INTRAVENOUS | Status: AC
  Administered 2016-03-13: 13:00:00 via INTRAVENOUS

## 2016-03-13 MED ORDER — HEPARIN SOD (PORK) LOCK FLUSH 100 UNIT/ML IV SOLN
500.0000 [IU] | Freq: Once | INTRAVENOUS | Status: AC | PRN
  Administered 2016-03-13: 500 [IU]

## 2016-03-13 MED ORDER — SODIUM CHLORIDE 0.9 % IV SOLN
64.0000 mg/m2 | Freq: Once | INTRAVENOUS | Status: AC
  Administered 2016-03-13: 100 mg via INTRAVENOUS
  Filled 2016-03-13: qty 100

## 2016-03-13 MED ORDER — HEPARIN SOD (PORK) LOCK FLUSH 100 UNIT/ML IV SOLN
INTRAVENOUS | Status: AC
  Filled 2016-03-13: qty 5

## 2016-03-13 MED ORDER — POTASSIUM CHLORIDE 2 MEQ/ML IV SOLN
Freq: Once | INTRAVENOUS | Status: AC
  Administered 2016-03-13: 11:00:00 via INTRAVENOUS
  Filled 2016-03-13: qty 10

## 2016-03-13 NOTE — Patient Instructions (Signed)
[facility] Discharge Instructions for Patients Receiving Chemotherapy   Beginning January 23rd 2017 lab work for the [facility] will be done in the  Main lab at [facility] on 1st floor. If you have a lab appointment with the [place] [facility] please come in thru the  Main Entrance and check in at the main information desk   Today you received the following chemotherapy agents Cisplatin and Etoposide. If you develop nausea and vomiting, or diarrhea that is not controlled by your medication, call the clinic.  The clinic phone number is [phone]. Office hours are Monday-Friday 8:30am-5:00pm.  BELOW ARE SYMPTOMS THAT SHOULD BE REPORTED IMMEDIATELY:  *FEVER GREATER THAN 101.0 F  *CHILLS WITH OR WITHOUT FEVER  NAUSEA AND VOMITING THAT IS NOT CONTROLLED WITH YOUR NAUSEA MEDICATION  *UNUSUAL SHORTNESS OF BREATH  *UNUSUAL BRUISING OR BLEEDING  TENDERNESS IN MOUTH AND THROAT WITH OR WITHOUT PRESENCE OF ULCERS  *URINARY PROBLEMS  *BOWEL PROBLEMS  UNUSUAL RASH Items with * indicate a potential emergency and should be followed up as soon as possible. If you have an emergency after office hours please contact your primary care physician or go to the nearest emergency department.  Please call the clinic during office hours if you have any questions or concerns.   You may also contact the Patient Navigator at [phone] should you have any questions or need assistance in obtaining follow up care.      Resources For Cancer Patients and their Caregivers ? American Cancer Society: Can assist with transportation, wigs, general needs, runs Look Good Feel Better.        336-658-0354 ? Cancer Care: Provides financial assistance, online support groups, medication/co-pay assistance.  1-800-813-HOPE 203-324-3951) ? [place] Assists [place] cancer patients and their families through emotional , educational and financial support.   707-084-4463 ? [place] DSS Where to apply for food stamps, Medicaid and utility assistance. 6576121087 ? RCATS: Transportation to medical appointments. 239-751-2452 ? Social Security Administration: May apply for disability if have a Stage IV cancer. [phone] ? [employer], Disability and Transit Services: Assists with nutrition, care and transit needs. [phone]

## 2016-03-13 NOTE — Progress Notes (Signed)
Tolerated chemo well. Ambulatory on discharge home with daughter.

## 2016-03-13 NOTE — Progress Notes (Signed)
[facility] NOTE  Patient Care Team: Chevis Pretty, FNP as PCP - General (Nurse Practitioner)  CHIEF COMPLAINTS/PURPOSE OF CONSULTATION:  Limited stage small cell lung cancer    Small cell carcinoma of right lung ([place])   11/28/2015 Imaging    Large anterior mediastinal mass in continuity with a R hilar mass, narrowing of SVC, abnormal densities in RUL, 9 mm spiculated nodule, nonspec. hypodensity in liver      12/05/2015 PET scan    Large hypermetabolic paratracheal mass c/w SCLC, perihilar nodular densities in RML, mild metabolic activity RUL nodule, no distant metastatic disease      12/11/2015 Procedure    Video bronch with biopsies and brushings, endobronchial ultrasound with mediastinal LN aspiration. Dr. Roxan Hockey      12/11/2015 Pathology Results    Trachea biopsy negative, FNA RUL malignant cells c/w SCLC      12/13/2015 -  Chemotherapy    Cisplatin/Etoposide      12/25/2015 Imaging    MRI brain- No intracranial parenchymal enhancing lesion. Tiny right frontal calvarial enhancing lesion. Small metastatic lesion not excluded although this may represent an incidentally detected benign process.      12/29/2015 - 02/13/2016 Radiation Therapy    XRT      02/14/2016 Treatment Plan Change    Chemotherapy deferred x 1 week      02/21/2016 Treatment Plan Change    Carboplatin dose reduced by 20% and Etoposide dose reduced by 10%.        HISTORY OF PRESENTING ILLNESS:  Janet Fletcher 80 y.o. female is here for ongoing follow-up of limited stage SCLC.  Janet Fletcher is accompanied by her daughter and in treatment bed. She is here for Cycle #5 Cisplatin / Etoposide.  She recently completed radiation therapy.  She has gained weight and is feeling well. Her appetite has increased. Since the end of July. Her weight is up 10 pounds. She notes that food. Finally taste good and she has an appetite. Energy is improved. She feels capable of  taking therapy today. She has a family reunion this weekend and is looking forward to that.  She denies any headaches or blurry vision. No nausea or vomiting. No pain with swallowing. No diarrhea or constipation.   MEDICAL HISTORY:  Past Medical History:  Diagnosis Date  . Allergy   . Cancer ([place])    lung  . Hyperlipidemia   . Iron deficiency anemia 12/15/2015  . Low vitamin B12 level 12/15/2015  . Osteopenia     SURGICAL HISTORY: Past Surgical History:  Procedure Laterality Date  . CATARACT EXTRACTION, BILATERAL Bilateral   . MINOR EXCISION EAR CANAL CYST Left    30+ years ago  . SMALL INTESTINE SURGERY    . VIDEO BRONCHOSCOPY WITH ENDOBRONCHIAL ULTRASOUND N/A 12/11/2015   Procedure: VIDEO BRONCHOSCOPY WITH ENDOBRONCHIAL ULTRASOUND;  Surgeon: Melrose Nakayama, MD;  Location: [facility];  Service: Thoracic;  Laterality: N/A;    SOCIAL HISTORY: Social History   Social History  . Marital status: Married    Spouse name: N/A  . Number of children: N/A  . Years of education: N/A   Occupational History  . Not on file.   Social History Main Topics  . Smoking status: Current Every Day Smoker    Packs/day: 0.25    Years: 50.00  . Smokeless tobacco: Never Used  . Alcohol use No  . Drug use: No  . Sexual activity: Not on file  Other Topics Concern  . Not on file   Social History Narrative  . No narrative on file  Married; 16 years 5 daughters 2 sons 12 grandchildren, 8 great grandchildren Smokes now but not as much; started long time ago. 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 Cancer Mother   . Hypertension Father   Mother died at 67 years old. Father died at 58 years old. 7 sisters and 7 brothers 41 living Older sister died of leukemia.   ALLERGIES:  is allergic to acetaminophen; flagyl [metronidazole hcl];  and penicillins.  MEDICATIONS:  Current Outpatient Prescriptions  Medication Sig Dispense Refill  . CISPLATIN IV Inject into the vein. Day 1 every 21 days (to begin 12/13/15)    . ETOPOSIDE IV Inject into the vein. Days 1-3 every 21 days (to begin 12/13/15)    . granisetron (SANCUSO) 3.1 MG/24HR Apply to skin starting 24 hours before chemotherapy. Remove after 7 days. 3 patch 0  . lidocaine-prilocaine (EMLA) cream Apply a quarter size amount to port site 1 hour prior to chemo. Do not rub in. Cover with plastic wrap. 30 g 3  . magnesium chloride (SLOW-MAG) 64 MG TBEC SR tablet Take 1 tablet (64 mg total) by mouth 3 (three) times daily. 90 tablet 1  . omeprazole (PRILOSEC) 40 MG capsule Take 1 capsule (40 mg total) by mouth daily. 30 capsule 3  . ondansetron (ZOFRAN) 8 MG tablet Take 1 tablet (8 mg total) by mouth every 8 (eight) hours as needed for nausea or vomiting. 30 tablet 2  . Pegfilgrastim (NEULASTA ONPRO ) Inject into the skin. To be administered 27 hours after the completion of chemo.    . Polysacchar Iron-FA-B12 (FERREX 150 FORTE) 150-1-25 MG-MG-MCG CAPS Take 1 capsule by mouth daily. 30 capsule 5  . potassium chloride SA (K-DUR,KLOR-CON) 20 MEQ tablet Take 1 tablet (20 mEq total) by mouth 2 (two) times daily. 30 tablet 0  . prochlorperazine (COMPAZINE) 10 MG tablet Take 1 tablet (10 mg total) by mouth every 6 (six) hours as needed for nausea or vomiting. 30 tablet 2  . simvastatin (ZOCOR) 40 MG tablet TAKE 1 TABLET (40 MG TOTAL) BY MOUTH AT BEDTIME. 90 tablet 0   No current facility-administered medications for this visit.     Review of Systems  Constitutional: Negative for fever, chills and malaise/fatigue.  HENT: Negative for congestion, hearing loss, nosebleeds, sore throat and tinnitus.  Eyes: Negative.  Negative for blurred vision, double vision, pain and discharge.  Respiratory: Negative for hemoptysis, sputum production, shortness of breath and wheezing.   Cardiovascular:  Negative.  Negative for chest pain, palpitations, claudication, leg swelling and PND.  Gastrointestinal: Negative.  Negative for heartburn, nausea, vomiting, abdominal pain, diarrhea, constipation, blood in stool and melena.  Genitourinary: Negative.  Negative for dysuria, urgency, frequency and hematuria.  Musculoskeletal: Negative.  Negative for myalgias, joint pain and falls.  Skin: Negative.  Negative for itching and rash.  Neurological: Negative.  Negative for dizziness, tingling, tremors, sensory change, speech change, focal weakness, seizures, loss of consciousness, weakness and headaches.  Endo/Heme/Allergies: Negative.  Does not bruise/bleed easily.  Psychiatric/Behavioral: Negative.  Negative for depression, suicidal ideas, memory loss and substance abuse. The patient is not nervous/anxious and does not have insomnia.   All other systems reviewed and are negative. 14 point ROS was done and  is otherwise as detailed above or in HPI   PHYSICAL EXAMINATION: ECOG PERFORMANCE STATUS: 1 - Symptomatic but completely ambulatory   Vitals - 1 value per visit 07/27/2692  SYSTOLIC 854  DIASTOLIC 65  Pulse 77  Temperature 97.5  Respirations 16  Weight (lb)   Height   BMI   VISIT REPORT     Physical Exam  Constitutional: She is oriented to person, place, and time and well-developed, well-nourished, and in no distress. Appears fatigued.  HENT:  Head: Normocephalic and atraumatic.  Nose: Nose normal.  Mouth/Throat: Oropharynx is clear and moist. No oropharyngeal exudate.  Eyes: Conjunctivae and EOM are normal. Pupils are equal, round, and reactive to light. Right eye exhibits no discharge. Left eye exhibits no discharge. No scleral icterus.  Neck: Normal range of motion. Neck supple. No tracheal deviation present. No thyromegaly present.  Cardiovascular: Normal rate, regular rhythm and normal heart sounds.  Exam reveals no gallop and no friction rub.   No murmur heard. Pulmonary/Chest:  Effort normal and breath sounds normal. She has no wheezes. She has no rales.  Abdominal: Soft. Bowel sounds are normal. She exhibits no distension and no mass. There is no tenderness. There is no rebound and no guarding.  Musculoskeletal: Normal range of motion. She exhibits no edema.  Lymphadenopathy:    She has no cervical adenopathy.  Neurological: She is alert and oriented to person, place, and time. She has normal reflexes. No cranial nerve deficit. Gait normal. Coordination normal.  Skin: Skin is warm and dry. No rash noted.  Psychiatric: Mood, memory, affect and judgment normal.  Nursing note and vitals reviewed.   LABORATORY DATA:  I have reviewed the data as listed Results for SAMENTHA, PERHAM (MRN [id number]) as of 03/13/2016 13:04  Ref. Range 03/13/2016 10:15  Sodium Latest Ref Range: 135 - 145 mmol/L 137  Potassium Latest Ref Range: 3.5 - 5.1 mmol/L 4.5  Chloride Latest Ref Range: 101 - 111 mmol/L 108  CO2 Latest Ref Range: 22 - 32 mmol/L 26  BUN Latest Ref Range: 6 - 20 mg/dL 25 (H)  Creatinine Latest Ref Range: 0.44 - 1.00 mg/dL 1.20 (H)  Calcium Latest Ref Range: 8.9 - 10.3 mg/dL 8.3 (L)  EGFR (Non-African Amer.) Latest Ref Range: >60 mL/min 42 (L)  EGFR (African American) Latest Ref Range: >60 mL/min 49 (L)  Glucose Latest Ref Range: 65 - 99 mg/dL 84  Anion gap Latest Ref Range: 5 - 15  3 (L)  Magnesium Latest Ref Range: 1.7 - 2.4 mg/dL 1.9  Alkaline Phosphatase Latest Ref Range: 38 - 126 U/L 72  Albumin Latest Ref Range: 3.5 - 5.0 g/dL 3.1 (L)  AST Latest Ref Range: 15 - 41 U/L 19  ALT Latest Ref Range: 14 - 54 U/L 11 (L)  Total Protein Latest Ref Range: 6.5 - 8.1 g/dL 5.8 (L)  Total Bilirubin Latest Ref Range: 0.3 - 1.2 mg/dL 0.3  WBC Latest Ref Range: 4.0 - 10.5 K/uL 9.9  RBC Latest Ref Range: 3.87 - 5.11 MIL/uL 3.07 (L)  Hemoglobin Latest Ref Range: 12.0 - 15.0 g/dL 8.8 (L)  HCT Latest Ref Range: 36.0 - 46.0 % 26.6 (L)  MCV Latest Ref Range: 78.0 - 100.0 fL 86.6   MCH Latest Ref Range: 26.0 - 34.0 pg 28.7  MCHC Latest Ref Range: 30.0 - 36.0 g/dL 33.1  RDW Latest Ref Range: 11.5 - 15.5 % 22.0 (H)  Platelets Latest Ref Range: 150 - 400 K/uL 237  Neutrophils Latest Units: %  82  Lymphocytes Latest Units: % 8  Monocytes Relative Latest Units: % 10  Eosinophil Latest Units: % 0  Basophil Latest Units: % 0  NEUT# Latest Ref Range: 1.7 - 7.7 K/uL 8.1 (H)  Lymphocyte # Latest Ref Range: 0.7 - 4.0 K/uL 0.8  Monocyte # Latest Ref Range: 0.1 - 1.0 K/uL 1.0  Eosinophils Absolute Latest Ref Range: 0.0 - 0.7 K/uL 0.0  Basophils Absolute Latest Ref Range: 0.0 - 0.1 K/uL 0.0   RADIOGRAPHIC STUDIES: I have personally reviewed the radiological images as listed and agreed with the findings in the report. No results found.  Study Result   CLINICAL DATA:  Lung cancer with generalized weakness.  EXAM: CHEST  2 VIEW  COMPARISON:  12/11/2015  FINDINGS: The lungs are clear wiithout focal pneumonia, edema, pneumothorax or pleural effusion. Hyperexpansion is consistent with emphysema. Symmetric nodular densities projecting over each lower lung are compatible with nipple shadows. The cardiopericardial silhouette is within normal limits for size. The visualized bony structures of the thorax are intact. Telemetry leads overlie the chest.  IMPRESSION: No active cardiopulmonary disease.   Electronically Signed   By: Misty Stanley M.D.   On: 01/31/2016 12:53     PATHOLOGY    ASSESSMENT & PLAN:  Limited stage small cell lung cancer Tobacco Abuse Anemia Low B12 Iron deficiency Chemotherapy induced pancyopenia Chemotherapy induced fatigue   The patient is here for Cycle #5 Cisplatin / Etoposide. She had marked pancytopenia after cycle #3. She has been dose reduced. She has completed XRT and is doing markedly better in regards to energy and appetite. Weight is up significantly.  Advised for the patient to discontinue potassium supplements at  this time. The patient does not want pain medication at this time. She does not need any refills at this time.  Her counts will be monitored weekly moving forward. If in spite of dose reduction, she develops significant pancytopenia this will be her last cycle of chemotherapy. We discussed this in detail today.   She will return for follow up in three weeks. Labs weekly as detailed.  Orders Placed This Encounter  Procedures  . CBC with Differential    Standing Status:   Future    Standing Expiration Date:   03/13/2017  . Comprehensive metabolic panel    Standing Status:   Future    Standing Expiration Date:   03/13/2017  . Magnesium    Standing Status:   Future    Standing Expiration Date:   03/13/2017  . CBC with Differential    Standing Status:   Future    Standing Expiration Date:   03/13/2017  . Comprehensive metabolic panel    Standing Status:   Future    Standing Expiration Date:   03/13/2017  . CBC with Differential    Standing Status:   Future    Standing Expiration Date:   03/13/2017  . Comprehensive metabolic panel    Standing Status:   Future    Standing Expiration Date:   03/13/2017   All questions were answered. The patient knows to call the clinic with any problems, questions or concerns.  This document serves as a record of services personally performed by Ancil Linsey, MD. It was created on her behalf by Arlyce Harman, a trained medical scribe. The creation of this record is based on the scribe's personal observations and the provider's statements to them. This document has been checked and approved by the attending provider.  I have reviewed the above  documentation for accuracy and completeness, and I agree with the above.  This note was electronically signed.  Molli Hazard, MD  03/13/2016 8:39 AM

## 2016-03-13 NOTE — Patient Instructions (Signed)
[place] at [facility] Discharge Instructions  RECOMMENDATIONS MADE BY THE CONSULTANT AND ANY TEST RESULTS WILL BE SENT TO YOUR REFERRING PHYSICIAN.  You saw Dr. Whitney Muse today. Follow up in 3 weeks with Gershon Mussel, labs and chemotherapy. Labs on 8/30 and 9/6 also  Thank you for choosing [place] at [facility] to provide your oncology and hematology care.  To afford each patient quality time with our provider, please arrive at least 15 minutes before your scheduled appointment time.   Beginning January 23rd 2017 lab work for the [facility] will be done in the  Main lab at [facility] on 1st floor. If you have a lab appointment with the [facility] please come in thru the  Main Entrance and check in at the main information desk  You need to re-schedule your appointment should you arrive 10 or more minutes late.  We strive to give you quality time with our providers, and arriving late affects you and other patients whose appointments are after yours.  Also, if you no show three or more times for appointments you may be dismissed from the clinic at the providers discretion.     Again, thank you for choosing [facility].  Our hope is that these requests will decrease the amount of time that you wait before being seen by our physicians.       _____________________________________________________________  Should you have questions after your visit to [facility], please contact our office at [phone] between the hours of 8:30 a.m. and 4:30 p.m.  Voicemails left after 4:30 p.m. will not be returned until the following business day.  For prescription refill requests, have your pharmacy contact our office.         Resources For Cancer Patients and their Caregivers ? American Cancer Society: Can assist with transportation, wigs, general needs, runs Look Good Feel Better.        [phone] ? Cancer Care: Provides  financial assistance, online support groups, medication/co-pay assistance.  1-800-813-HOPE 914-411-0550) ? [place] Assists [place] cancer patients and their families through emotional , educational and financial support.  313-274-4041 ? [place] DSS Where to apply for food stamps, Medicaid and utility assistance. 7798799348 ? RCATS: Transportation to medical appointments. 9562027563 ? Social Security Administration: May apply for disability if have a Stage IV cancer. [phone] [phone] [phone] ? [employer], Disability and Transit Services: Assists with nutrition, care and transit needs. [place] Support Programs: '@10RELATIVEDAYS'$ @ > Cancer Support Group  2nd Tuesday of the month 1pm-2pm, Journey Room  > Creative Journey  3rd Tuesday of the month 1130am-1pm, Journey Room  > Look Good Feel Better  1st Wednesday of the month 10am-12 noon, Journey Room (Call [place] to register [phone])

## 2016-03-14 ENCOUNTER — Encounter (HOSPITAL_COMMUNITY): Payer: Self-pay | Admitting: Hematology & Oncology

## 2016-03-14 ENCOUNTER — Encounter (HOSPITAL_BASED_OUTPATIENT_CLINIC_OR_DEPARTMENT_OTHER): Payer: Commercial Managed Care - HMO

## 2016-03-14 VITALS — BP 155/66 | HR 65 | Temp 97.5°F | Resp 18 | Wt 120.0 lb

## 2016-03-14 DIAGNOSIS — Z888 Allergy status to other drugs, medicaments and biological substances status: Secondary | ICD-10-CM | POA: Diagnosis not present

## 2016-03-14 DIAGNOSIS — C3491 Malignant neoplasm of unspecified part of right bronchus or lung: Secondary | ICD-10-CM | POA: Diagnosis not present

## 2016-03-14 DIAGNOSIS — M8580 Other specified disorders of bone density and structure, unspecified site: Secondary | ICD-10-CM | POA: Diagnosis not present

## 2016-03-14 DIAGNOSIS — Z5111 Encounter for antineoplastic chemotherapy: Secondary | ICD-10-CM | POA: Diagnosis not present

## 2016-03-14 DIAGNOSIS — Z9889 Other specified postprocedural states: Secondary | ICD-10-CM | POA: Diagnosis not present

## 2016-03-14 DIAGNOSIS — F17210 Nicotine dependence, cigarettes, uncomplicated: Secondary | ICD-10-CM | POA: Diagnosis not present

## 2016-03-14 DIAGNOSIS — E785 Hyperlipidemia, unspecified: Secondary | ICD-10-CM | POA: Diagnosis not present

## 2016-03-14 DIAGNOSIS — D649 Anemia, unspecified: Secondary | ICD-10-CM | POA: Diagnosis not present

## 2016-03-14 DIAGNOSIS — C3490 Malignant neoplasm of unspecified part of unspecified bronchus or lung: Secondary | ICD-10-CM | POA: Diagnosis not present

## 2016-03-14 MED ORDER — SODIUM CHLORIDE 0.9 % IV SOLN
90.0000 mg/m2 | Freq: Once | INTRAVENOUS | Status: AC
  Administered 2016-03-14: 140 mg via INTRAVENOUS
  Filled 2016-03-14: qty 7

## 2016-03-14 MED ORDER — SODIUM CHLORIDE 0.9 % IV SOLN
Freq: Once | INTRAVENOUS | Status: AC
  Administered 2016-03-14: 10:00:00 via INTRAVENOUS

## 2016-03-14 MED ORDER — HEPARIN SOD (PORK) LOCK FLUSH 100 UNIT/ML IV SOLN
500.0000 [IU] | Freq: Once | INTRAVENOUS | Status: AC | PRN
  Administered 2016-03-14: 500 [IU]

## 2016-03-14 MED ORDER — SODIUM CHLORIDE 0.9 % IV SOLN
10.0000 mg | Freq: Once | INTRAVENOUS | Status: AC
  Administered 2016-03-14: 10 mg via INTRAVENOUS
  Filled 2016-03-14: qty 1

## 2016-03-14 MED ORDER — SODIUM CHLORIDE 0.9 % IV SOLN: 250.0000 mL | Freq: Once | INTRAVENOUS | Status: DC

## 2016-03-14 MED ORDER — ACETAMINOPHEN 325 MG PO TABS
650.0000 mg | ORAL_TABLET | Freq: Once | ORAL | Status: AC
  Administered 2016-03-14: 650 mg via ORAL

## 2016-03-14 MED ORDER — DIPHENHYDRAMINE HCL 25 MG PO CAPS
ORAL_CAPSULE | ORAL | Status: AC
  Filled 2016-03-14: qty 1

## 2016-03-14 MED ORDER — ACETAMINOPHEN 325 MG PO TABS
ORAL_TABLET | ORAL | Status: AC
  Filled 2016-03-14: qty 2

## 2016-03-14 MED ORDER — SODIUM CHLORIDE 0.9% FLUSH: 10.0000 mL | INTRAVENOUS | Status: DC | PRN

## 2016-03-14 MED ORDER — DIPHENHYDRAMINE HCL 25 MG PO CAPS
25.0000 mg | ORAL_CAPSULE | Freq: Once | ORAL | Status: AC
  Administered 2016-03-14: 25 mg via ORAL

## 2016-03-14 MED ORDER — HEPARIN SOD (PORK) LOCK FLUSH 100 UNIT/ML IV SOLN
INTRAVENOUS | Status: AC
  Filled 2016-03-14: qty 5

## 2016-03-14 MED ORDER — SODIUM CHLORIDE 0.9% FLUSH
10.0000 mL | INTRAVENOUS | Status: AC | PRN
  Administered 2016-03-14: 10 mL

## 2016-03-14 NOTE — Progress Notes (Signed)
Patient tolerated transfusion and chemotherapy infusion well.  VSS.  Patient stable and ambulatory upon discharge from the clinic.

## 2016-03-14 NOTE — Patient Instructions (Signed)
[facility] Discharge Instructions for Patients Receiving Chemotherapy   Beginning January 23rd 2017 lab work for the [facility] will be done in the  Main lab at [facility] on 1st floor. If you have a lab appointment with the [facility] please come in thru the  Main Entrance and check in at the main information desk   Today you received the following chemotherapy agent: Etoposide.   One unit of blood today also.     If you develop nausea and vomiting, or diarrhea that is not controlled by your medication, call the clinic.  The clinic phone number is [phone]. Office hours are Monday-Friday 8:30am-5:00pm.  BELOW ARE SYMPTOMS THAT SHOULD BE REPORTED IMMEDIATELY:  *FEVER GREATER THAN 101.0 F  *CHILLS WITH OR WITHOUT FEVER  NAUSEA AND VOMITING THAT IS NOT CONTROLLED WITH YOUR NAUSEA MEDICATION  *UNUSUAL SHORTNESS OF BREATH  *UNUSUAL BRUISING OR BLEEDING  TENDERNESS IN MOUTH AND THROAT WITH OR WITHOUT PRESENCE OF ULCERS  *URINARY PROBLEMS  *BOWEL PROBLEMS  UNUSUAL RASH Items with * indicate a potential emergency and should be followed up as soon as possible. If you have an emergency after office hours please contact your primary care physician or go to the nearest emergency department.  Please call the clinic during office hours if you have any questions or concerns.   You may also contact the Patient Navigator at [phone] should you have any questions or need assistance in obtaining follow up care.      Resources For Cancer Patients and their Caregivers ? American Cancer Society: Can assist with transportation, wigs, general needs, runs Look Good Feel Better.        513-187-0842 ? Cancer Care: Provides financial assistance, online support groups, medication/co-pay assistance.  1-800-813-HOPE 365-279-6359) ? [place] Assists [place] cancer patients and their families through emotional , educational and  financial support.  (857)460-8877 ? [place] DSS Where to apply for food stamps, Medicaid and utility assistance. 506-089-7873 ? RCATS: Transportation to medical appointments. 669-888-7203 ? Social Security Administration: May apply for disability if have a Stage IV cancer. 405-252-5609 [phone] ? [employer], Disability and Transit Services: Assists with nutrition, care and transit needs. [phone]

## 2016-03-15 ENCOUNTER — Encounter (HOSPITAL_BASED_OUTPATIENT_CLINIC_OR_DEPARTMENT_OTHER): Payer: Commercial Managed Care - HMO

## 2016-03-15 VITALS — BP 166/71 | HR 62 | Temp 97.9°F | Resp 16 | Wt 122.8 lb

## 2016-03-15 DIAGNOSIS — Z5111 Encounter for antineoplastic chemotherapy: Secondary | ICD-10-CM | POA: Diagnosis not present

## 2016-03-15 DIAGNOSIS — C3411 Malignant neoplasm of upper lobe, right bronchus or lung: Secondary | ICD-10-CM | POA: Diagnosis not present

## 2016-03-15 DIAGNOSIS — C3491 Malignant neoplasm of unspecified part of right bronchus or lung: Secondary | ICD-10-CM | POA: Diagnosis not present

## 2016-03-15 DIAGNOSIS — Z923 Personal history of irradiation: Secondary | ICD-10-CM | POA: Diagnosis not present

## 2016-03-15 LAB — TYPE AND SCREEN
ABO/RH(D): A POS
Antibody Screen: NEGATIVE
Unit division: 0

## 2016-03-15 MED ORDER — HEPARIN SOD (PORK) LOCK FLUSH 100 UNIT/ML IV SOLN
500.0000 [IU] | Freq: Once | INTRAVENOUS | Status: AC | PRN
Start: 2016-03-15 — End: 2016-03-15
  Administered 2016-03-15: 500 [IU]

## 2016-03-15 MED ORDER — SODIUM CHLORIDE 0.9 % IV SOLN
Freq: Once | INTRAVENOUS | Status: AC
  Administered 2016-03-15: 10:00:00 via INTRAVENOUS

## 2016-03-15 MED ORDER — DEXAMETHASONE SODIUM PHOSPHATE 100 MG/10ML IJ SOLN
10.0000 mg | Freq: Once | INTRAMUSCULAR | Status: AC
  Administered 2016-03-15: 10 mg via INTRAVENOUS
  Filled 2016-03-15: qty 1

## 2016-03-15 MED ORDER — SODIUM CHLORIDE 0.9% FLUSH
10.0000 mL | INTRAVENOUS | Status: DC | PRN
  Administered 2016-03-15: 10 mL
  Filled 2016-03-15: qty 10

## 2016-03-15 MED ORDER — HEPARIN SOD (PORK) LOCK FLUSH 100 UNIT/ML IV SOLN
INTRAVENOUS | Status: AC
  Filled 2016-03-15: qty 5

## 2016-03-15 MED ORDER — ETOPOSIDE CHEMO INJECTION 1 GM/50ML
90.0000 mg/m2 | Freq: Once | INTRAVENOUS | Status: AC
  Administered 2016-03-15: 140 mg via INTRAVENOUS
  Filled 2016-03-15: qty 7

## 2016-03-15 NOTE — Progress Notes (Signed)
Tolerated chemo well. Ambulatory on discharge home with family.

## 2016-03-15 NOTE — Patient Instructions (Signed)
[facility] Discharge Instructions for Patients Receiving Chemotherapy   Beginning January 23rd 2017 lab work for the [facility] will be done in the  Main lab at [facility] on 1st floor. If you have a lab appointment with the [facility] please come in thru the  Main Entrance and check in at the main information desk   Today you received the following chemotherapy agents Day 3 Etoposide.  To help prevent nausea and vomiting after your treatment, we encourage you to take your nausea medication as instructed.   If you develop nausea and vomiting, or diarrhea that is not controlled by your medication, call the clinic.  The clinic phone number is [phone]. Office hours are Monday-Friday 8:30am-5:00pm.  BELOW ARE SYMPTOMS THAT SHOULD BE REPORTED IMMEDIATELY:  *FEVER GREATER THAN 101.0 F  *CHILLS WITH OR WITHOUT FEVER  NAUSEA AND VOMITING THAT IS NOT CONTROLLED WITH YOUR NAUSEA MEDICATION  *UNUSUAL SHORTNESS OF BREATH  *UNUSUAL BRUISING OR BLEEDING  TENDERNESS IN MOUTH AND THROAT WITH OR WITHOUT PRESENCE OF ULCERS  *URINARY PROBLEMS  *BOWEL PROBLEMS  UNUSUAL RASH Items with * indicate a potential emergency and should be followed up as soon as possible. If you have an emergency after office hours please contact your primary care physician or go to the nearest emergency department.  Please call the clinic during office hours if you have any questions or concerns.   You may also contact the Patient Navigator at [phone] should you have any questions or need assistance in obtaining follow up care.      Resources For Cancer Patients and their Caregivers ? American Cancer Society: Can assist with transportation, wigs, general needs, runs Look Good Feel Better.        ([phone] ? Cancer Care: Provides financial assistance, online support groups, medication/co-pay assistance.  1-800-813-HOPE 5800294245) ? [place] Assists [place] cancer patients and their families through emotional , educational and financial support.  803-441-4341 ? [place] DSS Where to apply for food stamps, Medicaid and utility assistance. [phone] ? RCATS: Transportation to medical appointments. (850)081-7708 ? Social Security Administration: May apply for disability if have a Stage IV cancer. 724-572-5405 [phone] ? [employer], Disability and Transit Services: Assists with nutrition, care and transit needs. [phone]

## 2016-03-18 ENCOUNTER — Other Ambulatory Visit (HOSPITAL_COMMUNITY): Payer: Self-pay

## 2016-03-18 DIAGNOSIS — D509 Iron deficiency anemia, unspecified: Secondary | ICD-10-CM

## 2016-03-20 ENCOUNTER — Encounter (HOSPITAL_COMMUNITY): Payer: Commercial Managed Care - HMO

## 2016-03-20 DIAGNOSIS — C3491 Malignant neoplasm of unspecified part of right bronchus or lung: Secondary | ICD-10-CM

## 2016-03-20 DIAGNOSIS — Z888 Allergy status to other drugs, medicaments and biological substances status: Secondary | ICD-10-CM | POA: Diagnosis not present

## 2016-03-20 DIAGNOSIS — E785 Hyperlipidemia, unspecified: Secondary | ICD-10-CM | POA: Diagnosis not present

## 2016-03-20 DIAGNOSIS — C3490 Malignant neoplasm of unspecified part of unspecified bronchus or lung: Secondary | ICD-10-CM | POA: Diagnosis not present

## 2016-03-20 DIAGNOSIS — Z9889 Other specified postprocedural states: Secondary | ICD-10-CM | POA: Diagnosis not present

## 2016-03-20 DIAGNOSIS — F17210 Nicotine dependence, cigarettes, uncomplicated: Secondary | ICD-10-CM | POA: Diagnosis not present

## 2016-03-20 DIAGNOSIS — M8580 Other specified disorders of bone density and structure, unspecified site: Secondary | ICD-10-CM | POA: Diagnosis not present

## 2016-03-20 DIAGNOSIS — D649 Anemia, unspecified: Secondary | ICD-10-CM | POA: Diagnosis not present

## 2016-03-20 LAB — CBC WITH DIFFERENTIAL/PLATELET
BASOS ABS: 0 10*3/uL (ref 0.0–0.1)
BASOS PCT: 0 %
EOS ABS: 0 10*3/uL (ref 0.0–0.7)
Eosinophils Relative: 0 %
HCT: 31.3 % — ABNORMAL LOW (ref 36.0–46.0)
HEMOGLOBIN: 10.6 g/dL — AB (ref 12.0–15.0)
LYMPHS ABS: 0.5 10*3/uL — AB (ref 0.7–4.0)
Lymphocytes Relative: 10 %
MCH: 29.8 pg (ref 26.0–34.0)
MCHC: 33.9 g/dL (ref 30.0–36.0)
MCV: 87.9 fL (ref 78.0–100.0)
Monocytes Absolute: 0 10*3/uL — ABNORMAL LOW (ref 0.1–1.0)
Monocytes Relative: 1 %
NEUTROS PCT: 89 %
Neutro Abs: 4.2 10*3/uL (ref 1.7–7.7)
Platelets: 154 10*3/uL (ref 150–400)
RBC: 3.56 MIL/uL — AB (ref 3.87–5.11)
RDW: 20.1 % — ABNORMAL HIGH (ref 11.5–15.5)
WBC: 4.7 10*3/uL (ref 4.0–10.5)

## 2016-03-20 LAB — COMPREHENSIVE METABOLIC PANEL
ALBUMIN: 3.3 g/dL — AB (ref 3.5–5.0)
ALK PHOS: 60 U/L (ref 38–126)
ALT: 13 U/L — AB (ref 14–54)
AST: 18 U/L (ref 15–41)
Anion gap: 7 (ref 5–15)
BILIRUBIN TOTAL: 0.3 mg/dL (ref 0.3–1.2)
BUN: 33 mg/dL — ABNORMAL HIGH (ref 6–20)
CO2: 27 mmol/L (ref 22–32)
Calcium: 8.6 mg/dL — ABNORMAL LOW (ref 8.9–10.3)
Chloride: 102 mmol/L (ref 101–111)
Creatinine, Ser: 1.32 mg/dL — ABNORMAL HIGH (ref 0.44–1.00)
GFR calc non Af Amer: 38 mL/min — ABNORMAL LOW (ref >60)
GFR, EST AFRICAN AMERICAN: 44 mL/min — AB (ref >60)
GLUCOSE: 91 mg/dL (ref 65–99)
POTASSIUM: 4.6 mmol/L (ref 3.5–5.1)
SODIUM: 136 mmol/L (ref 135–145)
TOTAL PROTEIN: 5.9 g/dL — AB (ref 6.5–8.1)

## 2016-03-20 LAB — MAGNESIUM: Magnesium: 1.8 mg/dL (ref 1.7–2.4)

## 2016-03-27 ENCOUNTER — Encounter (HOSPITAL_COMMUNITY): Payer: Commercial Managed Care - HMO | Attending: Hematology & Oncology

## 2016-03-27 DIAGNOSIS — C3491 Malignant neoplasm of unspecified part of right bronchus or lung: Secondary | ICD-10-CM | POA: Insufficient documentation

## 2016-03-27 LAB — CBC WITH DIFFERENTIAL/PLATELET
BASOS ABS: 0 10*3/uL (ref 0.0–0.1)
Basophils Relative: 2 %
EOS ABS: 0.1 10*3/uL (ref 0.0–0.7)
Eosinophils Relative: 6 %
HCT: 27.8 % — ABNORMAL LOW (ref 36.0–46.0)
HEMOGLOBIN: 9.1 g/dL — AB (ref 12.0–15.0)
LYMPHS PCT: 45 %
Lymphs Abs: 0.3 10*3/uL — ABNORMAL LOW (ref 0.7–4.0)
MCH: 29.2 pg (ref 26.0–34.0)
MCHC: 32.7 g/dL (ref 30.0–36.0)
MCV: 89.1 fL (ref 78.0–100.0)
MONOS PCT: 18 %
Monocytes Absolute: 0.2 10*3/uL (ref 0.1–1.0)
NEUTROS PCT: 29 %
Neutro Abs: 0.3 10*3/uL — ABNORMAL LOW (ref 1.7–7.7)
Platelets: 37 10*3/uL — ABNORMAL LOW (ref 150–400)
RBC: 3.12 MIL/uL — AB (ref 3.87–5.11)
RDW: 19.2 % — AB (ref 11.5–15.5)
WBC: 0.9 10*3/uL — CL (ref 4.0–10.5)

## 2016-03-27 LAB — COMPREHENSIVE METABOLIC PANEL
ALT: 14 U/L (ref 14–54)
ANION GAP: 8 (ref 5–15)
AST: 22 U/L (ref 15–41)
Albumin: 3.3 g/dL — ABNORMAL LOW (ref 3.5–5.0)
Alkaline Phosphatase: 61 U/L (ref 38–126)
BILIRUBIN TOTAL: 0.5 mg/dL (ref 0.3–1.2)
BUN: 26 mg/dL — ABNORMAL HIGH (ref 6–20)
CO2: 27 mmol/L (ref 22–32)
Calcium: 8.9 mg/dL (ref 8.9–10.3)
Chloride: 104 mmol/L (ref 101–111)
Creatinine, Ser: 1.43 mg/dL — ABNORMAL HIGH (ref 0.44–1.00)
GFR calc Af Amer: 40 mL/min — ABNORMAL LOW (ref >60)
GFR, EST NON AFRICAN AMERICAN: 34 mL/min — AB (ref >60)
Glucose, Bld: 109 mg/dL — ABNORMAL HIGH (ref 65–99)
POTASSIUM: 4.7 mmol/L (ref 3.5–5.1)
Sodium: 139 mmol/L (ref 135–145)
TOTAL PROTEIN: 6.1 g/dL — AB (ref 6.5–8.1)

## 2016-03-27 NOTE — Progress Notes (Unsigned)
CRITICAL VALUE ALERT Critical value received:  WBC 0.9 Date of notification:  03/27/16 Time of notification: 9741 Critical value read back:  Yes.   Nurse who received alert:  T.Analyn Matusek,RN MD notified (1st page):  [id number]

## 2016-03-28 ENCOUNTER — Other Ambulatory Visit (HOSPITAL_COMMUNITY): Payer: Self-pay | Admitting: Oncology

## 2016-03-31 ENCOUNTER — Other Ambulatory Visit (HOSPITAL_COMMUNITY): Payer: Self-pay | Admitting: Hematology & Oncology

## 2016-03-31 DIAGNOSIS — C3491 Malignant neoplasm of unspecified part of right bronchus or lung: Secondary | ICD-10-CM

## 2016-04-03 ENCOUNTER — Inpatient Hospital Stay (HOSPITAL_COMMUNITY): Payer: Commercial Managed Care - HMO

## 2016-04-03 ENCOUNTER — Encounter (HOSPITAL_COMMUNITY): Payer: Self-pay | Admitting: Lab

## 2016-04-03 ENCOUNTER — Encounter (HOSPITAL_COMMUNITY): Payer: Self-pay | Admitting: Oncology

## 2016-04-03 ENCOUNTER — Encounter (HOSPITAL_COMMUNITY): Payer: Self-pay

## 2016-04-03 ENCOUNTER — Encounter (HOSPITAL_BASED_OUTPATIENT_CLINIC_OR_DEPARTMENT_OTHER): Payer: Commercial Managed Care - HMO

## 2016-04-03 ENCOUNTER — Encounter (HOSPITAL_BASED_OUTPATIENT_CLINIC_OR_DEPARTMENT_OTHER): Payer: Commercial Managed Care - HMO | Admitting: Oncology

## 2016-04-03 VITALS — BP 143/79 | HR 86 | Temp 98.2°F | Resp 18 | Wt 122.1 lb

## 2016-04-03 DIAGNOSIS — C3491 Malignant neoplasm of unspecified part of right bronchus or lung: Secondary | ICD-10-CM

## 2016-04-03 DIAGNOSIS — Z720 Tobacco use: Secondary | ICD-10-CM | POA: Diagnosis not present

## 2016-04-03 DIAGNOSIS — E538 Deficiency of other specified B group vitamins: Secondary | ICD-10-CM

## 2016-04-03 MED ORDER — CYANOCOBALAMIN 1000 MCG/ML IJ SOLN
1000.0000 ug | Freq: Once | INTRAMUSCULAR | Status: AC
  Administered 2016-04-03: 1000 ug via INTRAMUSCULAR

## 2016-04-03 NOTE — Assessment & Plan Note (Signed)
B12 deficiency with negative intrinsic factor and anti-parietal cell antibody testing on 12/15/2015.  B12 injection due today.  B12 monthly

## 2016-04-03 NOTE — Patient Instructions (Signed)
[facility] at [facility] Discharge Instructions  RECOMMENDATIONS MADE BY THE CONSULTANT AND ANY TEST RESULTS WILL BE SENT TO YOUR REFERRING PHYSICIAN.   You were given a B12 injection today. Return as scheduled.   Thank you for choosing [facility] at [facility] to provide your oncology and hematology care.  To afford each patient quality time with our provider, please arrive at least 15 minutes before your scheduled appointment time.   Beginning January 23rd 2017 lab work for the Cancer Center will be done in the  Main lab at [facility] on 1st floor. If you have a lab appointment with the Cancer Center please come in thru the  Main Entrance and check in at the main information desk  You need to re-schedule your appointment should you arrive 10 or more minutes late.  We strive to give you quality time with our providers, and arriving late affects you and other patients whose appointments are after yours.  Also, if you no show three or more times for appointments you may be dismissed from the clinic at the providers discretion.     Again, thank you for choosing [facility].  Our hope is that these requests will decrease the amount of time that you wait before being seen by our physicians.       _____________________________________________________________  Should you have questions after your visit to  Cancer Center, please contact our office at [phone] between the hours of 8:30 a.m. and 4:30 p.m.  Voicemails left after 4:30 p.m. will not be returned until the following business day.  For prescription refill requests, have your pharmacy contact our office.         Resources For Cancer Patients and their Caregivers ? American Cancer Society: Can assist with transportation, wigs, general needs, runs Look Good Feel Better.        1-888-227-6333 ? Cancer Care: Provides financial assistance, online support groups,  medication/co-pay assistance.  1-800-813-HOPE (4673) ? [facility] Assists [place] cancer patients and their families through emotional , educational and financial support.  336-427-4357 ? [place] DSS Where to apply for food stamps, Medicaid and utility assistance. 336-342-1394 ? RCATS: Transportation to medical appointments. 336-347-2287 ? Social Security Administration: May apply for disability if have a Stage IV cancer. 336-342-7796 1-800-772-1213 ? [place] Aging, Disability and Transit Services: Assists with nutrition, care and transit needs. 336-349-2343  Cancer Center Support Programs: @10RELATIVEDAYS@ > Cancer Support Group  2nd Tuesday of the month 1pm-2pm, Journey Room  > Creative Journey  3rd Tuesday of the month 1130am-1pm, Journey Room  > Look Good Feel Better  1st Wednesday of the month 10am-12 noon, Journey Room (Call American Cancer Society to register [phone])   

## 2016-04-03 NOTE — Progress Notes (Unsigned)
Referral to [facility].  Records faxed on 9/13

## 2016-04-03 NOTE — Progress Notes (Signed)
Janet Fletcher, [place] [address]  Small cell carcinoma of right lung [facility]) - Plan: Magnesium, MAG64 535 (64 Mg) MG TBCR, CT Abdomen Pelvis W Contrast, CT Chest W Contrast, NM Bone Scan Whole Body, CBC with Differential, Comprehensive metabolic panel  Low vitamin B12 level  CURRENT THERAPY: Cisplatin/Etoposide beginning on 12/13/2015.  Also undergoing right thoracic XRT.  INTERVAL HISTORY: Janet Fletcher 80 y.o. 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    Small cell carcinoma of right lung ([place])   11/28/2015 Imaging    Large anterior mediastinal mass in continuity with a R hilar mass, narrowing of SVC, abnormal densities in RUL, 9 mm spiculated nodule, nonspec. hypodensity in liver      12/05/2015 PET scan    Large hypermetabolic paratracheal mass c/w SCLC, perihilar nodular densities in RML, mild metabolic activity RUL nodule, no distant metastatic disease      12/11/2015 Procedure    Video bronch with biopsies and brushings, endobronchial ultrasound with mediastinal LN aspiration. Dr. Roxan Hockey      12/11/2015 Pathology Results    Trachea biopsy negative, FNA RUL malignant cells c/w SCLC      12/13/2015 - 03/15/2016 Chemotherapy    Cisplatin/Etoposide x 5 cycles.  Cycle #6 cancelled due to significant cytopenias      12/25/2015 Imaging    MRI brain- No intracranial parenchymal enhancing lesion. Tiny right frontal calvarial enhancing lesion. Small metastatic lesion not excluded although this may represent an incidentally detected benign process.      12/29/2015 - 02/13/2016 Radiation Therapy    XRT      02/14/2016 Treatment Plan Change    Chemotherapy deferred x 1 week      02/21/2016 Treatment Plan Change    Carboplatin dose reduced by 20% and Etoposide dose reduced by 10%.      She is doing very well.  She notes that her appetite is significantly improved.  Weight is stable.  She notes that  she is eating 3 meals per day.  She is enjoying the taste of foods as well.  She is doing very well and denies any complaints today.  Her daughter confirms that she is doing very well.  Review of Systems  Constitutional: Negative for chills, fever and weight loss.  HENT: Negative.   Eyes: Negative.  Negative for blurred vision and double vision.  Respiratory: Negative for cough, sputum production and shortness of breath.   Cardiovascular: Negative.  Negative for chest pain.  Gastrointestinal: Negative.  Negative for constipation, diarrhea, nausea and vomiting.  Genitourinary: Negative.   Musculoskeletal: Negative.   Skin: Negative.   Neurological: Negative.  Negative for dizziness, tingling, focal weakness, weakness and headaches.  Endo/Heme/Allergies: Negative.   Psychiatric/Behavioral: Negative.     Past Medical History:  Diagnosis Date  . Allergy   . Cancer ([place])    lung  . Hyperlipidemia   . Iron deficiency anemia 12/15/2015  . Low vitamin B12 level 12/15/2015  . Osteopenia     Past Surgical History:  Procedure Laterality Date  . CATARACT EXTRACTION, BILATERAL Bilateral   . MINOR EXCISION EAR CANAL CYST Left    30+ years ago  . SMALL INTESTINE SURGERY    . VIDEO BRONCHOSCOPY WITH ENDOBRONCHIAL ULTRASOUND N/A 12/11/2015   Procedure: VIDEO BRONCHOSCOPY WITH ENDOBRONCHIAL ULTRASOUND;  Surgeon: Melrose Nakayama, MD;  Location: [place];  Service: Thoracic;  Laterality: N/A;    Family History  Problem Relation Age of Onset  . Cancer Mother   . Hypertension Father     Social History   Social History  . Marital status: Married    Spouse name: N/A  . Number of children: N/A  . Years of education: N/A   Social History Main Topics  . Smoking status: Current Every Day Smoker    Packs/day: 0.25    Years: 50.00  . Smokeless tobacco: Never Used  . Alcohol use No  . Drug use: No  . Sexual activity: Not Asked   Other Topics Concern  . None   Social History  Narrative  . None     PHYSICAL EXAMINATION  ECOG PERFORMANCE STATUS: 1 - Symptomatic but completely ambulatory  Vitals:   04/03/16 0900  BP: (!) 143/79  Pulse: 86  Resp: 18  Temp: 98.2 F (36.8 C)    GENERAL:alert, no distress, well nourished, well developed, comfortable, cooperative, smiling and accompanied by her daughter. SKIN: skin color, texture, turgor are normal, no rashes or significant lesions HEAD: Normocephalic, No masses, lesions, tenderness or abnormalities EYES: normal, EOMI, Conjunctiva are pink and non-injected EARS: External ears normal OROPHARYNX:lips, buccal mucosa, and tongue normal and mucous membranes are moist  NECK: supple, thyroid normal size, non-tender, without nodularity, trachea midline LYMPH:  no palpable lymphadenopathy BREAST:not examined LUNGS: clear to auscultation and percussion HEART: regular rate & rhythm, no murmurs, no gallops, S1 normal and S2 normal ABDOMEN:abdomen soft, non-tender and normal bowel sounds BACK: Back symmetric, no curvature. EXTREMITIES:less then 2 second capillary refill, no joint deformities, effusion, or inflammation, no skin discoloration, no cyanosis  NEURO: alert & oriented x 3 with fluent speech, no focal motor/sensory deficits, gait normal   LABORATORY DATA: CBC    Component Value Date/Time   WBC 0.9 (LL) 03/27/2016 1037   RBC 3.12 (L) 03/27/2016 1037   HGB 9.1 (L) 03/27/2016 1037   HCT 27.8 (L) 03/27/2016 1037   PLT 37 (L) 03/27/2016 1037   MCV 89.1 03/27/2016 1037   MCV 71.9 (A) 08/20/2013 1559   MCH 29.2 03/27/2016 1037   MCHC 32.7 03/27/2016 1037   RDW 19.2 (H) 03/27/2016 1037   LYMPHSABS 0.3 (L) 03/27/2016 1037   MONOABS 0.2 03/27/2016 1037   EOSABS 0.1 03/27/2016 1037   BASOSABS 0.0 03/27/2016 1037      Chemistry      Component Value Date/Time   NA 139 03/27/2016 1037   NA 143 09/29/2014 1203   K 4.7 03/27/2016 1037   CL 104 03/27/2016 1037   CO2 27 03/27/2016 1037   BUN 26 (H)  03/27/2016 1037   BUN 12 09/29/2014 1203   CREATININE 1.43 (H) 03/27/2016 1037   CREATININE 0.76 01/06/2013 1428      Component Value Date/Time   CALCIUM 8.9 03/27/2016 1037   ALKPHOS 61 03/27/2016 1037   AST 22 03/27/2016 1037   ALT 14 03/27/2016 1037   BILITOT 0.5 03/27/2016 1037   BILITOT 0.4 09/29/2014 1203     Lab Results  Component Value Date   FERRITIN 1,028 (H) 02/05/2016    PENDING LABS:   RADIOGRAPHIC STUDIES:  No results found.   PATHOLOGY:    ASSESSMENT AND PLAN:  Small cell carcinoma of right lung (HCC) Limited stage small cell lung cancer (right) began systemic chemotherapy on 12/13/2015 with curative intent with Cisplatin/Etoposide and finished her final cycle on 03/15/2016 (cycle 6 cancelled due to significant cytopenias) in addition to thoracic XRT (12/29/2015-02/13/2016).  Oncology history is updated.  For  patients with LS-SCLC treated with contemporary chemoradiotherapy and prophylactic cranial irradiation, overall response rates of 80 to 90 percent, including 50 to 60 percent complete response rates, are typically reported. Median survival is around 17 months, and the five-year survival rate is about 20 percent.  Labs today: CBC diff, CMET, Magnesium.  I personally reviewed and went over laboratory results with the patient.  The results are noted within this dictation.    CT CAP w contrast and bone scan are ordered and will be scheduled within the next 3 weeks to evaluate response to therapy.  Labs in 3-4 weeks: CBC diff, CMET.  Discussion regarding PCI.  She is S/P brain MRI on 12/25/2015 that was negative for intracranial metastases.  Will refer to XRT for consideration of PCI.  Return in 4 weeks for follow-up and review of imaging.    Low vitamin B12 level B12 deficiency with negative intrinsic factor and anti-parietal cell antibody testing on 12/15/2015.  B12 injection due today.  B12 monthly   ORDERS PLACED FOR THIS ENCOUNTER: Orders Placed  This Encounter  Procedures  . CT Abdomen Pelvis W Contrast  . CT Chest W Contrast  . NM Bone Scan Whole Body  . Magnesium  . CBC with Differential  . Comprehensive metabolic panel    MEDICATIONS PRESCRIBED THIS ENCOUNTER: Meds ordered this encounter  Medications  . MAG64 535 (64 Mg) MG TBCR    Sig: TAKE 1 TABLET (64 MG TOTAL) BY MOUTH 3 (THREE) TIMES DAILY.    Refill:  1    THERAPY PLAN:  Continue treatment as planned with curative intent.  All questions were answered. The patient knows to call the clinic with any problems, questions or concerns. We can certainly see the patient much sooner if necessary.  Patient and plan discussed with Dr. Ancil Linsey and she is in agreement with the aforementioned.   This note is electronically signed by: Doy Mince 04/03/2016 10:07 AM

## 2016-04-03 NOTE — Assessment & Plan Note (Addendum)
Limited stage small cell lung cancer (right) began systemic chemotherapy on 12/13/2015 with curative intent with Cisplatin/Etoposide and finished her final cycle on 03/15/2016 (cycle 6 cancelled due to significant cytopenias) in addition to thoracic XRT (12/29/2015-02/13/2016).  Oncology history is updated.  For patients with LS-SCLC treated with contemporary chemoradiotherapy and prophylactic cranial irradiation, overall response rates of 80 to 90 percent, including 50 to 60 percent complete response rates, are typically reported. Median survival is around 17 months, and the five-year survival rate is about 20 percent.  Labs today: CBC diff, CMET, Magnesium.  I personally reviewed and went over laboratory results with the patient.  The results are noted within this dictation.    CT CAP w contrast and bone scan are ordered and will be scheduled within the next 3 weeks to evaluate response to therapy.  Labs in 3-4 weeks: CBC diff, CMET.  Discussion regarding PCI.  She is S/P brain MRI on 12/25/2015 that was negative for intracranial metastases.  Will refer to XRT for consideration of PCI.  Return in 4 weeks for follow-up and review of imaging.

## 2016-04-03 NOTE — Progress Notes (Signed)
Pt here today for B12 injection. Pt given injection in left deltoid . Pt tolerated well. Pt stable and discharged home ambulatory. Pt to return as scheduled.

## 2016-04-03 NOTE — Patient Instructions (Signed)
[place] at [facility] Discharge Instructions  RECOMMENDATIONS MADE BY THE CONSULTANT AND ANY TEST RESULTS WILL BE SENT TO YOUR REFERRING PHYSICIAN.  You were seen by Gershon Mussel Today. CT with contrast 3 weeks Bone Scan 3 weeks  B12 1 month Return for follow up in 1 month with Labs and port flush  Thank you for choosing [place] at [facility] to provide your oncology and hematology care.  To afford each patient quality time with our provider, please arrive at least 15 minutes before your scheduled appointment time.   Beginning January 23rd 2017 lab work for the [facility] will be done in the  Main lab at [facility] on 1st floor. If you have a lab appointment with the [place] please come in thru the  Main Entrance and check in at the main information desk  You need to re-schedule your appointment should you arrive 10 or more minutes late.  We strive to give you quality time with our providers, and arriving late affects you and other patients whose appointments are after yours.  Also, if you no show three or more times for appointments you may be dismissed from the clinic at the providers discretion.     Again, thank you for choosing [facility].  Our hope is that these requests will decrease the amount of time that you wait before being seen by our physicians.       _____________________________________________________________  Should you have questions after your visit to [facility], please contact our office at [phone] between the hours of 8:30 a.m. and 4:30 p.m.  Voicemails left after 4:30 p.m. will not be returned until the following business day.  For prescription refill requests, have your pharmacy contact our office.         Resources For Cancer Patients and their Caregivers ? American Cancer Society: Can assist with transportation, wigs, general needs, runs Look Good Feel Better.         646-712-3559 ? Cancer Care: Provides financial assistance, online support groups, medication/co-pay assistance.  1-800-813-HOPE (628)625-9471) ? [place] Assists [place] cancer patients and their families through emotional , educational and financial support.  475-185-7612 ? [place] DSS Where to apply for food stamps, Medicaid and utility assistance. 772-784-3639 ? RCATS: Transportation to medical appointments. (878) 527-3056 ? Social Security Administration: May apply for disability if have a Stage IV cancer. 952-305-1762 (832)206-2141 ? [employer], Disability and Transit Services: Assists with nutrition, care and transit needs. [place] Support Programs: '@10RELATIVEDAYS'$ @ > Cancer Support Group  2nd Tuesday of the month 1pm-2pm, Journey Room  > Creative Journey  3rd Tuesday of the month 1130am-1pm, Journey Room  > Look Good Feel Better  1st Wednesday of the month 10am-12 noon, Journey Room (Call [place] to register [phone])

## 2016-04-04 ENCOUNTER — Ambulatory Visit (HOSPITAL_COMMUNITY): Payer: Commercial Managed Care - HMO

## 2016-04-04 ENCOUNTER — Inpatient Hospital Stay (HOSPITAL_COMMUNITY): Payer: Commercial Managed Care - HMO

## 2016-04-05 ENCOUNTER — Inpatient Hospital Stay (HOSPITAL_COMMUNITY): Payer: Commercial Managed Care - HMO

## 2016-04-10 DIAGNOSIS — Z9221 Personal history of antineoplastic chemotherapy: Secondary | ICD-10-CM | POA: Diagnosis not present

## 2016-04-10 DIAGNOSIS — C3411 Malignant neoplasm of upper lobe, right bronchus or lung: Secondary | ICD-10-CM | POA: Diagnosis not present

## 2016-04-10 DIAGNOSIS — Z923 Personal history of irradiation: Secondary | ICD-10-CM | POA: Diagnosis not present

## 2016-04-11 DIAGNOSIS — C3490 Malignant neoplasm of unspecified part of unspecified bronchus or lung: Secondary | ICD-10-CM | POA: Diagnosis not present

## 2016-04-11 DIAGNOSIS — M899 Disorder of bone, unspecified: Secondary | ICD-10-CM | POA: Diagnosis not present

## 2016-04-17 DIAGNOSIS — Z298 Encounter for other specified prophylactic measures: Secondary | ICD-10-CM | POA: Diagnosis not present

## 2016-04-17 DIAGNOSIS — Z9221 Personal history of antineoplastic chemotherapy: Secondary | ICD-10-CM | POA: Diagnosis not present

## 2016-04-17 DIAGNOSIS — C7931 Secondary malignant neoplasm of brain: Secondary | ICD-10-CM | POA: Diagnosis not present

## 2016-04-17 DIAGNOSIS — C3411 Malignant neoplasm of upper lobe, right bronchus or lung: Secondary | ICD-10-CM | POA: Diagnosis not present

## 2016-04-17 DIAGNOSIS — Z923 Personal history of irradiation: Secondary | ICD-10-CM | POA: Diagnosis not present

## 2016-04-18 DIAGNOSIS — Z923 Personal history of irradiation: Secondary | ICD-10-CM | POA: Diagnosis not present

## 2016-04-18 DIAGNOSIS — Z298 Encounter for other specified prophylactic measures: Secondary | ICD-10-CM | POA: Diagnosis not present

## 2016-04-18 DIAGNOSIS — Z9221 Personal history of antineoplastic chemotherapy: Secondary | ICD-10-CM | POA: Diagnosis not present

## 2016-04-18 DIAGNOSIS — C7931 Secondary malignant neoplasm of brain: Secondary | ICD-10-CM | POA: Diagnosis not present

## 2016-04-18 DIAGNOSIS — C3411 Malignant neoplasm of upper lobe, right bronchus or lung: Secondary | ICD-10-CM | POA: Diagnosis not present

## 2016-04-23 ENCOUNTER — Other Ambulatory Visit (HOSPITAL_COMMUNITY): Payer: Self-pay | Admitting: Oncology

## 2016-04-24 ENCOUNTER — Ambulatory Visit (HOSPITAL_COMMUNITY)
Admission: RE | Admit: 2016-04-24 | Discharge: 2016-04-24 | Disposition: A | Discharge status: Home / Self Care | Payer: Commercial Managed Care - HMO | Source: Ambulatory Visit | Attending: Oncology | Admitting: Oncology

## 2016-04-24 ENCOUNTER — Encounter (HOSPITAL_COMMUNITY)
Admission: RE | Admit: 2016-04-24 | Discharge: 2016-04-24 | Disposition: A | Discharge status: Home / Self Care | Payer: Commercial Managed Care - HMO | Source: Ambulatory Visit | Attending: Oncology | Admitting: Oncology

## 2016-04-24 ENCOUNTER — Other Ambulatory Visit (HOSPITAL_COMMUNITY): Payer: Self-pay | Admitting: Oncology

## 2016-04-24 ENCOUNTER — Telehealth (HOSPITAL_COMMUNITY): Payer: Self-pay | Admitting: *Deleted

## 2016-04-24 ENCOUNTER — Encounter (HOSPITAL_COMMUNITY): Payer: Self-pay

## 2016-04-24 DIAGNOSIS — R911 Solitary pulmonary nodule: Secondary | ICD-10-CM | POA: Insufficient documentation

## 2016-04-24 DIAGNOSIS — R590 Localized enlarged lymph nodes: Secondary | ICD-10-CM | POA: Diagnosis not present

## 2016-04-24 DIAGNOSIS — C3491 Malignant neoplasm of unspecified part of right bronchus or lung: Secondary | ICD-10-CM

## 2016-04-24 DIAGNOSIS — C3492 Malignant neoplasm of unspecified part of left bronchus or lung: Secondary | ICD-10-CM | POA: Diagnosis not present

## 2016-04-24 DIAGNOSIS — I700 Atherosclerosis of aorta: Secondary | ICD-10-CM | POA: Insufficient documentation

## 2016-04-24 DIAGNOSIS — Z9221 Personal history of antineoplastic chemotherapy: Secondary | ICD-10-CM | POA: Insufficient documentation

## 2016-04-24 DIAGNOSIS — N141 Nephropathy induced by other drugs, medicaments and biological substances: Secondary | ICD-10-CM

## 2016-04-24 DIAGNOSIS — T508X5A Adverse effect of diagnostic agents, initial encounter: Principal | ICD-10-CM

## 2016-04-24 LAB — POCT I-STAT CREATININE: CREATININE: 1.6 mg/dL — AB (ref 0.44–1.00)

## 2016-04-24 MED ORDER — TECHNETIUM TC 99M MEDRONATE IV KIT
20.0000 | PACK | Freq: Once | INTRAVENOUS | Status: AC | PRN
  Administered 2016-04-24: 21 via INTRAVENOUS

## 2016-04-24 MED ORDER — N-ACETYL-L-CYSTEINE 600 MG PO CAPS: 1200.0000 mg | ORAL_CAPSULE | Freq: Two times a day (BID) | ORAL | 0 refills | Status: DC

## 2016-04-24 MED ORDER — IOPAMIDOL (ISOVUE-300) INJECTION 61%
100.0000 mL | Freq: Once | INTRAVENOUS | Status: AC | PRN
  Administered 2016-04-24: 75 mL via INTRAVENOUS

## 2016-04-24 MED ORDER — IOPAMIDOL (ISOVUE-300) INJECTION 61%
INTRAVENOUS | Status: AC
  Filled 2016-04-24: qty 30

## 2016-04-24 NOTE — Telephone Encounter (Signed)
-----   Message from [facility], PA-C sent at 04/24/2016  3:58 PM EDT ----- She is having scans done today.  Please let her know that I have called in N-acetyl cysteine for 4 doses to protect her kidneys after IV CT contrast dye.  She can start this tonight after the scan.  She take 1200 mg PO twice daily for 4 treatments.  TK

## 2016-04-24 NOTE — Telephone Encounter (Signed)
Spoke with the pt's husband, kind of confusing conversation, but he finally said that the pharmacy was going to order the medication and she would have it tomorrow. I spoke with Gershon Mussel about this and he advised that the pt would need to take the medication and have labs drawn Friday and also have IV fluids as well.  Message sent to Amy for scheduling.

## 2016-04-25 ENCOUNTER — Other Ambulatory Visit (HOSPITAL_COMMUNITY): Payer: Self-pay | Admitting: *Deleted

## 2016-04-25 DIAGNOSIS — C3491 Malignant neoplasm of unspecified part of right bronchus or lung: Secondary | ICD-10-CM

## 2016-04-26 ENCOUNTER — Encounter (HOSPITAL_COMMUNITY): Payer: Self-pay

## 2016-04-26 ENCOUNTER — Encounter (HOSPITAL_COMMUNITY): Payer: Commercial Managed Care - HMO

## 2016-04-26 ENCOUNTER — Encounter (HOSPITAL_COMMUNITY): Payer: Commercial Managed Care - HMO | Attending: Hematology & Oncology

## 2016-04-26 DIAGNOSIS — M8580 Other specified disorders of bone density and structure, unspecified site: Secondary | ICD-10-CM | POA: Diagnosis not present

## 2016-04-26 DIAGNOSIS — Z809 Family history of malignant neoplasm, unspecified: Secondary | ICD-10-CM | POA: Insufficient documentation

## 2016-04-26 DIAGNOSIS — Z9221 Personal history of antineoplastic chemotherapy: Secondary | ICD-10-CM | POA: Insufficient documentation

## 2016-04-26 DIAGNOSIS — F17210 Nicotine dependence, cigarettes, uncomplicated: Secondary | ICD-10-CM | POA: Diagnosis not present

## 2016-04-26 DIAGNOSIS — Z923 Personal history of irradiation: Secondary | ICD-10-CM | POA: Insufficient documentation

## 2016-04-26 DIAGNOSIS — C3491 Malignant neoplasm of unspecified part of right bronchus or lung: Secondary | ICD-10-CM | POA: Insufficient documentation

## 2016-04-26 DIAGNOSIS — D509 Iron deficiency anemia, unspecified: Secondary | ICD-10-CM | POA: Insufficient documentation

## 2016-04-26 DIAGNOSIS — Z8249 Family history of ischemic heart disease and other diseases of the circulatory system: Secondary | ICD-10-CM | POA: Insufficient documentation

## 2016-04-26 DIAGNOSIS — Z9889 Other specified postprocedural states: Secondary | ICD-10-CM | POA: Diagnosis not present

## 2016-04-26 LAB — COMPREHENSIVE METABOLIC PANEL
ALT: 10 U/L — AB (ref 14–54)
AST: 20 U/L (ref 15–41)
Albumin: 3.3 g/dL — ABNORMAL LOW (ref 3.5–5.0)
Alkaline Phosphatase: 58 U/L (ref 38–126)
Anion gap: 5 (ref 5–15)
BILIRUBIN TOTAL: 0.2 mg/dL — AB (ref 0.3–1.2)
BUN: 23 mg/dL — AB (ref 6–20)
CO2: 27 mmol/L (ref 22–32)
CREATININE: 1.36 mg/dL — AB (ref 0.44–1.00)
Calcium: 8.7 mg/dL — ABNORMAL LOW (ref 8.9–10.3)
Chloride: 108 mmol/L (ref 101–111)
GFR calc Af Amer: 42 mL/min — ABNORMAL LOW (ref >60)
GFR, EST NON AFRICAN AMERICAN: 36 mL/min — AB (ref >60)
GLUCOSE: 130 mg/dL — AB (ref 65–99)
Potassium: 4.6 mmol/L (ref 3.5–5.1)
Sodium: 140 mmol/L (ref 135–145)
TOTAL PROTEIN: 6.3 g/dL — AB (ref 6.5–8.1)

## 2016-04-26 LAB — CBC WITH DIFFERENTIAL/PLATELET
BASOS ABS: 0 10*3/uL (ref 0.0–0.1)
Basophils Relative: 0 %
Eosinophils Absolute: 0.2 10*3/uL (ref 0.0–0.7)
Eosinophils Relative: 5 %
HEMATOCRIT: 27.3 % — AB (ref 36.0–46.0)
Hemoglobin: 8.7 g/dL — ABNORMAL LOW (ref 12.0–15.0)
LYMPHS PCT: 14 %
Lymphs Abs: 0.6 10*3/uL — ABNORMAL LOW (ref 0.7–4.0)
MCH: 30.4 pg (ref 26.0–34.0)
MCHC: 31.9 g/dL (ref 30.0–36.0)
MCV: 95.5 fL (ref 78.0–100.0)
Monocytes Absolute: 0.5 10*3/uL (ref 0.1–1.0)
Monocytes Relative: 11 %
NEUTROS ABS: 2.9 10*3/uL (ref 1.7–7.7)
NEUTROS PCT: 70 %
Platelets: 123 10*3/uL — ABNORMAL LOW (ref 150–400)
RBC: 2.86 MIL/uL — AB (ref 3.87–5.11)
RDW: 17.4 % — ABNORMAL HIGH (ref 11.5–15.5)
WBC: 4.2 10*3/uL (ref 4.0–10.5)

## 2016-04-26 LAB — SAMPLE TO BLOOD BANK

## 2016-04-26 MED ORDER — SODIUM CHLORIDE 0.9 % IV SOLN
INTRAVENOUS | Status: AC
  Administered 2016-04-26: 10:00:00 via INTRAVENOUS

## 2016-04-26 MED ORDER — HEPARIN SOD (PORK) LOCK FLUSH 100 UNIT/ML IV SOLN
500.0000 [IU] | Freq: Once | INTRAVENOUS | Status: AC
  Administered 2016-04-26: 500 [IU] via INTRAVENOUS
  Filled 2016-04-26: qty 5

## 2016-04-26 NOTE — Patient Instructions (Signed)
[place] at [facility] Discharge Instructions  RECOMMENDATIONS MADE BY THE CONSULTANT AND ANY TEST RESULTS WILL BE SENT TO YOUR REFERRING PHYSICIAN.  One liter of IV fluids (normal saline) today. Return as scheduled for lab work and office visit.  Call the clinic should you have any questions or concerns.   Thank you for choosing [place] at [facility] to provide your oncology and hematology care.  To afford each patient quality time with our provider, please arrive at least 15 minutes before your scheduled appointment time.   Beginning January 23rd 2017 lab work for the [facility] will be done in the  Main lab at [facility] on 1st floor. If you have a lab appointment with the [place] please come in thru the  Main Entrance and check in at the main information desk  You need to re-schedule your appointment should you arrive 10 or more minutes late.  We strive to give you quality time with our providers, and arriving late affects you and other patients whose appointments are after yours.  Also, if you no show three or more times for appointments you may be dismissed from the clinic at the providers discretion.     Again, thank you for choosing [facility].  Our hope is that these requests will decrease the amount of time that you wait before being seen by our physicians.       _____________________________________________________________  Should you have questions after your visit to [facility], please contact our office at [phone] between the hours of 8:30 a.m. and 4:30 p.m.  Voicemails left after 4:30 p.m. will not be returned until the following business day.  For prescription refill requests, have your pharmacy contact our office.         Resources For Cancer Patients and their Caregivers ? American Cancer Society: Can assist with transportation, wigs, general needs, runs Look Good Feel  Better.        [phone] ? Cancer Care: Provides financial assistance, online support groups, medication/co-pay assistance.  1-800-813-HOPE (570) 741-2740) ? [facility] Assists [place] cancer patients and their families through emotional , educational and financial support.  830 522 1749 ? [place] DSS Where to apply for food stamps, Medicaid and utility assistance. 808-419-4712 ? RCATS: Transportation to medical appointments. 520-007-2427 ? Social Security Administration: May apply for disability if have a Stage IV cancer. 843-786-7141 413-109-5273 ? [employer], Disability and Transit Services: Assists with nutrition, care and transit needs. [place] Support Programs: '@10RELATIVEDAYS'$ @ > Cancer Support Group  2nd Tuesday of the month 1pm-2pm, Journey Room  > Creative Journey  3rd Tuesday of the month 1130am-1pm, Journey Room  > Look Good Feel Better  1st Wednesday of the month 10am-12 noon, Journey Room (Call [place] to register [phone])

## 2016-04-26 NOTE — Progress Notes (Signed)
Tolerated infusion well.  Alert, in no distress.  Discharged ambulatory in c/o daughter.

## 2016-04-29 DIAGNOSIS — C7931 Secondary malignant neoplasm of brain: Secondary | ICD-10-CM | POA: Diagnosis not present

## 2016-04-29 DIAGNOSIS — C3411 Malignant neoplasm of upper lobe, right bronchus or lung: Secondary | ICD-10-CM | POA: Diagnosis not present

## 2016-04-29 DIAGNOSIS — Z298 Encounter for other specified prophylactic measures: Secondary | ICD-10-CM | POA: Diagnosis not present

## 2016-04-29 DIAGNOSIS — Z510 Encounter for antineoplastic radiation therapy: Secondary | ICD-10-CM | POA: Diagnosis not present

## 2016-04-30 DIAGNOSIS — C3411 Malignant neoplasm of upper lobe, right bronchus or lung: Secondary | ICD-10-CM | POA: Diagnosis not present

## 2016-04-30 DIAGNOSIS — Z298 Encounter for other specified prophylactic measures: Secondary | ICD-10-CM | POA: Diagnosis not present

## 2016-04-30 DIAGNOSIS — Z510 Encounter for antineoplastic radiation therapy: Secondary | ICD-10-CM | POA: Diagnosis not present

## 2016-05-01 ENCOUNTER — Encounter (HOSPITAL_BASED_OUTPATIENT_CLINIC_OR_DEPARTMENT_OTHER): Payer: Commercial Managed Care - HMO

## 2016-05-01 ENCOUNTER — Other Ambulatory Visit (HOSPITAL_COMMUNITY): Payer: Commercial Managed Care - HMO

## 2016-05-01 ENCOUNTER — Encounter (HOSPITAL_COMMUNITY): Payer: Self-pay | Admitting: Oncology

## 2016-05-01 ENCOUNTER — Encounter (HOSPITAL_BASED_OUTPATIENT_CLINIC_OR_DEPARTMENT_OTHER): Payer: Commercial Managed Care - HMO | Admitting: Oncology

## 2016-05-01 VITALS — BP 140/69 | HR 94 | Temp 97.7°F | Resp 18 | Ht 65.0 in | Wt 123.0 lb

## 2016-05-01 DIAGNOSIS — Z510 Encounter for antineoplastic radiation therapy: Secondary | ICD-10-CM | POA: Diagnosis not present

## 2016-05-01 DIAGNOSIS — E538 Deficiency of other specified B group vitamins: Secondary | ICD-10-CM | POA: Diagnosis not present

## 2016-05-01 DIAGNOSIS — C3491 Malignant neoplasm of unspecified part of right bronchus or lung: Secondary | ICD-10-CM | POA: Diagnosis not present

## 2016-05-01 DIAGNOSIS — D508 Other iron deficiency anemias: Secondary | ICD-10-CM

## 2016-05-01 DIAGNOSIS — D649 Anemia, unspecified: Secondary | ICD-10-CM

## 2016-05-01 DIAGNOSIS — C3411 Malignant neoplasm of upper lobe, right bronchus or lung: Secondary | ICD-10-CM | POA: Diagnosis not present

## 2016-05-01 DIAGNOSIS — Z298 Encounter for other specified prophylactic measures: Secondary | ICD-10-CM | POA: Diagnosis not present

## 2016-05-01 MED ORDER — CYANOCOBALAMIN 1000 MCG/ML IJ SOLN
1000.0000 ug | Freq: Once | INTRAMUSCULAR | Status: AC
  Administered 2016-05-01: 1000 ug via INTRAMUSCULAR
  Filled 2016-05-01: qty 1

## 2016-05-01 NOTE — Patient Instructions (Signed)
[place] at [facility] Discharge Instructions  RECOMMENDATIONS MADE BY THE CONSULTANT AND ANY TEST RESULTS WILL BE SENT TO YOUR REFERRING PHYSICIAN.  You were seen today by Kirby Crigler PA-C. Return for labs in 3 weeks. Return for follow up, labs and port flush in 6 weeks.   Thank you for choosing [place] at [facility] to provide your oncology and hematology care.  To afford each patient quality time with our provider, please arrive at least 15 minutes before your scheduled appointment time.   Beginning January 23rd 2017 lab work for the [facility] will be done in the  Main lab at [facility] on 1st floor. If you have a lab appointment with the [facility] please come in thru the  Main Entrance and check in at the main information desk  You need to re-schedule your appointment should you arrive 10 or more minutes late.  We strive to give you quality time with our providers, and arriving late affects you and other patients whose appointments are after yours.  Also, if you no show three or more times for appointments you may be dismissed from the clinic at the providers discretion.     Again, thank you for choosing [facility].  Our hope is that these requests will decrease the amount of time that you wait before being seen by our physicians.       _____________________________________________________________  Should you have questions after your visit to [facility], please contact our office at [phone] between the hours of 8:30 a.m. and 4:30 p.m.  Voicemails left after 4:30 p.m. will not be returned until the following business day.  For prescription refill requests, have your pharmacy contact our office.         Resources For Cancer Patients and their Caregivers ? American Cancer Society: Can assist with transportation, wigs, general needs, runs Look Good Feel Better.         ([phone] ? Cancer Care: Provides financial assistance, online support groups, medication/co-pay assistance.  1-800-813-HOPE (534) 752-3890) ? [place] Assists [place] cancer patients and their families through emotional , educational and financial support.  (787)154-3244 ? [place] DSS Where to apply for food stamps, Medicaid and utility assistance. 541-071-0755 ? RCATS: Transportation to medical appointments. 2028469057 ? Social Security Administration: May apply for disability if have a Stage IV cancer. 574-633-9717 779-017-8154 ? [employer], Disability and Transit Services: Assists with nutrition, care and transit needs. [place] Support Programs: '@10RELATIVEDAYS'$ @ > Cancer Support Group  2nd Tuesday of the month 1pm-2pm, Journey Room  > Creative Journey  3rd Tuesday of the month 1130am-1pm, Journey Room  > Look Good Feel Better  1st Wednesday of the month 10am-12 noon, Journey Room (Call [place] to register [phone])

## 2016-05-01 NOTE — Patient Instructions (Signed)
[place] at [facility] Discharge Instructions  RECOMMENDATIONS MADE BY THE CONSULTANT AND ANY TEST RESULTS WILL BE SENT TO YOUR REFERRING PHYSICIAN.  B12 injection given. Follow up as scheduled.  Thank you for choosing [place] at [facility] to provide your oncology and hematology care.  To afford each patient quality time with our provider, please arrive at least 15 minutes before your scheduled appointment time.   Beginning January 23rd 2017 lab work for the [facility] will be done in the  Main lab at [facility] on 1st floor. If you have a lab appointment with the [facility] please come in thru the  Main Entrance and check in at the main information desk  You need to re-schedule your appointment should you arrive 10 or more minutes late.  We strive to give you quality time with our providers, and arriving late affects you and other patients whose appointments are after yours.  Also, if you no show three or more times for appointments you may be dismissed from the clinic at the providers discretion.     Again, thank you for choosing [facility].  Our hope is that these requests will decrease the amount of time that you wait before being seen by our physicians.       _____________________________________________________________  Should you have questions after your visit to [facility], please contact our office at [phone] between the hours of 8:30 a.m. and 4:30 p.m.  Voicemails left after 4:30 p.m. will not be returned until the following business day.  For prescription refill requests, have your pharmacy contact our office.         Resources For Cancer Patients and their Caregivers ? American Cancer Society: Can assist with transportation, wigs, general needs, runs Look Good Feel Better.        (347)542-1095 ? Cancer Care: Provides financial assistance, online support groups, medication/co-pay  assistance.  1-800-813-HOPE 534-212-4105) ? [facility] Assists [place] cancer patients and their families through emotional , educational and financial support.  (281) 237-4475 ? [place] DSS Where to apply for food stamps, Medicaid and utility assistance. 908-041-1540 ? [facility]: Transportation to medical appointments. 289 508 3097 ? Social Security Administration: May apply for disability if have a Stage IV cancer. 670-453-9980 724 682 8519 ? [employer], Disability and Transit Services: Assists with nutrition, care and transit needs. [place] Support Programs: '@10RELATIVEDAYS'$ @ > Cancer Support Group  2nd Tuesday of the month 1pm-2pm, Journey Room  > Creative Journey  3rd Tuesday of the month 1130am-1pm, Journey Room  > Look Good Feel Better  1st Wednesday of the month 10am-12 noon, Journey Room (Call [facility] to register [phone])

## 2016-05-01 NOTE — Assessment & Plan Note (Addendum)
Limited stage small cell lung cancer (right) began systemic chemotherapy on 12/13/2015 with curative intent with Cisplatin/Etoposide and finished her final cycle on 03/15/2016 (cycle 6 cancelled due to significant cytopenias) in addition to thoracic XRT (12/29/2015-02/13/2016).  Oncology history is updated.  For patients with LS-SCLC treated with contemporary chemoradiotherapy and prophylactic cranial irradiation, overall response rates of 80 to 90 percent, including 50 to 60 percent complete response rates, are typically reported. Median survival is around 17 months, and the five-year survival rate is about 20 percent.  Labs on 04/26/2016: CBC diff, CMET.  I personally reviewed and went over laboratory results with the patient.  The results are noted within this dictation.  Renal function improvement is noted.  Persistent anemia is appreciated.  HGB is noted to be 8.7 g/dL and therefore blood transfusion is recommended.  HOWEVER, patient would like to hold-off on transfusion at this time.  She reports being asymptomatic.    I personally reviewed and went over radiographic studies with the patient.  The results are noted within this dictation.  CT CAP demonstrates improvement in disease.  Bone scan demonstrates a T11 abnormality and MRI imaging is recommended.  She started PCI last Monday.  Labs in 3 weeks: CBC diff, CMET, anemia panel.  Labs in 6 weeks: CBC diff, CMET.  Return in 6 weeks for follow-up.  At this appointment, future imaging, including MRI of T-spine, can be scheduled in addition to future surveillance imaging.

## 2016-05-01 NOTE — Progress Notes (Signed)
Janet Fletcher presents today for injection per MD orders. B12 1,030mg administered Im in left Upper Arm. Administration without incident. Patient tolerated well.

## 2016-05-01 NOTE — Progress Notes (Signed)
[place], [place] [address]  Small cell carcinoma of right lung [facility]) - Plan: CBC with Differential, Comprehensive metabolic panel, CBC with Differential, Comprehensive metabolic panel  Low vitamin B12 level - Plan: Vitamin B12  Other iron deficiency anemia - Plan: Iron and TIBC, Ferritin  Anemia, unspecified type - Plan: CBC with Differential, Vitamin B12, Folate, Iron and TIBC, Ferritin  CURRENT THERAPY: PCI beginnning on   INTERVAL HISTORY: Janet Fletcher 80 y.o. 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    Small cell carcinoma of right lung ([place])   11/28/2015 Imaging    Large anterior mediastinal mass in continuity with a R hilar mass, narrowing of SVC, abnormal densities in RUL, 9 mm spiculated nodule, nonspec. hypodensity in liver      12/05/2015 PET scan    Large hypermetabolic paratracheal mass c/w SCLC, perihilar nodular densities in RML, mild metabolic activity RUL nodule, no distant metastatic disease      12/11/2015 Procedure    Video bronch with biopsies and brushings, endobronchial ultrasound with mediastinal LN aspiration. Dr. Roxan Hockey      12/11/2015 Pathology Results    Trachea biopsy negative, FNA RUL malignant cells c/w SCLC      12/13/2015 - 03/15/2016 Chemotherapy    Cisplatin/Etoposide x 5 cycles.  Cycle #6 cancelled due to significant cytopenias      12/25/2015 Imaging    MRI brain- No intracranial parenchymal enhancing lesion. Tiny right frontal calvarial enhancing lesion. Small metastatic lesion not excluded although this may represent an incidentally detected benign process.      12/29/2015 - 02/13/2016 Radiation Therapy    XRT      02/14/2016 Treatment Plan Change    Chemotherapy deferred x 1 week      02/21/2016 Treatment Plan Change    Carboplatin dose reduced by 20% and Etoposide dose reduced by 10%.      04/24/2016 Imaging    CT CAP- Chest  Impression:  1. Marked reduction in mediastinal and RIGHT hilar lymphadenopathy with no residual pathologically enlarged nodes. 2. Resolution of perihilar nodularity in the RIGHT lung which was hypermetabolic on FDG PET. 3. Persistent nodule in the RIGHT upper lobe which is not hypermetabolic. 4. New ground-glass nodule in the RIGHT upper lobe is nonspecific and may relate to therapy.  Abdomen / Pelvis Impression:  1. No evidence of metastatic disease in the abdomen pelvis. 2. Atherosclerotic calcification of the aorta.      04/24/2016 Imaging    Bone scan- Nonspecific increased tracer localization at approximately T11 vertebral body; this is of uncertain etiology, could represent a degenerative process though metastatic disease is not excluded.      04/29/2016 -  Radiation Therapy    PCI      She is doing well.  She is recovering from systemic chemotherapy.  She notes that her appetite has improved.  Weight is up.  She is here to review her recent restaging tests.  Review of Systems  Constitutional: Negative for chills, fever and weight loss.  HENT: Negative.   Eyes: Negative.  Negative for blurred vision and double vision.  Respiratory: Negative for cough, sputum production and shortness of breath.   Cardiovascular: Negative.  Negative for chest pain.  Gastrointestinal: Negative.  Negative for constipation, diarrhea, nausea and vomiting.  Genitourinary: Negative.   Musculoskeletal: Negative.   Skin: Negative.   Neurological: Negative.  Negative for dizziness, tingling, focal weakness,  weakness and headaches.  Endo/Heme/Allergies: Negative.   Psychiatric/Behavioral: Negative.     Past Medical History:  Diagnosis Date  . Allergy   . Cancer ([place])    lung  . Hyperlipidemia   . Iron deficiency anemia 12/15/2015  . Low vitamin B12 level 12/15/2015  . Osteopenia     Past Surgical History:  Procedure Laterality Date  . CATARACT EXTRACTION, BILATERAL Bilateral   .  MINOR EXCISION EAR CANAL CYST Left    30+ years ago  . SMALL INTESTINE SURGERY    . VIDEO BRONCHOSCOPY WITH ENDOBRONCHIAL ULTRASOUND N/A 12/11/2015   Procedure: VIDEO BRONCHOSCOPY WITH ENDOBRONCHIAL ULTRASOUND;  Surgeon: Melrose Nakayama, MD;  Location: [facility] OR;  Service: Thoracic;  Laterality: N/A;    Family History  Problem Relation Age of Onset  . Cancer Mother   . Hypertension Father     Social History   Social History  . Marital status: Married    Spouse name: N/A  . Number of children: N/A  . Years of education: N/A   Social History Main Topics  . Smoking status: Current Every Day Smoker    Packs/day: 0.25    Years: 50.00  . Smokeless tobacco: Never Used  . Alcohol use No  . Drug use: No  . Sexual activity: Not Asked   Other Topics Concern  . None   Social History Narrative  . None     PHYSICAL EXAMINATION  ECOG PERFORMANCE STATUS: 1 - Symptomatic but completely ambulatory  Vitals:   05/01/16 1000  BP: 140/69  Pulse: 94  Resp: 18  Temp: 97.7 F (36.5 C)    GENERAL:alert, no distress, well nourished, well developed, comfortable, cooperative, smiling and accompanied by her daughter. SKIN: skin color, texture, turgor are normal, no rashes or significant lesions HEAD: Normocephalic, No masses, lesions, tenderness or abnormalities EYES: normal, EOMI, Conjunctiva are pink and non-injected EARS: External ears normal OROPHARYNX:lips, buccal mucosa, and tongue normal and mucous membranes are moist  NECK: supple, thyroid normal size, non-tender, without nodularity, trachea midline LYMPH:  no palpable lymphadenopathy BREAST:not examined LUNGS: clear to auscultation and percussion HEART: regular rate & rhythm, no murmurs, no gallops, S1 normal and S2 normal ABDOMEN:abdomen soft, non-tender and normal bowel sounds BACK: Back symmetric, no curvature. EXTREMITIES:less then 2 second capillary refill, no joint deformities, effusion, or inflammation, no skin  discoloration, no cyanosis  NEURO: alert & oriented x 3 with fluent speech, no focal motor/sensory deficits, gait normal   LABORATORY DATA: CBC    Component Value Date/Time   WBC 4.2 04/26/2016 0935   RBC 2.86 (L) 04/26/2016 0935   HGB 8.7 (L) 04/26/2016 0935   HCT 27.3 (L) 04/26/2016 0935   PLT 123 (L) 04/26/2016 0935   MCV 95.5 04/26/2016 0935   MCV 71.9 (A) 08/20/2013 1559   MCH 30.4 04/26/2016 0935   MCHC 31.9 04/26/2016 0935   RDW 17.4 (H) 04/26/2016 0935   LYMPHSABS 0.6 (L) 04/26/2016 0935   MONOABS 0.5 04/26/2016 0935   EOSABS 0.2 04/26/2016 0935   BASOSABS 0.0 04/26/2016 0935      Chemistry      Component Value Date/Time   NA 140 04/26/2016 0935   NA 143 09/29/2014 1203   K 4.6 04/26/2016 0935   CL 108 04/26/2016 0935   CO2 27 04/26/2016 0935   BUN 23 (H) 04/26/2016 0935   BUN 12 09/29/2014 1203   CREATININE 1.36 (H) 04/26/2016 0935   CREATININE 0.76 01/06/2013 1428      Component  Value Date/Time   CALCIUM 8.7 (L) 04/26/2016 0935   ALKPHOS 58 04/26/2016 0935   AST 20 04/26/2016 0935   ALT 10 (L) 04/26/2016 0935   BILITOT 0.2 (L) 04/26/2016 0935   BILITOT 0.4 09/29/2014 1203     Lab Results  Component Value Date   FERRITIN 1,028 (H) 02/05/2016    PENDING LABS:   RADIOGRAPHIC STUDIES:  Ct Chest W Contrast  Result Date: 04/24/2016 CLINICAL DATA:  Small cell lung cancer diagnosis March 2010. Chemotherapy and radiation therapy initiated. EXAM: CT CHEST, ABDOMEN, AND PELVIS WITH CONTRAST TECHNIQUE: Multidetector CT imaging of the chest, abdomen and pelvis was performed following the standard protocol during bolus administration of intravenous contrast. CONTRAST:  30m ISOVUE-300 IOPAMIDOL (ISOVUE-300) INJECTION 61% COMPARISON:  PET-CT scan 12/05/2015, CT scan 11/28/2015 FINDINGS: CT CHEST FINDINGS Cardiovascular: Coronary artery calcification and aortic atherosclerotic calcification. Mediastinum/Nodes: No axillary supraclavicular adenopathy. Marked  reduction in bulky mediastinal adenopathy. Minimal residual RIGHT paratracheal nodal thickening at 1 cm is decreased from 6 cm on prior. No pathologic enlarged mediastinal lymph nodes remain. There is haziness within the mediastinal fat following therapy. Likewise reduction in RIGHT perihilar adenopathy with no pathologic enlarged hilar nodes. Lungs/Pleura: The perihilar nodularity which was hypermetabolic on PET-CT scan is resolved. 12 mm RIGHT upper lobe (image 62, series 3) corresponds to 12 mm on nodule prior remeasured (image 62, series 3). Ground-glass nodule in the RIGHT upper lobe measuring 8 mm on image 65, series 3 is not seen on prior. Bilateral upper lobe centrilobular emphysema. Diaphragmatic hernia at the RIGHT lung base. Musculoskeletal: No aggressive osseous lesion. CT ABDOMEN AND PELVIS FINDINGS Hepatobiliary:  No focal hepatic lesion. Pancreas: Pancreas is normal. No ductal dilatation. No pancreatic inflammation. Spleen: Normal spleen Adrenals/urinary tract: Adrenal glands and kidneys are normal. The ureters and bladder normal. Stomach/Bowel: Stomach, small bowel, appendix, and cecum are normal. The colon and rectosigmoid colon are normal. Vascular/Lymphatic: Abdominal aorta is normal caliber with atherosclerotic calcification. There is no retroperitoneal or periportal lymphadenopathy. No pelvic lymphadenopathy. Reproductive: Normal uterus Other: No peritoneal disease Musculoskeletal: No aggressive osseous lesion. IMPRESSION: Chest Impression: 1. Marked reduction in mediastinal and RIGHT hilar lymphadenopathy with no residual pathologically enlarged nodes. 2. Resolution of perihilar nodularity in the RIGHT lung which was hypermetabolic on FDG PET. 3. Persistent nodule in the RIGHT upper lobe which is not hypermetabolic. 4. New ground-glass nodule in the RIGHT upper lobe is nonspecific and may relate to therapy. Abdomen / Pelvis Impression: 1. No evidence of metastatic disease in the abdomen pelvis.  2.  Atherosclerotic calcification of the aorta. Electronically Signed   By: SSuzy BouchardM.D.   On: 04/24/2016 20:17   Nm Bone Scan Whole Body  Result Date: 04/24/2016 CLINICAL DATA:  Small cell carcinoma RIGHT lung EXAM: NUCLEAR MEDICINE WHOLE BODY BONE SCAN TECHNIQUE: Whole body anterior and posterior images were obtained approximately 3 hours after intravenous injection of radiopharmaceutical. RADIOPHARMACEUTICALS:  21 mCi Technetium-960mDP IV COMPARISON:  None Correlation:  Chest radiographs 01/31/2016 FINDINGS: Small focus of tracer extravasation at injection site at the LEFT antecubital fossa. Whole-body images demonstrate no worrisome sites of abnormal tracer accumulation. Artifact from patient's belt buckle on anterior view. Expected urinary tract and soft tissue distribution of tracer otherwise identified. SPECT imaging of the thoracolumbar spine demonstrates mildly increased tracer localization at approximately the T11 vertebral body, nonspecific. Minimal uptake at LEFT L4-L5 and RIGHT L5-S1, likely degenerative. IMPRESSION: Nonspecific increased tracer localization at approximately T11 vertebral body; this is of uncertain etiology, could  represent a degenerative process though metastatic disease is not excluded. Further evaluation of the spine by MR imaging recommended. Electronically Signed   By: Lavonia Dana M.D.   On: 04/24/2016 13:56   Ct Abdomen Pelvis W Contrast  Result Date: 04/24/2016 CLINICAL DATA:  Small cell lung cancer diagnosis March 2010. Chemotherapy and radiation therapy initiated. EXAM: CT CHEST, ABDOMEN, AND PELVIS WITH CONTRAST TECHNIQUE: Multidetector CT imaging of the chest, abdomen and pelvis was performed following the standard protocol during bolus administration of intravenous contrast. CONTRAST:  43m ISOVUE-300 IOPAMIDOL (ISOVUE-300) INJECTION 61% COMPARISON:  PET-CT scan 12/05/2015, CT scan 11/28/2015 FINDINGS: CT CHEST FINDINGS Cardiovascular: Coronary artery  calcification and aortic atherosclerotic calcification. Mediastinum/Nodes: No axillary supraclavicular adenopathy. Marked reduction in bulky mediastinal adenopathy. Minimal residual RIGHT paratracheal nodal thickening at 1 cm is decreased from 6 cm on prior. No pathologic enlarged mediastinal lymph nodes remain. There is haziness within the mediastinal fat following therapy. Likewise reduction in RIGHT perihilar adenopathy with no pathologic enlarged hilar nodes. Lungs/Pleura: The perihilar nodularity which was hypermetabolic on PET-CT scan is resolved. 12 mm RIGHT upper lobe (image 62, series 3) corresponds to 12 mm on nodule prior remeasured (image 62, series 3). Ground-glass nodule in the RIGHT upper lobe measuring 8 mm on image 65, series 3 is not seen on prior. Bilateral upper lobe centrilobular emphysema. Diaphragmatic hernia at the RIGHT lung base. Musculoskeletal: No aggressive osseous lesion. CT ABDOMEN AND PELVIS FINDINGS Hepatobiliary:  No focal hepatic lesion. Pancreas: Pancreas is normal. No ductal dilatation. No pancreatic inflammation. Spleen: Normal spleen Adrenals/urinary tract: Adrenal glands and kidneys are normal. The ureters and bladder normal. Stomach/Bowel: Stomach, small bowel, appendix, and cecum are normal. The colon and rectosigmoid colon are normal. Vascular/Lymphatic: Abdominal aorta is normal caliber with atherosclerotic calcification. There is no retroperitoneal or periportal lymphadenopathy. No pelvic lymphadenopathy. Reproductive: Normal uterus Other: No peritoneal disease Musculoskeletal: No aggressive osseous lesion. IMPRESSION: Chest Impression: 1. Marked reduction in mediastinal and RIGHT hilar lymphadenopathy with no residual pathologically enlarged nodes. 2. Resolution of perihilar nodularity in the RIGHT lung which was hypermetabolic on FDG PET. 3. Persistent nodule in the RIGHT upper lobe which is not hypermetabolic. 4. New ground-glass nodule in the RIGHT upper lobe is  nonspecific and may relate to therapy. Abdomen / Pelvis Impression: 1. No evidence of metastatic disease in the abdomen pelvis. 2.  Atherosclerotic calcification of the aorta. Electronically Signed   By: SSuzy BouchardM.D.   On: 04/24/2016 20:17     PATHOLOGY:    ASSESSMENT AND PLAN:  Small cell carcinoma of right lung ([facility]) Limited stage small cell lung cancer (right) began systemic chemotherapy on 12/13/2015 with curative intent with Cisplatin/Etoposide and finished her final cycle on 03/15/2016 (cycle 6 cancelled due to significant cytopenias) in addition to thoracic XRT (12/29/2015-02/13/2016).  Oncology history is updated.  For patients with LS-SCLC treated with contemporary chemoradiotherapy and prophylactic cranial irradiation, overall response rates of 80 to 90 percent, including 50 to 60 percent complete response rates, are typically reported. Median survival is around 17 months, and the five-year survival rate is about 20 percent.  Labs on 04/26/2016: CBC diff, CMET.  I personally reviewed and went over laboratory results with the patient.  The results are noted within this dictation.  Renal function improvement is noted.  Persistent anemia is appreciated.  HGB is noted to be 8.7 g/dL and therefore blood transfusion is recommended.  HOWEVER, patient would like to hold-off on transfusion at this time.  She reports being  asymptomatic.    I personally reviewed and went over radiographic studies with the patient.  The results are noted within this dictation.  CT CAP demonstrates improvement in disease.  Bone scan demonstrates a T11 abnormality and MRI imaging is recommended.  She started PCI last Monday.  Labs in 3 weeks: CBC diff, CMET, anemia panel.  Labs in 6 weeks: CBC diff, CMET.  Return in 6 weeks for follow-up.  At this appointment, future imaging, including MRI of T-spine, can be scheduled in addition to future surveillance imaging.    ORDERS PLACED FOR THIS ENCOUNTER: Orders  Placed This Encounter  Procedures  . CBC with Differential  . Comprehensive metabolic panel  . Vitamin B12  . Folate  . Iron and TIBC  . Ferritin  . CBC with Differential  . Comprehensive metabolic panel    MEDICATIONS PRESCRIBED THIS ENCOUNTER: No orders of the defined types were placed in this encounter.   THERAPY PLAN:  NCCN guidelines recommends the following surveillance for small cell lung cancer (limited and extensive stage) (3.2017):  A. Oncology follow-up visits every 3-4 months during years 1-2, then every 6 months for years 3-5, and then annually.   B. At every visit: H&P, CT chest/liver/adrenal, blood work as indicated  C. New pulmonary nodule should initiate work-up for potential new primary.  D. Smoking cessation intervention  E. PET/CT is not recommended for routine follow-up.    All questions were answered. The patient knows to call the clinic with any problems, questions or concerns. We can certainly see the patient much sooner if necessary.  Patient and plan discussed with Dr. Ancil Linsey and she is in agreement with the aforementioned.   This note is electronically signed by: Doy Mince 05/01/2016 6:27 PM

## 2016-05-02 DIAGNOSIS — Z298 Encounter for other specified prophylactic measures: Secondary | ICD-10-CM | POA: Diagnosis not present

## 2016-05-02 DIAGNOSIS — Z510 Encounter for antineoplastic radiation therapy: Secondary | ICD-10-CM | POA: Diagnosis not present

## 2016-05-02 DIAGNOSIS — C3411 Malignant neoplasm of upper lobe, right bronchus or lung: Secondary | ICD-10-CM | POA: Diagnosis not present

## 2016-05-03 DIAGNOSIS — C3411 Malignant neoplasm of upper lobe, right bronchus or lung: Secondary | ICD-10-CM | POA: Diagnosis not present

## 2016-05-03 DIAGNOSIS — Z510 Encounter for antineoplastic radiation therapy: Secondary | ICD-10-CM | POA: Diagnosis not present

## 2016-05-03 DIAGNOSIS — Z298 Encounter for other specified prophylactic measures: Secondary | ICD-10-CM | POA: Diagnosis not present

## 2016-05-06 DIAGNOSIS — C3411 Malignant neoplasm of upper lobe, right bronchus or lung: Secondary | ICD-10-CM | POA: Diagnosis not present

## 2016-05-06 DIAGNOSIS — Z510 Encounter for antineoplastic radiation therapy: Secondary | ICD-10-CM | POA: Diagnosis not present

## 2016-05-06 DIAGNOSIS — C7931 Secondary malignant neoplasm of brain: Secondary | ICD-10-CM | POA: Diagnosis not present

## 2016-05-06 DIAGNOSIS — Z298 Encounter for other specified prophylactic measures: Secondary | ICD-10-CM | POA: Diagnosis not present

## 2016-05-07 DIAGNOSIS — Z510 Encounter for antineoplastic radiation therapy: Secondary | ICD-10-CM | POA: Diagnosis not present

## 2016-05-07 DIAGNOSIS — C3411 Malignant neoplasm of upper lobe, right bronchus or lung: Secondary | ICD-10-CM | POA: Diagnosis not present

## 2016-05-07 DIAGNOSIS — Z298 Encounter for other specified prophylactic measures: Secondary | ICD-10-CM | POA: Diagnosis not present

## 2016-05-08 DIAGNOSIS — Z510 Encounter for antineoplastic radiation therapy: Secondary | ICD-10-CM | POA: Diagnosis not present

## 2016-05-08 DIAGNOSIS — C3411 Malignant neoplasm of upper lobe, right bronchus or lung: Secondary | ICD-10-CM | POA: Diagnosis not present

## 2016-05-08 DIAGNOSIS — Z298 Encounter for other specified prophylactic measures: Secondary | ICD-10-CM | POA: Diagnosis not present

## 2016-05-09 DIAGNOSIS — C3411 Malignant neoplasm of upper lobe, right bronchus or lung: Secondary | ICD-10-CM | POA: Diagnosis not present

## 2016-05-09 DIAGNOSIS — Z298 Encounter for other specified prophylactic measures: Secondary | ICD-10-CM | POA: Diagnosis not present

## 2016-05-09 DIAGNOSIS — Z510 Encounter for antineoplastic radiation therapy: Secondary | ICD-10-CM | POA: Diagnosis not present

## 2016-05-10 DIAGNOSIS — Z510 Encounter for antineoplastic radiation therapy: Secondary | ICD-10-CM | POA: Diagnosis not present

## 2016-05-10 DIAGNOSIS — C3411 Malignant neoplasm of upper lobe, right bronchus or lung: Secondary | ICD-10-CM | POA: Diagnosis not present

## 2016-05-10 DIAGNOSIS — Z298 Encounter for other specified prophylactic measures: Secondary | ICD-10-CM | POA: Diagnosis not present

## 2016-05-14 ENCOUNTER — Other Ambulatory Visit (HOSPITAL_COMMUNITY): Payer: Self-pay | Admitting: Emergency Medicine

## 2016-05-14 NOTE — Progress Notes (Signed)
Called [facility] to cancel the order of acetylcysteine.

## 2016-05-21 ENCOUNTER — Other Ambulatory Visit (HOSPITAL_COMMUNITY): Payer: Self-pay | Admitting: *Deleted

## 2016-05-21 DIAGNOSIS — D508 Other iron deficiency anemias: Secondary | ICD-10-CM

## 2016-05-22 ENCOUNTER — Encounter (HOSPITAL_COMMUNITY): Payer: Commercial Managed Care - HMO | Attending: Hematology & Oncology

## 2016-05-22 DIAGNOSIS — D508 Other iron deficiency anemias: Secondary | ICD-10-CM

## 2016-05-22 DIAGNOSIS — C3491 Malignant neoplasm of unspecified part of right bronchus or lung: Secondary | ICD-10-CM | POA: Insufficient documentation

## 2016-05-22 DIAGNOSIS — D649 Anemia, unspecified: Secondary | ICD-10-CM

## 2016-05-22 DIAGNOSIS — E538 Deficiency of other specified B group vitamins: Secondary | ICD-10-CM

## 2016-05-22 LAB — CBC WITH DIFFERENTIAL/PLATELET
Basophils Absolute: 0 10*3/uL (ref 0.0–0.1)
Basophils Relative: 0 %
EOS ABS: 0.1 10*3/uL (ref 0.0–0.7)
EOS PCT: 3 %
HCT: 28 % — ABNORMAL LOW (ref 36.0–46.0)
HEMOGLOBIN: 9 g/dL — AB (ref 12.0–15.0)
LYMPHS ABS: 0.5 10*3/uL — AB (ref 0.7–4.0)
LYMPHS PCT: 15 %
MCH: 31.5 pg (ref 26.0–34.0)
MCHC: 32.1 g/dL (ref 30.0–36.0)
MCV: 97.9 fL (ref 78.0–100.0)
MONOS PCT: 10 %
Monocytes Absolute: 0.3 10*3/uL (ref 0.1–1.0)
Neutro Abs: 2.3 10*3/uL (ref 1.7–7.7)
Neutrophils Relative %: 72 %
PLATELETS: 136 10*3/uL — AB (ref 150–400)
RBC: 2.86 MIL/uL — ABNORMAL LOW (ref 3.87–5.11)
RDW: 13.5 % (ref 11.5–15.5)
WBC: 3.2 10*3/uL — ABNORMAL LOW (ref 4.0–10.5)

## 2016-05-22 LAB — COMPREHENSIVE METABOLIC PANEL
ALK PHOS: 65 U/L (ref 38–126)
ALT: 10 U/L — ABNORMAL LOW (ref 14–54)
ANION GAP: 4 — AB (ref 5–15)
AST: 20 U/L (ref 15–41)
Albumin: 3.4 g/dL — ABNORMAL LOW (ref 3.5–5.0)
BUN: 26 mg/dL — ABNORMAL HIGH (ref 6–20)
CALCIUM: 8.9 mg/dL (ref 8.9–10.3)
CO2: 28 mmol/L (ref 22–32)
Chloride: 108 mmol/L (ref 101–111)
Creatinine, Ser: 1.38 mg/dL — ABNORMAL HIGH (ref 0.44–1.00)
GFR, EST AFRICAN AMERICAN: 41 mL/min — AB (ref >60)
GFR, EST NON AFRICAN AMERICAN: 36 mL/min — AB (ref >60)
Glucose, Bld: 106 mg/dL — ABNORMAL HIGH (ref 65–99)
Potassium: 4.5 mmol/L (ref 3.5–5.1)
SODIUM: 140 mmol/L (ref 135–145)
Total Bilirubin: 0.3 mg/dL (ref 0.3–1.2)
Total Protein: 6.8 g/dL (ref 6.5–8.1)

## 2016-05-22 LAB — IRON AND TIBC
IRON: 31 ug/dL (ref 28–170)
Saturation Ratios: 12 % (ref 10.4–31.8)
TIBC: 253 ug/dL (ref 250–450)
UIBC: 222 ug/dL

## 2016-05-22 LAB — SAMPLE TO BLOOD BANK

## 2016-05-22 LAB — FERRITIN: Ferritin: 303 ng/mL (ref 11–307)

## 2016-05-22 LAB — VITAMIN B12: VITAMIN B 12: 911 pg/mL (ref 180–914)

## 2016-05-22 LAB — FOLATE: FOLATE: 19.4 ng/mL (ref >5.9)

## 2016-05-28 ENCOUNTER — Other Ambulatory Visit (HOSPITAL_COMMUNITY): Payer: Self-pay | Admitting: Hematology & Oncology

## 2016-05-28 ENCOUNTER — Other Ambulatory Visit: Payer: Self-pay | Admitting: Nurse Practitioner

## 2016-05-28 DIAGNOSIS — C3491 Malignant neoplasm of unspecified part of right bronchus or lung: Secondary | ICD-10-CM

## 2016-05-31 ENCOUNTER — Other Ambulatory Visit (HOSPITAL_COMMUNITY): Payer: Self-pay | Admitting: Emergency Medicine

## 2016-06-03 ENCOUNTER — Encounter (HOSPITAL_BASED_OUTPATIENT_CLINIC_OR_DEPARTMENT_OTHER): Payer: Commercial Managed Care - HMO

## 2016-06-03 ENCOUNTER — Telehealth (HOSPITAL_COMMUNITY): Payer: Self-pay | Admitting: Emergency Medicine

## 2016-06-03 VITALS — BP 108/58 | HR 80 | Temp 97.9°F | Resp 18

## 2016-06-03 DIAGNOSIS — E538 Deficiency of other specified B group vitamins: Secondary | ICD-10-CM | POA: Diagnosis not present

## 2016-06-03 MED ORDER — CYANOCOBALAMIN 1000 MCG/ML IJ SOLN
1000.0000 ug | Freq: Once | INTRAMUSCULAR | Status: AC
  Administered 2016-06-03: 1000 ug via INTRAMUSCULAR
  Filled 2016-06-03: qty 1

## 2016-06-03 NOTE — Patient Instructions (Signed)
[facility] at [facility] Discharge Instructions  RECOMMENDATIONS MADE BY THE CONSULTANT AND ANY TEST RESULTS WILL BE SENT TO YOUR REFERRING PHYSICIAN.  Vitamin B12 1000 mcg injection given today as ordered. Return as scheduled.  Thank you for choosing [facility] at [facility] to provide your oncology and hematology care.  To afford each patient quality time with our provider, please arrive at least 15 minutes before your scheduled appointment time.   Beginning January 23rd 2017 lab work for the Cancer Center will be done in the  Main lab at [place] on 1st floor. If you have a lab appointment with the Cancer Center please come in thru the  Main Entrance and check in at the main information desk  You need to re-schedule your appointment should you arrive 10 or more minutes late.  We strive to give you quality time with our providers, and arriving late affects you and other patients whose appointments are after yours.  Also, if you no show three or more times for appointments you may be dismissed from the clinic at the providers discretion.     Again, thank you for choosing [facility].  Our hope is that these requests will decrease the amount of time that you wait before being seen by our physicians.       _____________________________________________________________  Should you have questions after your visit to [facility], please contact our office at [phone] between the hours of 8:30 a.m. and 4:30 p.m.  Voicemails left after 4:30 p.m. will not be returned until the following business day.  For prescription refill requests, have your pharmacy contact our office.         Resources For Cancer Patients and their Caregivers ? American Cancer Society: Can assist with transportation, wigs, general needs, runs Look Good Feel Better.        1-888-227-6333 ? Cancer Care: Provides financial assistance, online  support groups, medication/co-pay assistance.  1-800-813-HOPE (4673) ? [facility] Assists [place] cancer patients and their families through emotional , educational and financial support.  336-427-4357 ? [place] DSS Where to apply for food stamps, Medicaid and utility assistance. 336-342-1394 ? RCATS: Transportation to medical appointments. 336-347-2287 ? Social Security Administration: May apply for disability if have a Stage IV cancer. 336-342-7796 1-800-772-1213 ? [place] Aging, Disability and Transit Services: Assists with nutrition, care and transit needs. 336-349-2343  Cancer Center Support Programs: @10RELATIVEDAYS@ > Cancer Support Group  2nd Tuesday of the month 1pm-2pm, Journey Room  > Creative Journey  3rd Tuesday of the month 1130am-1pm, Journey Room  > Look Good Feel Better  1st Wednesday of the month 10am-12 noon, Journey Room (Call American Cancer Society to register 1-800-395-5775)   

## 2016-06-03 NOTE — Progress Notes (Signed)
Janet Fletcher presents today for injection per MD orders. B12 1000 mcg administered IM in left Upper Arm. Administration without incident. Patient tolerated well.

## 2016-06-03 NOTE — Telephone Encounter (Signed)
pts daughter called and wanted to know if pt could stop potassium and magnesium?  I spoke with Dr Whitney Muse and she said it was ok to stop both of these.  The daughter verbalized understanding.

## 2016-06-04 ENCOUNTER — Other Ambulatory Visit (HOSPITAL_COMMUNITY): Payer: Self-pay | Admitting: Nurse Practitioner

## 2016-06-04 DIAGNOSIS — C3491 Malignant neoplasm of unspecified part of right bronchus or lung: Secondary | ICD-10-CM

## 2016-06-12 ENCOUNTER — Other Ambulatory Visit (HOSPITAL_COMMUNITY): Payer: Commercial Managed Care - HMO

## 2016-06-12 ENCOUNTER — Encounter (HOSPITAL_COMMUNITY): Payer: Commercial Managed Care - HMO | Attending: Oncology | Admitting: Oncology

## 2016-06-12 ENCOUNTER — Encounter (HOSPITAL_COMMUNITY): Payer: Self-pay | Admitting: Oncology

## 2016-06-12 ENCOUNTER — Encounter (HOSPITAL_COMMUNITY): Payer: Commercial Managed Care - HMO

## 2016-06-12 VITALS — BP 95/56 | HR 85 | Temp 97.7°F | Resp 18 | Ht 63.0 in | Wt 121.0 lb

## 2016-06-12 DIAGNOSIS — E538 Deficiency of other specified B group vitamins: Secondary | ICD-10-CM | POA: Diagnosis not present

## 2016-06-12 DIAGNOSIS — D649 Anemia, unspecified: Secondary | ICD-10-CM

## 2016-06-12 DIAGNOSIS — C3491 Malignant neoplasm of unspecified part of right bronchus or lung: Secondary | ICD-10-CM | POA: Diagnosis not present

## 2016-06-12 DIAGNOSIS — Z923 Personal history of irradiation: Secondary | ICD-10-CM | POA: Diagnosis not present

## 2016-06-12 DIAGNOSIS — N289 Disorder of kidney and ureter, unspecified: Secondary | ICD-10-CM | POA: Diagnosis not present

## 2016-06-12 DIAGNOSIS — Z720 Tobacco use: Secondary | ICD-10-CM

## 2016-06-12 DIAGNOSIS — C3411 Malignant neoplasm of upper lobe, right bronchus or lung: Secondary | ICD-10-CM | POA: Diagnosis not present

## 2016-06-12 LAB — COMPREHENSIVE METABOLIC PANEL
ALBUMIN: 3.4 g/dL — AB (ref 3.5–5.0)
ALK PHOS: 59 U/L (ref 38–126)
ALT: 11 U/L — AB (ref 14–54)
AST: 20 U/L (ref 15–41)
Anion gap: 5 (ref 5–15)
BUN: 31 mg/dL — ABNORMAL HIGH (ref 6–20)
CALCIUM: 9 mg/dL (ref 8.9–10.3)
CO2: 28 mmol/L (ref 22–32)
CREATININE: 1.24 mg/dL — AB (ref 0.44–1.00)
Chloride: 105 mmol/L (ref 101–111)
GFR calc Af Amer: 47 mL/min — ABNORMAL LOW (ref >60)
GFR calc non Af Amer: 41 mL/min — ABNORMAL LOW (ref >60)
GLUCOSE: 98 mg/dL (ref 65–99)
Potassium: 4.5 mmol/L (ref 3.5–5.1)
SODIUM: 138 mmol/L (ref 135–145)
Total Bilirubin: 0.4 mg/dL (ref 0.3–1.2)
Total Protein: 6.4 g/dL — ABNORMAL LOW (ref 6.5–8.1)

## 2016-06-12 LAB — CBC WITH DIFFERENTIAL/PLATELET
BASOS ABS: 0 10*3/uL (ref 0.0–0.1)
BASOS PCT: 0 %
EOS ABS: 0.1 10*3/uL (ref 0.0–0.7)
Eosinophils Relative: 3 %
HCT: 26.1 % — ABNORMAL LOW (ref 36.0–46.0)
HEMOGLOBIN: 8.3 g/dL — AB (ref 12.0–15.0)
Lymphocytes Relative: 18 %
Lymphs Abs: 0.5 10*3/uL — ABNORMAL LOW (ref 0.7–4.0)
MCH: 31.3 pg (ref 26.0–34.0)
MCHC: 31.8 g/dL (ref 30.0–36.0)
MCV: 98.5 fL (ref 78.0–100.0)
Monocytes Absolute: 0.3 10*3/uL (ref 0.1–1.0)
Monocytes Relative: 12 %
NEUTROS PCT: 67 %
Neutro Abs: 1.9 10*3/uL (ref 1.7–7.7)
Platelets: 162 10*3/uL (ref 150–400)
RBC: 2.65 MIL/uL — AB (ref 3.87–5.11)
RDW: 12.3 % (ref 11.5–15.5)
WBC: 2.8 10*3/uL — AB (ref 4.0–10.5)

## 2016-06-12 MED ORDER — SODIUM CHLORIDE 0.9% FLUSH
10.0000 mL | INTRAVENOUS | Status: DC | PRN
  Administered 2016-06-12: 10 mL via INTRAVENOUS
  Filled 2016-06-12: qty 10

## 2016-06-12 MED ORDER — HEPARIN SOD (PORK) LOCK FLUSH 100 UNIT/ML IV SOLN
500.0000 [IU] | Freq: Once | INTRAVENOUS | Status: AC
  Administered 2016-06-12: 500 [IU] via INTRAVENOUS

## 2016-06-12 MED ORDER — HEPARIN SOD (PORK) LOCK FLUSH 100 UNIT/ML IV SOLN
INTRAVENOUS | Status: AC
  Filled 2016-06-12: qty 5

## 2016-06-12 NOTE — Assessment & Plan Note (Addendum)
Limited stage small cell lung cancer (right) began systemic chemotherapy on 12/13/2015 with curative intent with Cisplatin/Etoposide and finished her final cycle on 03/15/2016 (cycle 6 cancelled due to significant cytopenias) in addition to thoracic XRT (12/29/2015-02/13/2016).  Oncology history is updated.  For patients with LS-SCLC treated with contemporary chemoradiotherapy and prophylactic cranial irradiation, overall response rates of 80 to 90 percent, including 50 to 60 percent complete response rates, are typically reported. Median survival is around 17 months, and the five-year survival rate is about 20 percent.  Labs today: CBC diff, CMET.  I personally reviewed and went over laboratory results with the patient.  The results are noted within this dictation. HGB is 8.3 g/dL.  Renal function is stable, but a definite element of renal disease.    I will ask nursing to recall the to start Aranesp every 2 weeks at renal dosing (40 mcg every 2 weeks).  Supportive therapy plan is built.  Her anemia is likely contributing to her fatigue and tiredness.  She is due for restaging imaging in ~ 6 weeks.  Orders placed for CT CAP w contrast.  I have also ordered a CT of T-spine to evaluate abnormal uptake in T-spine in October 2017.  Labs in 6 weeks: CBC diff, CMET.  Return in 6 weeks for follow-up and review of imaging studies.

## 2016-06-12 NOTE — Progress Notes (Signed)
[place], [place] [address]  Small cell carcinoma of right lung [facility]) - Plan: CT Abdomen W Contrast, CT Chest W Contrast, CT Thoracic Spine W Contrast, CBC with Differential, Comprehensive metabolic panel  Low vitamin B12 level  CURRENT THERAPY: Surveillance per NCCN guidelines  INTERVAL HISTORY: Janet Fletcher 79 y.o. 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    Small cell carcinoma of right lung ([place])   11/28/2015 Imaging    Large anterior mediastinal mass in continuity with a R hilar mass, narrowing of SVC, abnormal densities in RUL, 9 mm spiculated nodule, nonspec. hypodensity in liver      12/05/2015 PET scan    Large hypermetabolic paratracheal mass c/w SCLC, perihilar nodular densities in RML, mild metabolic activity RUL nodule, no distant metastatic disease      12/11/2015 Procedure    Video bronch with biopsies and brushings, endobronchial ultrasound with mediastinal LN aspiration. Dr. Roxan Hockey      12/11/2015 Pathology Results    Trachea biopsy negative, FNA RUL malignant cells c/w SCLC      12/13/2015 - 03/15/2016 Chemotherapy    Cisplatin/Etoposide x 5 cycles.  Cycle #6 cancelled due to significant cytopenias      12/25/2015 Imaging    MRI brain- No intracranial parenchymal enhancing lesion. Tiny right frontal calvarial enhancing lesion. Small metastatic lesion not excluded although this may represent an incidentally detected benign process.      12/29/2015 - 02/13/2016 Radiation Therapy    XRT      02/14/2016 Treatment Plan Change    Chemotherapy deferred x 1 week      02/21/2016 Treatment Plan Change    Carboplatin dose reduced by 20% and Etoposide dose reduced by 10%.      04/24/2016 Imaging    CT CAP- Chest Impression:  1. Marked reduction in mediastinal and RIGHT hilar lymphadenopathy with no residual pathologically enlarged nodes. 2. Resolution of perihilar nodularity  in the RIGHT lung which was hypermetabolic on FDG PET. 3. Persistent nodule in the RIGHT upper lobe which is not hypermetabolic. 4. New ground-glass nodule in the RIGHT upper lobe is nonspecific and may relate to therapy.  Abdomen / Pelvis Impression:  1. No evidence of metastatic disease in the abdomen pelvis. 2. Atherosclerotic calcification of the aorta.      04/24/2016 Imaging    Bone scan- Nonspecific increased tracer localization at approximately T11 vertebral body; this is of uncertain etiology, could represent a degenerative process though metastatic disease is not excluded.      04/29/2016 -  Radiation Therapy    PCI       She is doing well.  Her appetite is improved.  Weight is stable.  "Now I can eat 2 hotdogs" at one sitting.  She notes a right upper arm soreness with ROM of her shoulder.  She denies shoulder pain.  She denies any redness or tenderness to palpation of this tender area.  She notes that she thinks she slept on it wrong.    Review of Systems  Constitutional: Positive for malaise/fatigue (improved, but persistent). Negative for chills, fever and weight loss.  HENT: Negative.   Eyes: Negative.  Negative for double vision.  Respiratory: Positive for shortness of breath (with exertion). Negative for cough and hemoptysis.   Cardiovascular: Negative.  Negative for chest pain.  Gastrointestinal: Negative.  Negative for nausea and vomiting.  Genitourinary: Negative.   Musculoskeletal: Negative.  Negative for falls.       Right upper arm discomfort with movement  Skin: Negative.   Neurological: Negative.  Negative for dizziness, focal weakness, loss of consciousness, weakness and headaches.  Endo/Heme/Allergies: Negative.     Past Medical History:  Diagnosis Date  . Allergy   . Cancer ([place])    lung  . Hyperlipidemia   . Iron deficiency anemia 12/15/2015  . Low vitamin B12 level 12/15/2015  . Osteopenia     Past Surgical History:  Procedure  Laterality Date  . CATARACT EXTRACTION, BILATERAL Bilateral   . MINOR EXCISION EAR CANAL CYST Left    30+ years ago  . SMALL INTESTINE SURGERY    . VIDEO BRONCHOSCOPY WITH ENDOBRONCHIAL ULTRASOUND N/A 12/11/2015   Procedure: VIDEO BRONCHOSCOPY WITH ENDOBRONCHIAL ULTRASOUND;  Surgeon: Melrose Nakayama, MD;  [facility];  Service: Thoracic;  Laterality: N/A;    Family History  Problem Relation Age of Onset  . Cancer Mother   . Hypertension Father     Social History   Social History  . Marital status: Married    Spouse name: N/A  . Number of children: N/A  . Years of education: N/A   Social History Main Topics  . Smoking status: Current Every Day Smoker    Packs/day: 0.25    Years: 50.00  . Smokeless tobacco: Never Used  . Alcohol use No  . Drug use: No  . Sexual activity: Not Asked   Other Topics Concern  . None   Social History Narrative  . None     PHYSICAL EXAMINATION  ECOG PERFORMANCE STATUS: 1 - Symptomatic but completely ambulatory  Vitals:   06/12/16 1133  BP: (!) 95/56  Pulse: 85  Resp: 18  Temp: 97.7 F (36.5 C)    GENERAL:alert, no distress, well nourished, well developed, comfortable, cooperative, smiling and accompanied by her daughter and granddaughter. SKIN: skin color, texture, turgor are normal, no rashes or significant lesions HEAD: Normocephalic, No masses, lesions, tenderness or abnormalities EYES: normal, EOMI, Conjunctiva are pink and non-injected EARS: External ears normal OROPHARYNX:lips, buccal mucosa, and tongue normal and mucous membranes are moist  NECK: supple, thyroid normal size, non-tender, without nodularity, trachea midline LYMPH:  no palpable lymphadenopathy BREAST:not examined LUNGS: clear to auscultation and percussion HEART: regular rate & rhythm, no murmurs, no gallops, S1 normal and S2 normal ABDOMEN:abdomen soft, non-tender and normal bowel sounds BACK: Back symmetric, no curvature. EXTREMITIES:less then 2  second capillary refill, no joint deformities, effusion, or inflammation, no skin discoloration, no cyanosis.  No erythema, heat, or tenderness to palpation of right upper arm. NEURO: alert & oriented x 3 with fluent speech, no focal motor/sensory deficits, gait normal   LABORATORY DATA: CBC    Component Value Date/Time   WBC 2.8 (L) 06/12/2016 1108   RBC 2.65 (L) 06/12/2016 1108   HGB 8.3 (L) 06/12/2016 1108   HCT 26.1 (L) 06/12/2016 1108   PLT 162 06/12/2016 1108   MCV 98.5 06/12/2016 1108   MCV 71.9 (A) 08/20/2013 1559   MCH 31.3 06/12/2016 1108   MCHC 31.8 06/12/2016 1108   RDW 12.3 06/12/2016 1108   LYMPHSABS 0.5 (L) 06/12/2016 1108   MONOABS 0.3 06/12/2016 1108   EOSABS 0.1 06/12/2016 1108   BASOSABS 0.0 06/12/2016 1108      Chemistry      Component Value Date/Time   NA 138 06/12/2016 1108   NA 143 09/29/2014 1203   K 4.5 06/12/2016 1108   CL 105  06/12/2016 1108   CO2 28 06/12/2016 1108   BUN 31 (H) 06/12/2016 1108   BUN 12 09/29/2014 1203   CREATININE 1.24 (H) 06/12/2016 1108   CREATININE 0.76 01/06/2013 1428      Component Value Date/Time   CALCIUM 9.0 06/12/2016 1108   ALKPHOS 59 06/12/2016 1108   AST 20 06/12/2016 1108   ALT 11 (L) 06/12/2016 1108   BILITOT 0.4 06/12/2016 1108   BILITOT 0.4 09/29/2014 1203     Lab Results  Component Value Date   IRON 31 05/22/2016   TIBC 253 05/22/2016   FERRITIN 303 05/22/2016    PENDING LABS:   RADIOGRAPHIC STUDIES:  No results found.   PATHOLOGY:    ASSESSMENT AND PLAN:  Small cell carcinoma of right lung (HCC) Limited stage small cell lung cancer (right) began systemic chemotherapy on 12/13/2015 with curative intent with Cisplatin/Etoposide and finished her final cycle on 03/15/2016 (cycle 6 cancelled due to significant cytopenias) in addition to thoracic XRT (12/29/2015-02/13/2016).  Oncology history is updated.  For patients with LS-SCLC treated with contemporary chemoradiotherapy and prophylactic  cranial irradiation, overall response rates of 80 to 90 percent, including 50 to 60 percent complete response rates, are typically reported. Median survival is around 17 months, and the five-year survival rate is about 20 percent.  Labs today: CBC diff, CMET.  I personally reviewed and went over laboratory results with the patient.  The results are noted within this dictation. HGB is 8.3 g/dL.  Renal function is stable, but a definite element of renal disease.    I will ask nursing to recall the to start Aranesp every 2 weeks at renal dosing (40 mcg every 2 weeks).  Supportive therapy plan is built.  Her anemia is likely contributing to her fatigue and tiredness.  She is due for restaging imaging in ~ 6 weeks.  Orders placed for CT CAP w contrast.  I have also ordered a CT of T-spine to evaluate abnormal uptake in T-spine in October 2017.  Labs in 6 weeks: CBC diff, CMET.  Return in 6 weeks for follow-up and review of imaging studies.  Low vitamin B12 level B12 deficiency with negative intrinsic factor and anti-parietal cell antibody testing on 12/15/2015.  B12 monthly   ORDERS PLACED FOR THIS ENCOUNTER: Orders Placed This Encounter  Procedures  . CT Abdomen W Contrast  . CT Chest W Contrast  . CT Thoracic Spine W Contrast  . CBC with Differential  . Comprehensive metabolic panel    MEDICATIONS PRESCRIBED THIS ENCOUNTER: No orders of the defined types were placed in this encounter.   THERAPY PLAN:  NCCN guidelines recommends the following surveillance for small cell lung cancer (limited and extensive stage) (3.2017):  A. Oncology follow-up visits every 3-4 months during years 1-2, then every 6 months for years 3-5, and then annually.   B. At every visit: H&P, CT chest/liver/adrenal, blood work as indicated  C. New pulmonary nodule should initiate work-up for potential new primary.  D. Smoking cessation intervention  E. PET/CT is not recommended for routine follow-up.    All  questions were answered. The patient knows to call the clinic with any problems, questions or concerns. We can certainly see the patient much sooner if necessary.  Patient and plan discussed with Dr. Ancil Linsey and she is in agreement with the aforementioned.   This note is electronically signed by: Doy Mince 06/12/2016 3:43 PM

## 2016-06-12 NOTE — Patient Instructions (Signed)
[place] at [facility] Discharge Instructions  RECOMMENDATIONS MADE BY THE CONSULTANT AND ANY TEST RESULTS WILL BE SENT TO YOUR REFERRING PHYSICIAN.  Port flush with labs done. Follow up as scheduled.  Thank you for choosing [place] at [facility] to provide your oncology and hematology care.  To afford each patient quality time with our provider, please arrive at least 15 minutes before your scheduled appointment time.   Beginning January 23rd 2017 lab work for the [facility] will be done in the  Main lab at [facility] on 1st floor. If you have a lab appointment with the [facility] [facility] please come in thru the  Main Entrance and check in at the main information desk  You need to re-schedule your appointment should you arrive 10 or more minutes late.  We strive to give you quality time with our providers, and arriving late affects you and other patients whose appointments are after yours.  Also, if you no show three or more times for appointments you may be dismissed from the clinic at the providers discretion.     Again, thank you for choosing [facility].  Our hope is that these requests will decrease the amount of time that you wait before being seen by our physicians.       _____________________________________________________________  Should you have questions after your visit to [facility], please contact our office at [phone] between the hours of 8:30 a.m. and 4:30 p.m.  Voicemails left after 4:30 p.m. will not be returned until the following business day.  For prescription refill requests, have your pharmacy contact our office.         Resources For Cancer Patients and their Caregivers ? American Cancer Society: Can assist with transportation, wigs, general needs, runs Look Good Feel Better.        407-691-9656 ? Cancer Care: Provides financial assistance, online support groups,  medication/co-pay assistance.  1-800-813-HOPE (706) 388-9461) ? [facility] Assists [place] cancer patients and their families through emotional , educational and financial support.  571-105-7505 ? [place] DSS Where to apply for food stamps, Medicaid and utility assistance. 845 085 5592 ? RCATS: Transportation to medical appointments. (954)156-9216 ? Social Security Administration: May apply for disability if have a Stage IV cancer. 405-588-1585 3466815007 ? [employer], Disability and Transit Services: Assists with nutrition, care and transit needs. [place] Support Programs: '@10RELATIVEDAYS'$ @ > Cancer Support Group  2nd Tuesday of the month 1pm-2pm, Journey Room  > Creative Journey  3rd Tuesday of the month 1130am-1pm, Journey Room  > Look Good Feel Better  1st Wednesday of the month 10am-12 noon, Journey Room (Call [place] to register [phone])

## 2016-06-12 NOTE — Patient Instructions (Signed)
[place] at [facility] Discharge Instructions  RECOMMENDATIONS MADE BY THE CONSULTANT AND ANY TEST RESULTS WILL BE SENT TO YOUR REFERRING PHYSICIAN.  You were seen today by Kirby Crigler PA-C. B12 every month. Port flush and labs today, will call with results. CT in about 6 weeks. Follow up in about 6 weeks.   Thank you for choosing [place] at [facility] to provide your oncology and hematology care.  To afford each patient quality time with our provider, please arrive at least 15 minutes before your scheduled appointment time.   Beginning January 23rd 2017 lab work for the [facility] will be done in the  Main lab at [facility] on 1st floor. If you have a lab appointment with the [facility] please come in thru the  Main Entrance and check in at the main information desk  You need to re-schedule your appointment should you arrive 10 or more minutes late.  We strive to give you quality time with our providers, and arriving late affects you and other patients whose appointments are after yours.  Also, if you no show three or more times for appointments you may be dismissed from the clinic at the providers discretion.     Again, thank you for choosing [facility].  Our hope is that these requests will decrease the amount of time that you wait before being seen by our physicians.       _____________________________________________________________  Should you have questions after your visit to [facility], please contact our office at [phone] between the hours of 8:30 a.m. and 4:30 p.m.  Voicemails left after 4:30 p.m. will not be returned until the following business day.  For prescription refill requests, have your pharmacy contact our office.         Resources For Cancer Patients and their Caregivers ? American Cancer Society: Can assist with transportation, wigs, general needs, runs Look Good Feel  Better.        3025141799 ? Cancer Care: Provides financial assistance, online support groups, medication/co-pay assistance.  1-800-813-HOPE 251 639 5438) ? [facility] Assists [place] cancer patients and their families through emotional , educational and financial support.  586-832-3449 ? [place] DSS Where to apply for food stamps, Medicaid and utility assistance. 406-141-4705 ? RCATS: Transportation to medical appointments. 925 050 7293 ? Social Security Administration: May apply for disability if have a Stage IV cancer. 203-352-4268 5348457018 ? [employer], Disability and Transit Services: Assists with nutrition, care and transit needs. [place] Support Programs: '@10RELATIVEDAYS'$ @ > Cancer Support Group  2nd Tuesday of the month 1pm-2pm, Journey Room  > Creative Journey  3rd Tuesday of the month 1130am-1pm, Journey Room  > Look Good Feel Better  1st Wednesday of the month 10am-12 noon, Journey Room (Call [place] to register [phone])

## 2016-06-12 NOTE — Progress Notes (Signed)
Janet Fletcher presented for Portacath access and flush. Portacath located right chest wall accessed with  H 20 needle. Good blood return present. Portacath flushed with 66m NS and 500U/56mHeparin and needle removed intact. Procedure without incident. Patient tolerated procedure well.  Labs drawn per orders

## 2016-06-12 NOTE — Assessment & Plan Note (Signed)
B12 deficiency with negative intrinsic factor and anti-parietal cell antibody testing on 12/15/2015.  B12 monthly

## 2016-06-20 ENCOUNTER — Encounter (HOSPITAL_BASED_OUTPATIENT_CLINIC_OR_DEPARTMENT_OTHER): Payer: Commercial Managed Care - HMO

## 2016-06-20 VITALS — BP 117/66 | HR 73 | Temp 97.4°F | Resp 18

## 2016-06-20 DIAGNOSIS — D649 Anemia, unspecified: Secondary | ICD-10-CM

## 2016-06-20 DIAGNOSIS — E538 Deficiency of other specified B group vitamins: Secondary | ICD-10-CM

## 2016-06-20 MED ORDER — DARBEPOETIN ALFA 40 MCG/0.4ML IJ SOSY
PREFILLED_SYRINGE | INTRAMUSCULAR | Status: AC
  Filled 2016-06-20: qty 0.4

## 2016-06-20 MED ORDER — DARBEPOETIN ALFA 40 MCG/0.4ML IJ SOSY
40.0000 ug | PREFILLED_SYRINGE | Freq: Once | INTRAMUSCULAR | Status: AC
  Administered 2016-06-20: 40 ug via SUBCUTANEOUS

## 2016-06-20 NOTE — Patient Instructions (Signed)
[place] at [facility] Discharge Instructions  RECOMMENDATIONS MADE BY THE CONSULTANT AND ANY TEST RESULTS WILL BE SENT TO YOUR REFERRING PHYSICIAN.  Aranesp 40 mcg injection given as ordered. Return every 2 weeks for lab and aranesp injection.  Thank you for choosing [place] at [facility] to provide your oncology and hematology care.  To afford each patient quality time with our provider, please arrive at least 15 minutes before your scheduled appointment time.   Beginning January 23rd 2017 lab work for the [facility] will be done in the  Main lab at [facility] on 1st floor. If you have a lab appointment with the [facility] please come in thru the  Main Entrance and check in at the main information desk  You need to re-schedule your appointment should you arrive 10 or more minutes late.  We strive to give you quality time with our providers, and arriving late affects you and other patients whose appointments are after yours.  Also, if you no show three or more times for appointments you may be dismissed from the clinic at the providers discretion.     Again, thank you for choosing [facility].  Our hope is that these requests will decrease the amount of time that you wait before being seen by our physicians.       _____________________________________________________________  Should you have questions after your visit to [facility], please contact our office at [phone] between the hours of 8:30 a.m. and 4:30 p.m.  Voicemails left after 4:30 p.m. will not be returned until the following business day.  For prescription refill requests, have your pharmacy contact our office.         Resources For Cancer Patients and their Caregivers ? American Cancer Society: Can assist with transportation, wigs, general needs, runs Look Good Feel Better.        775-150-4066 ? Cancer Care: Provides financial  assistance, online support groups, medication/co-pay assistance.  1-800-813-HOPE (437) 498-1958) ? [place] Assists [place] cancer patients and their families through emotional , educational and financial support.  971-433-0680 ? [place] DSS Where to apply for food stamps, Medicaid and utility assistance. 726-218-6243 ? RCATS: Transportation to medical appointments. 867-860-5106 ? Social Security Administration: May apply for disability if have a Stage IV cancer. 803 429 7402 628-188-4313 ? [employer], Disability and Transit Services: Assists with nutrition, care and transit needs. [place] Support Programs: '@10RELATIVEDAYS'$ @ > Cancer Support Group  2nd Tuesday of the month 1pm-2pm, Journey Room  > Creative Journey  3rd Tuesday of the month 1130am-1pm, Journey Room  > Look Good Feel Better  1st Wednesday of the month 10am-12 noon, Journey Room (Call [place] to register [phone])

## 2016-06-20 NOTE — Progress Notes (Signed)
Per Roderic Ovens to be given today based on hgb 8.3 on 06/12/16. Janet Fletcher presents today for injection per MD orders. Aranesp 40 mcg administered SQ in right Abdomen. Administration without incident. Patient tolerated well.

## 2016-06-29 ENCOUNTER — Other Ambulatory Visit (HOSPITAL_COMMUNITY): Payer: Self-pay | Admitting: Hematology & Oncology

## 2016-06-29 DIAGNOSIS — C3491 Malignant neoplasm of unspecified part of right bronchus or lung: Secondary | ICD-10-CM

## 2016-07-05 ENCOUNTER — Encounter (HOSPITAL_COMMUNITY): Payer: Commercial Managed Care - HMO | Attending: Hematology & Oncology

## 2016-07-05 ENCOUNTER — Encounter (HOSPITAL_COMMUNITY): Payer: Self-pay

## 2016-07-05 ENCOUNTER — Encounter (HOSPITAL_COMMUNITY): Payer: Commercial Managed Care - HMO

## 2016-07-05 VITALS — BP 113/64 | HR 90 | Temp 97.9°F | Resp 18

## 2016-07-05 DIAGNOSIS — C3491 Malignant neoplasm of unspecified part of right bronchus or lung: Secondary | ICD-10-CM | POA: Insufficient documentation

## 2016-07-05 DIAGNOSIS — E538 Deficiency of other specified B group vitamins: Secondary | ICD-10-CM | POA: Diagnosis not present

## 2016-07-05 DIAGNOSIS — D649 Anemia, unspecified: Secondary | ICD-10-CM

## 2016-07-05 LAB — COMPREHENSIVE METABOLIC PANEL
ALBUMIN: 3.5 g/dL (ref 3.5–5.0)
ALK PHOS: 48 U/L (ref 38–126)
ALT: 10 U/L — ABNORMAL LOW (ref 14–54)
ANION GAP: 7 (ref 5–15)
AST: 23 U/L (ref 15–41)
BUN: 25 mg/dL — ABNORMAL HIGH (ref 6–20)
CO2: 26 mmol/L (ref 22–32)
Calcium: 9.2 mg/dL (ref 8.9–10.3)
Chloride: 104 mmol/L (ref 101–111)
Creatinine, Ser: 1.34 mg/dL — ABNORMAL HIGH (ref 0.44–1.00)
GFR calc Af Amer: 43 mL/min — ABNORMAL LOW (ref >60)
GFR calc non Af Amer: 37 mL/min — ABNORMAL LOW (ref >60)
GLUCOSE: 93 mg/dL (ref 65–99)
POTASSIUM: 3.9 mmol/L (ref 3.5–5.1)
SODIUM: 137 mmol/L (ref 135–145)
Total Bilirubin: 0.4 mg/dL (ref 0.3–1.2)
Total Protein: 6.4 g/dL — ABNORMAL LOW (ref 6.5–8.1)

## 2016-07-05 LAB — CBC
HEMATOCRIT: 30.3 % — AB (ref 36.0–46.0)
HEMOGLOBIN: 9.8 g/dL — AB (ref 12.0–15.0)
MCH: 31 pg (ref 26.0–34.0)
MCHC: 32.3 g/dL (ref 30.0–36.0)
MCV: 95.9 fL (ref 78.0–100.0)
Platelets: 138 10*3/uL — ABNORMAL LOW (ref 150–400)
RBC: 3.16 MIL/uL — ABNORMAL LOW (ref 3.87–5.11)
RDW: 12.9 % (ref 11.5–15.5)
WBC: 3.2 10*3/uL — ABNORMAL LOW (ref 4.0–10.5)

## 2016-07-05 MED ORDER — CYANOCOBALAMIN 1000 MCG/ML IJ SOLN
INTRAMUSCULAR | Status: AC
  Filled 2016-07-05: qty 1

## 2016-07-05 MED ORDER — CYANOCOBALAMIN 1000 MCG/ML IJ SOLN
1000.0000 ug | Freq: Once | INTRAMUSCULAR | Status: AC
  Administered 2016-07-05: 1000 ug via INTRAMUSCULAR

## 2016-07-05 MED ORDER — DARBEPOETIN ALFA 40 MCG/0.4ML IJ SOSY
PREFILLED_SYRINGE | INTRAMUSCULAR | Status: AC
  Filled 2016-07-05: qty 0.4

## 2016-07-05 MED ORDER — DARBEPOETIN ALFA 40 MCG/0.4ML IJ SOSY
40.0000 ug | PREFILLED_SYRINGE | Freq: Once | INTRAMUSCULAR | Status: AC
  Administered 2016-07-05: 40 ug via SUBCUTANEOUS

## 2016-07-05 NOTE — Patient Instructions (Signed)
[place] at [facility] Discharge Instructions  RECOMMENDATIONS MADE BY THE CONSULTANT AND ANY TEST RESULTS WILL BE SENT TO YOUR REFERRING PHYSICIAN.  B12 injection today. Aranesp injection today. Return as scheduled for lab work and injections. Return as scheduled for office visit.  Thank you for choosing [place] at [facility] to provide your oncology and hematology care.  To afford each patient quality time with our provider, please arrive at least 15 minutes before your scheduled appointment time.   Beginning January 23rd 2017 lab work for the [facility] will be done in the  Main lab at [facility] on 1st floor. If you have a lab appointment with the [facility] please come in thru the  Main Entrance and check in at the main information desk  You need to re-schedule your appointment should you arrive 10 or more minutes late.  We strive to give you quality time with our providers, and arriving late affects you and other patients whose appointments are after yours.  Also, if you no show three or more times for appointments you may be dismissed from the clinic at the providers discretion.     Again, thank you for choosing [facility].  Our hope is that these requests will decrease the amount of time that you wait before being seen by our physicians.       _____________________________________________________________  Should you have questions after your visit to [facility], please contact our office at [phone] between the hours of 8:30 a.m. and 4:30 p.m.  Voicemails left after 4:30 p.m. will not be returned until the following business day.  For prescription refill requests, have your pharmacy contact our office.         Resources For Cancer Patients and their Caregivers ? American Cancer Society: Can assist with transportation, wigs, general needs, runs Look Good Feel Better.         ([phone] 641-6539 ? Cancer Care: Provides financial assistance, online support groups, medication/co-pay assistance.  1-800-813-HOPE 346-103-0327) ? [place] Assists [place] cancer patients and their families through emotional , educational and financial support.  973 318 2250 ? [place] DSS Where to apply for food stamps, Medicaid and utility assistance. 412 075 8168 ? [employer]: Transportation to medical appointments. (276)730-7665 ? Social Security Administration: May apply for disability if have a Stage IV cancer. 5196791493 (682)066-9373 ? [employer], Disability and Transit Services: Assists with nutrition, care and transit needs. [place] Support Programs: '@10RELATIVEDAYS'$ @ > Cancer Support Group  2nd Tuesday of the month 1pm-2pm, Journey Room  > Creative Journey  3rd Tuesday of the month 1130am-1pm, Journey Room  > Look Good Feel Better  1st Wednesday of the month 10am-12 noon, Journey Room (Call [place] to register [phone])

## 2016-07-05 NOTE — Progress Notes (Signed)
Janet Fletcher presents today for injection per the provider's orders.  B12 and Aranesp administration without incident; see MAR for injection details.  Patient tolerated procedure well and without incident.  No questions or complaints noted at this time.

## 2016-07-19 ENCOUNTER — Encounter (HOSPITAL_COMMUNITY): Payer: Commercial Managed Care - HMO | Attending: Hematology & Oncology

## 2016-07-19 ENCOUNTER — Encounter (HOSPITAL_COMMUNITY): Payer: Self-pay

## 2016-07-19 ENCOUNTER — Encounter (HOSPITAL_COMMUNITY): Payer: Commercial Managed Care - HMO

## 2016-07-19 VITALS — BP 108/62 | HR 76 | Temp 97.4°F | Resp 18

## 2016-07-19 DIAGNOSIS — E538 Deficiency of other specified B group vitamins: Secondary | ICD-10-CM

## 2016-07-19 DIAGNOSIS — C3491 Malignant neoplasm of unspecified part of right bronchus or lung: Secondary | ICD-10-CM | POA: Diagnosis not present

## 2016-07-19 DIAGNOSIS — D649 Anemia, unspecified: Secondary | ICD-10-CM

## 2016-07-19 LAB — CBC WITH DIFFERENTIAL/PLATELET
BASOS ABS: 0 10*3/uL (ref 0.0–0.1)
BASOS PCT: 0 %
Eosinophils Absolute: 0.1 10*3/uL (ref 0.0–0.7)
Eosinophils Relative: 2 %
HEMATOCRIT: 31.6 % — AB (ref 36.0–46.0)
HEMOGLOBIN: 10.4 g/dL — AB (ref 12.0–15.0)
Lymphocytes Relative: 16 %
Lymphs Abs: 0.4 10*3/uL — ABNORMAL LOW (ref 0.7–4.0)
MCH: 31.1 pg (ref 26.0–34.0)
MCHC: 32.9 g/dL (ref 30.0–36.0)
MCV: 94.6 fL (ref 78.0–100.0)
Monocytes Absolute: 0.3 10*3/uL (ref 0.1–1.0)
Monocytes Relative: 11 %
NEUTROS ABS: 1.8 10*3/uL (ref 1.7–7.7)
NEUTROS PCT: 71 %
Platelets: 131 10*3/uL — ABNORMAL LOW (ref 150–400)
RBC: 3.34 MIL/uL — ABNORMAL LOW (ref 3.87–5.11)
RDW: 12.8 % (ref 11.5–15.5)
WBC: 2.5 10*3/uL — ABNORMAL LOW (ref 4.0–10.5)

## 2016-07-19 MED ORDER — DARBEPOETIN ALFA 40 MCG/0.4ML IJ SOSY
PREFILLED_SYRINGE | INTRAMUSCULAR | Status: AC
  Filled 2016-07-19: qty 0.4

## 2016-07-19 MED ORDER — DARBEPOETIN ALFA 40 MCG/0.4ML IJ SOSY
40.0000 ug | PREFILLED_SYRINGE | Freq: Once | INTRAMUSCULAR | Status: AC
  Administered 2016-07-19: 40 ug via SUBCUTANEOUS

## 2016-07-19 NOTE — Patient Instructions (Signed)
[place] at [facility] Discharge Instructions  RECOMMENDATIONS MADE BY THE CONSULTANT AND ANY TEST RESULTS WILL BE SENT TO YOUR REFERRING PHYSICIAN.  Aranesp today.    Thank you for choosing [place] at [facility] to provide your oncology and hematology care.  To afford each patient quality time with our provider, please arrive at least 15 minutes before your scheduled appointment time.    If you have a lab appointment with the [facility] please come in thru the  Main Entrance and check in at the main information desk  You need to re-schedule your appointment should you arrive 10 or more minutes late.  We strive to give you quality time with our providers, and arriving late affects you and other patients whose appointments are after yours.  Also, if you no show three or more times for appointments you may be dismissed from the clinic at the providers discretion.     Again, thank you for choosing [facility].  Our hope is that these requests will decrease the amount of time that you wait before being seen by our physicians.       _____________________________________________________________  Should you have questions after your visit to [facility], please contact our office at [phone] between the hours of 8:30 a.m. and 4:30 p.m.  Voicemails left after 4:30 p.m. will not be returned until the following business day.  For prescription refill requests, have your pharmacy contact our office.       Resources For Cancer Patients and their Caregivers ? American Cancer Society: Can assist with transportation, wigs, general needs, runs Look Good Feel Better.        (505)347-9312 ? Cancer Care: Provides financial assistance, online support groups, medication/co-pay assistance.  1-800-813-HOPE 4303764769) ? [place] Assists [place] cancer patients and their families through emotional  , educational and financial support.  (518)855-1581 ? [place] DSS Where to apply for food stamps, Medicaid and utility assistance. (314) 572-3130 ? [employer]: Transportation to medical appointments. (912)591-3139 ? Social Security Administration: May apply for disability if have a Stage IV cancer. 6820376110 (802) 868-8169 ? [employer], Disability and Transit Services: Assists with nutrition, care and transit needs. [place] Support Programs: '@10RELATIVEDAYS'$ @ > Cancer Support Group  2nd Tuesday of the month 1pm-2pm, Journey Room  > Creative Journey  3rd Tuesday of the month 1130am-1pm, Journey Room  > Look Good Feel Better  1st Wednesday of the month 10am-12 noon, Journey Room (Call [place] to register [phone])

## 2016-07-19 NOTE — Progress Notes (Signed)
Janet Fletcher presents today for injection per MD orders. Aranesp 40 administered SQ in right Abdomen. Administration without incident. Patient tolerated well. Note left for Angie to call Levada Dy, the patient's daughter, regarding whether the authorization for her upcoming appointment on 1/3.

## 2016-07-24 ENCOUNTER — Other Ambulatory Visit (HOSPITAL_COMMUNITY): Payer: Self-pay | Admitting: Oncology

## 2016-07-24 ENCOUNTER — Ambulatory Visit (HOSPITAL_COMMUNITY)
Admission: RE | Admit: 2016-07-24 | Discharge: 2016-07-24 | Disposition: A | Discharge status: Home / Self Care | Payer: Commercial Managed Care - HMO | Source: Ambulatory Visit | Attending: Oncology | Admitting: Oncology

## 2016-07-24 DIAGNOSIS — I2510 Atherosclerotic heart disease of native coronary artery without angina pectoris: Secondary | ICD-10-CM | POA: Diagnosis not present

## 2016-07-24 DIAGNOSIS — I700 Atherosclerosis of aorta: Secondary | ICD-10-CM | POA: Diagnosis not present

## 2016-07-24 DIAGNOSIS — C3490 Malignant neoplasm of unspecified part of unspecified bronchus or lung: Secondary | ICD-10-CM | POA: Diagnosis not present

## 2016-07-24 DIAGNOSIS — C3491 Malignant neoplasm of unspecified part of right bronchus or lung: Secondary | ICD-10-CM | POA: Diagnosis not present

## 2016-07-24 MED ORDER — IOPAMIDOL (ISOVUE-300) INJECTION 61%
80.0000 mL | Freq: Once | INTRAVENOUS | Status: AC | PRN
  Administered 2016-07-24: 75 mL via INTRAVENOUS

## 2016-07-26 ENCOUNTER — Encounter (HOSPITAL_BASED_OUTPATIENT_CLINIC_OR_DEPARTMENT_OTHER): Payer: Medicare HMO | Admitting: Hematology & Oncology

## 2016-07-26 ENCOUNTER — Telehealth (HOSPITAL_COMMUNITY): Payer: Self-pay | Admitting: *Deleted

## 2016-07-26 ENCOUNTER — Encounter (HOSPITAL_COMMUNITY): Payer: Medicare HMO | Attending: Hematology & Oncology

## 2016-07-26 ENCOUNTER — Ambulatory Visit (HOSPITAL_COMMUNITY)
Admission: RE | Admit: 2016-07-26 | Discharge: 2016-07-26 | Disposition: A | Discharge status: Home / Self Care | Payer: Medicare HMO | Source: Ambulatory Visit | Attending: Oncology | Admitting: Oncology

## 2016-07-26 ENCOUNTER — Encounter (HOSPITAL_COMMUNITY): Payer: Self-pay | Admitting: Hematology & Oncology

## 2016-07-26 ENCOUNTER — Ambulatory Visit (HOSPITAL_COMMUNITY): Payer: Commercial Managed Care - HMO | Admitting: Hematology & Oncology

## 2016-07-26 ENCOUNTER — Ambulatory Visit (HOSPITAL_COMMUNITY)
Admission: RE | Admit: 2016-07-26 | Discharge: 2016-07-26 | Disposition: A | Discharge status: Home / Self Care | Payer: Medicare HMO | Source: Ambulatory Visit | Attending: Hematology & Oncology | Admitting: Hematology & Oncology

## 2016-07-26 VITALS — Wt 121.5 lb

## 2016-07-26 DIAGNOSIS — D649 Anemia, unspecified: Secondary | ICD-10-CM

## 2016-07-26 DIAGNOSIS — M8580 Other specified disorders of bone density and structure, unspecified site: Secondary | ICD-10-CM | POA: Insufficient documentation

## 2016-07-26 DIAGNOSIS — R530 Neoplastic (malignant) related fatigue: Secondary | ICD-10-CM | POA: Diagnosis not present

## 2016-07-26 DIAGNOSIS — M79601 Pain in right arm: Secondary | ICD-10-CM | POA: Insufficient documentation

## 2016-07-26 DIAGNOSIS — C3491 Malignant neoplasm of unspecified part of right bronchus or lung: Secondary | ICD-10-CM | POA: Insufficient documentation

## 2016-07-26 DIAGNOSIS — Z87891 Personal history of nicotine dependence: Secondary | ICD-10-CM

## 2016-07-26 DIAGNOSIS — Z8249 Family history of ischemic heart disease and other diseases of the circulatory system: Secondary | ICD-10-CM | POA: Insufficient documentation

## 2016-07-26 DIAGNOSIS — C3490 Malignant neoplasm of unspecified part of unspecified bronchus or lung: Secondary | ICD-10-CM | POA: Diagnosis not present

## 2016-07-26 DIAGNOSIS — F17210 Nicotine dependence, cigarettes, uncomplicated: Secondary | ICD-10-CM | POA: Insufficient documentation

## 2016-07-26 DIAGNOSIS — Z9221 Personal history of antineoplastic chemotherapy: Secondary | ICD-10-CM | POA: Diagnosis not present

## 2016-07-26 DIAGNOSIS — M5134 Other intervertebral disc degeneration, thoracic region: Secondary | ICD-10-CM | POA: Diagnosis not present

## 2016-07-26 DIAGNOSIS — Z809 Family history of malignant neoplasm, unspecified: Secondary | ICD-10-CM | POA: Insufficient documentation

## 2016-07-26 DIAGNOSIS — D61810 Antineoplastic chemotherapy induced pancytopenia: Secondary | ICD-10-CM | POA: Diagnosis not present

## 2016-07-26 DIAGNOSIS — Z9889 Other specified postprocedural states: Secondary | ICD-10-CM | POA: Insufficient documentation

## 2016-07-26 DIAGNOSIS — D509 Iron deficiency anemia, unspecified: Secondary | ICD-10-CM | POA: Diagnosis not present

## 2016-07-26 DIAGNOSIS — Z95828 Presence of other vascular implants and grafts: Secondary | ICD-10-CM

## 2016-07-26 DIAGNOSIS — M7530 Calcific tendinitis of unspecified shoulder: Secondary | ICD-10-CM | POA: Insufficient documentation

## 2016-07-26 DIAGNOSIS — E538 Deficiency of other specified B group vitamins: Secondary | ICD-10-CM

## 2016-07-26 DIAGNOSIS — Z923 Personal history of irradiation: Secondary | ICD-10-CM | POA: Insufficient documentation

## 2016-07-26 DIAGNOSIS — M79621 Pain in right upper arm: Secondary | ICD-10-CM | POA: Diagnosis not present

## 2016-07-26 LAB — COMPREHENSIVE METABOLIC PANEL
ALK PHOS: 49 U/L (ref 38–126)
ALT: 12 U/L — AB (ref 14–54)
AST: 21 U/L (ref 15–41)
Albumin: 3.6 g/dL (ref 3.5–5.0)
Anion gap: 7 (ref 5–15)
BILIRUBIN TOTAL: 0.4 mg/dL (ref 0.3–1.2)
BUN: 19 mg/dL (ref 6–20)
CALCIUM: 8.7 mg/dL — AB (ref 8.9–10.3)
CO2: 25 mmol/L (ref 22–32)
CREATININE: 1.08 mg/dL — AB (ref 0.44–1.00)
Chloride: 105 mmol/L (ref 101–111)
GFR, EST AFRICAN AMERICAN: 55 mL/min — AB (ref >60)
GFR, EST NON AFRICAN AMERICAN: 48 mL/min — AB (ref >60)
Glucose, Bld: 111 mg/dL — ABNORMAL HIGH (ref 65–99)
Potassium: 3.7 mmol/L (ref 3.5–5.1)
Sodium: 137 mmol/L (ref 135–145)
TOTAL PROTEIN: 6.3 g/dL — AB (ref 6.5–8.1)

## 2016-07-26 LAB — CBC WITH DIFFERENTIAL/PLATELET
Basophils Absolute: 0 10*3/uL (ref 0.0–0.1)
Basophils Relative: 0 %
Eosinophils Absolute: 0.1 10*3/uL (ref 0.0–0.7)
Eosinophils Relative: 3 %
HEMATOCRIT: 31.4 % — AB (ref 36.0–46.0)
HEMOGLOBIN: 10.2 g/dL — AB (ref 12.0–15.0)
LYMPHS ABS: 0.5 10*3/uL — AB (ref 0.7–4.0)
LYMPHS PCT: 20 %
MCH: 30.7 pg (ref 26.0–34.0)
MCHC: 32.5 g/dL (ref 30.0–36.0)
MCV: 94.6 fL (ref 78.0–100.0)
MONOS PCT: 10 %
Monocytes Absolute: 0.2 10*3/uL (ref 0.1–1.0)
NEUTROS PCT: 67 %
Neutro Abs: 1.7 10*3/uL (ref 1.7–7.7)
Platelets: 143 10*3/uL — ABNORMAL LOW (ref 150–400)
RBC: 3.32 MIL/uL — AB (ref 3.87–5.11)
RDW: 13 % (ref 11.5–15.5)
WBC: 2.5 10*3/uL — AB (ref 4.0–10.5)

## 2016-07-26 MED ORDER — GADOBENATE DIMEGLUMINE 529 MG/ML IV SOLN
5.0000 mL | Freq: Once | INTRAVENOUS | Status: AC | PRN
  Administered 2016-07-26: 5 mL via INTRAVENOUS

## 2016-07-26 MED ORDER — HEPARIN SOD (PORK) LOCK FLUSH 100 UNIT/ML IV SOLN
INTRAVENOUS | Status: AC
  Filled 2016-07-26: qty 5

## 2016-07-26 MED ORDER — HEPARIN SOD (PORK) LOCK FLUSH 100 UNIT/ML IV SOLN
500.0000 [IU] | Freq: Once | INTRAVENOUS | Status: AC
  Administered 2016-07-26: 500 [IU] via INTRAVENOUS

## 2016-07-26 MED ORDER — SODIUM CHLORIDE 0.9% FLUSH
10.0000 mL | INTRAVENOUS | Status: DC | PRN
Start: 2016-07-26 — End: 2016-07-26
  Administered 2016-07-26: 10 mL via INTRAVENOUS
  Filled 2016-07-26: qty 10

## 2016-07-26 MED ORDER — CYANOCOBALAMIN 1000 MCG/ML IJ SOLN
INTRAMUSCULAR | Status: AC
  Filled 2016-07-26: qty 1

## 2016-07-26 NOTE — Telephone Encounter (Signed)
Message left on patient's cell phone letting her know that Dr. Mamie Nick thought that she had tendinitis.

## 2016-07-26 NOTE — Progress Notes (Signed)
Janet Fletcher presented for Portacath access and flush. Portacath located right chest wall accessed with  H 20 needle.  Good blood return present. Portacath flushed with 50m NS and 500U/584mHeparin and needle removed intact.  Procedure tolerated well and without incident.

## 2016-07-26 NOTE — Progress Notes (Signed)
[facility] NOTE  Patient Care Team: Chevis Pretty, FNP as PCP - General (Nurse Practitioner)  CHIEF COMPLAINTS/PURPOSE OF CONSULTATION:  Limited stage small cell lung cancer    Small cell carcinoma of right lung ([place])   11/28/2015 Imaging    Large anterior mediastinal mass in continuity with a R hilar mass, narrowing of SVC, abnormal densities in RUL, 9 mm spiculated nodule, nonspec. hypodensity in liver      12/05/2015 PET scan    Large hypermetabolic paratracheal mass c/w SCLC, perihilar nodular densities in RML, mild metabolic activity RUL nodule, no distant metastatic disease      12/11/2015 Procedure    Video bronch with biopsies and brushings, endobronchial ultrasound with mediastinal LN aspiration. Dr. Roxan Hockey      12/11/2015 Pathology Results    Trachea biopsy negative, FNA RUL malignant cells c/w SCLC      12/13/2015 - 03/15/2016 Chemotherapy    Cisplatin/Etoposide x 5 cycles.  Cycle #6 cancelled due to significant cytopenias      12/25/2015 Imaging    MRI brain- No intracranial parenchymal enhancing lesion. Tiny right frontal calvarial enhancing lesion. Small metastatic lesion not excluded although this may represent an incidentally detected benign process.      12/29/2015 - 02/13/2016 Radiation Therapy    XRT      02/14/2016 Treatment Plan Change    Chemotherapy deferred x 1 week      02/21/2016 Treatment Plan Change    Carboplatin dose reduced by 20% and Etoposide dose reduced by 10%.      04/24/2016 Imaging    CT CAP- Chest Impression:  1. Marked reduction in mediastinal and RIGHT hilar lymphadenopathy with no residual pathologically enlarged nodes. 2. Resolution of perihilar nodularity in the RIGHT lung which was hypermetabolic on FDG PET. 3. Persistent nodule in the RIGHT upper lobe which is not hypermetabolic. 4. New ground-glass nodule in the RIGHT upper lobe is nonspecific and may relate to  therapy.  Abdomen / Pelvis Impression:  1. No evidence of metastatic disease in the abdomen pelvis. 2. Atherosclerotic calcification of the aorta.      04/24/2016 Imaging    Bone scan- Nonspecific increased tracer localization at approximately T11 vertebral body; this is of uncertain etiology, could represent a degenerative process though metastatic disease is not excluded.      04/29/2016 -  Radiation Therapy    PCI      07/24/2016 Imaging    CT CAP- 1. There has been interval decrease in size of mediastinal lymph nodes. The index lesion within the right upper lobe is also decreased in size from previous exam. 2. New subpleural nodule is noted within the posteromedial right lower lobe. Attention on follow-up imaging advised. 3. Stable ground-glass attenuating nodule within the right lower lobe and anteromedial left upper lobe 4. Aortic atherosclerosis and coronary artery calcification        HISTORY OF PRESENTING ILLNESS:  Janet Fletcher 81 y.o. female is here for ongoing follow-up of limited stage SCLC.  Janet Fletcher is accompanied by her daughters. I have reviewed the scans with the patient.   She is having pain in her right bicep. She can't lay on it at night and it hurts when she lifts anything or lifts up her arm. This started about 3 weeks ago. She did not have a fall.   Her breathing is good. Denies any other complaints. Her appetite and mood have been good. She notes that the holidays  went well.   She goes back to radiation in February.   She hasn't gotten any hair back yet. This distresses her some.   She has quit smoking. Her family is obviously pleased with this. She denies headaches, blurry vision. No cough.   MEDICAL HISTORY:  Past Medical History:  Diagnosis Date  . Allergy   . Cancer ([place])    lung  . Hyperlipidemia   . Iron deficiency anemia 12/15/2015  . Low vitamin B12 level 12/15/2015  . Osteopenia     SURGICAL HISTORY: Past Surgical History:   Procedure Laterality Date  . CATARACT EXTRACTION, BILATERAL Bilateral   . MINOR EXCISION EAR CANAL CYST Left    30+ years ago  . SMALL INTESTINE SURGERY    . VIDEO BRONCHOSCOPY WITH ENDOBRONCHIAL ULTRASOUND N/A 12/11/2015   Procedure: VIDEO BRONCHOSCOPY WITH ENDOBRONCHIAL ULTRASOUND;  Surgeon: Melrose Nakayama, MD;  Location: [facility];  Service: Thoracic;  Laterality: N/A;    SOCIAL HISTORY: Social History   Social History  . Marital status: Married    Spouse name: N/A  . Number of children: N/A  . Years of education: N/A   Occupational History  . Not on file.   Social History Main Topics  . Smoking status: Current Every Day Smoker    Packs/day: 0.25    Years: 50.00  . Smokeless tobacco: Never Used  . Alcohol use No  . Drug use: No  . Sexual activity: Not on file   Other Topics Concern  . Not on file   Social History Narrative  . No narrative on file  Married; 51 years 5 daughters 2 sons 12 grandchildren, 8 great grandchildren Smokes now but not as much; started long time ago. 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 Cancer Mother   . Hypertension Father   Mother died at 12 years old. Father died at 56 years old. 7 sisters and 7 brothers 40 living Older sister died of leukemia.   ALLERGIES:  is allergic to acetaminophen; flagyl [metronidazole hcl]; and penicillins.  MEDICATIONS:  Current Outpatient Prescriptions  Medication Sig Dispense Refill  . granisetron (SANCUSO) 3.1 MG/24HR Apply to skin starting 24 hours before chemotherapy. Remove after 7 days. 3 patch 0  . KLOR-CON M20 20 MEQ tablet TAKE 1 TABLET (20 MEQ TOTAL) BY MOUTH 2 (TWO) TIMES DAILY. 30 tablet 0  . lidocaine-prilocaine (EMLA) cream Apply a quarter size amount to port site 1 hour prior to chemo. Do not rub in. Cover with plastic wrap. 30 g 3   . MAG64 535 (64 Mg) MG TBCR TAKE 1 TABLET (64 MG TOTAL) BY MOUTH 3 (THREE) TIMES DAILY.  1  . magnesium chloride (SLOW-MAG) 64 MG TBEC SR tablet Take 1 tablet (64 mg total) by mouth 3 (three) times daily. 90 tablet 1  . omeprazole (PRILOSEC) 40 MG capsule TAKE 1 CAPSULE (40 MG TOTAL) BY MOUTH DAILY. 30 capsule 3  . omeprazole (PRILOSEC) 40 MG capsule TAKE 1 CAPSULE (40 MG TOTAL) BY MOUTH DAILY. 30 capsule 3  . omeprazole (PRILOSEC) 40 MG capsule TAKE 1 CAPSULE (40 MG TOTAL) BY MOUTH DAILY. 30 capsule 3  . ondansetron (ZOFRAN) 8 MG tablet TAKE 1 TABLET (8 MG TOTAL) BY MOUTH EVERY 8 (EIGHT) HOURS AS NEEDED FOR NAUSEA OR VOMITING. 30 tablet 2  . Polysacchar Iron-FA-B12 ([place]) 150-1-25  MG-MG-MCG CAPS Take 1 capsule by mouth daily. 30 capsule 5  . prochlorperazine (COMPAZINE) 10 MG tablet Take 1 tablet (10 mg total) by mouth every 6 (six) hours as needed for nausea or vomiting. 30 tablet 2  . simvastatin (ZOCOR) 40 MG tablet TAKE 1 TABLET (40 MG TOTAL) BY MOUTH AT BEDTIME. 90 tablet 0   No current facility-administered medications for this visit.     Review of Systems  Constitutional: Negative.   HENT: Negative.   Eyes: Negative.   Respiratory: Negative.  Negative for cough and shortness of breath.   Cardiovascular: Negative.   Gastrointestinal: Negative.   Genitourinary: Negative.   Musculoskeletal:       Right arm pain  Skin: Negative.   Neurological: Negative.   Endo/Heme/Allergies: Negative.   Psychiatric/Behavioral: Negative.  Negative for depression.  All other systems reviewed and are negative.  14 point ROS was done and is otherwise as detailed above or in HPI   PHYSICAL EXAMINATION: ECOG PERFORMANCE STATUS: 1 - Symptomatic but completely ambulatory   Vitals with BMI 07/26/2016  Height   Weight 121 lbs 8 oz  BMI   Systolic   Diastolic   Pulse   Respirations     Physical Exam  Constitutional: She is oriented to person, place, and time and well-developed,  well-nourished, and in no distress.  Pt was able to get on exam table without assistance.  Alopecia   HENT:  Head: Normocephalic and atraumatic.  Mouth/Throat: No oropharyngeal exudate.  Eyes: EOM are normal. Pupils are equal, round, and reactive to light. No scleral icterus.  Neck: Normal range of motion. Neck supple.  Cardiovascular: Normal rate, regular rhythm and normal heart sounds.   Pulmonary/Chest: Effort normal and breath sounds normal. No respiratory distress.  Abdominal: Soft. Bowel sounds are normal. She exhibits no distension and no mass. There is no tenderness. There is no rebound and no guarding.  Musculoskeletal: Normal range of motion.  R arm without obvious deformity. Palpation of RUE without palpable abnormality  Lymphadenopathy:    She has no cervical adenopathy.  Neurological: She is alert and oriented to person, place, and time. Gait normal.  Skin: Skin is warm and dry.  Psychiatric: Mood, memory, affect and judgment normal.  Nursing note and vitals reviewed.  LABORATORY DATA:  I have reviewed the data as listed  Results for Janet, Fletcher (MRN [id number]) as of 07/26/2016 09:03  Ref. Range 07/19/2016 08:04  WBC Latest Ref Range: 4.0 - 10.5 K/uL 2.5 (L)  RBC Latest Ref Range: 3.87 - 5.11 MIL/uL 3.34 (L)  Hemoglobin Latest Ref Range: 12.0 - 15.0 g/dL 10.4 (L)  HCT Latest Ref Range: 36.0 - 46.0 % 31.6 (L)  MCV Latest Ref Range: 78.0 - 100.0 fL 94.6  MCH Latest Ref Range: 26.0 - 34.0 pg 31.1  MCHC Latest Ref Range: 30.0 - 36.0 g/dL 32.9  RDW Latest Ref Range: 11.5 - 15.5 % 12.8  Platelets Latest Ref Range: 150 - 400 K/uL 131 (L)  Neutrophils Latest Units: % 71  Lymphocytes Latest Units: % 16  Monocytes Relative Latest Units: % 11  Eosinophil Latest Units: % 2  Basophil Latest Units: % 0  NEUT# Latest Ref Range: 1.7 - 7.7 K/uL 1.8  Lymphocyte # Latest Ref Range: 0.7 - 4.0 K/uL 0.4 (L)  Monocyte # Latest Ref Range: 0.1 - 1.0 K/uL 0.3  Eosinophils Absolute  Latest Ref Range: 0.0 - 0.7 K/uL 0.1  Basophils Absolute Latest Ref Range: 0.0 - 0.1 K/uL 0.0  RADIOGRAPHIC STUDIES: I have personally reviewed the radiological images as listed and agreed with the findings in the report. Ct Chest W Contrast  Result Date: 07/24/2016 CLINICAL DATA:  Small cell lung cancer followup EXAM: CT CHEST AND ABDOMEN WITH CONTRAST TECHNIQUE: Multidetector CT imaging of the chest and abdomen was performed following the standard protocol during bolus administration of intravenous contrast. CONTRAST:  66m ISOVUE-300 IOPAMIDOL (ISOVUE-300) INJECTION 61% COMPARISON:  04/24/2016 FINDINGS: CT CHEST FINDINGS Cardiovascular: Normal heart size. No pericardial effusion. Aortic atherosclerosis. Calcifications involving the RCA, LAD and left circumflex coronary artery noted. Mediastinum/Nodes: The trachea appears patent and is midline. Normal appearance of the esophagus. Right paratracheal lymph node measures 9 mm, image 27 of series 2. Previously 11 mm. Sub- carinal lymph node measures 9 mm, image 32 of series 2. Previously 11 mm. Lungs/Pleura: No pleural effusion. Moderate to advanced changes of centrilobular emphysema. There is an anteromedial right upper lobe lung nodule which measures 0.6 x 1.2 cm previously 1.3 x 1.1 cm. Stable ground-glass nodule within the right upper lobe measuring 9 mm, image 71 of series 4. 5 mm subpleural nodule within the posteromedial right lower lobe is new from previous exam, image 115 of series 4. Anteromedial left upper lobe ground-glass nodule is identified, image 44 of series 4. Stable from previous exam. Musculoskeletal: No aggressive lytic or sclerotic bone lesions identified. CT ABDOMEN FINDINGS Hepatobiliary: No focal liver abnormality is seen. No gallstones, gallbladder wall thickening, or biliary dilatation. Pancreas: Unremarkable. No pancreatic ductal dilatation or surrounding inflammatory changes. Spleen: Normal in size without focal abnormality.  Adrenals/Urinary Tract: Adrenal glands are unremarkable. Small nailing metric low-attenuation lesion within the upper pole of the right kidney is too small to reliably characterize. No mass or hydronephrosis identified. Stomach/Bowel: Stomach is within normal limits. Appendix appears normal. No evidence of bowel wall thickening, distention, or inflammatory changes. Vascular/Lymphatic: Aortic atherosclerosis. No enlarged abdominal or pelvic lymph nodes. Other: No abdominal wall hernia or abnormality. No abdominopelvic ascites. Musculoskeletal: Degenerative disc disease noted within the lumbar spine. No aggressive lytic or sclerotic bone lesions. IMPRESSION: 1. There has been interval decrease in size of mediastinal lymph nodes. The index lesion within the right upper lobe is also decreased in size from previous exam. 2. New subpleural nodule is noted within the posteromedial right lower lobe. Attention on follow-up imaging advised. 3. Stable ground-glass attenuating nodule within the right lower lobe and anteromedial left upper lobe 4. Aortic atherosclerosis and coronary artery calcification Electronically Signed   By: TKerby MoorsM.D.   On: 07/24/2016 12:01   Ct Abdomen W Contrast  Result Date: 07/24/2016 CLINICAL DATA:  Small cell lung cancer followup EXAM: CT CHEST AND ABDOMEN WITH CONTRAST TECHNIQUE: Multidetector CT imaging of the chest and abdomen was performed following the standard protocol during bolus administration of intravenous contrast. CONTRAST:  714mISOVUE-300 IOPAMIDOL (ISOVUE-300) INJECTION 61% COMPARISON:  04/24/2016 FINDINGS: CT CHEST FINDINGS Cardiovascular: Normal heart size. No pericardial effusion. Aortic atherosclerosis. Calcifications involving the RCA, LAD and left circumflex coronary artery noted. Mediastinum/Nodes: The trachea appears patent and is midline. Normal appearance of the esophagus. Right paratracheal lymph node measures 9 mm, image 27 of series 2. Previously 11 mm. Sub-  carinal lymph node measures 9 mm, image 32 of series 2. Previously 11 mm. Lungs/Pleura: No pleural effusion. Moderate to advanced changes of centrilobular emphysema. There is an anteromedial right upper lobe lung nodule which measures 0.6 x 1.2 cm previously 1.3 x 1.1 cm. Stable ground-glass nodule within the right upper  lobe measuring 9 mm, image 71 of series 4. 5 mm subpleural nodule within the posteromedial right lower lobe is new from previous exam, image 115 of series 4. Anteromedial left upper lobe ground-glass nodule is identified, image 44 of series 4. Stable from previous exam. Musculoskeletal: No aggressive lytic or sclerotic bone lesions identified. CT ABDOMEN FINDINGS Hepatobiliary: No focal liver abnormality is seen. No gallstones, gallbladder wall thickening, or biliary dilatation. Pancreas: Unremarkable. No pancreatic ductal dilatation or surrounding inflammatory changes. Spleen: Normal in size without focal abnormality. Adrenals/Urinary Tract: Adrenal glands are unremarkable. Small nailing metric low-attenuation lesion within the upper pole of the right kidney is too small to reliably characterize. No mass or hydronephrosis identified. Stomach/Bowel: Stomach is within normal limits. Appendix appears normal. No evidence of bowel wall thickening, distention, or inflammatory changes. Vascular/Lymphatic: Aortic atherosclerosis. No enlarged abdominal or pelvic lymph nodes. Other: No abdominal wall hernia or abnormality. No abdominopelvic ascites. Musculoskeletal: Degenerative disc disease noted within the lumbar spine. No aggressive lytic or sclerotic bone lesions. IMPRESSION: 1. There has been interval decrease in size of mediastinal lymph nodes. The index lesion within the right upper lobe is also decreased in size from previous exam. 2. New subpleural nodule is noted within the posteromedial right lower lobe. Attention on follow-up imaging advised. 3. Stable ground-glass attenuating nodule within the  right lower lobe and anteromedial left upper lobe 4. Aortic atherosclerosis and coronary artery calcification Electronically Signed   By: Kerby Moors M.D.   On: 07/24/2016 12:01    PATHOLOGY    ASSESSMENT & PLAN:  Limited stage small cell lung cancer Tobacco Abuse Anemia Low B12 Iron deficiency Chemotherapy induced pancyopenia Chemotherapy induced fatigue RUE pain  CT imaging was reviewed with the patient and her family. She is doing well. Will continue with ongoing observation. I have ordered repeat scans in 2 months.   I will order an X ray for today for her R arm pain. Nothing obvious on PE.   Port flushed today. She has quit smoking, this was of course encouraged.   She will return for a follow up after her scans in 2 months.   Orders Placed This Encounter  Procedures  . CT Chest W Contrast    Standing Status:   Future    Standing Expiration Date:   07/26/2017    Order Specific Question:   If indicated for the ordered procedure, I authorize the administration of contrast media per Radiology protocol    Answer:   Yes    Order Specific Question:   Reason for Exam (SYMPTOM  OR DIAGNOSIS REQUIRED)    Answer:   restaging SCLC    Order Specific Question:   Preferred imaging location?    Answer:   [facility]  . DG Humerus Right    Standing Status:   Future    Number of Occurrences:   1    Standing Expiration Date:   09/23/2017    Order Specific Question:   Reason for Exam (SYMPTOM  OR DIAGNOSIS REQUIRED)    Answer:   R arm pain    Order Specific Question:   Preferred imaging location?    Answer:   [facility]    All questions were answered. The patient knows to call the clinic with any problems, questions or concerns.  This document serves as a record of services personally performed by Ancil Linsey, MD. It was created on her behalf by Martinique Casey, a trained medical scribe. The creation of this  record is based on the scribe's personal observations and  the provider's statements to them. This document has been checked and approved by the attending provider.  I have reviewed the above documentation for accuracy and completeness, and I agree with the above.  This note was electronically signed.  Molli Hazard, MD  07/26/2016 9:02 AM

## 2016-07-26 NOTE — Addendum Note (Signed)
Addended by: Joie Bimler on: 07/26/2016 03:32 PM   Modules accepted: Orders

## 2016-07-26 NOTE — Patient Instructions (Signed)
[place] at [facility] Discharge Instructions  RECOMMENDATIONS MADE BY THE CONSULTANT AND ANY TEST RESULTS WILL BE SENT TO YOUR REFERRING PHYSICIAN.  You were seen today by Dr. Youlanda Roys will have xrays taken today, it will be Monday before you get the results Continue port flushes every 8 weeks Follow up in clinic with lab work after CT scan  Thank you for choosing [place] at [facility] to provide your oncology and hematology care.  To afford each patient quality time with our provider, please arrive at least 15 minutes before your scheduled appointment time.    If you have a lab appointment with the [facility] please come in thru the  Main Entrance and check in at the main information desk  You need to re-schedule your appointment should you arrive 10 or more minutes late.  We strive to give you quality time with our providers, and arriving late affects you and other patients whose appointments are after yours.  Also, if you no show three or more times for appointments you may be dismissed from the clinic at the providers discretion.     Again, thank you for choosing [facility].  Our hope is that these requests will decrease the amount of time that you wait before being seen by our physicians.       _____________________________________________________________  Should you have questions after your visit to [facility], please contact our office at [phone] between the hours of 8:30 a.m. and 4:30 p.m.  Voicemails left after 4:30 p.m. will not be returned until the following business day.  For prescription refill requests, have your pharmacy contact our office.       Resources For Cancer Patients and their Caregivers ? American Cancer Society: Can assist with transportation, wigs, general needs, runs Look Good Feel Better.        828-866-5192 ? Cancer Care: Provides financial assistance, online  support groups, medication/co-pay assistance.  1-800-813-HOPE 404-489-1704) ? [place] Assists [place] cancer patients and their families through emotional , educational and financial support.  830-507-9393 ? [place] DSS Where to apply for food stamps, Medicaid and utility assistance. (778)254-2959 ? RCATS: Transportation to medical appointments. (928)151-6650 ? Social Security Administration: May apply for disability if have a Stage IV cancer. (412)479-2411 339-281-8384 ? [employer], Disability and Transit Services: Assists with nutrition, care and transit needs. [place] Support Programs: '@10RELATIVEDAYS'$ @ > Cancer Support Group  2nd Tuesday of the month 1pm-2pm, Journey Room  > Creative Journey  3rd Tuesday of the month 1130am-1pm, Journey Room  > Look Good Feel Better  1st Wednesday of the month 10am-12 noon, Journey Room (Call [place] to register [phone])

## 2016-08-05 ENCOUNTER — Encounter (HOSPITAL_COMMUNITY): Payer: Medicare HMO

## 2016-08-05 ENCOUNTER — Encounter (HOSPITAL_BASED_OUTPATIENT_CLINIC_OR_DEPARTMENT_OTHER): Payer: Medicare HMO

## 2016-08-05 VITALS — BP 127/69 | HR 71 | Temp 97.6°F | Resp 14 | Wt 116.4 lb

## 2016-08-05 DIAGNOSIS — E538 Deficiency of other specified B group vitamins: Secondary | ICD-10-CM

## 2016-08-05 DIAGNOSIS — C3491 Malignant neoplasm of unspecified part of right bronchus or lung: Secondary | ICD-10-CM | POA: Diagnosis not present

## 2016-08-05 DIAGNOSIS — D509 Iron deficiency anemia, unspecified: Secondary | ICD-10-CM | POA: Diagnosis not present

## 2016-08-05 DIAGNOSIS — Z923 Personal history of irradiation: Secondary | ICD-10-CM | POA: Diagnosis not present

## 2016-08-05 DIAGNOSIS — Z8249 Family history of ischemic heart disease and other diseases of the circulatory system: Secondary | ICD-10-CM | POA: Diagnosis not present

## 2016-08-05 DIAGNOSIS — Z809 Family history of malignant neoplasm, unspecified: Secondary | ICD-10-CM | POA: Diagnosis not present

## 2016-08-05 DIAGNOSIS — M8580 Other specified disorders of bone density and structure, unspecified site: Secondary | ICD-10-CM | POA: Diagnosis not present

## 2016-08-05 DIAGNOSIS — D649 Anemia, unspecified: Secondary | ICD-10-CM | POA: Diagnosis not present

## 2016-08-05 DIAGNOSIS — F17210 Nicotine dependence, cigarettes, uncomplicated: Secondary | ICD-10-CM | POA: Diagnosis not present

## 2016-08-05 DIAGNOSIS — Z9889 Other specified postprocedural states: Secondary | ICD-10-CM | POA: Diagnosis not present

## 2016-08-05 DIAGNOSIS — Z9221 Personal history of antineoplastic chemotherapy: Secondary | ICD-10-CM | POA: Diagnosis not present

## 2016-08-05 LAB — CBC WITH DIFFERENTIAL/PLATELET
BASOS PCT: 0 %
Basophils Absolute: 0 10*3/uL (ref 0.0–0.1)
Eosinophils Absolute: 0.1 10*3/uL (ref 0.0–0.7)
Eosinophils Relative: 3 %
HEMATOCRIT: 31.6 % — AB (ref 36.0–46.0)
HEMOGLOBIN: 10.2 g/dL — AB (ref 12.0–15.0)
LYMPHS ABS: 0.6 10*3/uL — AB (ref 0.7–4.0)
LYMPHS PCT: 22 %
MCH: 29.8 pg (ref 26.0–34.0)
MCHC: 32.3 g/dL (ref 30.0–36.0)
MCV: 92.4 fL (ref 78.0–100.0)
MONOS PCT: 9 %
Monocytes Absolute: 0.2 10*3/uL (ref 0.1–1.0)
NEUTROS ABS: 1.7 10*3/uL (ref 1.7–7.7)
Neutrophils Relative %: 66 %
Platelets: 132 10*3/uL — ABNORMAL LOW (ref 150–400)
RBC: 3.42 MIL/uL — ABNORMAL LOW (ref 3.87–5.11)
RDW: 12.9 % (ref 11.5–15.5)
WBC: 2.6 10*3/uL — ABNORMAL LOW (ref 4.0–10.5)

## 2016-08-05 LAB — COMPREHENSIVE METABOLIC PANEL
ALBUMIN: 3.6 g/dL (ref 3.5–5.0)
ALK PHOS: 47 U/L (ref 38–126)
ALT: 11 U/L — ABNORMAL LOW (ref 14–54)
AST: 21 U/L (ref 15–41)
Anion gap: 4 — ABNORMAL LOW (ref 5–15)
BILIRUBIN TOTAL: 0.3 mg/dL (ref 0.3–1.2)
BUN: 22 mg/dL — AB (ref 6–20)
CALCIUM: 9.1 mg/dL (ref 8.9–10.3)
CO2: 28 mmol/L (ref 22–32)
Chloride: 105 mmol/L (ref 101–111)
Creatinine, Ser: 1.11 mg/dL — ABNORMAL HIGH (ref 0.44–1.00)
GFR calc Af Amer: 54 mL/min — ABNORMAL LOW (ref >60)
GFR calc non Af Amer: 46 mL/min — ABNORMAL LOW (ref >60)
GLUCOSE: 106 mg/dL — AB (ref 65–99)
Potassium: 4.1 mmol/L (ref 3.5–5.1)
Sodium: 137 mmol/L (ref 135–145)
TOTAL PROTEIN: 6.6 g/dL (ref 6.5–8.1)

## 2016-08-05 LAB — VITAMIN B12: Vitamin B-12: 741 pg/mL (ref 180–914)

## 2016-08-05 MED ORDER — DARBEPOETIN ALFA 40 MCG/0.4ML IJ SOSY
40.0000 ug | PREFILLED_SYRINGE | Freq: Once | INTRAMUSCULAR | Status: AC
  Administered 2016-08-05: 40 ug via SUBCUTANEOUS
  Filled 2016-08-05: qty 0.4

## 2016-08-05 MED ORDER — CYANOCOBALAMIN 1000 MCG/ML IJ SOLN
1000.0000 ug | Freq: Once | INTRAMUSCULAR | Status: AC
  Administered 2016-08-05: 1000 ug via INTRAMUSCULAR
  Filled 2016-08-05: qty 1

## 2016-08-05 NOTE — Patient Instructions (Signed)
[place] at [facility] Discharge Instructions  RECOMMENDATIONS MADE BY THE CONSULTANT AND ANY TEST RESULTS WILL BE SENT TO YOUR REFERRING PHYSICIAN.  Vitamin B12 and Aranesp injections given today. Keep appointments as scheduled.  Thank you for choosing [place] at [facility] to provide your oncology and hematology care.  To afford each patient quality time with our provider, please arrive at least 15 minutes before your scheduled appointment time.    If you have a lab appointment with the [facility] please come in thru the  Main Entrance and check in at the main information desk  You need to re-schedule your appointment should you arrive 10 or more minutes late.  We strive to give you quality time with our providers, and arriving late affects you and other patients whose appointments are after yours.  Also, if you no show three or more times for appointments you may be dismissed from the clinic at the providers discretion.     Again, thank you for choosing [facility].  Our hope is that these requests will decrease the amount of time that you wait before being seen by our physicians.       _____________________________________________________________  Should you have questions after your visit to [facility], please contact our office at [phone] between the hours of 8:30 a.m. and 4:30 p.m.  Voicemails left after 4:30 p.m. will not be returned until the following business day.  For prescription refill requests, have your pharmacy contact our office.       Resources For Cancer Patients and their Caregivers ? American Cancer Society: Can assist with transportation, wigs, general needs, runs Look Good Feel Better.        208 272 1631 ? Cancer Care: Provides financial assistance, online support groups, medication/co-pay assistance.  1-800-813-HOPE 9341732556) ? [facility] Assists  [place] cancer patients and their families through emotional , educational and financial support.  (601)531-2860 ? [place] DSS Where to apply for food stamps, Medicaid and utility assistance. (712)193-4936 ? RCATS: Transportation to medical appointments. 845-473-1619 ? Social Security Administration: May apply for disability if have a Stage IV cancer. 732-793-8330 (301) 280-6801 ? [employer], Disability and Transit Services: Assists with nutrition, care and transit needs. [place] Support Programs: '@10RELATIVEDAYS'$ @ > Cancer Support Group  2nd Tuesday of the month 1pm-2pm, Journey Room  > Creative Journey  3rd Tuesday of the month 1130am-1pm, Journey Room  > Look Good Feel Better  1st Wednesday of the month 10am-12 noon, Journey Room (Call [place] to register [phone])

## 2016-08-05 NOTE — Progress Notes (Signed)
Janet Fletcher presents today for injection per MD orders.  Vitamin B12  administered IM in left Upper Arm. Administration without incident. Patient tolerated well.  Janet Fletcher presents today for injection per MD orders. Aranesp  administered SQ in left sided abdominal tissue Administration without incident. Patient tolerated well.

## 2016-08-05 NOTE — Progress Notes (Signed)
Patient left ambulatory accompanied by family in stable condition.

## 2016-08-19 ENCOUNTER — Other Ambulatory Visit (HOSPITAL_COMMUNITY): Payer: Self-pay | Admitting: *Deleted

## 2016-08-19 DIAGNOSIS — C3491 Malignant neoplasm of unspecified part of right bronchus or lung: Secondary | ICD-10-CM

## 2016-08-20 ENCOUNTER — Encounter (HOSPITAL_COMMUNITY): Payer: Medicare HMO

## 2016-08-20 ENCOUNTER — Encounter (HOSPITAL_COMMUNITY): Payer: Self-pay

## 2016-08-20 ENCOUNTER — Encounter (HOSPITAL_BASED_OUTPATIENT_CLINIC_OR_DEPARTMENT_OTHER): Payer: Medicare HMO

## 2016-08-20 VITALS — BP 125/64 | HR 68 | Temp 97.8°F | Resp 16

## 2016-08-20 DIAGNOSIS — Z9221 Personal history of antineoplastic chemotherapy: Secondary | ICD-10-CM | POA: Diagnosis not present

## 2016-08-20 DIAGNOSIS — Z9889 Other specified postprocedural states: Secondary | ICD-10-CM | POA: Diagnosis not present

## 2016-08-20 DIAGNOSIS — Z923 Personal history of irradiation: Secondary | ICD-10-CM | POA: Diagnosis not present

## 2016-08-20 DIAGNOSIS — E538 Deficiency of other specified B group vitamins: Secondary | ICD-10-CM

## 2016-08-20 DIAGNOSIS — D649 Anemia, unspecified: Secondary | ICD-10-CM

## 2016-08-20 DIAGNOSIS — F17210 Nicotine dependence, cigarettes, uncomplicated: Secondary | ICD-10-CM | POA: Diagnosis not present

## 2016-08-20 DIAGNOSIS — D509 Iron deficiency anemia, unspecified: Secondary | ICD-10-CM | POA: Diagnosis not present

## 2016-08-20 DIAGNOSIS — Z8249 Family history of ischemic heart disease and other diseases of the circulatory system: Secondary | ICD-10-CM | POA: Diagnosis not present

## 2016-08-20 DIAGNOSIS — M8580 Other specified disorders of bone density and structure, unspecified site: Secondary | ICD-10-CM | POA: Diagnosis not present

## 2016-08-20 DIAGNOSIS — C3491 Malignant neoplasm of unspecified part of right bronchus or lung: Secondary | ICD-10-CM

## 2016-08-20 DIAGNOSIS — Z809 Family history of malignant neoplasm, unspecified: Secondary | ICD-10-CM | POA: Diagnosis not present

## 2016-08-20 LAB — COMPREHENSIVE METABOLIC PANEL
ALK PHOS: 48 U/L (ref 38–126)
ALT: 12 U/L — ABNORMAL LOW (ref 14–54)
ANION GAP: 5 (ref 5–15)
AST: 21 U/L (ref 15–41)
Albumin: 3.6 g/dL (ref 3.5–5.0)
BILIRUBIN TOTAL: 0.4 mg/dL (ref 0.3–1.2)
BUN: 26 mg/dL — ABNORMAL HIGH (ref 6–20)
CALCIUM: 9 mg/dL (ref 8.9–10.3)
CO2: 29 mmol/L (ref 22–32)
Chloride: 106 mmol/L (ref 101–111)
Creatinine, Ser: 1.26 mg/dL — ABNORMAL HIGH (ref 0.44–1.00)
GFR, EST AFRICAN AMERICAN: 46 mL/min — AB (ref >60)
GFR, EST NON AFRICAN AMERICAN: 40 mL/min — AB (ref >60)
Glucose, Bld: 106 mg/dL — ABNORMAL HIGH (ref 65–99)
Potassium: 4.1 mmol/L (ref 3.5–5.1)
Sodium: 140 mmol/L (ref 135–145)
TOTAL PROTEIN: 6.3 g/dL — AB (ref 6.5–8.1)

## 2016-08-20 LAB — CBC WITH DIFFERENTIAL/PLATELET
Basophils Absolute: 0 10*3/uL (ref 0.0–0.1)
Basophils Relative: 0 %
EOS ABS: 0.1 10*3/uL (ref 0.0–0.7)
Eosinophils Relative: 3 %
HEMATOCRIT: 30.2 % — AB (ref 36.0–46.0)
Hemoglobin: 9.9 g/dL — ABNORMAL LOW (ref 12.0–15.0)
LYMPHS ABS: 0.5 10*3/uL — AB (ref 0.7–4.0)
Lymphocytes Relative: 19 %
MCH: 29.7 pg (ref 26.0–34.0)
MCHC: 32.8 g/dL (ref 30.0–36.0)
MCV: 90.7 fL (ref 78.0–100.0)
MONOS PCT: 10 %
Monocytes Absolute: 0.3 10*3/uL (ref 0.1–1.0)
NEUTROS ABS: 1.8 10*3/uL (ref 1.7–7.7)
NEUTROS PCT: 68 %
Platelets: 130 10*3/uL — ABNORMAL LOW (ref 150–400)
RBC: 3.33 MIL/uL — ABNORMAL LOW (ref 3.87–5.11)
RDW: 13.3 % (ref 11.5–15.5)
WBC: 2.7 10*3/uL — ABNORMAL LOW (ref 4.0–10.5)

## 2016-08-20 MED ORDER — DARBEPOETIN ALFA 40 MCG/0.4ML IJ SOSY
40.0000 ug | PREFILLED_SYRINGE | Freq: Once | INTRAMUSCULAR | Status: AC
  Administered 2016-08-20: 40 ug via SUBCUTANEOUS

## 2016-08-20 MED ORDER — DARBEPOETIN ALFA 40 MCG/0.4ML IJ SOSY
PREFILLED_SYRINGE | INTRAMUSCULAR | Status: AC
  Filled 2016-08-20: qty 0.4

## 2016-08-20 NOTE — Patient Instructions (Signed)
[place] at [facility] Discharge Instructions  RECOMMENDATIONS MADE BY THE CONSULTANT AND ANY TEST RESULTS WILL BE SENT TO YOUR REFERRING PHYSICIAN.  Aranesp injection today. Take Tylenol as needed for your intermittent right arm pain, and follow-up with your primary care physician regarding this pain if it continues to be a problem.  Return for lab work and injections as scheduled.   Thank you for choosing [place] at [facility] to provide your oncology and hematology care.  To afford each patient quality time with our provider, please arrive at least 15 minutes before your scheduled appointment time.    If you have a lab appointment with the [facility] please come in thru the  Main Entrance and check in at the main information desk  You need to re-schedule your appointment should you arrive 10 or more minutes late.  We strive to give you quality time with our providers, and arriving late affects you and other patients whose appointments are after yours.  Also, if you no show three or more times for appointments you may be dismissed from the clinic at the providers discretion.     Again, thank you for choosing [facility].  Our hope is that these requests will decrease the amount of time that you wait before being seen by our physicians.       _____________________________________________________________  Should you have questions after your visit to [facility], please contact our office at [phone] between the hours of 8:30 a.m. and 4:30 p.m.  Voicemails left after 4:30 p.m. will not be returned until the following business day.  For prescription refill requests, have your pharmacy contact our office.       Resources For Cancer Patients and their Caregivers ? American Cancer Society: Can assist with transportation, wigs, general needs, runs Look Good Feel Better.        913-416-0092 ? Cancer  Care: Provides financial assistance, online support groups, medication/co-pay assistance.  1-800-813-HOPE (708)595-6355) ? [place] Assists [place] cancer patients and their families through emotional , educational and financial support.  (817)691-1259 ? [place] DSS Where to apply for food stamps, Medicaid and utility assistance. (401)863-2666 ? [employer]: Transportation to medical appointments. 704-194-9811 ? Social Security Administration: May apply for disability if have a Stage IV cancer. (512) 324-9390 (504)514-1081 ? [employer], Disability and Transit Services: Assists with nutrition, care and transit needs. [place] Support Programs: '@10RELATIVEDAYS'$ @ > Cancer Support Group  2nd Tuesday of the month 1pm-2pm, Journey Room  > Creative Journey  3rd Tuesday of the month 1130am-1pm, Journey Room  > Look Good Feel Better  1st Wednesday of the month 10am-12 noon, Journey Room (Call [place] to register [phone])

## 2016-08-20 NOTE — Progress Notes (Signed)
SEMAYA VIDA presents today for injection per the provider's orders.  Aranesp administration without incident; see MAR for injection details.  Patient tolerated procedure well and without incident.  No questions or complaints noted at this time.

## 2016-08-21 ENCOUNTER — Encounter (HOSPITAL_COMMUNITY): Payer: Self-pay | Admitting: Hematology & Oncology

## 2016-08-27 ENCOUNTER — Other Ambulatory Visit: Payer: Self-pay | Admitting: Nurse Practitioner

## 2016-08-28 NOTE — Telephone Encounter (Signed)
canot fill simvastatin- NTBS for labs

## 2016-09-05 ENCOUNTER — Encounter (HOSPITAL_COMMUNITY): Payer: Medicare HMO

## 2016-09-05 ENCOUNTER — Encounter (HOSPITAL_COMMUNITY): Payer: Medicare HMO | Attending: Hematology & Oncology

## 2016-09-05 ENCOUNTER — Encounter (HOSPITAL_COMMUNITY): Payer: Self-pay

## 2016-09-05 VITALS — BP 119/58 | HR 75 | Temp 97.6°F | Resp 16

## 2016-09-05 DIAGNOSIS — C3491 Malignant neoplasm of unspecified part of right bronchus or lung: Secondary | ICD-10-CM

## 2016-09-05 DIAGNOSIS — Z9221 Personal history of antineoplastic chemotherapy: Secondary | ICD-10-CM | POA: Diagnosis not present

## 2016-09-05 DIAGNOSIS — Z809 Family history of malignant neoplasm, unspecified: Secondary | ICD-10-CM | POA: Insufficient documentation

## 2016-09-05 DIAGNOSIS — M8580 Other specified disorders of bone density and structure, unspecified site: Secondary | ICD-10-CM | POA: Insufficient documentation

## 2016-09-05 DIAGNOSIS — D509 Iron deficiency anemia, unspecified: Secondary | ICD-10-CM | POA: Insufficient documentation

## 2016-09-05 DIAGNOSIS — Z923 Personal history of irradiation: Secondary | ICD-10-CM | POA: Diagnosis not present

## 2016-09-05 DIAGNOSIS — E538 Deficiency of other specified B group vitamins: Secondary | ICD-10-CM

## 2016-09-05 DIAGNOSIS — F17210 Nicotine dependence, cigarettes, uncomplicated: Secondary | ICD-10-CM | POA: Insufficient documentation

## 2016-09-05 DIAGNOSIS — D649 Anemia, unspecified: Secondary | ICD-10-CM

## 2016-09-05 DIAGNOSIS — Z8249 Family history of ischemic heart disease and other diseases of the circulatory system: Secondary | ICD-10-CM | POA: Insufficient documentation

## 2016-09-05 DIAGNOSIS — Z9889 Other specified postprocedural states: Secondary | ICD-10-CM | POA: Diagnosis not present

## 2016-09-05 LAB — CBC WITH DIFFERENTIAL/PLATELET
BASOS PCT: 0 %
Basophils Absolute: 0 10*3/uL (ref 0.0–0.1)
EOS ABS: 0.1 10*3/uL (ref 0.0–0.7)
EOS PCT: 2 %
HCT: 29.7 % — ABNORMAL LOW (ref 36.0–46.0)
Hemoglobin: 9.7 g/dL — ABNORMAL LOW (ref 12.0–15.0)
LYMPHS ABS: 0.8 10*3/uL (ref 0.7–4.0)
Lymphocytes Relative: 22 %
MCH: 29.7 pg (ref 26.0–34.0)
MCHC: 32.7 g/dL (ref 30.0–36.0)
MCV: 90.8 fL (ref 78.0–100.0)
Monocytes Absolute: 0.3 10*3/uL (ref 0.1–1.0)
Monocytes Relative: 7 %
Neutro Abs: 2.6 10*3/uL (ref 1.7–7.7)
Neutrophils Relative %: 69 %
PLATELETS: 130 10*3/uL — AB (ref 150–400)
RBC: 3.27 MIL/uL — AB (ref 3.87–5.11)
RDW: 13.8 % (ref 11.5–15.5)
WBC: 3.8 10*3/uL — AB (ref 4.0–10.5)

## 2016-09-05 LAB — COMPREHENSIVE METABOLIC PANEL
ALT: 13 U/L — ABNORMAL LOW (ref 14–54)
ANION GAP: 8 (ref 5–15)
AST: 24 U/L (ref 15–41)
Albumin: 3.7 g/dL (ref 3.5–5.0)
Alkaline Phosphatase: 52 U/L (ref 38–126)
BUN: 32 mg/dL — ABNORMAL HIGH (ref 6–20)
CHLORIDE: 104 mmol/L (ref 101–111)
CO2: 28 mmol/L (ref 22–32)
Calcium: 9.2 mg/dL (ref 8.9–10.3)
Creatinine, Ser: 1.36 mg/dL — ABNORMAL HIGH (ref 0.44–1.00)
GFR calc non Af Amer: 36 mL/min — ABNORMAL LOW (ref >60)
GFR, EST AFRICAN AMERICAN: 42 mL/min — AB (ref >60)
Glucose, Bld: 99 mg/dL (ref 65–99)
Potassium: 4.3 mmol/L (ref 3.5–5.1)
SODIUM: 140 mmol/L (ref 135–145)
Total Bilirubin: 0.5 mg/dL (ref 0.3–1.2)
Total Protein: 6.6 g/dL (ref 6.5–8.1)

## 2016-09-05 MED ORDER — DARBEPOETIN ALFA 40 MCG/0.4ML IJ SOSY
40.0000 ug | PREFILLED_SYRINGE | Freq: Once | INTRAMUSCULAR | Status: AC
  Administered 2016-09-05: 40 ug via SUBCUTANEOUS

## 2016-09-05 MED ORDER — DARBEPOETIN ALFA 40 MCG/0.4ML IJ SOSY
PREFILLED_SYRINGE | INTRAMUSCULAR | Status: AC
  Filled 2016-09-05: qty 0.4

## 2016-09-05 MED ORDER — CYANOCOBALAMIN 1000 MCG/ML IJ SOLN
INTRAMUSCULAR | Status: AC
  Filled 2016-09-05: qty 1

## 2016-09-05 MED ORDER — CYANOCOBALAMIN 1000 MCG/ML IJ SOLN
1000.0000 ug | Freq: Once | INTRAMUSCULAR | Status: AC
  Administered 2016-09-05: 1000 ug via INTRAMUSCULAR

## 2016-09-05 NOTE — Progress Notes (Signed)
Janet Fletcher presents today for injection per the provider's orders.  B12 and Aranesp administrations without incident; see MAR for injection details.  Patient tolerated procedure well and without incident.  No questions or complaints noted at this time.

## 2016-09-05 NOTE — Patient Instructions (Signed)
[place] at [facility] Discharge Instructions  RECOMMENDATIONS MADE BY THE CONSULTANT AND ANY TEST RESULTS WILL BE SENT TO YOUR REFERRING PHYSICIAN.  B12 and Aranesp injections today. Return as scheduled.  Thank you for choosing [place] at [facility] to provide your oncology and hematology care.  To afford each patient quality time with our provider, please arrive at least 15 minutes before your scheduled appointment time.    If you have a lab appointment with the [facility] please come in thru the  Main Entrance and check in at the main information desk  You need to re-schedule your appointment should you arrive 10 or more minutes late.  We strive to give you quality time with our providers, and arriving late affects you and other patients whose appointments are after yours.  Also, if you no show three or more times for appointments you may be dismissed from the clinic at the providers discretion.     Again, thank you for choosing [facility].  Our hope is that these requests will decrease the amount of time that you wait before being seen by our physicians.       _____________________________________________________________  Should you have questions after your visit to [facility], please contact our office at [phone] between the hours of 8:30 a.m. and 4:30 p.m.  Voicemails left after 4:30 p.m. will not be returned until the following business day.  For prescription refill requests, have your pharmacy contact our office.       Resources For Cancer Patients and their Caregivers ? American Cancer Society: Can assist with transportation, wigs, general needs, runs Look Good Feel Better.        507-031-5835 ? Cancer Care: Provides financial assistance, online support groups, medication/co-pay assistance.  1-800-813-HOPE (914) 657-4858) ? [place] Assists [place] cancer  patients and their families through emotional , educational and financial support.  787-622-5112 ? [place] DSS Where to apply for food stamps, Medicaid and utility assistance. 450-362-4076 ? [employer]: Transportation to medical appointments. 236-707-6460 ? Social Security Administration: May apply for disability if have a Stage IV cancer. 914 118 6912 224-167-7114 ? [employer], Disability and Transit Services: Assists with nutrition, care and transit needs. [place] Support Programs: '@10RELATIVEDAYS'$ @ > Cancer Support Group  2nd Tuesday of the month 1pm-2pm, Journey Room  > Creative Journey  3rd Tuesday of the month 1130am-1pm, Journey Room  > Look Good Feel Better  1st Wednesday of the month 10am-12 noon, Journey Room (Call [place] to register [phone])

## 2016-09-09 ENCOUNTER — Other Ambulatory Visit: Payer: Self-pay | Admitting: *Deleted

## 2016-09-09 ENCOUNTER — Encounter: Payer: Self-pay | Admitting: Nurse Practitioner

## 2016-09-09 ENCOUNTER — Ambulatory Visit (INDEPENDENT_AMBULATORY_CARE_PROVIDER_SITE_OTHER): Payer: Medicare HMO | Admitting: Nurse Practitioner

## 2016-09-09 VITALS — BP 110/61 | HR 80 | Temp 96.7°F | Ht 63.0 in | Wt 122.0 lb

## 2016-09-09 DIAGNOSIS — M79601 Pain in right arm: Secondary | ICD-10-CM | POA: Diagnosis not present

## 2016-09-09 MED ORDER — SIMVASTATIN 40 MG PO TABS: ORAL_TABLET | ORAL | 0 refills | Status: DC

## 2016-09-09 MED ORDER — PREDNISONE 10 MG (21) PO TBPK: ORAL_TABLET | ORAL | 0 refills | Status: DC

## 2016-09-09 NOTE — Progress Notes (Signed)
Call from [facility] stating pt is there now to pick up RX Simvastatin sent in [facility] per MMM

## 2016-09-09 NOTE — Progress Notes (Signed)
   Subjective:    Patient ID: Janet Fletcher, female    DOB: Dec 10, 1935, 81 y.o.   MRN: [id number]  HPI  Patient comes in today , escorted by her daughter. SHeis c/o right upper arm pain that started about 3 months ago- pain is worse with movement and rates as high as 8/10 on pain scale. She denies any injury. Denies any shoulder pain. SHe has been taking tylenol which helps occasionally.   Review of Systems  Constitutional: Negative.   HENT: Negative.   Respiratory: Negative.   Cardiovascular: Negative.   Genitourinary: Negative.   Musculoskeletal: Positive for myalgias (left upper arm).  Neurological: Negative.   Psychiatric/Behavioral: Negative.   All other systems reviewed and are negative.      Objective:   Physical Exam  Constitutional: She is oriented to person, place, and time. She appears well-developed and well-nourished. No distress.  Cardiovascular: Normal rate and regular rhythm.   Pulmonary/Chest: Effort normal and breath sounds normal.  Musculoskeletal:  Pain on palpation middle portion of upper arm. No palpable mas- no edema FROM of shoulder without pain in shoulder joint- has pain on abduction of right arm in area of concern. Grips equal bil  Neurological: She is alert and oriented to person, place, and time. She has normal reflexes.  Skin: Skin is warm.  Psychiatric: She has a normal mood and affect. Her behavior is normal. Judgment and thought content normal.   BP 110/61   Pulse 80   Temp (!) 96.7 F (35.9 C) (Oral)   Ht '5\' 3"'$  (1.6 m)   Wt 122 lb (55.3 kg)   BMI 21.61 kg/m      Assessment & Plan:  1. Arm pain, musculoskeletal, right Moist heat to area BID Stretching exerises If no improvement after steroids will send to ortho - predniSONE (STERAPRED UNI-PAK 21 TAB) 10 MG (21) TBPK tablet; As directed x 6 days  Dispense: 21 tablet; Refill: 0  Mary-Margaret Hassell Done, FNP

## 2016-09-11 DIAGNOSIS — Z08 Encounter for follow-up examination after completed treatment for malignant neoplasm: Secondary | ICD-10-CM | POA: Diagnosis not present

## 2016-09-11 DIAGNOSIS — C3411 Malignant neoplasm of upper lobe, right bronchus or lung: Secondary | ICD-10-CM | POA: Diagnosis not present

## 2016-09-11 DIAGNOSIS — Z85118 Personal history of other malignant neoplasm of bronchus and lung: Secondary | ICD-10-CM | POA: Diagnosis not present

## 2016-09-11 DIAGNOSIS — Z923 Personal history of irradiation: Secondary | ICD-10-CM | POA: Diagnosis not present

## 2016-09-19 ENCOUNTER — Telehealth (HOSPITAL_COMMUNITY): Payer: Self-pay

## 2016-09-19 ENCOUNTER — Ambulatory Visit (HOSPITAL_COMMUNITY): Payer: Commercial Managed Care - HMO

## 2016-09-19 ENCOUNTER — Other Ambulatory Visit (HOSPITAL_COMMUNITY): Payer: Commercial Managed Care - HMO

## 2016-09-19 NOTE — Telephone Encounter (Signed)
Patient called r/t her missed appointment today for Aranesp and lab.  She was rescheduled to return for both next week to coordinate with other appointments.  Patient called and informed, and verbalized understanding.

## 2016-09-23 ENCOUNTER — Encounter (HOSPITAL_COMMUNITY): Payer: Medicare HMO | Attending: Oncology

## 2016-09-23 ENCOUNTER — Ambulatory Visit (HOSPITAL_COMMUNITY)
Admission: RE | Admit: 2016-09-23 | Discharge: 2016-09-23 | Disposition: A | Discharge status: Home / Self Care | Payer: Medicare HMO | Source: Ambulatory Visit | Attending: Hematology & Oncology | Admitting: Hematology & Oncology

## 2016-09-23 DIAGNOSIS — Z9221 Personal history of antineoplastic chemotherapy: Secondary | ICD-10-CM | POA: Insufficient documentation

## 2016-09-23 DIAGNOSIS — Z5189 Encounter for other specified aftercare: Secondary | ICD-10-CM | POA: Insufficient documentation

## 2016-09-23 DIAGNOSIS — F17210 Nicotine dependence, cigarettes, uncomplicated: Secondary | ICD-10-CM | POA: Insufficient documentation

## 2016-09-23 DIAGNOSIS — D649 Anemia, unspecified: Secondary | ICD-10-CM | POA: Diagnosis not present

## 2016-09-23 DIAGNOSIS — R5383 Other fatigue: Secondary | ICD-10-CM | POA: Insufficient documentation

## 2016-09-23 DIAGNOSIS — E785 Hyperlipidemia, unspecified: Secondary | ICD-10-CM | POA: Insufficient documentation

## 2016-09-23 DIAGNOSIS — Z888 Allergy status to other drugs, medicaments and biological substances status: Secondary | ICD-10-CM | POA: Diagnosis not present

## 2016-09-23 DIAGNOSIS — Z8249 Family history of ischemic heart disease and other diseases of the circulatory system: Secondary | ICD-10-CM | POA: Diagnosis not present

## 2016-09-23 DIAGNOSIS — C3491 Malignant neoplasm of unspecified part of right bronchus or lung: Secondary | ICD-10-CM

## 2016-09-23 DIAGNOSIS — Z79899 Other long term (current) drug therapy: Secondary | ICD-10-CM | POA: Diagnosis not present

## 2016-09-23 DIAGNOSIS — Z9889 Other specified postprocedural states: Secondary | ICD-10-CM | POA: Diagnosis not present

## 2016-09-23 DIAGNOSIS — Z809 Family history of malignant neoplasm, unspecified: Secondary | ICD-10-CM | POA: Diagnosis not present

## 2016-09-23 DIAGNOSIS — Y842 Radiological procedure and radiotherapy as the cause of abnormal reaction of the patient, or of later complication, without mention of misadventure at the time of the procedure: Secondary | ICD-10-CM | POA: Insufficient documentation

## 2016-09-23 DIAGNOSIS — Z923 Personal history of irradiation: Secondary | ICD-10-CM | POA: Diagnosis not present

## 2016-09-23 DIAGNOSIS — Z880 Allergy status to penicillin: Secondary | ICD-10-CM | POA: Insufficient documentation

## 2016-09-23 LAB — CBC WITH DIFFERENTIAL/PLATELET
Basophils Absolute: 0 10*3/uL (ref 0.0–0.1)
Basophils Relative: 0 %
Eosinophils Absolute: 0 10*3/uL (ref 0.0–0.7)
Eosinophils Relative: 1 %
HEMATOCRIT: 29.4 % — AB (ref 36.0–46.0)
HEMOGLOBIN: 9.5 g/dL — AB (ref 12.0–15.0)
LYMPHS ABS: 0.7 10*3/uL (ref 0.7–4.0)
LYMPHS PCT: 17 %
MCH: 28.9 pg (ref 26.0–34.0)
MCHC: 32.3 g/dL (ref 30.0–36.0)
MCV: 89.4 fL (ref 78.0–100.0)
MONO ABS: 0.3 10*3/uL (ref 0.1–1.0)
MONOS PCT: 7 %
NEUTROS ABS: 2.8 10*3/uL (ref 1.7–7.7)
NEUTROS PCT: 75 %
Platelets: 107 10*3/uL — ABNORMAL LOW (ref 150–400)
RBC: 3.29 MIL/uL — ABNORMAL LOW (ref 3.87–5.11)
RDW: 14.4 % (ref 11.5–15.5)
WBC: 3.8 10*3/uL — ABNORMAL LOW (ref 4.0–10.5)

## 2016-09-23 LAB — COMPREHENSIVE METABOLIC PANEL
ALBUMIN: 3.4 g/dL — AB (ref 3.5–5.0)
ALK PHOS: 50 U/L (ref 38–126)
ALT: 16 U/L (ref 14–54)
AST: 24 U/L (ref 15–41)
Anion gap: 7 (ref 5–15)
BILIRUBIN TOTAL: 0.4 mg/dL (ref 0.3–1.2)
BUN: 29 mg/dL — ABNORMAL HIGH (ref 6–20)
CHLORIDE: 107 mmol/L (ref 101–111)
CO2: 28 mmol/L (ref 22–32)
Calcium: 9 mg/dL (ref 8.9–10.3)
Creatinine, Ser: 1.3 mg/dL — ABNORMAL HIGH (ref 0.44–1.00)
GFR calc Af Amer: 44 mL/min — ABNORMAL LOW (ref >60)
GFR calc non Af Amer: 38 mL/min — ABNORMAL LOW (ref >60)
Glucose, Bld: 120 mg/dL — ABNORMAL HIGH (ref 65–99)
Potassium: 4 mmol/L (ref 3.5–5.1)
Sodium: 142 mmol/L (ref 135–145)
Total Protein: 6.1 g/dL — ABNORMAL LOW (ref 6.5–8.1)

## 2016-09-23 MED ORDER — IOPAMIDOL (ISOVUE-300) INJECTION 61%
60.0000 mL | Freq: Once | INTRAVENOUS | Status: AC | PRN
  Administered 2016-09-23: 60 mL via INTRAVENOUS

## 2016-09-24 ENCOUNTER — Encounter (HOSPITAL_COMMUNITY): Payer: Self-pay | Admitting: Adult Health

## 2016-09-24 ENCOUNTER — Encounter (HOSPITAL_BASED_OUTPATIENT_CLINIC_OR_DEPARTMENT_OTHER): Payer: Medicare HMO

## 2016-09-24 ENCOUNTER — Encounter (HOSPITAL_BASED_OUTPATIENT_CLINIC_OR_DEPARTMENT_OTHER): Payer: Medicare HMO | Admitting: Adult Health

## 2016-09-24 VITALS — BP 114/59 | HR 81 | Temp 97.5°F | Resp 16 | Ht 64.0 in | Wt 121.0 lb

## 2016-09-24 DIAGNOSIS — R69 Illness, unspecified: Secondary | ICD-10-CM | POA: Diagnosis not present

## 2016-09-24 DIAGNOSIS — E538 Deficiency of other specified B group vitamins: Secondary | ICD-10-CM

## 2016-09-24 DIAGNOSIS — D649 Anemia, unspecified: Secondary | ICD-10-CM

## 2016-09-24 DIAGNOSIS — C3491 Malignant neoplasm of unspecified part of right bronchus or lung: Secondary | ICD-10-CM

## 2016-09-24 DIAGNOSIS — R5383 Other fatigue: Secondary | ICD-10-CM

## 2016-09-24 DIAGNOSIS — G3184 Mild cognitive impairment, so stated: Secondary | ICD-10-CM | POA: Diagnosis not present

## 2016-09-24 DIAGNOSIS — Z87891 Personal history of nicotine dependence: Secondary | ICD-10-CM | POA: Diagnosis not present

## 2016-09-24 DIAGNOSIS — C3411 Malignant neoplasm of upper lobe, right bronchus or lung: Secondary | ICD-10-CM | POA: Diagnosis not present

## 2016-09-24 MED ORDER — DARBEPOETIN ALFA 60 MCG/0.3ML IJ SOSY
PREFILLED_SYRINGE | INTRAMUSCULAR | Status: AC
  Filled 2016-09-24: qty 0.3

## 2016-09-24 MED ORDER — DARBEPOETIN ALFA 60 MCG/0.3ML IJ SOSY
60.0000 ug | PREFILLED_SYRINGE | Freq: Once | INTRAMUSCULAR | Status: AC
  Administered 2016-09-24: 60 ug via SUBCUTANEOUS

## 2016-09-24 NOTE — Progress Notes (Addendum)
Janet Fletcher, [place] [id number]   CLINIC:  Medical Oncology/Hematology  PCP:  Chevis Pretty, [place] [address] [phone]   REASON FOR VISIT:  Follow-up for limited stage small cell lung cancer AND Anemia   CURRENT THERAPY: Surveillance via NCCN Guidelines AND Aranesp injections every 2 weeks & B12 injections monthly    BRIEF ONCOLOGIC HISTORY:    Small cell carcinoma of right lung ([place])   11/28/2015 Imaging    Large anterior mediastinal mass in continuity with a R hilar mass, narrowing of SVC, abnormal densities in RUL, 9 mm spiculated nodule, nonspec. hypodensity in liver      12/05/2015 PET scan    Large hypermetabolic paratracheal mass c/w SCLC, perihilar nodular densities in RML, mild metabolic activity RUL nodule, no distant metastatic disease      12/11/2015 Procedure    Video bronch with biopsies and brushings, endobronchial ultrasound with mediastinal LN aspiration. Dr. Roxan Hockey      12/11/2015 Pathology Results    Trachea biopsy negative, FNA RUL malignant cells c/w SCLC      12/13/2015 - 03/15/2016 Chemotherapy    Cisplatin/Etoposide x 5 cycles.  Cycle #6 cancelled due to significant cytopenias      12/25/2015 Imaging    MRI brain- No intracranial parenchymal enhancing lesion. Tiny right frontal calvarial enhancing lesion. Small metastatic lesion not excluded although this may represent an incidentally detected benign process.      12/29/2015 - 02/13/2016 Radiation Therapy    XRT      02/14/2016 Treatment Plan Change    Chemotherapy deferred x 1 week      02/21/2016 Treatment Plan Change    Carboplatin dose reduced by 20% and Etoposide dose reduced by 10%.      04/24/2016 Imaging    CT CAP- Chest Impression:  1. Marked reduction in mediastinal and RIGHT hilar lymphadenopathy with no residual pathologically enlarged nodes. 2. Resolution of perihilar nodularity in the RIGHT lung which  was hypermetabolic on FDG PET. 3. Persistent nodule in the RIGHT upper lobe which is not hypermetabolic. 4. New ground-glass nodule in the RIGHT upper lobe is nonspecific and may relate to therapy.  Abdomen / Pelvis Impression:  1. No evidence of metastatic disease in the abdomen pelvis. 2. Atherosclerotic calcification of the aorta.      04/24/2016 Imaging    Bone scan- Nonspecific increased tracer localization at approximately T11 vertebral body; this is of uncertain etiology, could represent a degenerative process though metastatic disease is not excluded.      04/29/2016 -  Radiation Therapy    PCI -- 25 Gy      07/24/2016 Imaging    CT CAP- 1. There has been interval decrease in size of mediastinal lymph nodes. The index lesion within the right upper lobe is also decreased in size from previous exam. 2. New subpleural nodule is noted within the posteromedial right lower lobe. Attention on follow-up imaging advised. 3. Stable ground-glass attenuating nodule within the right lower lobe and anteromedial left upper lobe 4. Aortic atherosclerosis and coronary artery calcification      07/29/2016 Imaging    MRI t-spine: 1. Stable benign slightly enhancing sclerotic lesion in the right posterolateral aspect of the T11 vertebral body, unchanged for 5 years. This is felt to account for the subtle increased activity at T11 on the SPECT bone scan. 2. Slight degenerative disc disease with small disc protrusions at T1-2 and T2-3 and T12-L1 with  no neural impingement. 3. No evidence of metastatic disease.      09/23/2016 Imaging    CT chest- Radiation changes in the central right upper lobe/ perihilar region.  Stable small mediastinal lymph nodes, unchanged.  No findings suspicious for new/progressive metastatic disease.        INTERVAL HISTORY:  Janet Fletcher 81 y.o. female returns to cancer center for routine follow-up for limited stage SCLC and anemia.  She is here today with  her daughter.   Her appetite and energy levels are slowly improving. Her daughter mentions that "she sometimes seems weak and sluggish."  She continues to supplement her diet with Ensure; her weight remains stable.  Daughter reports concerns regarding short-term memory loss; "she repeats what she says a lot to me and my siblings." Janet Fletcher's children do not allow her to drive because they are concerned for her safety given her forgetfulness and fatigue.   Denies any frank bleeding episodes. Denies blood in her stools/melena or hematuria.  She remains active with her children. 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    Otherwise, she is largely without complaints today.  She had restaging CT chest recently and is here to review those results.    REVIEW OF SYSTEMS:  Review of Systems  Constitutional: Positive for appetite change (improving ) and fatigue (improving ). Negative for chills and fever.  HENT:  Negative.   Eyes: Negative.  Negative for eye problems.  Respiratory: Negative.  Negative for cough and shortness of breath.   Cardiovascular: Negative.  Negative for chest pain and leg swelling.  Gastrointestinal: Negative.  Negative for abdominal pain, blood in stool, constipation, diarrhea, nausea and vomiting.  Endocrine: Negative.   Genitourinary: Negative.  Negative for dysuria, hematuria and vaginal bleeding.   Musculoskeletal: Negative.   Skin: Negative.   Neurological: Negative.  Negative for dizziness, headaches and light-headedness.  Hematological: Negative.   Psychiatric/Behavioral: Negative.  Negative for depression and sleep disturbance. The patient is not nervous/anxious.      PAST MEDICAL/SURGICAL HISTORY:  Past Medical History:  Diagnosis Date  . Allergy   . Cancer ([place])    lung  . Hyperlipidemia   . Iron deficiency anemia 12/15/2015  . Low vitamin B12 level 12/15/2015  . Osteopenia    Past Surgical  History:  Procedure Laterality Date  . CATARACT EXTRACTION, BILATERAL Bilateral   . MINOR EXCISION EAR CANAL CYST Left    30+ years ago  . SMALL INTESTINE SURGERY    . VIDEO BRONCHOSCOPY WITH ENDOBRONCHIAL ULTRASOUND N/A 12/11/2015   Procedure: VIDEO BRONCHOSCOPY WITH ENDOBRONCHIAL ULTRASOUND;  Surgeon: Melrose Nakayama, MD;  Location: [place];  Service: Thoracic;  Laterality: N/A;     SOCIAL HISTORY:  Social History   Social History  . Marital status: Married    Spouse name: N/A  . Number of children: N/A  . Years of education: N/A   Occupational History  . Not on file.   Social History Main Topics  . Smoking status: Current Every Day Smoker    Packs/day: 0.25    Years: 50.00  . Smokeless tobacco: Never Used  . Alcohol use No  . Drug use: No  . Sexual activity: Not on file   Other Topics Concern  . Not on file   Social History Narrative  . No narrative on file    FAMILY HISTORY:  Family History  Problem Relation Age of Onset  . Cancer Mother   .  Hypertension Father     CURRENT MEDICATIONS:  Outpatient Encounter Prescriptions as of 09/24/2016  Medication Sig Note  . lidocaine-prilocaine (EMLA) cream Apply a quarter size amount to port site 1 hour prior to chemo. Do not rub in. Cover with plastic wrap.   . simvastatin (ZOCOR) 40 MG tablet TAKE 1 TABLET (40 MG TOTAL) BY MOUTH AT BEDTIME.   Mar[place] Kitchen ondansetron (ZOFRAN) 8 MG tablet TAKE 1 TABLET (8 MG TOTAL) BY MOUTH EVERY 8 (EIGHT) HOURS AS NEEDED FOR NAUSEA OR VOMITING. (Patient not taking: Reported on 09/24/2016)   . prochlorperazine (COMPAZINE) 10 MG tablet Take 1 tablet (10 mg total) by mouth every 6 (six) hours as needed for nausea or vomiting. (Patient not taking: Reported on 09/24/2016)   . [DISCONTINUED] granisetron (SANCUSO) 3.1 MG/24HR Apply to skin starting 24 hours before chemotherapy. Remove after 7 days.   . [DISCONTINUED] KLOR-CON M20 20 MEQ tablet TAKE 1 TABLET (20 MEQ TOTAL) BY MOUTH 2 (TWO) TIMES DAILY.   .  [DISCONTINUED] MAG64 535 (64 Mg) MG TBCR TAKE 1 TABLET (64 MG TOTAL) BY MOUTH 3 (THREE) TIMES DAILY. 04/03/2016: Received from: External Pharmacy  . [DISCONTINUED] magnesium chloride (SLOW-MAG) 64 MG TBEC SR tablet Take 1 tablet (64 mg total) by mouth 3 (three) times daily.   . [DISCONTINUED] omeprazole (PRILOSEC) 40 MG capsule TAKE 1 CAPSULE (40 MG TOTAL) BY MOUTH DAILY.   . [DISCONTINUED] Polysacchar Iron-FA-B12 (FERREX 150 FORTE) 150-1-25 MG-MG-MCG CAPS Take 1 capsule by mouth daily.   . [DISCONTINUED] predniSONE (STERAPRED UNI-PAK 21 TAB) 10 MG (21) TBPK tablet As directed x 6 days    No facility-administered encounter medications on file as of 09/24/2016.     ALLERGIES:  Allergies  Allergen Reactions  . Acetaminophen Other (See Comments)    HEART RACES AWAY/ EXTRA STRENGTH TYLENOL  . Flagyl [Metronidazole Hcl] Other (See Comments)  . Penicillins Other (See Comments)     PHYSICAL EXAM:  ECOG Performance status: 1-2 - Symptomatic; requires occasional assistance.   Vitals:   09/24/16 1312  BP: (!) 114/59  Pulse: 81  Resp: 16  Temp: 97.5 F (36.4 C)   Filed Weights   09/24/16 1312  Weight: 121 lb (54.9 kg)    Physical Exam  Constitutional: She is oriented to person, place, and time and well-developed, well-nourished, and in no distress.  HENT:  Head: Normocephalic.  Mouth/Throat: Oropharynx is clear and moist. No oropharyngeal exudate.  Eyes: Conjunctivae are normal. Pupils are equal, round, and reactive to light. No scleral icterus.  Neck: Normal range of motion. Neck supple.  Cardiovascular: Normal rate, regular rhythm and normal heart sounds.   Pulmonary/Chest: Effort normal and breath sounds normal. No respiratory distress.  Abdominal: Soft. Bowel sounds are normal. There is no tenderness.  Musculoskeletal: Normal range of motion. She exhibits no edema.  Lymphadenopathy:    She has no cervical adenopathy.       Right: No supraclavicular adenopathy present.        Left: No supraclavicular adenopathy present.  Neurological: She is alert and oriented to person, place, and time. No cranial nerve deficit. Gait normal.  Skin: Skin is warm and dry. No rash noted. There is pallor (mildly pale mucous membranes ).  Psychiatric: Mood, memory, affect and judgment normal.  Nursing note and vitals reviewed.    LABORATORY DATA:  I have reviewed the labs as listed.  CBC    Component Value Date/Time   WBC 3.8 (L) 09/23/2016 0940   RBC 3.29 (L) 09/23/2016 0940  HGB 9.5 (L) 09/23/2016 0940   HCT 29.4 (L) 09/23/2016 0940   PLT 107 (L) 09/23/2016 0940   MCV 89.4 09/23/2016 0940   MCV 71.9 (A) 08/20/2013 1559   MCH 28.9 09/23/2016 0940   MCHC 32.3 09/23/2016 0940   RDW 14.4 09/23/2016 0940   LYMPHSABS 0.7 09/23/2016 0940   MONOABS 0.3 09/23/2016 0940   EOSABS 0.0 09/23/2016 0940   BASOSABS 0.0 09/23/2016 0940   CMP Latest Ref Rng & Units 09/23/2016 09/05/2016 08/20/2016  Glucose 65 - 99 mg/dL 120(H) 99 106(H)  BUN 6 - 20 mg/dL 29(H) 32(H) 26(H)  Creatinine 0.44 - 1.00 mg/dL 1.30(H) 1.36(H) 1.26(H)  Sodium 135 - 145 mmol/L 142 140 140  Potassium 3.5 - 5.1 mmol/L 4.0 4.3 4.1  Chloride 101 - 111 mmol/L 107 104 106  CO2 22 - 32 mmol/L '28 28 29  '$ Calcium 8.9 - 10.3 mg/dL 9.0 9.2 9.0  Total Protein 6.5 - 8.1 g/dL 6.1(L) 6.6 6.3(L)  Total Bilirubin 0.3 - 1.2 mg/dL 0.4 0.5 0.4  Alkaline Phos 38 - 126 U/L 50 52 48  AST 15 - 41 U/L '24 24 21  '$ ALT 14 - 54 U/L 16 13(L) 12(L)    PENDING LABS:    DIAGNOSTIC IMAGING:  Most recent CT chest: 09/23/16     PATHOLOGY:  FNA path: 12/11/15     ASSESSMENT & PLAN:   Limited stage small cell lung cancer:  -CT chest completed 09/23/16 stable with no findings suggestive of recurrence or metastatic disease. I reviewed the results with the patient and provided a copy of the report to her today.  -Clinically, she is continuing to recover from definitive therapy and her appetite and energy levels are improving.   -We  discussed that the highest risk of recurrence is within the first few years after completion of treatment, which is why we will follow her closely with H&P and scans.  She agreed with this plan.  -Return to cancer center in 3 months with CT chest/abd and follow-up visit according to NCCN Guidelines.        Mild cognitive deficit:  -Family reports concerns re: patient's short-term memory. This is likely secondary to prophylactic cranial irradiation.  Patient denies any headaches, changes in vision, falls, or nausea & vomiting. We will continue to monitor, but this is likely the patient's "new normal" s/p curative intent treatment for small cell lung cancer.   Anemia:  -Hemoglobin 9.5 today. Clinically, her fatigue is improving.   -Aranesp dose increased to 80 mcg. Treatment plan updated. -Continue monthly B12 injections.  -Return monthly for labs and consideration of subsequent Aranesp injections.   Fatigue:  -Could be secondary to anemia, as well as continued recovery from cancer treatments.  -Her weight is stable. Encouraged her to continue to increase her caloric and protein intake as she recovers.   -She remains active; encouraged her to maintain her activities, but be mindful of energy conservation as well.   Tobacco use:  -She continues to remain tobacco free. She understands the importance of avoiding tobacco products as it relates to her risk of recurrence for lung cancer.  She stopped smoking in 11/2015 and is committed to remaining tobacco-free.  I commended her continued efforts.     Dispo:  -Labs every 2 weeks with Aranesp injections.  -B12 injections monthly.  -Return to cancer center in 3 months after surveillance CT chest/abd scans for follow-up visit.    All questions were answered to patient's stated satisfaction. Encouraged patient  to call with any new concerns or questions before her next visit to the cancer center and we can certain see her sooner, if needed.       Orders placed this encounter:  Orders Placed This Encounter  Procedures  . CT CHEST W CONTRAST  . CT Abdomen W Contrast      Mike Craze, NP [place] [phone]

## 2016-09-24 NOTE — Progress Notes (Signed)
Janet Fletcher presents today for injection per the provider's orders.  Aranesp administration without incident; see MAR for injection details.  Patient tolerated procedure well and without incident.  No questions or complaints noted at this time.

## 2016-09-24 NOTE — Patient Instructions (Addendum)
[place] at [facility] Discharge Instructions  RECOMMENDATIONS MADE BY THE CONSULTANT AND ANY TEST RESULTS WILL BE SENT TO YOUR REFERRING PHYSICIAN.  You were seen today by Mike Craze NP. Continue monthly labs and Aranesp injections. Return in 3 months for CT, labs and follow up.    Thank you for choosing [place] at [facility] to provide your oncology and hematology care.  To afford each patient quality time with our provider, please arrive at least 15 minutes before your scheduled appointment time.    If you have a lab appointment with the [place] please come in thru the  Main Entrance and check in at the main information desk  You need to re-schedule your appointment should you arrive 10 or more minutes late.  We strive to give you quality time with our providers, and arriving late affects you and other patients whose appointments are after yours.  Also, if you no show three or more times for appointments you may be dismissed from the clinic at the providers discretion.     Again, thank you for choosing [facility].  Our hope is that these requests will decrease the amount of time that you wait before being seen by our physicians.       _____________________________________________________________  Should you have questions after your visit to [facility], please contact our office at [phone] between the hours of 8:30 a.m. and 4:30 p.m.  Voicemails left after 4:30 p.m. will not be returned until the following business day.  For prescription refill requests, have your pharmacy contact our office.       Resources For Cancer Patients and their Caregivers ? American Cancer Society: Can assist with transportation, wigs, general needs, runs Look Good Feel Better.        (647) 619-4831 ? Cancer Care: Provides financial assistance, online support groups, medication/co-pay assistance.   1-800-813-HOPE 872-461-0517) ? [place] Assists [place] cancer patients and their families through emotional , educational and financial support.  867-517-8688 ? [place] DSS Where to apply for food stamps, Medicaid and utility assistance. (629)415-3120 ? RCATS: Transportation to medical appointments. 4698393625 ? Social Security Administration: May apply for disability if have a Stage IV cancer. 782-507-6460 [phone] ? [employer], Disability and Transit Services: Assists with nutrition, care and transit needs. [place] Support Programs: '@10RELATIVEDAYS'$ @ > Cancer Support Group  2nd Tuesday of the month 1pm-2pm, Journey Room  > Creative Journey  3rd Tuesday of the month 1130am-1pm, Journey Room  > Look Good Feel Better  1st Wednesday of the month 10am-12 noon, Journey Room (Call [place] to register [phone])

## 2016-09-25 DIAGNOSIS — D649 Anemia, unspecified: Secondary | ICD-10-CM | POA: Diagnosis not present

## 2016-09-25 DIAGNOSIS — R69 Illness, unspecified: Secondary | ICD-10-CM | POA: Diagnosis not present

## 2016-10-08 ENCOUNTER — Encounter (HOSPITAL_BASED_OUTPATIENT_CLINIC_OR_DEPARTMENT_OTHER): Payer: Medicare HMO

## 2016-10-08 ENCOUNTER — Encounter (HOSPITAL_COMMUNITY): Payer: Medicare HMO

## 2016-10-08 ENCOUNTER — Encounter (HOSPITAL_COMMUNITY): Payer: Self-pay

## 2016-10-08 VITALS — BP 107/63 | HR 80 | Temp 97.5°F | Resp 18

## 2016-10-08 DIAGNOSIS — D649 Anemia, unspecified: Secondary | ICD-10-CM

## 2016-10-08 DIAGNOSIS — Z9889 Other specified postprocedural states: Secondary | ICD-10-CM | POA: Diagnosis not present

## 2016-10-08 DIAGNOSIS — F17210 Nicotine dependence, cigarettes, uncomplicated: Secondary | ICD-10-CM | POA: Diagnosis not present

## 2016-10-08 DIAGNOSIS — Z923 Personal history of irradiation: Secondary | ICD-10-CM | POA: Diagnosis not present

## 2016-10-08 DIAGNOSIS — Z9221 Personal history of antineoplastic chemotherapy: Secondary | ICD-10-CM | POA: Diagnosis not present

## 2016-10-08 DIAGNOSIS — Z5189 Encounter for other specified aftercare: Secondary | ICD-10-CM | POA: Diagnosis not present

## 2016-10-08 DIAGNOSIS — C3491 Malignant neoplasm of unspecified part of right bronchus or lung: Secondary | ICD-10-CM | POA: Diagnosis not present

## 2016-10-08 DIAGNOSIS — E538 Deficiency of other specified B group vitamins: Secondary | ICD-10-CM

## 2016-10-08 DIAGNOSIS — E785 Hyperlipidemia, unspecified: Secondary | ICD-10-CM | POA: Diagnosis not present

## 2016-10-08 DIAGNOSIS — R5383 Other fatigue: Secondary | ICD-10-CM | POA: Diagnosis not present

## 2016-10-08 LAB — CBC WITH DIFFERENTIAL/PLATELET
BASOS PCT: 1 %
Basophils Absolute: 0 10*3/uL (ref 0.0–0.1)
EOS ABS: 0.1 10*3/uL (ref 0.0–0.7)
EOS PCT: 2 %
HCT: 27.9 % — ABNORMAL LOW (ref 36.0–46.0)
HEMOGLOBIN: 8.9 g/dL — AB (ref 12.0–15.0)
Lymphocytes Relative: 22 %
Lymphs Abs: 0.6 10*3/uL — ABNORMAL LOW (ref 0.7–4.0)
MCH: 28.3 pg (ref 26.0–34.0)
MCHC: 31.9 g/dL (ref 30.0–36.0)
MCV: 88.6 fL (ref 78.0–100.0)
MONOS PCT: 13 %
Monocytes Absolute: 0.3 10*3/uL (ref 0.1–1.0)
NEUTROS PCT: 62 %
Neutro Abs: 1.6 10*3/uL — ABNORMAL LOW (ref 1.7–7.7)
PLATELETS: 129 10*3/uL — AB (ref 150–400)
RBC: 3.15 MIL/uL — AB (ref 3.87–5.11)
RDW: 15.1 % (ref 11.5–15.5)
WBC: 2.6 10*3/uL — AB (ref 4.0–10.5)

## 2016-10-08 LAB — COMPREHENSIVE METABOLIC PANEL
ALBUMIN: 3.4 g/dL — AB (ref 3.5–5.0)
ALT: 12 U/L — ABNORMAL LOW (ref 14–54)
ANION GAP: 5 (ref 5–15)
AST: 22 U/L (ref 15–41)
Alkaline Phosphatase: 53 U/L (ref 38–126)
BUN: 26 mg/dL — ABNORMAL HIGH (ref 6–20)
CALCIUM: 8.9 mg/dL (ref 8.9–10.3)
CHLORIDE: 105 mmol/L (ref 101–111)
CO2: 28 mmol/L (ref 22–32)
Creatinine, Ser: 1.24 mg/dL — ABNORMAL HIGH (ref 0.44–1.00)
GFR calc Af Amer: 47 mL/min — ABNORMAL LOW (ref >60)
GFR calc non Af Amer: 41 mL/min — ABNORMAL LOW (ref >60)
GLUCOSE: 100 mg/dL — AB (ref 65–99)
Potassium: 4 mmol/L (ref 3.5–5.1)
SODIUM: 138 mmol/L (ref 135–145)
Total Bilirubin: 0.4 mg/dL (ref 0.3–1.2)
Total Protein: 6.3 g/dL — ABNORMAL LOW (ref 6.5–8.1)

## 2016-10-08 MED ORDER — DARBEPOETIN ALFA 60 MCG/0.3ML IJ SOSY
60.0000 ug | PREFILLED_SYRINGE | Freq: Once | INTRAMUSCULAR | Status: AC
  Administered 2016-10-08: 60 ug via SUBCUTANEOUS

## 2016-10-08 MED ORDER — CYANOCOBALAMIN 1000 MCG/ML IJ SOLN
1000.0000 ug | Freq: Once | INTRAMUSCULAR | Status: AC
  Administered 2016-10-08: 1000 ug via INTRAMUSCULAR

## 2016-10-08 MED ORDER — CYANOCOBALAMIN 1000 MCG/ML IJ SOLN
INTRAMUSCULAR | Status: AC
  Filled 2016-10-08: qty 1

## 2016-10-08 MED ORDER — DARBEPOETIN ALFA 60 MCG/0.3ML IJ SOSY
PREFILLED_SYRINGE | INTRAMUSCULAR | Status: AC
  Filled 2016-10-08: qty 0.3

## 2016-10-08 NOTE — Patient Instructions (Signed)
[place] at [facility] Discharge Instructions  RECOMMENDATIONS MADE BY THE CONSULTANT AND ANY TEST RESULTS WILL BE SENT TO YOUR REFERRING PHYSICIAN.  B12 injection and aranesp today Follow up as scheduled.  Thank you for choosing [place] at [facility] to provide your oncology and hematology care.  To afford each patient quality time with our provider, please arrive at least 15 minutes before your scheduled appointment time.    If you have a lab appointment with the [place] please come in thru the  Main Entrance and check in at the main information desk  You need to re-schedule your appointment should you arrive 10 or more minutes late.  We strive to give you quality time with our providers, and arriving late affects you and other patients whose appointments are after yours.  Also, if you no show three or more times for appointments you may be dismissed from the clinic at the providers discretion.     Again, thank you for choosing [facility].  Our hope is that these requests will decrease the amount of time that you wait before being seen by our physicians.       _____________________________________________________________  Should you have questions after your visit to [facility], please contact our office at [phone] between the hours of 8:30 a.m. and 4:30 p.m.  Voicemails left after 4:30 p.m. will not be returned until the following business day.  For prescription refill requests, have your pharmacy contact our office.       Resources For Cancer Patients and their Caregivers ? American Cancer Society: Can assist with transportation, wigs, general needs, runs Look Good Feel Better.        941-555-1746 ? Cancer Care: Provides financial assistance, online support groups, medication/co-pay assistance.  1-800-813-HOPE 669-557-0359) ? [place] Assists [place] cancer  patients and their families through emotional , educational and financial support.  984-190-1344 ? [place] DSS Where to apply for food stamps, Medicaid and utility assistance. 820-778-9788 ? RCATS: Transportation to medical appointments. 480-632-7320 ? Social Security Administration: May apply for disability if have a Stage IV cancer. [phone] 509-553-7780 ? [employer], Disability and Transit Services: Assists with nutrition, care and transit needs. [place] Support Programs: '@10RELATIVEDAYS'$ @ > Cancer Support Group  2nd Tuesday of the month 1pm-2pm, Journey Room  > Creative Journey  3rd Tuesday of the month 1130am-1pm, Journey Room  > Look Good Feel Better  1st Wednesday of the month 10am-12 noon, Journey Room (Call [place] to register [phone])

## 2016-10-08 NOTE — Progress Notes (Signed)
Janet Fletcher presents today for injection per MD orders. B12 1,01mg administered IM in left Upper Arm. Administration without incident. Patient tolerated well.   LNigel Bridgemanpresents today for injection per MD orders. Aranesp 681m administered SQ in right Abdomen. Administration without incident. Patient tolerated well.   Vitals stable and discharged home from clinic ambulatory. Follow up as scheduled.

## 2016-10-14 ENCOUNTER — Other Ambulatory Visit (HOSPITAL_COMMUNITY): Payer: Self-pay

## 2016-10-14 ENCOUNTER — Other Ambulatory Visit (HOSPITAL_COMMUNITY): Payer: Self-pay | Admitting: Oncology

## 2016-10-14 DIAGNOSIS — R5383 Other fatigue: Secondary | ICD-10-CM | POA: Diagnosis not present

## 2016-10-14 DIAGNOSIS — Z923 Personal history of irradiation: Secondary | ICD-10-CM | POA: Diagnosis not present

## 2016-10-14 DIAGNOSIS — C3491 Malignant neoplasm of unspecified part of right bronchus or lung: Secondary | ICD-10-CM | POA: Diagnosis not present

## 2016-10-14 DIAGNOSIS — Z9889 Other specified postprocedural states: Secondary | ICD-10-CM | POA: Diagnosis not present

## 2016-10-14 DIAGNOSIS — D508 Other iron deficiency anemias: Secondary | ICD-10-CM

## 2016-10-14 DIAGNOSIS — Z9221 Personal history of antineoplastic chemotherapy: Secondary | ICD-10-CM | POA: Diagnosis not present

## 2016-10-14 DIAGNOSIS — E785 Hyperlipidemia, unspecified: Secondary | ICD-10-CM | POA: Diagnosis not present

## 2016-10-14 DIAGNOSIS — D649 Anemia, unspecified: Secondary | ICD-10-CM | POA: Diagnosis not present

## 2016-10-14 DIAGNOSIS — F17210 Nicotine dependence, cigarettes, uncomplicated: Secondary | ICD-10-CM | POA: Diagnosis not present

## 2016-10-14 DIAGNOSIS — Z5189 Encounter for other specified aftercare: Secondary | ICD-10-CM | POA: Diagnosis not present

## 2016-10-14 LAB — OCCULT BLOOD X 1 CARD TO LAB, STOOL
Fecal Occult Bld: POSITIVE — AB
Fecal Occult Bld: POSITIVE — AB
Fecal Occult Bld: POSITIVE — AB

## 2016-10-16 ENCOUNTER — Encounter: Payer: Self-pay | Admitting: Gastroenterology

## 2016-10-22 ENCOUNTER — Other Ambulatory Visit (HOSPITAL_COMMUNITY): Payer: Self-pay | Admitting: Oncology

## 2016-10-22 ENCOUNTER — Encounter (HOSPITAL_COMMUNITY): Payer: Medicare HMO

## 2016-10-22 ENCOUNTER — Encounter (HOSPITAL_COMMUNITY): Payer: Medicare HMO | Attending: Oncology

## 2016-10-22 VITALS — BP 109/47 | HR 83 | Temp 97.9°F | Resp 20

## 2016-10-22 DIAGNOSIS — D649 Anemia, unspecified: Secondary | ICD-10-CM

## 2016-10-22 DIAGNOSIS — D508 Other iron deficiency anemias: Secondary | ICD-10-CM

## 2016-10-22 DIAGNOSIS — C3491 Malignant neoplasm of unspecified part of right bronchus or lung: Secondary | ICD-10-CM

## 2016-10-22 DIAGNOSIS — E538 Deficiency of other specified B group vitamins: Secondary | ICD-10-CM | POA: Insufficient documentation

## 2016-10-22 LAB — CBC WITH DIFFERENTIAL/PLATELET
BASOS ABS: 0 10*3/uL (ref 0.0–0.1)
Basophils Relative: 1 %
Eosinophils Absolute: 0.1 10*3/uL (ref 0.0–0.7)
Eosinophils Relative: 2 %
HEMATOCRIT: 28.6 % — AB (ref 36.0–46.0)
HEMOGLOBIN: 8.9 g/dL — AB (ref 12.0–15.0)
LYMPHS PCT: 18 %
Lymphs Abs: 0.6 10*3/uL — ABNORMAL LOW (ref 0.7–4.0)
MCH: 27.4 pg (ref 26.0–34.0)
MCHC: 31.1 g/dL (ref 30.0–36.0)
MCV: 88 fL (ref 78.0–100.0)
MONOS PCT: 7 %
Monocytes Absolute: 0.2 10*3/uL (ref 0.1–1.0)
NEUTROS ABS: 2.3 10*3/uL (ref 1.7–7.7)
Neutrophils Relative %: 73 %
Platelets: 116 10*3/uL — ABNORMAL LOW (ref 150–400)
RBC: 3.25 MIL/uL — ABNORMAL LOW (ref 3.87–5.11)
RDW: 15.8 % — ABNORMAL HIGH (ref 11.5–15.5)
WBC: 3.2 10*3/uL — ABNORMAL LOW (ref 4.0–10.5)

## 2016-10-22 LAB — COMPREHENSIVE METABOLIC PANEL
ALK PHOS: 57 U/L (ref 38–126)
ALT: 11 U/L — ABNORMAL LOW (ref 14–54)
AST: 20 U/L (ref 15–41)
Albumin: 3.5 g/dL (ref 3.5–5.0)
Anion gap: 5 (ref 5–15)
BILIRUBIN TOTAL: 0.5 mg/dL (ref 0.3–1.2)
BUN: 27 mg/dL — AB (ref 6–20)
CALCIUM: 8.9 mg/dL (ref 8.9–10.3)
CO2: 29 mmol/L (ref 22–32)
CREATININE: 1.25 mg/dL — AB (ref 0.44–1.00)
Chloride: 106 mmol/L (ref 101–111)
GFR calc Af Amer: 46 mL/min — ABNORMAL LOW (ref >60)
GFR, EST NON AFRICAN AMERICAN: 40 mL/min — AB (ref >60)
Glucose, Bld: 103 mg/dL — ABNORMAL HIGH (ref 65–99)
POTASSIUM: 4.2 mmol/L (ref 3.5–5.1)
Sodium: 140 mmol/L (ref 135–145)
TOTAL PROTEIN: 6.3 g/dL — AB (ref 6.5–8.1)

## 2016-10-22 LAB — FERRITIN: FERRITIN: 36 ng/mL (ref 11–307)

## 2016-10-22 LAB — IRON AND TIBC
Iron: 24 ug/dL — ABNORMAL LOW (ref 28–170)
Saturation Ratios: 7 % — ABNORMAL LOW (ref 10.4–31.8)
TIBC: 328 ug/dL (ref 250–450)
UIBC: 304 ug/dL

## 2016-10-22 MED ORDER — DARBEPOETIN ALFA 60 MCG/0.3ML IJ SOSY
60.0000 ug | PREFILLED_SYRINGE | Freq: Once | INTRAMUSCULAR | Status: AC
  Administered 2016-10-22: 60 ug via SUBCUTANEOUS

## 2016-10-22 NOTE — Progress Notes (Signed)
Janet Fletcher presents today for injection per MD orders. Aranesp 56mg administered SQ in left Abdomen. Administration without incident. Patient tolerated well.

## 2016-10-28 ENCOUNTER — Ambulatory Visit (HOSPITAL_COMMUNITY): Payer: Medicare HMO

## 2016-10-30 ENCOUNTER — Encounter (HOSPITAL_COMMUNITY): Payer: Self-pay

## 2016-10-30 ENCOUNTER — Encounter (HOSPITAL_BASED_OUTPATIENT_CLINIC_OR_DEPARTMENT_OTHER): Payer: Medicare HMO

## 2016-10-30 VITALS — BP 120/89 | HR 73 | Temp 97.5°F | Resp 18

## 2016-10-30 DIAGNOSIS — E538 Deficiency of other specified B group vitamins: Secondary | ICD-10-CM

## 2016-10-30 MED ORDER — SODIUM CHLORIDE 0.9 % IV SOLN
510.0000 mg | Freq: Once | INTRAVENOUS | Status: AC
  Administered 2016-10-30: 510 mg via INTRAVENOUS
  Filled 2016-10-30: qty 17

## 2016-10-30 MED ORDER — HEPARIN SOD (PORK) LOCK FLUSH 100 UNIT/ML IV SOLN
500.0000 [IU] | Freq: Once | INTRAVENOUS | Status: AC
  Administered 2016-10-30: 500 [IU] via INTRAVENOUS

## 2016-10-30 MED ORDER — SODIUM CHLORIDE 0.9 % IV SOLN
Freq: Once | INTRAVENOUS | Status: AC
  Administered 2016-10-30: 14:00:00 via INTRAVENOUS

## 2016-10-30 NOTE — Progress Notes (Signed)
Patient tolerated infusion well.  VSS.  Patient ambulatory and stable at discharge from clinic.

## 2016-10-30 NOTE — Patient Instructions (Signed)
[place] at [facility] Discharge Instructions  RECOMMENDATIONS MADE BY THE CONSULTANT AND ANY TEST RESULTS WILL BE SENT TO YOUR REFERRING PHYSICIAN.  IV iron  Thank you for choosing [place] at [facility] to provide your oncology and hematology care.  To afford each patient quality time with our provider, please arrive at least 15 minutes before your scheduled appointment time.    If you have a lab appointment with the [place] please come in thru the  Main Entrance and check in at the main information desk  You need to re-schedule your appointment should you arrive 10 or more minutes late.  We strive to give you quality time with our providers, and arriving late affects you and other patients whose appointments are after yours.  Also, if you no show three or more times for appointments you may be dismissed from the clinic at the providers discretion.     Again, thank you for choosing [facility].  Our hope is that these requests will decrease the amount of time that you wait before being seen by our physicians.       _____________________________________________________________  Should you have questions after your visit to [facility], please contact our office at [phone] between the hours of 8:30 a.m. and 4:30 p.m.  Voicemails left after 4:30 p.m. will not be returned until the following business day.  For prescription refill requests, have your pharmacy contact our office.       Resources For Cancer Patients and their Caregivers ? American Cancer Society: Can assist with transportation, wigs, general needs, runs Look Good Feel Better.        602-789-9100 ? Cancer Care: Provides financial assistance, online support groups, medication/co-pay assistance.  1-800-813-HOPE (312) 605-2288) ? [place] Assists [place] cancer patients and their families through emotional ,  educational and financial support.  4342909152 ? [place] DSS Where to apply for food stamps, Medicaid and utility assistance. 458-678-2773 ? RCATS: Transportation to medical appointments. 913-814-4676 ? Social Security Administration: May apply for disability if have a Stage IV cancer. 718-261-3749 [phone] ? [employer], Disability and Transit Services: Assists with nutrition, care and transit needs. [place] Support Programs: '@10RELATIVEDAYS'$ @ > Cancer Support Group  2nd Tuesday of the month 1pm-2pm, Journey Room  > Creative Journey  3rd Tuesday of the month 1130am-1pm, Journey Room  > Look Good Feel Better  1st Wednesday of the month 10am-12 noon, Journey Room (Call [place] to register [phone])

## 2016-11-05 ENCOUNTER — Encounter (HOSPITAL_COMMUNITY): Payer: Self-pay

## 2016-11-05 ENCOUNTER — Encounter (HOSPITAL_BASED_OUTPATIENT_CLINIC_OR_DEPARTMENT_OTHER): Payer: Medicare HMO

## 2016-11-05 ENCOUNTER — Encounter (HOSPITAL_COMMUNITY): Payer: Medicare HMO

## 2016-11-05 ENCOUNTER — Other Ambulatory Visit (HOSPITAL_COMMUNITY): Payer: Self-pay | Admitting: Oncology

## 2016-11-05 VITALS — BP 134/67 | HR 80 | Temp 97.4°F | Resp 18

## 2016-11-05 DIAGNOSIS — C3491 Malignant neoplasm of unspecified part of right bronchus or lung: Secondary | ICD-10-CM

## 2016-11-05 DIAGNOSIS — E538 Deficiency of other specified B group vitamins: Secondary | ICD-10-CM

## 2016-11-05 DIAGNOSIS — D649 Anemia, unspecified: Secondary | ICD-10-CM

## 2016-11-05 LAB — COMPREHENSIVE METABOLIC PANEL
ALBUMIN: 3.6 g/dL (ref 3.5–5.0)
ALT: 11 U/L — ABNORMAL LOW (ref 14–54)
ANION GAP: 6 (ref 5–15)
AST: 22 U/L (ref 15–41)
Alkaline Phosphatase: 56 U/L (ref 38–126)
BUN: 25 mg/dL — ABNORMAL HIGH (ref 6–20)
CO2: 27 mmol/L (ref 22–32)
Calcium: 8.9 mg/dL (ref 8.9–10.3)
Chloride: 105 mmol/L (ref 101–111)
Creatinine, Ser: 1.28 mg/dL — ABNORMAL HIGH (ref 0.44–1.00)
GFR calc Af Amer: 45 mL/min — ABNORMAL LOW (ref >60)
GFR calc non Af Amer: 39 mL/min — ABNORMAL LOW (ref >60)
GLUCOSE: 97 mg/dL (ref 65–99)
POTASSIUM: 4.3 mmol/L (ref 3.5–5.1)
SODIUM: 138 mmol/L (ref 135–145)
TOTAL PROTEIN: 6.4 g/dL — AB (ref 6.5–8.1)
Total Bilirubin: 0.6 mg/dL (ref 0.3–1.2)

## 2016-11-05 LAB — CBC WITH DIFFERENTIAL/PLATELET
BASOS PCT: 1 %
Basophils Absolute: 0 10*3/uL (ref 0.0–0.1)
EOS ABS: 0.1 10*3/uL (ref 0.0–0.7)
Eosinophils Relative: 3 %
HCT: 28.4 % — ABNORMAL LOW (ref 36.0–46.0)
Hemoglobin: 8.9 g/dL — ABNORMAL LOW (ref 12.0–15.0)
LYMPHS ABS: 0.8 10*3/uL (ref 0.7–4.0)
Lymphocytes Relative: 20 %
MCH: 27.1 pg (ref 26.0–34.0)
MCHC: 31.3 g/dL (ref 30.0–36.0)
MCV: 86.6 fL (ref 78.0–100.0)
MONO ABS: 0.3 10*3/uL (ref 0.1–1.0)
MONOS PCT: 9 %
NEUTROS ABS: 2.7 10*3/uL (ref 1.7–7.7)
Neutrophils Relative %: 69 %
Platelets: 118 10*3/uL — ABNORMAL LOW (ref 150–400)
RBC: 3.28 MIL/uL — ABNORMAL LOW (ref 3.87–5.11)
RDW: 17.1 % — AB (ref 11.5–15.5)
WBC: 3.9 10*3/uL — ABNORMAL LOW (ref 4.0–10.5)

## 2016-11-05 MED ORDER — DARBEPOETIN ALFA 60 MCG/0.3ML IJ SOSY
60.0000 ug | PREFILLED_SYRINGE | Freq: Once | INTRAMUSCULAR | Status: AC
  Administered 2016-11-05: 60 ug via SUBCUTANEOUS
  Filled 2016-11-05: qty 0.3

## 2016-11-05 MED ORDER — CYANOCOBALAMIN 1000 MCG/ML IJ SOLN
1000.0000 ug | Freq: Once | INTRAMUSCULAR | Status: AC
  Administered 2016-11-05: 1000 ug via INTRAMUSCULAR
  Filled 2016-11-05: qty 1

## 2016-11-05 NOTE — Patient Instructions (Signed)
[place] at [facility] Discharge Instructions  RECOMMENDATIONS MADE BY THE CONSULTANT AND ANY TEST RESULTS WILL BE SENT TO YOUR REFERRING PHYSICIAN.  Received Vit B12 and Aranesp injections today. Follow-up as scheduled. Call clinic for any questions or concerns  Thank you for choosing [place] at [facility] to provide your oncology and hematology care.  To afford each patient quality time with our provider, please arrive at least 15 minutes before your scheduled appointment time.    If you have a lab appointment with the [place] please come in thru the  Main Entrance and check in at the main information desk  You need to re-schedule your appointment should you arrive 10 or more minutes late.  We strive to give you quality time with our providers, and arriving late affects you and other patients whose appointments are after yours.  Also, if you no show three or more times for appointments you may be dismissed from the clinic at the providers discretion.     Again, thank you for choosing [facility].  Our hope is that these requests will decrease the amount of time that you wait before being seen by our physicians.       _____________________________________________________________  Should you have questions after your visit to [facility], please contact our office at [phone] between the hours of 8:30 a.m. and 4:30 p.m.  Voicemails left after 4:30 p.m. will not be returned until the following business day.  For prescription refill requests, have your pharmacy contact our office.       Resources For Cancer Patients and their Caregivers ? American Cancer Society: Can assist with transportation, wigs, general needs, runs Look Good Feel Better.        [phone] ? Cancer Care: Provides financial assistance, online support groups, medication/co-pay assistance.  1-800-813-HOPE (424)683-0427) ? [facility] Assists [place] cancer patients and their families through emotional , educational and financial support.  (307)175-1729 ? [place] DSS Where to apply for food stamps, Medicaid and utility assistance. (587) 853-4845 ? RCATS: Transportation to medical appointments. 262-560-8091 ? Social Security Administration: May apply for disability if have a Stage IV cancer. 4245023271 [phone] ? [employer], Disability and Transit Services: Assists with nutrition, care and transit needs. [place] Support Programs: '@10RELATIVEDAYS'$ @ > Cancer Support Group  2nd Tuesday of the month 1pm-2pm, Journey Room  > Creative Journey  3rd Tuesday of the month 1130am-1pm, Journey Room  > Look Good Feel Better  1st Wednesday of the month 10am-12 noon, Journey Room (Call [place] to register [phone])

## 2016-11-05 NOTE — Progress Notes (Signed)
Janet Fletcher tolerated Aranesp and Vit B12 injections well without complaints or incident. Hgb 6.9 VSS Pt discharged self ambulatory in satisfactory condition accompanied by her daughtert

## 2016-11-07 ENCOUNTER — Encounter: Payer: Self-pay | Admitting: Nurse Practitioner

## 2016-11-07 ENCOUNTER — Ambulatory Visit (INDEPENDENT_AMBULATORY_CARE_PROVIDER_SITE_OTHER): Payer: Medicare HMO | Admitting: Nurse Practitioner

## 2016-11-07 ENCOUNTER — Other Ambulatory Visit: Payer: Self-pay

## 2016-11-07 VITALS — BP 112/63 | HR 82 | Temp 97.2°F | Ht 64.0 in | Wt 123.6 lb

## 2016-11-07 DIAGNOSIS — D508 Other iron deficiency anemias: Secondary | ICD-10-CM

## 2016-11-07 DIAGNOSIS — R195 Other fecal abnormalities: Secondary | ICD-10-CM | POA: Diagnosis not present

## 2016-11-07 DIAGNOSIS — D649 Anemia, unspecified: Secondary | ICD-10-CM

## 2016-11-07 NOTE — Assessment & Plan Note (Signed)
Heme positive stool 3. Anemia likely contributed at least in part to by GI bleed. The patient denies any hematochezia or melena. Insists that she looks every time. We will proceed with colonoscopy and endoscopy as per above.

## 2016-11-07 NOTE — Assessment & Plan Note (Signed)
The patient has chronic iron deficiency anemia and continues to follow with hematology/oncology. Has completed radiation and chemotherapy for lung cancer. She had a drop in her hemoglobin from 9.5-8.9 with subsequent 3 positive heme stool cards for which she was referred to [place]. She has not had a colonoscopy "and a very long time." Never had an endoscopy before. She feels her energy level is good but her daughters feel otherwise. No other symptoms of anemia at this time. We will proceed with endoscopy and colonoscopy. Can consider Givens capsule study if both of those are negative.  Proceed with colonoscopy and EGD with Dr. Oneida Alar in the near future. The risks, benefits, and alternatives have been discussed in detail with the patient. They state understanding and desire to proceed.   The patient is not on any anticoagulants, anxiolytics, chronic pain medications, or antidepressants. Denies alcohol and drug use. Conscious sedation should be adequate for her procedure.

## 2016-11-07 NOTE — Progress Notes (Addendum)
REVIEWED-NO ADDITIONAL RECOMMENDATIONS.  Primary Care Physician:  Chevis Pretty, FNP Primary Gastroenterologist:  Dr. Oneida Alar  Chief Complaint  Patient presents with  . Rectal Bleeding    HPI:   Janet Fletcher is a 81 y.o. female who presents on referral from oncology. The patient was last seen by oncology 09/24/2016 for small cell lung cancer and anemia. Her daughter accompanied her to her last oncology visit and at that time "sometimes seems weak and sluggish." Also concerns about short-term memory loss. Denied any forgetfulness and fatigue, frank bleeding. Related to anemia her hemoglobin was 9.5. Continues on Aranesp and monthly B12 injections. Subsequent CBC 2 weeks later showed hemoglobin of 8.9 and she was given fecal occult blood cards which were positive 3.  No history of colonoscopy or endoscopy found in our system.  Today she states she's doing well overall. Energy fine. Denies hematochezia, melena. Her last colonoscopy was "a long time ago" and not sure where it was at. Denies abdominal pain, N/V. She lost significant weight on chemo but has been starting to gain back. Strength is improving. He daughter feels she still has decreased energy. Denies fever, chills. Has a bowel movement daily which is soft. Denies chest pain, dyspnea, dizziness, lightheadedness, syncope, near syncope. Denies any other upper or lower GI symptoms.  Past Medical History:  Diagnosis Date  . Allergy   . Cancer ([place])    lung  . Hyperlipidemia   . Iron deficiency anemia 12/15/2015  . Low vitamin B12 level 12/15/2015  . Osteopenia     Past Surgical History:  Procedure Laterality Date  . CATARACT EXTRACTION, BILATERAL Bilateral   . MINOR EXCISION EAR CANAL CYST Left    30+ years ago  . SMALL INTESTINE SURGERY    . VIDEO BRONCHOSCOPY WITH ENDOBRONCHIAL ULTRASOUND N/A 12/11/2015   Procedure: VIDEO BRONCHOSCOPY WITH ENDOBRONCHIAL ULTRASOUND;  Surgeon: Melrose Nakayama, MD;  Location: [facility]  OR;  Service: Thoracic;  Laterality: N/A;    Current Outpatient Prescriptions  Medication Sig Dispense Refill  . naproxen sodium (ANAPROX) 220 MG tablet Take 220 mg by mouth 2 (two) times daily with a meal.    . simvastatin (ZOCOR) 40 MG tablet TAKE 1 TABLET (40 MG TOTAL) BY MOUTH AT BEDTIME. 90 tablet 0   No current facility-administered medications for this visit.     Allergies as of 11/07/2016 - Review Complete 11/07/2016  Allergen Reaction Noted  . Acetaminophen Other (See Comments) 10/11/2015  . Flagyl [metronidazole hcl] Other (See Comments) 08/05/2011  . Penicillins Other (See Comments) 08/05/2011    Family History  Problem Relation Age of Onset  . Cancer Mother   . Hypertension Father   . Colon cancer Neg Hx     Social History   Social History  . Marital status: Married    Spouse name: N/A  . Number of children: N/A  . Years of education: N/A   Occupational History  . Not on file.   Social History Main Topics  . Smoking status: Former Smoker    Packs/day: 0.25    Years: 50.00    Quit date: 01/08/2016  . Smokeless tobacco: Never Used  . Alcohol use No  . Drug use: No  . Sexual activity: Not on file   Other Topics Concern  . Not on file   Social History Narrative  . No narrative on file    Review of Systems: Complete ROS negative except as per HPI.    Physical Exam: BP 112/63  Pulse 82   Temp 97.2 F (36.2 C) (Oral)   Ht '5\' 4"'$  (1.626 m)   Wt 123 lb 9.6 oz (56.1 kg)   BMI 21.22 kg/m  General:   Alert and oriented. Pleasant and cooperative. Well-nourished and well-developed.  Head:  Normocephalic and atraumatic. Eyes:  Without icterus, sclera clear and conjunctiva pink.  Ears:  Normal auditory acuity. Cardiovascular:  S1, S2 present without murmurs appreciated. Extremities without clubbing or edema. Respiratory:  Clear to auscultation bilaterally. No wheezes, rales, or rhonchi. No distress.  Gastrointestinal:  +BS, soft, non-tender and  non-distended. No HSM noted. No guarding or rebound. No masses appreciated.  Rectal:  Deferred  Musculoskalatal:  Symmetrical without gross deformities. Skin:  Intact without significant lesions or rashes. Neurologic:  Alert and oriented x4;  grossly normal neurologically. Psych:  Alert and cooperative. Normal mood and affect. Heme/Lymph/Immune: No excessive bruising noted.    11/07/2016 8:52 AM   Disclaimer: This note was dictated with voice recognition software. Similar sounding words can inadvertently be transcribed and may not be corrected upon review.

## 2016-11-07 NOTE — Patient Instructions (Signed)
1. We will schedule your procedures for you. 2. Further recommendations to be made based on the results of your procedures. 3. Return for follow-up in 2 months.

## 2016-11-07 NOTE — Progress Notes (Signed)
cc'ed to pcp °

## 2016-11-11 ENCOUNTER — Telehealth: Payer: Self-pay

## 2016-11-11 NOTE — Telephone Encounter (Signed)
Called [employer] for PA for TCS/EGD. No PA needed. Ref# [id number].

## 2016-11-18 ENCOUNTER — Other Ambulatory Visit (HOSPITAL_COMMUNITY): Payer: Self-pay | Admitting: *Deleted

## 2016-11-18 DIAGNOSIS — C3491 Malignant neoplasm of unspecified part of right bronchus or lung: Secondary | ICD-10-CM

## 2016-11-19 ENCOUNTER — Encounter (HOSPITAL_COMMUNITY): Payer: Medicare HMO | Attending: Oncology

## 2016-11-19 ENCOUNTER — Encounter (HOSPITAL_COMMUNITY): Payer: Self-pay

## 2016-11-19 ENCOUNTER — Encounter (HOSPITAL_COMMUNITY): Payer: Medicare HMO

## 2016-11-19 VITALS — BP 89/53 | HR 82 | Temp 97.6°F | Resp 18

## 2016-11-19 DIAGNOSIS — C3491 Malignant neoplasm of unspecified part of right bronchus or lung: Secondary | ICD-10-CM

## 2016-11-19 DIAGNOSIS — E538 Deficiency of other specified B group vitamins: Secondary | ICD-10-CM

## 2016-11-19 DIAGNOSIS — D649 Anemia, unspecified: Secondary | ICD-10-CM

## 2016-11-19 LAB — CBC WITH DIFFERENTIAL/PLATELET
BASOS ABS: 0 10*3/uL (ref 0.0–0.1)
Basophils Relative: 1 %
Eosinophils Absolute: 0.2 10*3/uL (ref 0.0–0.7)
Eosinophils Relative: 5 %
HEMATOCRIT: 28.2 % — AB (ref 36.0–46.0)
HEMOGLOBIN: 8.8 g/dL — AB (ref 12.0–15.0)
LYMPHS PCT: 19 %
Lymphs Abs: 0.6 10*3/uL — ABNORMAL LOW (ref 0.7–4.0)
MCH: 27.3 pg (ref 26.0–34.0)
MCHC: 31.2 g/dL (ref 30.0–36.0)
MCV: 87.6 fL (ref 78.0–100.0)
MONO ABS: 0.3 10*3/uL (ref 0.1–1.0)
Monocytes Relative: 8 %
NEUTROS ABS: 2.2 10*3/uL (ref 1.7–7.7)
NEUTROS PCT: 68 %
PLATELETS: 84 10*3/uL — AB (ref 150–400)
RBC: 3.22 MIL/uL — ABNORMAL LOW (ref 3.87–5.11)
RDW: 18.7 % — ABNORMAL HIGH (ref 11.5–15.5)
WBC: 3.3 10*3/uL — ABNORMAL LOW (ref 4.0–10.5)

## 2016-11-19 LAB — COMPREHENSIVE METABOLIC PANEL
ALT: 12 U/L — ABNORMAL LOW (ref 14–54)
AST: 22 U/L (ref 15–41)
Albumin: 3.5 g/dL (ref 3.5–5.0)
Alkaline Phosphatase: 57 U/L (ref 38–126)
Anion gap: 4 — ABNORMAL LOW (ref 5–15)
BUN: 23 mg/dL — AB (ref 6–20)
CHLORIDE: 108 mmol/L (ref 101–111)
CO2: 28 mmol/L (ref 22–32)
Calcium: 8.8 mg/dL — ABNORMAL LOW (ref 8.9–10.3)
Creatinine, Ser: 1.14 mg/dL — ABNORMAL HIGH (ref 0.44–1.00)
GFR, EST AFRICAN AMERICAN: 52 mL/min — AB (ref >60)
GFR, EST NON AFRICAN AMERICAN: 45 mL/min — AB (ref >60)
Glucose, Bld: 108 mg/dL — ABNORMAL HIGH (ref 65–99)
POTASSIUM: 4.1 mmol/L (ref 3.5–5.1)
SODIUM: 140 mmol/L (ref 135–145)
Total Bilirubin: 0.6 mg/dL (ref 0.3–1.2)
Total Protein: 6.1 g/dL — ABNORMAL LOW (ref 6.5–8.1)

## 2016-11-19 LAB — IRON AND TIBC
IRON: 28 ug/dL (ref 28–170)
SATURATION RATIOS: 10 % — AB (ref 10.4–31.8)
TIBC: 281 ug/dL (ref 250–450)
UIBC: 253 ug/dL

## 2016-11-19 LAB — FERRITIN: FERRITIN: 135 ng/mL (ref 11–307)

## 2016-11-19 MED ORDER — DARBEPOETIN ALFA 100 MCG/0.5ML IJ SOSY
80.0000 ug | PREFILLED_SYRINGE | Freq: Once | INTRAMUSCULAR | Status: AC
  Administered 2016-11-19: 80 ug via SUBCUTANEOUS

## 2016-11-19 MED ORDER — DARBEPOETIN ALFA 100 MCG/0.5ML IJ SOSY
PREFILLED_SYRINGE | INTRAMUSCULAR | Status: AC
  Filled 2016-11-19: qty 0.5

## 2016-11-19 NOTE — Patient Instructions (Signed)
[facility] at [facility] Discharge Instructions  RECOMMENDATIONS MADE BY THE CONSULTANT AND ANY TEST RESULTS WILL BE SENT TO YOUR REFERRING PHYSICIAN.  Aranesp given today  Follow up as scheduled.  Thank you for choosing [facility] at [facility] to provide your oncology and hematology care.  To afford each patient quality time with our provider, please arrive at least 15 minutes before your scheduled appointment time.    If you have a lab appointment with the Cancer Center please come in thru the  Main Entrance and check in at the main information desk  You need to re-schedule your appointment should you arrive 10 or more minutes late.  We strive to give you quality time with our providers, and arriving late affects you and other patients whose appointments are after yours.  Also, if you no show three or more times for appointments you may be dismissed from the clinic at the providers discretion.     Again, thank you for choosing [facility].  Our hope is that these requests will decrease the amount of time that you wait before being seen by our physicians.       _____________________________________________________________  Should you have questions after your visit to [facility], please contact our office at [phone] between the hours of 8:30 a.m. and 4:30 p.m.  Voicemails left after 4:30 p.m. will not be returned until the following business day.  For prescription refill requests, have your pharmacy contact our office.       Resources For Cancer Patients and their Caregivers ? American Cancer Society: Can assist with transportation, wigs, general needs, runs Look Good Feel Better.        1-888-227-6333 ? Cancer Care: Provides financial assistance, online support groups, medication/co-pay assistance.  1-800-813-HOPE (4673) ? [facility] Assists [place] cancer patients and  their families through emotional , educational and financial support.  336-427-4357 ? [place] DSS Where to apply for food stamps, Medicaid and utility assistance. 336-342-1394 ? [employer]: Transportation to medical appointments. 336-347-2287 ? Social Security Administration: May apply for disability if have a Stage IV cancer. 336-342-7796 1-800-772-1213 ? [place] A[employer], Disability and Transit Services: Assists with nutrition, care and transit needs. 336-349-2343  Cancer Center Support Programs: @10RELATIVEDAYS@ > Cancer Support Group  2nd Tuesday of the month 1pm-2pm, Journey Room  > Creative Journey  3rd Tuesday of the month 1130am-1pm, Journey Room  > Look Good Feel Better  1st Wednesday of the month 10am-12 noon, Journey Room (Call [employer] to register [phone])   

## 2016-11-19 NOTE — Progress Notes (Signed)
Janet Fletcher presents today for injection per MD orders.Aranesp 12mg administered SQ in left Abdomen. Administration without incident. Patient tolerated well.  BP 89/53, patient is asymptomatic, labs reviewed by GMike CrazeNP. No change to treatment at this time. Rechecked BP 118/60. Vitals stable and discharged home from clinic ambulatory. Follow up as scheduled.

## 2016-12-02 ENCOUNTER — Other Ambulatory Visit (HOSPITAL_COMMUNITY): Payer: Self-pay | Admitting: *Deleted

## 2016-12-02 DIAGNOSIS — C3491 Malignant neoplasm of unspecified part of right bronchus or lung: Secondary | ICD-10-CM

## 2016-12-03 ENCOUNTER — Encounter (HOSPITAL_COMMUNITY): Payer: Self-pay

## 2016-12-03 ENCOUNTER — Encounter (HOSPITAL_COMMUNITY): Payer: Medicare HMO

## 2016-12-03 ENCOUNTER — Other Ambulatory Visit (HOSPITAL_COMMUNITY): Payer: Self-pay | Admitting: Oncology

## 2016-12-03 ENCOUNTER — Encounter (HOSPITAL_BASED_OUTPATIENT_CLINIC_OR_DEPARTMENT_OTHER): Payer: Medicare HMO

## 2016-12-03 VITALS — BP 122/61 | HR 73 | Temp 98.0°F | Resp 18

## 2016-12-03 DIAGNOSIS — C3491 Malignant neoplasm of unspecified part of right bronchus or lung: Secondary | ICD-10-CM | POA: Diagnosis not present

## 2016-12-03 DIAGNOSIS — E538 Deficiency of other specified B group vitamins: Secondary | ICD-10-CM | POA: Diagnosis not present

## 2016-12-03 DIAGNOSIS — D649 Anemia, unspecified: Secondary | ICD-10-CM

## 2016-12-03 LAB — COMPREHENSIVE METABOLIC PANEL
ALK PHOS: 56 U/L (ref 38–126)
ALT: 12 U/L — AB (ref 14–54)
AST: 21 U/L (ref 15–41)
Albumin: 3.5 g/dL (ref 3.5–5.0)
Anion gap: 6 (ref 5–15)
BILIRUBIN TOTAL: 0.5 mg/dL (ref 0.3–1.2)
BUN: 30 mg/dL — ABNORMAL HIGH (ref 6–20)
CALCIUM: 8.8 mg/dL — AB (ref 8.9–10.3)
CO2: 28 mmol/L (ref 22–32)
CREATININE: 1.32 mg/dL — AB (ref 0.44–1.00)
Chloride: 106 mmol/L (ref 101–111)
GFR, EST AFRICAN AMERICAN: 44 mL/min — AB (ref >60)
GFR, EST NON AFRICAN AMERICAN: 38 mL/min — AB (ref >60)
Glucose, Bld: 116 mg/dL — ABNORMAL HIGH (ref 65–99)
Potassium: 4.2 mmol/L (ref 3.5–5.1)
SODIUM: 140 mmol/L (ref 135–145)
TOTAL PROTEIN: 6.1 g/dL — AB (ref 6.5–8.1)

## 2016-12-03 LAB — CBC WITH DIFFERENTIAL/PLATELET
Basophils Absolute: 0 10*3/uL (ref 0.0–0.1)
Basophils Relative: 0 %
Eosinophils Absolute: 0.1 10*3/uL (ref 0.0–0.7)
Eosinophils Relative: 4 %
HEMATOCRIT: 27.9 % — AB (ref 36.0–46.0)
HEMOGLOBIN: 8.7 g/dL — AB (ref 12.0–15.0)
LYMPHS ABS: 0.7 10*3/uL (ref 0.7–4.0)
Lymphocytes Relative: 24 %
MCH: 27.1 pg (ref 26.0–34.0)
MCHC: 31.2 g/dL (ref 30.0–36.0)
MCV: 86.9 fL (ref 78.0–100.0)
Monocytes Absolute: 0.2 10*3/uL (ref 0.1–1.0)
Monocytes Relative: 7 %
NEUTROS PCT: 65 %
Neutro Abs: 1.9 10*3/uL (ref 1.7–7.7)
Platelets: 100 10*3/uL — ABNORMAL LOW (ref 150–400)
RBC: 3.21 MIL/uL — AB (ref 3.87–5.11)
RDW: 19.2 % — ABNORMAL HIGH (ref 11.5–15.5)
WBC: 2.9 10*3/uL — AB (ref 4.0–10.5)

## 2016-12-03 MED ORDER — DARBEPOETIN ALFA 100 MCG/0.5ML IJ SOSY
80.0000 ug | PREFILLED_SYRINGE | Freq: Once | INTRAMUSCULAR | Status: AC
  Administered 2016-12-03: 80 ug via SUBCUTANEOUS

## 2016-12-03 MED ORDER — DARBEPOETIN ALFA 100 MCG/0.5ML IJ SOSY
PREFILLED_SYRINGE | INTRAMUSCULAR | Status: AC
  Filled 2016-12-03: qty 0.5

## 2016-12-03 MED ORDER — CYANOCOBALAMIN 1000 MCG/ML IJ SOLN
INTRAMUSCULAR | Status: AC
  Filled 2016-12-03: qty 1

## 2016-12-03 MED ORDER — CYANOCOBALAMIN 1000 MCG/ML IJ SOLN
1000.0000 ug | Freq: Once | INTRAMUSCULAR | Status: AC
  Administered 2016-12-03: 1000 ug via INTRAMUSCULAR

## 2016-12-03 NOTE — Patient Instructions (Signed)
[place] at [facility] Discharge Instructions  RECOMMENDATIONS MADE BY THE CONSULTANT AND ANY TEST RESULTS WILL BE SENT TO YOUR REFERRING PHYSICIAN.  Aranesp and B12 today.    Thank you for choosing [place] at [facility] to provide your oncology and hematology care.  To afford each patient quality time with our provider, please arrive at least 15 minutes before your scheduled appointment time.    If you have a lab appointment with the [place] please come in thru the  Main Entrance and check in at the main information desk  You need to re-schedule your appointment should you arrive 10 or more minutes late.  We strive to give you quality time with our providers, and arriving late affects you and other patients whose appointments are after yours.  Also, if you no show three or more times for appointments you may be dismissed from the clinic at the providers discretion.     Again, thank you for choosing [facility].  Our hope is that these requests will decrease the amount of time that you wait before being seen by our physicians.       _____________________________________________________________  Should you have questions after your visit to [facility], please contact our office at [phone] between the hours of 8:30 a.m. and 4:30 p.m.  Voicemails left after 4:30 p.m. will not be returned until the following business day.  For prescription refill requests, have your pharmacy contact our office.       Resources For Cancer Patients and their Caregivers ? American Cancer Society: Can assist with transportation, wigs, general needs, runs Look Good Feel Better.        4055681976 ? Cancer Care: Provides financial assistance, online support groups, medication/co-pay assistance.  1-800-813-HOPE (430)795-5366) ? [place] Assists [place] cancer patients and their families through  emotional , educational and financial support.  934-309-9272 ? [place] DSS Where to apply for food stamps, Medicaid and utility assistance. 631-173-3914 ? [employer]: Transportation to medical appointments. 306-338-8092 ? Social Security Administration: May apply for disability if have a Stage IV cancer. 712-265-7485 231-411-5951 ? [employer], Disability and Transit Services: Assists with nutrition, care and transit needs. [place] Support Programs: '@10RELATIVEDAYS'$ @ > Cancer Support Group  2nd Tuesday of the month 1pm-2pm, Journey Room  > Creative Journey  3rd Tuesday of the month 1130am-1pm, Journey Room  > Look Good Feel Better  1st Wednesday of the month 10am-12 noon, Journey Room (Call [facility] to register [phone])

## 2016-12-03 NOTE — Progress Notes (Signed)
Janet Fletcher presents today for injection per MD orders. Aranesp 54mg administered SQ in right Abdomen.  B12 1001m administered IM in left upper arm.   Administrations without incident. Patient tolerated well.

## 2016-12-05 ENCOUNTER — Other Ambulatory Visit: Payer: Self-pay | Admitting: Nurse Practitioner

## 2016-12-06 ENCOUNTER — Ambulatory Visit (HOSPITAL_COMMUNITY)
Admission: RE | Admit: 2016-12-06 | Discharge: 2016-12-06 | Disposition: A | Discharge status: Home / Self Care | Payer: Medicare HMO | Source: Ambulatory Visit | Attending: Gastroenterology | Admitting: Gastroenterology

## 2016-12-06 ENCOUNTER — Encounter (HOSPITAL_COMMUNITY): Payer: Self-pay

## 2016-12-06 ENCOUNTER — Encounter (HOSPITAL_COMMUNITY)
Admission: RE | Disposition: A | Discharge status: Home / Self Care | Payer: Self-pay | Source: Ambulatory Visit | Attending: Gastroenterology

## 2016-12-06 DIAGNOSIS — B9681 Helicobacter pylori [H. pylori] as the cause of diseases classified elsewhere: Secondary | ICD-10-CM | POA: Insufficient documentation

## 2016-12-06 DIAGNOSIS — K648 Other hemorrhoids: Secondary | ICD-10-CM | POA: Diagnosis not present

## 2016-12-06 DIAGNOSIS — Z791 Long term (current) use of non-steroidal anti-inflammatories (NSAID): Secondary | ICD-10-CM | POA: Insufficient documentation

## 2016-12-06 DIAGNOSIS — Z79899 Other long term (current) drug therapy: Secondary | ICD-10-CM | POA: Diagnosis not present

## 2016-12-06 DIAGNOSIS — D500 Iron deficiency anemia secondary to blood loss (chronic): Secondary | ICD-10-CM | POA: Insufficient documentation

## 2016-12-06 DIAGNOSIS — K2950 Unspecified chronic gastritis without bleeding: Secondary | ICD-10-CM | POA: Insufficient documentation

## 2016-12-06 DIAGNOSIS — Z880 Allergy status to penicillin: Secondary | ICD-10-CM | POA: Insufficient documentation

## 2016-12-06 DIAGNOSIS — E785 Hyperlipidemia, unspecified: Secondary | ICD-10-CM | POA: Insufficient documentation

## 2016-12-06 DIAGNOSIS — R195 Other fecal abnormalities: Secondary | ICD-10-CM | POA: Diagnosis not present

## 2016-12-06 DIAGNOSIS — K5730 Diverticulosis of large intestine without perforation or abscess without bleeding: Secondary | ICD-10-CM | POA: Diagnosis not present

## 2016-12-06 DIAGNOSIS — Z87891 Personal history of nicotine dependence: Secondary | ICD-10-CM | POA: Diagnosis not present

## 2016-12-06 DIAGNOSIS — K2970 Gastritis, unspecified, without bleeding: Secondary | ICD-10-CM

## 2016-12-06 DIAGNOSIS — D649 Anemia, unspecified: Secondary | ICD-10-CM | POA: Diagnosis not present

## 2016-12-06 DIAGNOSIS — K2951 Unspecified chronic gastritis with bleeding: Secondary | ICD-10-CM | POA: Diagnosis not present

## 2016-12-06 HISTORY — PX: ESOPHAGOGASTRODUODENOSCOPY: SHX5428

## 2016-12-06 HISTORY — PX: COLONOSCOPY: SHX5424

## 2016-12-06 SURGERY — COLONOSCOPY
Anesthesia: Moderate Sedation

## 2016-12-06 MED ORDER — LIDOCAINE VISCOUS 2 % MT SOLN
OROMUCOSAL | Status: AC
  Filled 2016-12-06: qty 15

## 2016-12-06 MED ORDER — OXYCODONE HCL 5 MG PO TABS: ORAL_TABLET | ORAL | 0 refills | Status: DC

## 2016-12-06 MED ORDER — STERILE WATER FOR IRRIGATION IR SOLN
Status: DC | PRN
  Administered 2016-12-06: 2.5 mL

## 2016-12-06 MED ORDER — SODIUM CHLORIDE 0.9 % IV SOLN
INTRAVENOUS | Status: DC
  Administered 2016-12-06: 13:00:00 via INTRAVENOUS

## 2016-12-06 MED ORDER — MEPERIDINE HCL 100 MG/ML IJ SOLN
INTRAMUSCULAR | Status: DC | PRN
  Administered 2016-12-06: 25 mg via INTRAVENOUS

## 2016-12-06 MED ORDER — MIDAZOLAM HCL 5 MG/5ML IJ SOLN
INTRAMUSCULAR | Status: AC
  Filled 2016-12-06: qty 10

## 2016-12-06 MED ORDER — LIDOCAINE VISCOUS 2 % MT SOLN
OROMUCOSAL | Status: DC | PRN
  Administered 2016-12-06: 1 via OROMUCOSAL

## 2016-12-06 MED ORDER — MEPERIDINE HCL 100 MG/ML IJ SOLN
INTRAMUSCULAR | Status: AC
  Filled 2016-12-06: qty 2

## 2016-12-06 MED ORDER — OMEPRAZOLE 20 MG PO CPDR: DELAYED_RELEASE_CAPSULE | ORAL | 3 refills | Status: DC

## 2016-12-06 MED ORDER — MIDAZOLAM HCL 5 MG/5ML IJ SOLN
INTRAMUSCULAR | Status: DC | PRN
  Administered 2016-12-06: 1 mg via INTRAVENOUS
  Administered 2016-12-06: 2 mg via INTRAVENOUS

## 2016-12-06 NOTE — Op Note (Signed)
[facility] Patient Name: Janet Fletcher Procedure Date: 12/06/2016 12:51 PM MRN: [id number] Date of Birth: 1935/12/21 Attending MD: Barney Drain , MD CSN: [id number] Age: 81 Admit Type: Outpatient Procedure:                Colonoscopy, DIAGNOSTIC Indications:              Heme positive stool, Anemia-NORMOCYTIC-2018: NL                            FERRITN AND B12, PMHx: CRI, LUNG CA Providers:                Barney Drain, MD, Otis Peak B. Sharon Seller, RN, Randa Spike, Technician Referring MD:             Chevis Pretty FNP Medicines:                Meperidine 25 mg IV, Midazolam 3 mg IV Complications:            No immediate complications. Estimated Blood Loss:     Estimated blood loss: none. Procedure:                Pre-Anesthesia Assessment:                           - Prior to the procedure, a History and Physical                            was performed, and patient medications and                            allergies were reviewed. The patient's tolerance of                            previous anesthesia was also reviewed. The risks                            and benefits of the procedure and the sedation                            options and risks were discussed with the patient.                            All questions were answered, and informed consent                            was obtained. Prior Anticoagulants: The patient has                            taken previous NSAID medication, last dose was 1                            day prior to procedure. ASA Grade Assessment: II -  A patient with mild systemic disease. After                            reviewing the risks and benefits, the patient was                            deemed in satisfactory condition to undergo the                            procedure. After obtaining informed consent, the                            colonoscope was passed under direct  vision.                            Throughout the procedure, the patient's blood                            pressure, pulse, and oxygen saturations were                            monitored continuously. The [id number] ([id number])                            scope was introduced through the anus and advanced                            to the 5 cm into the ileum. The colonoscopy was                            somewhat difficult due to a tortuous colon.                            Successful completion of the procedure was aided by                            COLOWRAP. The patient tolerated the procedure                            fairly well. The quality of the bowel preparation                            was excellent. The terminal ileum, ileocecal valve,                            appendiceal orifice, and rectum were photographed. Scope In: 1:26:57 PM Scope Out: 1:45:13 PM Scope Withdrawal Time: 0 hours 12 minutes 14 seconds  Total Procedure Duration: 0 hours 18 minutes 16 seconds  Findings:      The terminal ileum appeared normal.      The recto-sigmoid colon and sigmoid colon were moderately redundant.      A few small-mouthed diverticula were found in the recto-sigmoid colon.      Non-bleeding internal hemorrhoids were found during retroflexion. The  hemorrhoids were moderate. Impression:               - NO SOURCE FOR NORMOCYTIC ANEMIA IDENTIFIED                           - Redundant LEFT colon.                           - Diverticulosis in the recto-sigmoid colon.                           - HEME POSITIVE STOOL DUE TO Internal hemorrhoids. Moderate Sedation:      Moderate (conscious) sedation was administered by the endoscopy nurse       and supervised by the endoscopist. The following parameters were       monitored: oxygen saturation, heart rate, blood pressure, and response       to care. Total physician intraservice time was 36 minutes. Recommendation:           - High fiber  diet.                           - Continue present medications.                           - Return to my office in 3 months.                           - Patient has a contact number available for                            emergencies. The signs and symptoms of potential                            delayed complications were discussed with the                            patient. Return to normal activities tomorrow.                            Written discharge instructions were provided to the                            patient.                           - No repeat colonoscopy due to age. Procedure Code(s):        --- Professional ---                           [phone], Colonoscopy, flexible; diagnostic, including                            collection of specimen(s) by brushing or washing,                            when performed (separate procedure)  78938, Moderate sedation services provided by the                            same physician or other qualified health care                            professional performing the diagnostic or                            therapeutic service that the sedation supports,                            requiring the presence of an independent trained                            observer to assist in the monitoring of the                            patient's level of consciousness and physiological                            status; initial 15 minutes of intraservice time,                            patient age 72 years or older                           [phone], Moderate sedation services; each additional                            15 minutes intraservice time Diagnosis Code(s):        --- Professional ---                           K64.8, Other hemorrhoids                           R19.5, Other fecal abnormalities                           D64.9, Anemia, unspecified                           K57.30, Diverticulosis of large  intestine without                            perforation or abscess without bleeding                           Q43.8, Other specified congenital malformations of                            intestine CPT copyright 2016 American Medical Association. All rights reserved. The codes documented in this report are preliminary and upon coder review may  be revised to meet current compliance requirements. Barney Drain, MD Barney Drain, MD 12/06/2016  2:20:15 PM This report has been signed electronically. Number of Addenda: 0

## 2016-12-06 NOTE — Op Note (Signed)
[facility] Patient Name: Janet Fletcher Procedure Date: 12/06/2016 1:49 PM MRN: [id number] Date of Birth: 1935-08-29 Attending MD: Barney Drain , MD CSN: [id number] Age: 81 Admit Type: Outpatient Procedure:                Upper GI endoscopy WITH COLD FORCEPS BIOPSY Indications:              Anemia-NORMOCYTIC , NL FERRITIN AND B12, PMHx: CRI,                            LUNG CA. TAKES NAPROXEN. Providers:                Barney Drain, MD, Lurline Del, RN, Randa Spike,                            Technician Referring MD:             Chevis Pretty FNP Medicines:                TCS + Midazolam 1 mg IV Complications:            No immediate complications. Estimated Blood Loss:     Estimated blood loss was minimal. Procedure:                Pre-Anesthesia Assessment:                           - Prior to the procedure, a History and Physical                            was performed, and patient medications and                            allergies were reviewed. The patient's tolerance of                            previous anesthesia was also reviewed. The risks                            and benefits of the procedure and the sedation                            options and risks were discussed with the patient.                            All questions were answered, and informed consent                            was obtained. Prior Anticoagulants: The patient has                            taken previous NSAID medication, last dose was 1                            day prior to procedure. ASA Grade Assessment: II -  A patient with mild systemic disease. After                            reviewing the risks and benefits, the patient was                            deemed in satisfactory condition to undergo the                            procedure. After obtaining informed consent, the                            endoscope was passed under direct vision.                          Throughout the procedure, the patient's blood                            pressure, pulse, and oxygen saturations were                            monitored continuously. The [id number] ([id number])                            scope was introduced through the mouth, and                            advanced to the second part of duodenum. The upper                            GI endoscopy was somewhat difficult due to the                            patient's agitation. Successful completion of the                            procedure was aided by increasing the dose of                            sedation medication. The patient tolerated the                            procedure fairly well. Scope In: 1:52:58 PM Scope Out: 2:05:26 PM Total Procedure Duration: 0 hours 12 minutes 28 seconds  Findings:      The examined esophagus was normal.      Diffuse moderate inflammation characterized by congestion (edema),       erythema and friability was found in the entire examined stomach.       Biopsies were taken with a cold forceps for Helicobacter pylori testing.      The examined duodenum was normal. Biopsies for histology were taken with       a cold forceps for evaluation of celiac disease. Impression:               -                           -  MODERATE Gastritis DUE TO NSAID USE/POSSIBLE H                            PYLORI INFECTION                           - NORMOCYTIC ANEMIA DUE TO GASTRITIS, CHRONIC RENAL                            INSUFFICIENCY(CRI), AND CHRONIC DISEASE Moderate Sedation:      Moderate (conscious) sedation was administered by the endoscopy nurse       and supervised by the endoscopist. The following parameters were       monitored: oxygen saturation, heart rate, blood pressure, and response       to care. Total physician intraservice time was 36 minutes. Recommendation:           - Await pathology results.                           - Continue present  medications. AVOID ASA/NSAIDS                            INDEFINITELY. ADD OMEPRAZOLE DAILY INDEFINITELY.                           - Use Prilosec (omeprazole) 20 mg PO daily                            indefinitely.                           - Return to my office in 3 months.                           - High fiber diet.                           - Patient has a contact number available for                            emergencies. The signs and symptoms of potential                            delayed complications were discussed with the                            patient. Return to normal activities tomorrow.                            Written discharge instructions were provided to the                            patient. Procedure Code(s):        --- Professional ---  25852, Esophagogastroduodenoscopy, flexible,                            transoral; with biopsy, single or multiple                           99152, Moderate sedation services provided by the                            same physician or other qualified health care                            professional performing the diagnostic or                            therapeutic service that the sedation supports,                            requiring the presence of an independent trained                            observer to assist in the monitoring of the                            patient's level of consciousness and physiological                            status; initial 15 minutes of intraservice time,                            patient age 93 years or older                           [phone], Moderate sedation services; each additional                            15 minutes intraservice time Diagnosis Code(s):        --- Professional ---                           K29.70, Gastritis, unspecified, without bleeding                           D64.9, Anemia, unspecified CPT copyright 2016 American Medical  Association. All rights reserved. The codes documented in this report are preliminary and upon coder review may  be revised to meet current compliance requirements. Barney Drain, MD Barney Drain, MD 12/06/2016 2:26:39 PM This report has been signed electronically. Number of Addenda: 0

## 2016-12-06 NOTE — H&P (Signed)
  Primary Care Physician:  Chevis Pretty, FNP Primary Gastroenterologist:  Dr. Oneida Alar  Pre-Procedure History & Physical: HPI:  Janet Fletcher is a 81 y.o. female here for [place].  Past Medical History:  Diagnosis Date  . Allergy   . Cancer ([place])    lung  . Hyperlipidemia   . Iron deficiency anemia 12/15/2015  . Low vitamin B12 level 12/15/2015  . Osteopenia     Past Surgical History:  Procedure Laterality Date  . CATARACT EXTRACTION, BILATERAL Bilateral   . MINOR EXCISION EAR CANAL CYST Left    30+ years ago  . SMALL INTESTINE SURGERY    . VIDEO BRONCHOSCOPY WITH ENDOBRONCHIAL ULTRASOUND N/A 12/11/2015   Procedure: VIDEO BRONCHOSCOPY WITH ENDOBRONCHIAL ULTRASOUND;  Surgeon: Melrose Nakayama, MD;  Location: [facility];  Service: Thoracic;  Laterality: N/A;    Prior to Admission medications   Medication Sig Start Date End Date Taking? Authorizing Provider  naproxen sodium (ANAPROX) 220 MG tablet Take 220 mg by mouth daily.    Yes [provider]  simvastatin (ZOCOR) 40 MG tablet TAKE 1 TABLET (40 MG TOTAL) BY MOUTH AT BEDTIME. 12/05/16  Yes Hassell Done, Mary-Margaret, FNP    Allergies as of 11/07/2016 - Review Complete 11/07/2016  Allergen Reaction Noted  . Acetaminophen Other (See Comments) 10/11/2015  . Flagyl [metronidazole hcl] Other (See Comments) 08/05/2011  . Penicillins Other (See Comments) 08/05/2011    Family History  Problem Relation Age of Onset  . Cancer Mother   . Hypertension Father   . Colon cancer Neg Hx     Social History   Social History  . Marital status: Married    Spouse name: N/A  . Number of children: N/A  . Years of education: N/A   Occupational History  . Not on file.   Social History Main Topics  . Smoking status: Former Smoker    Packs/day: 0.25    Years: 50.00    Quit date: 01/08/2016  . Smokeless tobacco: Never Used  . Alcohol use No  . Drug use: No  . Sexual activity: Not on file   Other Topics  Concern  . Not on file   Social History Narrative  . No narrative on file    Review of Systems: See HPI, otherwise negative ROS   Physical Exam: There were no vitals taken for this visit. General:   Alert,  pleasant and cooperative in NAD Head:  Normocephalic and atraumatic. Neck:  Supple; Lungs:  Clear throughout to auscultation.    Heart:  Regular rate and rhythm. Abdomen:  Soft, nontender and nondistended. Normal bowel sounds, without guarding, and without rebound.   Neurologic:  Alert and  oriented x4;  grossly normal neurologically.  Impression/Plan:     SCREENING  Plan:  1. TCS TODAY. DISCUSSED PROCEDURE, BENEFITS, & RISKS: < 1% chance of medication reaction, bleeding, perforation, or rupture of spleen/liver.

## 2016-12-11 ENCOUNTER — Telehealth: Payer: Self-pay | Admitting: Gastroenterology

## 2016-12-11 ENCOUNTER — Other Ambulatory Visit (HOSPITAL_COMMUNITY): Payer: Self-pay | Admitting: Oncology

## 2016-12-11 DIAGNOSIS — D631 Anemia in chronic kidney disease: Secondary | ICD-10-CM

## 2016-12-11 DIAGNOSIS — D508 Other iron deficiency anemias: Secondary | ICD-10-CM

## 2016-12-11 DIAGNOSIS — N183 Chronic kidney disease, stage 3 (moderate): Secondary | ICD-10-CM

## 2016-12-11 MED ORDER — METRONIDAZOLE 500 MG PO TABS: 500.0000 mg | ORAL_TABLET | Freq: Three times a day (TID) | ORAL | 0 refills | Status: AC

## 2016-12-11 MED ORDER — PROMETHAZINE HCL 12.5 MG PO TABS: ORAL_TABLET | ORAL | 0 refills | Status: DC

## 2016-12-11 MED ORDER — CLARITHROMYCIN 500 MG PO TABS: 500.0000 mg | ORAL_TABLET | Freq: Two times a day (BID) | ORAL | 0 refills | Status: AC

## 2016-12-11 NOTE — Telephone Encounter (Addendum)
PLEASE CALL PT. She has H. Pylori gastritis. She has an allergy to PCN and FLAGYL CAN CAUSE NAUSEA. She needs FLAGYL 500 MG TID FOR 14 DAYS AND BIAXIN 500 MG BID WITH 3 PILLS AND OMEPRAZOLE TWICE DAILY for 14 days WHILE TAKING THE ANTIBIOTICS then once daily. The meds can cause nausea, vomiting, abdominal cramps, loose stools, black colored stools, and metallic taste in her mouth. DO NOT TAKE ZOCOR WHILE TAKING ABX.  FOLLOW UP IN 3 MOS with DR. FIELDS E30 H PYLORI GASTRITIS, ANEMIA.

## 2016-12-11 NOTE — Progress Notes (Signed)
Noted  

## 2016-12-11 NOTE — Discharge Instructions (Signed)
YOUR LOW BLOOD COUNT IS MOST LIKELY DUE TO GASTRITIS, REDUCED KIDNEY FUNCTION, AND OTHER CHRONIC MEDICAL PROBLEMS. YOU DID NOT HAVE ANY POLYPS. You HAVE internal hemorrhoids. You have gastritis MOST LIKELY DUE TO NAPROXEN.  I biopsied your stomach AND SMALL BOWEL   STOP USING NAPROXEN BECAUSE IT CAUSES GASTRITIS AND KIDNEY FAILURE. Use oxycodone 1/2 tablet every 4 hours if needed for pain.  START OMEPRAZOLE 30 minutes prior to your first meaL EVERY DAY.  AVOID ITEMS THAT TRIGGER GASTRITIS. SEE INFO BELOW.  FOLLOW A HIGH FIBER DIET. AVOID ITEMS THAT CAUSE BLOATING. SEE INFO BELOW.  YOUR BIOPSY RESULTS WILL BE AVAILABLE IN MY CHART AFTER MAY 23 AND MY OFFICE WILL CONTACT YOU IN 10-14 DAYS WITH YOUR RESULTS.   FOLLOW UP IN 3 MOS WITH DR. Azucena Dart.   ENDOSCOPY Care After Read the instructions outlined below and refer to this sheet in the next week. These discharge instructions provide you with general information on caring for yourself after you leave the hospital. While your treatment has been planned according to the most current medical practices available, unavoidable complications occasionally occur. If you have any problems or questions after discharge, call DR. Onnika Siebel, [phone].  ACTIVITY  You may resume your regular activity, but move at a slower pace for the next 24 hours.   Take frequent rest periods for the next 24 hours.   Walking will help get rid of the air and reduce the bloated feeling in your belly (abdomen).   No driving for 24 hours (because of the medicine (anesthesia) used during the test).   You may shower.   Do not sign any important legal documents or operate any machinery for 24 hours (because of the anesthesia used during the test).    NUTRITION  Drink plenty of fluids.   You may resume your normal diet as instructed by your doctor.   Begin with a light meal and progress to your normal diet. Heavy or fried foods are harder to digest and may make you  feel sick to your stomach (nauseated).   Avoid alcoholic beverages for 24 hours or as instructed.    MEDICATIONS  You may resume your normal medications.   WHAT YOU CAN EXPECT TODAY  Some feelings of bloating in the abdomen.   Passage of more gas than usual.   Spotting of blood in your stool or on the toilet paper  .  IF YOU HAD POLYPS REMOVED DURING THE ENDOSCOPY:  Eat a soft diet IF YOU HAVE NAUSEA, BLOATING, ABDOMINAL PAIN, OR VOMITING.    FINDING OUT THE RESULTS OF YOUR TEST Not all test results are available during your visit. DR. Oneida Alar WILL CALL YOU WITHIN 14 DAYS OF YOUR PROCEDUE WITH YOUR RESULTS. Do not assume everything is normal if you have not heard from DR. Normajean Nash IN TWO WEEKS, CALL HER OFFICE AT [phone].  SEEK IMMEDIATE MEDICAL ATTENTION AND CALL THE OFFICE: [phone] IF:  You have more than a spotting of blood in your stool.   Your belly is swollen (abdominal distention).   You are nauseated or vomiting.   You have a temperature over 101F.   You have abdominal pain or discomfort that is severe or gets worse throughout the day.   Gastritis  Gastritis is an inflammation (the body's way of reacting to injury and/or infection) of the stomach. It is often caused by viral or bacterial (germ) infections. It can also be caused BY ALCOHOL, ASPIRIN, BC/GOODY POWDER'S, (IBUPROFEN) MOTRIN, OR ALEVE (NAPROXEN), chemicals (  including alcohol), SPICY FOODS, and medications. This illness may be associated with generalized malaise (feeling tired, not well), UPPER ABDOMINAL STOMACH cramps, and fever. One common bacterial cause of gastritis is an organism known as H. Pylori. This can be treated with antibiotics.    High-Fiber Diet A high-fiber diet changes your normal diet to include more whole grains, legumes, fruits, and vegetables. Changes in the diet involve replacing refined carbohydrates with unrefined foods. The calorie level of the diet is essentially  unchanged. The Dietary Reference Intake (recommended amount) for adult males is 38 grams per day. For adult females, it is 25 grams per day. Pregnant and lactating women should consume 28 grams of fiber per day. Fiber is the intact part of a plant that is not broken down during digestion. Functional fiber is fiber that has been isolated from the plant to provide a beneficial effect in the body. PURPOSE  Increase stool bulk.   Ease and regulate bowel movements.   Lower cholesterol.  INDICATIONS THAT YOU NEED MORE FIBER  Constipation and hemorrhoids.   Uncomplicated diverticulosis (intestine condition) and irritable bowel syndrome.   Weight management.   As a protective measure against hardening of the arteries (atherosclerosis), diabetes, and cancer.   GUIDELINES FOR INCREASING FIBER IN THE DIET  Start adding fiber to the diet slowly. A gradual increase of about 5 more grams (2 slices of whole-wheat bread, 2 servings of most fruits or vegetables, or 1 bowl of high-fiber cereal) per day is best. Too rapid an increase in fiber may result in constipation, flatulence, and bloating.   Drink enough water and fluids to keep your urine clear or pale yellow. Water, juice, or caffeine-free drinks are recommended. Not drinking enough fluid may cause constipation.   Eat a variety of high-fiber foods rather than one type of fiber.   Try to increase your intake of fiber through using high-fiber foods rather than fiber pills or supplements that contain small amounts of fiber.   The goal is to change the types of food eaten. Do not supplement your present diet with high-fiber foods, but replace foods in your present diet.  INCLUDE A VARIETY OF FIBER SOURCES  Replace refined and processed grains with whole grains, canned fruits with fresh fruits, and incorporate other fiber sources. White rice, white breads, and most bakery goods contain little or no fiber.   Brown whole-grain rice, buckwheat oats,  and many fruits and vegetables are all good sources of fiber. These include: broccoli, Brussels sprouts, cabbage, cauliflower, beets, sweet potatoes, white potatoes (skin on), carrots, tomatoes, eggplant, squash, berries, fresh fruits, and dried fruits.   Cereals appear to be the richest source of fiber. Cereal fiber is found in whole grains and bran. Bran is the fiber-rich outer coat of cereal grain, which is largely removed in refining. In whole-grain cereals, the bran remains. In breakfast cereals, the largest amount of fiber is found in those with "bran" in their names. The fiber content is sometimes indicated on the label.   You may need to include additional fruits and vegetables each day.   In baking, for 1 cup white flour, you may use the following substitutions:   1 cup whole-wheat flour minus 2 tablespoons.   1/2 cup white flour plus 1/2 cup whole-wheat flour.   Diverticulosis Diverticulosis is a common condition that develops when small pouches (diverticula) form in the wall of the colon. The risk of diverticulosis increases with age. It happens more often in people who eat a  low-fiber diet. Most individuals with diverticulosis have no symptoms. Those individuals with symptoms usually experience belly (abdominal) pain, constipation, or loose stools (diarrhea).  HOME CARE INSTRUCTIONS  Increase the amount of fiber in your diet as directed by your caregiver or dietician. This may reduce symptoms of diverticulosis.   Drink at least 6 to 8 glasses of water each day to prevent constipation.   Try not to strain when you have a bowel movement.   Avoiding nuts and seeds to prevent complications is NOT NECESSARY. Marland Kitchen       FOODS HAVING HIGH FIBER CONTENT INCLUDE:  Fruits. Apple, peach, pear, tangerine, raisins, prunes.   Vegetables. Brussels sprouts, asparagus, broccoli, cabbage, carrot, cauliflower, romaine lettuce, spinach, summer squash, tomato, winter squash, zucchini.   Starchy  Vegetables. Baked beans, kidney beans, lima beans, split peas, lentils, potatoes (with skin).   Grains. Whole wheat bread, brown rice, bran flake cereal, plain oatmeal, white rice, shredded wheat, bran muffins.    SEEK IMMEDIATE MEDICAL CARE IF:  You develop increasing pain or severe bloating.   You have an oral temperature above 101F.   You develop vomiting or bowel movements that are bloody or black.   Hemorrhoids Hemorrhoids are dilated (enlarged) veins around the rectum. Sometimes clots will form in the veins. This makes them swollen and painful. These are called thrombosed hemorrhoids. Causes of hemorrhoids include:  Constipation.   Straining to have a bowel movement.   HEAVY LIFTING HOME CARE INSTRUCTIONS  Eat a well balanced diet and drink 6 to 8 glasses of water every day to avoid constipation. You may also use a bulk laxative.   Avoid straining to have bowel movements.   Keep anal area dry and clean.   Do not use a donut shaped pillow or sit on the toilet for long periods. This increases blood pooling and pain.   Move your bowels when your body has the urge; this will require less straining and will decrease pain and pressure.

## 2016-12-11 NOTE — Progress Notes (Signed)
Dr. Oneida Alar, please advise what for pt to take.

## 2016-12-11 NOTE — Telephone Encounter (Signed)
Reminder in epic °

## 2016-12-11 NOTE — Telephone Encounter (Signed)
CALLED PT'S DAUGHTER. Her memory is not good. WILL GET PT TO CALL ME AND WE CAN DISCUSS. Called patient TO DISCUSS ALLERGY. SHE BELIEVES SHE HAS NAUSEA WITH FLAGYL. NAUSEA CAN BE CONTROLED WITH PHENERGAN . DISCUSSED WITH PT AND DAUGHTER. WILL TRAY H PYLORI REGIMEN CONTAINING FLAGYL AND BIAXIN FOR 14 DAYS.

## 2016-12-12 ENCOUNTER — Other Ambulatory Visit (HOSPITAL_COMMUNITY): Payer: Self-pay | Admitting: Adult Health

## 2016-12-12 ENCOUNTER — Encounter (HOSPITAL_COMMUNITY): Payer: Self-pay | Admitting: Gastroenterology

## 2016-12-12 ENCOUNTER — Telehealth (HOSPITAL_COMMUNITY): Payer: Self-pay

## 2016-12-12 ENCOUNTER — Telehealth: Payer: Self-pay | Admitting: Gastroenterology

## 2016-12-12 ENCOUNTER — Telehealth: Payer: Self-pay

## 2016-12-12 NOTE — Telephone Encounter (Signed)
Left message letting patient and family know she needs IV iron and the appt is 12/20/16 at 2:30. Explained why we could not do it same day as aranesp. Call cancer center if any questions.

## 2016-12-12 NOTE — Telephone Encounter (Signed)
Pt's daughter, Ivin Booty, called to confirm the names of the meds that her mom is supposed to be taking.  She said her mom went to pick up the prescriptions and called her and said that it was not the medicine that she was told that she would be getting. I read the names and spelled the generic names of the Biaxin and Flagyl to her and told her that her mom is not to take the Zocor while she is taking the Biaxin. She thanked me and said she will call if she has further questions.

## 2016-12-12 NOTE — Telephone Encounter (Addendum)
Received fax from [facility]. Biaxin has been approved and I have faxed the approval to [place]. pts daughter- [name] is aware.

## 2016-12-12 NOTE — Telephone Encounter (Signed)
-----   Message from [facility], PA-C sent at 12/11/2016  5:00 PM EDT ----- I have placed labs for her next lab appt.  She needs 1 dose of IV iron too.  Orders are in.  Can this be done the same day as her lab and aranesp appt?  TK

## 2016-12-12 NOTE — Telephone Encounter (Signed)
I called and talked to the insurance company yesterday and we are waiting for insurance approval. Insurance reference number is [id number] and they said it could take 24-72 hours to get an answer.   I called and spoke with [name]- the pts daughter and explained it to her. She said she understood.

## 2016-12-12 NOTE — Telephone Encounter (Signed)
Pt's daughter, [name], needs to speak with JL about the 2 prescriptions called into [facility] [phone]

## 2016-12-13 NOTE — Telephone Encounter (Signed)
REVIEWED. AGREE. NO ADDITIONAL RECOMMENDATIONS. 

## 2016-12-13 NOTE — Telephone Encounter (Signed)
Another daughter of the pt's, Andrena Mews, called to go over the medications. They have the two antibiotics, omeprazole and the phenergan. I reviewed the instructions with her and she is also aware pt not to take Zocor while on the Biaxin. She expressed understanding.

## 2016-12-17 ENCOUNTER — Ambulatory Visit (HOSPITAL_COMMUNITY): Payer: Medicare HMO

## 2016-12-17 ENCOUNTER — Other Ambulatory Visit (HOSPITAL_COMMUNITY): Payer: Medicare HMO

## 2016-12-17 NOTE — Telephone Encounter (Signed)
REVIEWED-NO ADDITIONAL RECOMMENDATIONS. 

## 2016-12-20 ENCOUNTER — Encounter (HOSPITAL_COMMUNITY): Payer: Medicare HMO | Attending: Oncology

## 2016-12-20 ENCOUNTER — Encounter (HOSPITAL_COMMUNITY): Payer: Self-pay

## 2016-12-20 VITALS — BP 112/57 | HR 84 | Temp 98.4°F | Resp 18 | Wt 126.4 lb

## 2016-12-20 DIAGNOSIS — E785 Hyperlipidemia, unspecified: Secondary | ICD-10-CM | POA: Insufficient documentation

## 2016-12-20 DIAGNOSIS — Z888 Allergy status to other drugs, medicaments and biological substances status: Secondary | ICD-10-CM | POA: Insufficient documentation

## 2016-12-20 DIAGNOSIS — R5383 Other fatigue: Secondary | ICD-10-CM | POA: Insufficient documentation

## 2016-12-20 DIAGNOSIS — D649 Anemia, unspecified: Secondary | ICD-10-CM | POA: Insufficient documentation

## 2016-12-20 DIAGNOSIS — Z9221 Personal history of antineoplastic chemotherapy: Secondary | ICD-10-CM | POA: Insufficient documentation

## 2016-12-20 DIAGNOSIS — Z880 Allergy status to penicillin: Secondary | ICD-10-CM | POA: Insufficient documentation

## 2016-12-20 DIAGNOSIS — Z95828 Presence of other vascular implants and grafts: Secondary | ICD-10-CM

## 2016-12-20 DIAGNOSIS — Z79899 Other long term (current) drug therapy: Secondary | ICD-10-CM | POA: Insufficient documentation

## 2016-12-20 DIAGNOSIS — Z9889 Other specified postprocedural states: Secondary | ICD-10-CM | POA: Insufficient documentation

## 2016-12-20 DIAGNOSIS — Z8249 Family history of ischemic heart disease and other diseases of the circulatory system: Secondary | ICD-10-CM | POA: Insufficient documentation

## 2016-12-20 DIAGNOSIS — F17210 Nicotine dependence, cigarettes, uncomplicated: Secondary | ICD-10-CM | POA: Insufficient documentation

## 2016-12-20 DIAGNOSIS — E538 Deficiency of other specified B group vitamins: Secondary | ICD-10-CM

## 2016-12-20 DIAGNOSIS — C3491 Malignant neoplasm of unspecified part of right bronchus or lung: Secondary | ICD-10-CM | POA: Insufficient documentation

## 2016-12-20 DIAGNOSIS — Z5189 Encounter for other specified aftercare: Secondary | ICD-10-CM | POA: Insufficient documentation

## 2016-12-20 DIAGNOSIS — Z923 Personal history of irradiation: Secondary | ICD-10-CM | POA: Insufficient documentation

## 2016-12-20 DIAGNOSIS — Z809 Family history of malignant neoplasm, unspecified: Secondary | ICD-10-CM | POA: Insufficient documentation

## 2016-12-20 MED ORDER — SODIUM CHLORIDE 0.9 % IV SOLN
510.0000 mg | Freq: Once | INTRAVENOUS | Status: AC
  Administered 2016-12-20: 510 mg via INTRAVENOUS
  Filled 2016-12-20: qty 17

## 2016-12-20 MED ORDER — HEPARIN SOD (PORK) LOCK FLUSH 100 UNIT/ML IV SOLN
500.0000 [IU] | Freq: Once | INTRAVENOUS | Status: AC
  Administered 2016-12-20: 500 [IU] via INTRAVENOUS

## 2016-12-20 MED ORDER — HEPARIN SOD (PORK) LOCK FLUSH 100 UNIT/ML IV SOLN
INTRAVENOUS | Status: AC
  Filled 2016-12-20: qty 5

## 2016-12-20 MED ORDER — SODIUM CHLORIDE 0.9 % IV SOLN
Freq: Once | INTRAVENOUS | Status: AC
  Administered 2016-12-20: 15:00:00 via INTRAVENOUS

## 2016-12-20 NOTE — Progress Notes (Signed)
Tolerated infusion w/o adverse reaction.  Alert, in no distress.  VSS.  Discharged ambulatory in c/o family.

## 2016-12-20 NOTE — Patient Instructions (Signed)
[facility] Discharge Instructions  RECOMMENDATIONS MADE BY THE CONSULTANT AND ANY TEST RESULTS WILL BE SENT TO YOUR REFERRING PHYSICIAN.  Iron infusion today. Return as scheduled.   Thank you for choosing [facility] to provide your oncology and hematology care.  To afford each patient quality time with our provider, please arrive at least 15 minutes before your scheduled appointment time.    If you have a lab appointment with the Cancer Center please come in thru the  Main Entrance and check in at the main information desk  You need to re-schedule your appointment should you arrive 10 or more minutes late.  We strive to give you quality time with our providers, and arriving late affects you and other patients whose appointments are after yours.  Also, if you no show three or more times for appointments you may be dismissed from the clinic at the providers discretion.     Again, thank you for choosing [facility].  Our hope is that these requests will decrease the amount of time that you wait before being seen by our physicians.       _____________________________________________________________  Should you have questions after your visit to  Cancer Center, please contact our office at [phone] between the hours of 8:30 a.m. and 4:30 p.m.  Voicemails left after 4:30 p.m. will not be returned until the following business day.  For prescription refill requests, have your pharmacy contact our office.       Resources For Cancer Patients and their Caregivers ? American [employer]: Can assist with transportation, wigs, general needs, runs Look Good Feel Better.        1-888-227-6333 ? Cancer Care: Provides financial assistance, online support groups, medication/co-pay assistance.  1-800-813-HOPE (4673) ? [facility] Assists [place] cancer patients and their  families through emotional , educational and financial support.  336-427-4357 ? [place] DSS Where to apply for food stamps, Medicaid and utility assistance. 336-342-1394 ? [facility]: Transportation to medical appointments. 336-347-2287 ? Social Security Administration: May apply for disability if have a Stage IV cancer. 336-342-7796 1-800-772-1213 ? [place] Aging, Disability and Transit Services: Assists with nutrition, care and transit needs. 336-349-2343  Cancer Center Support Programs: @10RELATIVEDAYS@ > Cancer Support Group  2nd Tuesday of the month 1pm-2pm, Journey Room  > Creative Journey  3rd Tuesday of the month 1130am-1pm, Journey Room  > Look Good Feel Better  1st Wednesday of the month 10am-12 noon, Journey Room (Call American [employer] to register 1-800-395-5775)   

## 2016-12-23 ENCOUNTER — Encounter (HOSPITAL_COMMUNITY): Payer: Medicare HMO

## 2016-12-23 ENCOUNTER — Ambulatory Visit (HOSPITAL_COMMUNITY): Payer: Medicare HMO

## 2016-12-23 DIAGNOSIS — Z9221 Personal history of antineoplastic chemotherapy: Secondary | ICD-10-CM | POA: Diagnosis not present

## 2016-12-23 DIAGNOSIS — D508 Other iron deficiency anemias: Secondary | ICD-10-CM

## 2016-12-23 DIAGNOSIS — R5383 Other fatigue: Secondary | ICD-10-CM | POA: Diagnosis not present

## 2016-12-23 DIAGNOSIS — F17210 Nicotine dependence, cigarettes, uncomplicated: Secondary | ICD-10-CM | POA: Diagnosis not present

## 2016-12-23 DIAGNOSIS — Z5189 Encounter for other specified aftercare: Secondary | ICD-10-CM | POA: Diagnosis not present

## 2016-12-23 DIAGNOSIS — Z809 Family history of malignant neoplasm, unspecified: Secondary | ICD-10-CM | POA: Diagnosis not present

## 2016-12-23 DIAGNOSIS — Z8249 Family history of ischemic heart disease and other diseases of the circulatory system: Secondary | ICD-10-CM | POA: Diagnosis not present

## 2016-12-23 DIAGNOSIS — Z888 Allergy status to other drugs, medicaments and biological substances status: Secondary | ICD-10-CM | POA: Diagnosis not present

## 2016-12-23 DIAGNOSIS — Z79899 Other long term (current) drug therapy: Secondary | ICD-10-CM | POA: Diagnosis not present

## 2016-12-23 DIAGNOSIS — D631 Anemia in chronic kidney disease: Secondary | ICD-10-CM

## 2016-12-23 DIAGNOSIS — E785 Hyperlipidemia, unspecified: Secondary | ICD-10-CM | POA: Diagnosis not present

## 2016-12-23 DIAGNOSIS — D649 Anemia, unspecified: Secondary | ICD-10-CM | POA: Diagnosis not present

## 2016-12-23 DIAGNOSIS — N183 Chronic kidney disease, stage 3 (moderate): Secondary | ICD-10-CM

## 2016-12-23 DIAGNOSIS — Z880 Allergy status to penicillin: Secondary | ICD-10-CM | POA: Diagnosis not present

## 2016-12-23 DIAGNOSIS — Z923 Personal history of irradiation: Secondary | ICD-10-CM | POA: Diagnosis not present

## 2016-12-23 DIAGNOSIS — C3491 Malignant neoplasm of unspecified part of right bronchus or lung: Secondary | ICD-10-CM | POA: Diagnosis not present

## 2016-12-23 DIAGNOSIS — Z9889 Other specified postprocedural states: Secondary | ICD-10-CM | POA: Diagnosis not present

## 2016-12-23 LAB — BASIC METABOLIC PANEL
Anion gap: 9 (ref 5–15)
BUN: 19 mg/dL (ref 6–20)
CHLORIDE: 105 mmol/L (ref 101–111)
CO2: 26 mmol/L (ref 22–32)
Calcium: 8.9 mg/dL (ref 8.9–10.3)
Creatinine, Ser: 1.37 mg/dL — ABNORMAL HIGH (ref 0.44–1.00)
GFR calc Af Amer: 42 mL/min — ABNORMAL LOW (ref >60)
GFR calc non Af Amer: 36 mL/min — ABNORMAL LOW (ref >60)
GLUCOSE: 131 mg/dL — AB (ref 65–99)
POTASSIUM: 3.9 mmol/L (ref 3.5–5.1)
SODIUM: 140 mmol/L (ref 135–145)

## 2016-12-23 LAB — CBC WITH DIFFERENTIAL/PLATELET
Basophils Absolute: 0 10*3/uL (ref 0.0–0.1)
Basophils Relative: 1 %
Eosinophils Absolute: 0.1 10*3/uL (ref 0.0–0.7)
Eosinophils Relative: 3 %
HEMATOCRIT: 29.9 % — AB (ref 36.0–46.0)
HEMOGLOBIN: 9.3 g/dL — AB (ref 12.0–15.0)
LYMPHS ABS: 0.7 10*3/uL (ref 0.7–4.0)
LYMPHS PCT: 23 %
MCH: 25.8 pg — AB (ref 26.0–34.0)
MCHC: 31.1 g/dL (ref 30.0–36.0)
MCV: 83.1 fL (ref 78.0–100.0)
MONOS PCT: 8 %
Monocytes Absolute: 0.2 10*3/uL (ref 0.1–1.0)
NEUTROS ABS: 1.9 10*3/uL (ref 1.7–7.7)
NEUTROS PCT: 65 %
Platelets: 110 10*3/uL — ABNORMAL LOW (ref 150–400)
RBC: 3.6 MIL/uL — ABNORMAL LOW (ref 3.87–5.11)
RDW: 19.3 % — ABNORMAL HIGH (ref 11.5–15.5)
WBC: 2.9 10*3/uL — ABNORMAL LOW (ref 4.0–10.5)

## 2016-12-23 LAB — IRON AND TIBC
IRON: 204 ug/dL — AB (ref 28–170)
Saturation Ratios: 95 % — ABNORMAL HIGH (ref 10.4–31.8)
TIBC: 214 ug/dL — ABNORMAL LOW (ref 250–450)
UIBC: 10 ug/dL

## 2016-12-23 LAB — FERRITIN: FERRITIN: 400 ng/mL — AB (ref 11–307)

## 2016-12-25 ENCOUNTER — Ambulatory Visit (HOSPITAL_COMMUNITY)
Admission: RE | Admit: 2016-12-25 | Discharge: 2016-12-25 | Disposition: A | Discharge status: Home / Self Care | Payer: Medicare HMO | Source: Ambulatory Visit | Attending: Adult Health | Admitting: Adult Health

## 2016-12-25 ENCOUNTER — Telehealth (HOSPITAL_COMMUNITY): Payer: Self-pay | Admitting: Oncology

## 2016-12-25 DIAGNOSIS — J90 Pleural effusion, not elsewhere classified: Secondary | ICD-10-CM | POA: Insufficient documentation

## 2016-12-25 DIAGNOSIS — I700 Atherosclerosis of aorta: Secondary | ICD-10-CM | POA: Insufficient documentation

## 2016-12-25 DIAGNOSIS — C3491 Malignant neoplasm of unspecified part of right bronchus or lung: Secondary | ICD-10-CM | POA: Diagnosis not present

## 2016-12-25 DIAGNOSIS — R590 Localized enlarged lymph nodes: Secondary | ICD-10-CM | POA: Diagnosis not present

## 2016-12-25 MED ORDER — IOPAMIDOL (ISOVUE-300) INJECTION 61%
80.0000 mL | Freq: Once | INTRAVENOUS | Status: AC | PRN
  Administered 2016-12-25: 80 mL via INTRAVENOUS

## 2016-12-25 NOTE — Telephone Encounter (Signed)
Faxed expedited appeal to [facility] for [phone]

## 2016-12-26 ENCOUNTER — Encounter (HOSPITAL_COMMUNITY): Payer: Medicare HMO | Attending: Oncology | Admitting: Oncology

## 2016-12-26 ENCOUNTER — Ambulatory Visit (HOSPITAL_COMMUNITY): Payer: Medicare HMO

## 2016-12-26 ENCOUNTER — Encounter (HOSPITAL_COMMUNITY): Payer: Self-pay

## 2016-12-26 VITALS — BP 111/56 | HR 88 | Temp 97.7°F | Resp 18 | Wt 127.4 lb

## 2016-12-26 DIAGNOSIS — E538 Deficiency of other specified B group vitamins: Secondary | ICD-10-CM | POA: Diagnosis not present

## 2016-12-26 DIAGNOSIS — D649 Anemia, unspecified: Secondary | ICD-10-CM

## 2016-12-26 DIAGNOSIS — D508 Other iron deficiency anemias: Secondary | ICD-10-CM

## 2016-12-26 DIAGNOSIS — D61818 Other pancytopenia: Secondary | ICD-10-CM

## 2016-12-26 DIAGNOSIS — C3491 Malignant neoplasm of unspecified part of right bronchus or lung: Secondary | ICD-10-CM | POA: Diagnosis not present

## 2016-12-26 NOTE — Progress Notes (Signed)
Janet Fletcher, Janet Fletcher   CLINIC:  Medical Oncology/Hematology  PCP:  Chevis Pretty, [address] [place] [address] [phone]   REASON FOR VISIT:  Follow-up for limited stage small cell lung cancer AND Anemia   CURRENT THERAPY: Surveillance via NCCN Guidelines AND Aranesp injections every 2 weeks & B12 injections monthly    BRIEF ONCOLOGIC HISTORY:    Small cell carcinoma of right lung ([place])   11/28/2015 Imaging    Large anterior mediastinal mass in continuity with a R hilar mass, narrowing of SVC, abnormal densities in RUL, 9 mm spiculated nodule, nonspec. hypodensity in liver      12/05/2015 PET scan    Large hypermetabolic paratracheal mass c/w SCLC, perihilar nodular densities in RML, mild metabolic activity RUL nodule, no distant metastatic disease      12/11/2015 Procedure    Video bronch with biopsies and brushings, endobronchial ultrasound with mediastinal LN aspiration. Dr. Roxan Hockey      12/11/2015 Pathology Results    Trachea biopsy negative, FNA RUL malignant cells c/w SCLC      12/13/2015 - 03/15/2016 Chemotherapy    Cisplatin/Etoposide x 5 cycles.  Cycle #6 cancelled due to significant cytopenias      12/25/2015 Imaging    MRI brain- No intracranial parenchymal enhancing lesion. Tiny right frontal calvarial enhancing lesion. Small metastatic lesion not excluded although this may represent an incidentally detected benign process.      12/29/2015 - 02/13/2016 Radiation Therapy    XRT      02/14/2016 Treatment Plan Change    Chemotherapy deferred x 1 week      02/21/2016 Treatment Plan Change    Carboplatin dose reduced by 20% and Etoposide dose reduced by 10%.      04/24/2016 Imaging    CT CAP- Chest Impression:  1. Marked reduction in mediastinal and RIGHT hilar lymphadenopathy with no residual pathologically enlarged nodes. 2. Resolution of perihilar nodularity in the RIGHT lung which  was hypermetabolic on FDG PET. 3. Persistent nodule in the RIGHT upper lobe which is not hypermetabolic. 4. New ground-glass nodule in the RIGHT upper lobe is nonspecific and may relate to therapy.  Abdomen / Pelvis Impression:  1. No evidence of metastatic disease in the abdomen pelvis. 2. Atherosclerotic calcification of the aorta.      04/24/2016 Imaging    Bone scan- Nonspecific increased tracer localization at approximately T11 vertebral body; this is of uncertain etiology, could represent a degenerative process though metastatic disease is not excluded.      04/29/2016 -  Radiation Therapy    PCI -- 25 Gy      07/24/2016 Imaging    CT CAP- 1. There has been interval decrease in size of mediastinal lymph nodes. The index lesion within the right upper lobe is also decreased in size from previous exam. 2. New subpleural nodule is noted within the posteromedial right lower lobe. Attention on follow-up imaging advised. 3. Stable ground-glass attenuating nodule within the right lower lobe and anteromedial left upper lobe 4. Aortic atherosclerosis and coronary artery calcification      07/29/2016 Imaging    MRI t-spine: 1. Stable benign slightly enhancing sclerotic lesion in the right posterolateral aspect of the T11 vertebral body, unchanged for 5 years. This is felt to account for the subtle increased activity at T11 on the SPECT bone scan. 2. Slight degenerative disc disease with small disc protrusions at T1-2 and T2-3 and T12-L1 with  no neural impingement. 3. No evidence of metastatic disease.      09/23/2016 Imaging    CT chest- Radiation changes in the central right upper lobe/ perihilar region.  Stable small mediastinal lymph nodes, unchanged.  No findings suspicious for new/progressive metastatic disease.      12/25/2016 Imaging    CT C/A/P: IMPRESSION: 1. Continued further decrease in mediastinal lymph nodes. 2. Index nodule antero medial right upper lobe has  decreased. The new 5 mm right lower lobe subpleural nodule seen on the previous study has resolved in the interval. Ground-glass nodules in the upper lobes bilaterally are stable. 3. Interval development of interstitial and alveolar opacity in the parahilar right lung is presumably radiation related. Attention on follow-up recommended. 4. New tiny right pleural effusion with areas of apparent pleural enhancement in the posterior right costophrenic sulcus. Close attention on follow-up recommended. 5. Marked abdominal aortic atherosclerosis with likely infrarenal significant stenosis.         INTERVAL HISTORY:  Janet Fletcher 81 y.o. female returns to cancer center for routine follow-up for limited stage SCLC and anemia.    Patient presents for follow up with her daughter Janet Fletcher today. She states she's doing well. She denies any chest pain, shortness of breath, abdominal pani, focal weakness. She states her appetite is good and her weight is stable.    REVIEW OF SYSTEMS:  Review of Systems  Constitutional: Negative for appetite change (improving ), chills, fatigue and fever.  HENT:  Negative.   Eyes: Negative.  Negative for eye problems.  Respiratory: Negative.  Negative for cough and shortness of breath.   Cardiovascular: Negative.  Negative for chest pain and leg swelling.  Gastrointestinal: Negative.  Negative for abdominal pain, blood in stool, constipation, diarrhea, nausea and vomiting.  Endocrine: Negative.   Genitourinary: Negative.  Negative for dysuria, hematuria and vaginal bleeding.   Musculoskeletal: Negative.   Skin: Negative.   Neurological: Negative.  Negative for dizziness, headaches and light-headedness.  Hematological: Negative.   Psychiatric/Behavioral: Negative.  Negative for depression and sleep disturbance. The patient is not nervous/anxious.      PAST MEDICAL/SURGICAL HISTORY:  Past Medical History:  Diagnosis Date  . Allergy   . Cancer ([place])    lung    . Hyperlipidemia   . Iron deficiency anemia 12/15/2015  . Low vitamin B12 level 12/15/2015  . Osteopenia    Past Surgical History:  Procedure Laterality Date  . CATARACT EXTRACTION, BILATERAL Bilateral   . COLONOSCOPY N/A 12/06/2016   Procedure: COLONOSCOPY;  Surgeon: Danie Binder, MD;  [facility];  Service: Endoscopy;  Laterality: N/A;  1:45pm  . ESOPHAGOGASTRODUODENOSCOPY N/A 12/06/2016   Procedure: ESOPHAGOGASTRODUODENOSCOPY (EGD);  Surgeon: Danie Binder, MD;  Location[facility];  Service: Endoscopy;  Laterality: N/A;  . MINOR EXCISION EAR CANAL CYST Left    30+ years ago  . SMALL INTESTINE SURGERY    . VIDEO BRONCHOSCOPY WITH ENDOBRONCHIAL ULTRASOUND N/A 12/11/2015   Procedure: VIDEO BRONCHOSCOPY WITH ENDOBRONCHIAL ULTRASOUND;  Surgeon: Melrose Nakayama, MD;  Location: [facility];  Service: Thoracic;  Laterality: N/A;     SOCIAL HISTORY:  Social History   Social History  . Marital status: Married    Spouse name: N/A  . Number of children: N/A  . Years of education: N/A   Occupational History  . Not on file.   Social History Main Topics  . Smoking status: Former Smoker    Packs/day: 0.25    Years: 50.00  Quit date: 01/08/2016  . Smokeless tobacco: Never Used  . Alcohol use No  . Drug use: No  . Sexual activity: Not Currently   Other Topics Concern  . Not on file   Social History Narrative  . No narrative on file    FAMILY HISTORY:  Family History  Problem Relation Age of Onset  . Cancer Mother   . Hypertension Father   . Colon cancer Neg Hx     CURRENT MEDICATIONS:  Outpatient Encounter Prescriptions as of 12/26/2016  Medication Sig  . omeprazole (PRILOSEC) 20 MG capsule 1 PO 30 MINS PRIOR TO BREAKFAST.  Marland Kitchen oxyCODONE (ROXICODONE) 5 MG immediate release tablet 1/2 tab every 4 hours if needed for pain  . promethazine (PHENERGAN) 12.5 MG tablet TAKE HALF A TABLET OR ONE WHOLE TABLET MINS BEFORE FLAGYL PO TID FOR 14 DAYS TO CONTROL NASUEA   . simvastatin (ZOCOR) 40 MG tablet TAKE 1 TABLET (40 MG TOTAL) BY MOUTH AT BEDTIME.   No facility-administered encounter medications on file as of 12/26/2016.     ALLERGIES:  Allergies  Allergen Reactions  . Acetaminophen Other (See Comments)    HEART RACES AWAY/ EXTRA STRENGTH TYLENOL  . Flagyl [Metronidazole Hcl] Nausea Only  . Penicillins Other (See Comments)    Has patient had a PCN reaction causing immediate rash, facial/tongue/throat swelling, SOB or lightheadedness with hypotension: unknown Has patient had a PCN reaction causing severe rash involving mucus membranes or skin necrosis: unknown Has patient had a PCN reaction that required hospitalization unknown Has patient had a PCN reaction occurring within the last 10 years: unknown  If all of the above answers are "NO", then may proceed with Cephalosporin use.     PHYSICAL EXAM:  ECOG Performance status: 1-2 - Symptomatic; requires occasional assistance.   Vitals:   12/26/16 1116  BP: (!) 111/56  Pulse: 88  Resp: 18  Temp: 97.7 F (36.5 C)   Filed Weights   12/26/16 1116  Weight: 127 lb 6.4 oz (57.8 kg)    Physical Exam  Constitutional: She is oriented to person, place, and time and well-developed, well-nourished, and in no distress.  HENT:  Head: Normocephalic.  Mouth/Throat: Oropharynx is clear and moist. No oropharyngeal exudate.  Eyes: Conjunctivae are normal. Pupils are equal, round, and reactive to light. No scleral icterus.  Neck: Normal range of motion. Neck supple.  Cardiovascular: Normal rate, regular rhythm and normal heart sounds.   Pulmonary/Chest: Effort normal and breath sounds normal. No respiratory distress.  Abdominal: Soft. Bowel sounds are normal. There is no tenderness.  Musculoskeletal: Normal range of motion. She exhibits no edema.  Lymphadenopathy:    She has no cervical adenopathy.       Right: No supraclavicular adenopathy present.       Left: No supraclavicular adenopathy present.   Neurological: She is alert and oriented to person, place, and time. No cranial nerve deficit. Gait normal.  Skin: Skin is warm and dry. No rash noted. No pallor.  Psychiatric: Mood, memory, affect and judgment normal.  Nursing note and vitals reviewed.    LABORATORY DATA:  I have reviewed the labs as listed.  CBC    Component Value Date/Time   WBC 2.9 (L) 12/23/2016 1153   RBC 3.60 (L) 12/23/2016 1153   HGB 9.3 (L) 12/23/2016 1153   HCT 29.9 (L) 12/23/2016 1153   PLT 110 (L) 12/23/2016 1153   MCV 83.1 12/23/2016 1153   MCV 71.9 (A) 08/20/2013 1559   MCH 25.8 (  L) 12/23/2016 1153   MCHC 31.1 12/23/2016 1153   RDW 19.3 (H) 12/23/2016 1153   LYMPHSABS 0.7 12/23/2016 1153   MONOABS 0.2 12/23/2016 1153   EOSABS 0.1 12/23/2016 1153   BASOSABS 0.0 12/23/2016 1153   CMP Latest Ref Rng & Units 12/23/2016 12/03/2016 11/19/2016  Glucose 65 - 99 mg/dL 131(H) 116(H) 108(H)  BUN 6 - 20 mg/dL 19 30(H) 23(H)  Creatinine 0.44 - 1.00 mg/dL 1.37(H) 1.32(H) 1.14(H)  Sodium 135 - 145 mmol/L 140 140 140  Potassium 3.5 - 5.1 mmol/L 3.9 4.2 4.1  Chloride 101 - 111 mmol/L 105 106 108  CO2 22 - 32 mmol/L 26 28 28   Calcium 8.9 - 10.3 mg/dL 8.9 8.8(L) 8.8(L)  Total Protein 6.5 - 8.1 g/dL - 6.1(L) 6.1(L)  Total Bilirubin 0.3 - 1.2 mg/dL - 0.5 0.6  Alkaline Phos 38 - 126 U/L - 56 57  AST 15 - 41 U/L - 21 22  ALT 14 - 54 U/L - 12(L) 12(L)    PENDING LABS:    DIAGNOSTIC IMAGING:  Most recent CT chest: 09/23/16     PATHOLOGY:  FNA path: 12/11/15     ASSESSMENT & PLAN:   Limited stage small cell lung cancer:  -restaging CT chest stable with no findings suggestive of recurrence or metastatic disease, mediastinal lymphadenopathy continues to decrease in size. -Since her scans have been so stable, I will plan to repeat her next restaging scans in 6 months in December 2018. -RTC in 3 months for labs and H&P.    Pancytopenia -Discussed lab work in detail today. Her pancytopenia has been stable  however I did discuss possible bone marrow bx in the future should her pancytopenia worsen so that we may rule out MDS.  -Continue Aranesp q2 weeks, hold for hemoglobin >11 g/dL. -Continue monthly B12 injections.  -Return monthly for labs and consideration of subsequent Aranesp injections.    Dispo:  -Labs every 2 weeks with Aranesp injections.  -B12 injections monthly.  -Return to cancer center in 3 months for follow-up visit.    All questions were answered to patient's stated satisfaction. Encouraged patient to call with any new concerns or questions before her next visit to the cancer center and we can certain see her sooner, if needed.     Twana First, MD

## 2016-12-26 NOTE — Patient Instructions (Signed)
[facility] at [facility] Discharge Instructions  RECOMMENDATIONS MADE BY THE CONSULTANT AND ANY TEST RESULTS WILL BE SENT TO YOUR REFERRING PHYSICIAN.  You saw Dr. Zhou today.  Thank you for choosing  Cancer Center at [facility] to provide your oncology and hematology care.  To afford each patient quality time with our provider, please arrive at least 15 minutes before your scheduled appointment time.    If you have a lab appointment with the Cancer Center please come in thru the  Main Entrance and check in at the main information desk  You need to re-schedule your appointment should you arrive 10 or more minutes late.  We strive to give you quality time with our providers, and arriving late affects you and other patients whose appointments are after yours.  Also, if you no show three or more times for appointments you may be dismissed from the clinic at the providers discretion.     Again, thank you for choosing [facility].  Our hope is that these requests will decrease the amount of time that you wait before being seen by our physicians.       _____________________________________________________________  Should you have questions after your visit to [facility], please contact our office at [phone] between the hours of 8:30 a.m. and 4:30 p.m.  Voicemails left after 4:30 p.m. will not be returned until the following business day.  For prescription refill requests, have your pharmacy contact our office.       Resources For Cancer Patients and their Caregivers ? American Cancer Society: Can assist with transportation, wigs, general needs, runs Look Good Feel Better.        1-888-227-6333 ? Cancer Care: Provides financial assistance, online support groups, medication/co-pay assistance.  1-800-813-HOPE (4673) ? [facility] Assists [place] cancer patients and their families through  emotional , educational and financial support.  336-427-4357 ? [place] DSS Where to apply for food stamps, Medicaid and utility assistance. 336-342-1394 ? RCATS: Transportation to medical appointments. 336-347-2287 ? Social Security Administration: May apply for disability if have a Stage IV cancer. 336-342-7796 1-800-772-1213 ? [place] Aging, Disability and Transit Services: Assists with nutrition, care and transit needs. 336-349-2343  Cancer Center Support Programs: @10RELATIVEDAYS@ > Cancer Support Group  2nd Tuesday of the month 1pm-2pm, Journey Room  > Creative Journey  3rd Tuesday of the month 1130am-1pm, Journey Room  > Look Good Feel Better  1st Wednesday of the month 10am-12 noon, Journey Room (Call [employer] to register [phone])    

## 2016-12-31 ENCOUNTER — Ambulatory Visit (HOSPITAL_COMMUNITY): Payer: Medicare HMO

## 2016-12-31 ENCOUNTER — Other Ambulatory Visit (HOSPITAL_COMMUNITY): Payer: Medicare HMO

## 2017-01-06 ENCOUNTER — Encounter (HOSPITAL_BASED_OUTPATIENT_CLINIC_OR_DEPARTMENT_OTHER): Payer: Medicare HMO

## 2017-01-06 ENCOUNTER — Encounter (HOSPITAL_COMMUNITY): Payer: Medicare HMO

## 2017-01-06 VITALS — BP 109/69 | HR 70 | Temp 97.4°F | Resp 20

## 2017-01-06 DIAGNOSIS — Z9221 Personal history of antineoplastic chemotherapy: Secondary | ICD-10-CM | POA: Diagnosis not present

## 2017-01-06 DIAGNOSIS — E538 Deficiency of other specified B group vitamins: Secondary | ICD-10-CM | POA: Diagnosis not present

## 2017-01-06 DIAGNOSIS — C3491 Malignant neoplasm of unspecified part of right bronchus or lung: Secondary | ICD-10-CM | POA: Diagnosis not present

## 2017-01-06 DIAGNOSIS — D649 Anemia, unspecified: Secondary | ICD-10-CM

## 2017-01-06 DIAGNOSIS — Z5189 Encounter for other specified aftercare: Secondary | ICD-10-CM | POA: Diagnosis not present

## 2017-01-06 DIAGNOSIS — E785 Hyperlipidemia, unspecified: Secondary | ICD-10-CM | POA: Diagnosis not present

## 2017-01-06 DIAGNOSIS — F17210 Nicotine dependence, cigarettes, uncomplicated: Secondary | ICD-10-CM | POA: Diagnosis not present

## 2017-01-06 DIAGNOSIS — R5383 Other fatigue: Secondary | ICD-10-CM | POA: Diagnosis not present

## 2017-01-06 DIAGNOSIS — Z923 Personal history of irradiation: Secondary | ICD-10-CM | POA: Diagnosis not present

## 2017-01-06 DIAGNOSIS — D508 Other iron deficiency anemias: Secondary | ICD-10-CM

## 2017-01-06 DIAGNOSIS — Z9889 Other specified postprocedural states: Secondary | ICD-10-CM | POA: Diagnosis not present

## 2017-01-06 LAB — CBC WITH DIFFERENTIAL/PLATELET
Basophils Absolute: 0 10*3/uL (ref 0.0–0.1)
Basophils Relative: 1 %
Eosinophils Absolute: 0.1 10*3/uL (ref 0.0–0.7)
Eosinophils Relative: 3 %
HEMATOCRIT: 29.7 % — AB (ref 36.0–46.0)
HEMOGLOBIN: 9.3 g/dL — AB (ref 12.0–15.0)
LYMPHS PCT: 30 %
Lymphs Abs: 0.8 10*3/uL (ref 0.7–4.0)
MCH: 26.2 pg (ref 26.0–34.0)
MCHC: 31.3 g/dL (ref 30.0–36.0)
MCV: 83.7 fL (ref 78.0–100.0)
MONO ABS: 0.3 10*3/uL (ref 0.1–1.0)
MONOS PCT: 9 %
NEUTROS ABS: 1.5 10*3/uL — AB (ref 1.7–7.7)
Neutrophils Relative %: 57 %
Platelets: 93 10*3/uL — ABNORMAL LOW (ref 150–400)
RBC: 3.55 MIL/uL — ABNORMAL LOW (ref 3.87–5.11)
RDW: 20.4 % — AB (ref 11.5–15.5)
WBC: 2.7 10*3/uL — ABNORMAL LOW (ref 4.0–10.5)

## 2017-01-06 LAB — IRON AND TIBC
Iron: 44 ug/dL (ref 28–170)
Saturation Ratios: 20 % (ref 10.4–31.8)
TIBC: 221 ug/dL — ABNORMAL LOW (ref 250–450)
UIBC: 177 ug/dL

## 2017-01-06 LAB — FERRITIN: FERRITIN: 394 ng/mL — AB (ref 11–307)

## 2017-01-06 MED ORDER — CYANOCOBALAMIN 1000 MCG/ML IJ SOLN
1000.0000 ug | Freq: Once | INTRAMUSCULAR | Status: AC
  Administered 2017-01-06: 1000 ug via INTRAMUSCULAR
  Filled 2017-01-06: qty 1

## 2017-01-06 MED ORDER — DARBEPOETIN ALFA 100 MCG/0.5ML IJ SOSY
100.0000 ug | PREFILLED_SYRINGE | Freq: Once | INTRAMUSCULAR | Status: AC
  Administered 2017-01-06: 100 ug via SUBCUTANEOUS
  Filled 2017-01-06: qty 0.5

## 2017-01-06 NOTE — Progress Notes (Signed)
Janet Fletcher presents today for injection per MD orders. Aranesp 129mcg administered SQ in left Abdomen. Administration without incident. Patient tolerated well.  Janet Fletcher presents today for injection per MD orders. B12 1058mcg administered IM in left Upper Arm. Administration without incident. Patient tolerated well.

## 2017-01-08 ENCOUNTER — Ambulatory Visit: Payer: Medicare HMO | Admitting: Nurse Practitioner

## 2017-01-13 ENCOUNTER — Other Ambulatory Visit (HOSPITAL_COMMUNITY): Payer: Self-pay | Admitting: *Deleted

## 2017-01-13 DIAGNOSIS — C3491 Malignant neoplasm of unspecified part of right bronchus or lung: Secondary | ICD-10-CM

## 2017-01-14 ENCOUNTER — Encounter: Payer: Self-pay | Admitting: Gastroenterology

## 2017-01-20 ENCOUNTER — Encounter (HOSPITAL_COMMUNITY): Payer: Medicare HMO | Attending: Oncology

## 2017-01-20 ENCOUNTER — Encounter (HOSPITAL_BASED_OUTPATIENT_CLINIC_OR_DEPARTMENT_OTHER): Payer: Medicare HMO

## 2017-01-20 VITALS — BP 108/68 | HR 84 | Temp 97.6°F | Resp 18 | Wt 128.9 lb

## 2017-01-20 DIAGNOSIS — D649 Anemia, unspecified: Secondary | ICD-10-CM

## 2017-01-20 DIAGNOSIS — E538 Deficiency of other specified B group vitamins: Secondary | ICD-10-CM

## 2017-01-20 DIAGNOSIS — C3491 Malignant neoplasm of unspecified part of right bronchus or lung: Secondary | ICD-10-CM | POA: Insufficient documentation

## 2017-01-20 LAB — COMPREHENSIVE METABOLIC PANEL
ALT: 16 U/L (ref 14–54)
AST: 24 U/L (ref 15–41)
Albumin: 3.4 g/dL — ABNORMAL LOW (ref 3.5–5.0)
Alkaline Phosphatase: 58 U/L (ref 38–126)
Anion gap: 6 (ref 5–15)
BUN: 23 mg/dL — AB (ref 6–20)
CALCIUM: 8.8 mg/dL — AB (ref 8.9–10.3)
CHLORIDE: 103 mmol/L (ref 101–111)
CO2: 28 mmol/L (ref 22–32)
CREATININE: 1.16 mg/dL — AB (ref 0.44–1.00)
GFR calc Af Amer: 51 mL/min — ABNORMAL LOW (ref >60)
GFR, EST NON AFRICAN AMERICAN: 44 mL/min — AB (ref >60)
Glucose, Bld: 107 mg/dL — ABNORMAL HIGH (ref 65–99)
Potassium: 3.7 mmol/L (ref 3.5–5.1)
Sodium: 137 mmol/L (ref 135–145)
Total Bilirubin: 0.7 mg/dL (ref 0.3–1.2)
Total Protein: 6.2 g/dL — ABNORMAL LOW (ref 6.5–8.1)

## 2017-01-20 LAB — CBC WITH DIFFERENTIAL/PLATELET
Basophils Absolute: 0 10*3/uL (ref 0.0–0.1)
Basophils Relative: 0 %
EOS ABS: 0.2 10*3/uL (ref 0.0–0.7)
Eosinophils Relative: 5 %
HEMATOCRIT: 32.3 % — AB (ref 36.0–46.0)
Hemoglobin: 10 g/dL — ABNORMAL LOW (ref 12.0–15.0)
LYMPHS ABS: 0.9 10*3/uL (ref 0.7–4.0)
LYMPHS PCT: 29 %
MCH: 26.2 pg (ref 26.0–34.0)
MCHC: 31 g/dL (ref 30.0–36.0)
MCV: 84.6 fL (ref 78.0–100.0)
MONOS PCT: 9 %
Monocytes Absolute: 0.3 10*3/uL (ref 0.1–1.0)
NEUTROS ABS: 1.7 10*3/uL (ref 1.7–7.7)
Neutrophils Relative %: 56 %
Platelets: 76 10*3/uL — ABNORMAL LOW (ref 150–400)
RBC: 3.82 MIL/uL — AB (ref 3.87–5.11)
RDW: 21 % — ABNORMAL HIGH (ref 11.5–15.5)
WBC: 3 10*3/uL — AB (ref 4.0–10.5)

## 2017-01-20 MED ORDER — DARBEPOETIN ALFA 100 MCG/0.5ML IJ SOSY
PREFILLED_SYRINGE | INTRAMUSCULAR | Status: AC
  Filled 2017-01-20: qty 0.5

## 2017-01-20 MED ORDER — DARBEPOETIN ALFA 100 MCG/0.5ML IJ SOSY
100.0000 ug | PREFILLED_SYRINGE | Freq: Once | INTRAMUSCULAR | Status: AC
  Administered 2017-01-20: 100 ug via SUBCUTANEOUS

## 2017-01-20 NOTE — Progress Notes (Signed)
[name] presents today for injection per MD orders. Aranesp 100 mcg administered SQ in left Abdomen. Administration without incident. Patient tolerated well. Stable and ambulatory on discharge home with daughter.

## 2017-01-20 NOTE — Patient Instructions (Signed)
[place] at [facility] Discharge Instructions  RECOMMENDATIONS MADE BY THE CONSULTANT AND ANY TEST RESULTS WILL BE SENT TO YOUR REFERRING PHYSICIAN.  Hemoglobin 10.0 today. Aranesp 100 mcg injection given as ordered. Return as scheduled.  Thank you for choosing [place] at [facility] to provide your oncology and hematology care.  To afford each patient quality time with our provider, please arrive at least 15 minutes before your scheduled appointment time.    If you have a lab appointment with the [facility] please come in thru the  Main Entrance and check in at the main information desk  You need to re-schedule your appointment should you arrive 10 or more minutes late.  We strive to give you quality time with our providers, and arriving late affects you and other patients whose appointments are after yours.  Also, if you no show three or more times for appointments you may be dismissed from the clinic at the providers discretion.     Again, thank you for choosing [facility].  Our hope is that these requests will decrease the amount of time that you wait before being seen by our physicians.       _____________________________________________________________  Should you have questions after your visit to [facility], please contact our office at [phone] between the hours of 8:30 a.m. and 4:30 p.m.  Voicemails left after 4:30 p.m. will not be returned until the following business day.  For prescription refill requests, have your pharmacy contact our office.       Resources For Cancer Patients and their Caregivers ? American Cancer Society: Can assist with transportation, wigs, general needs, runs Look Good Feel Better.        516-648-2775 ? Cancer Care: Provides financial assistance, online support groups, medication/co-pay assistance.  1-800-813-HOPE (214) 353-0058) ? [facility] Assists [place] cancer patients and their families through emotional , educational and financial support.  254-693-9563 ? [place] DSS Where to apply for food stamps, Medicaid and utility assistance. 413-755-4879 ? RCATS: Transportation to medical appointments. 346-265-8196 ? Social Security Administration: May apply for disability if have a Stage IV cancer. 660-703-3404 (321)526-1910 ? [employer], Disability and Transit Services: Assists with nutrition, care and transit needs. [place] Support Programs: @10RELATIVEDAYS @ > Cancer Support Group  2nd Tuesday of the month 1pm-2pm, Journey Room  > Creative Journey  3rd Tuesday of the month 1130am-1pm, Journey Room  > Look Good Feel Better  1st Wednesday of the month 10am-12 noon, Journey Room (Call [place] to register [phone])

## 2017-02-03 ENCOUNTER — Encounter (HOSPITAL_COMMUNITY): Payer: Medicare HMO

## 2017-02-03 ENCOUNTER — Encounter (HOSPITAL_COMMUNITY): Payer: Self-pay

## 2017-02-03 ENCOUNTER — Encounter (HOSPITAL_BASED_OUTPATIENT_CLINIC_OR_DEPARTMENT_OTHER): Payer: Medicare HMO

## 2017-02-03 VITALS — BP 111/60 | HR 80 | Temp 97.4°F | Resp 18

## 2017-02-03 DIAGNOSIS — C3491 Malignant neoplasm of unspecified part of right bronchus or lung: Secondary | ICD-10-CM

## 2017-02-03 DIAGNOSIS — D649 Anemia, unspecified: Secondary | ICD-10-CM

## 2017-02-03 DIAGNOSIS — E538 Deficiency of other specified B group vitamins: Secondary | ICD-10-CM | POA: Diagnosis not present

## 2017-02-03 LAB — CBC WITH DIFFERENTIAL/PLATELET
BASOS PCT: 1 %
Basophils Absolute: 0 10*3/uL (ref 0.0–0.1)
EOS PCT: 3 %
Eosinophils Absolute: 0.1 10*3/uL (ref 0.0–0.7)
HEMATOCRIT: 31.9 % — AB (ref 36.0–46.0)
Hemoglobin: 10 g/dL — ABNORMAL LOW (ref 12.0–15.0)
LYMPHS ABS: 0.8 10*3/uL (ref 0.7–4.0)
Lymphocytes Relative: 27 %
MCH: 26.3 pg (ref 26.0–34.0)
MCHC: 31.3 g/dL (ref 30.0–36.0)
MCV: 83.9 fL (ref 78.0–100.0)
Monocytes Absolute: 0.2 10*3/uL (ref 0.1–1.0)
Monocytes Relative: 8 %
Neutro Abs: 1.8 10*3/uL (ref 1.7–7.7)
Neutrophils Relative %: 61 %
Platelets: 93 10*3/uL — ABNORMAL LOW (ref 150–400)
RBC: 3.8 MIL/uL — ABNORMAL LOW (ref 3.87–5.11)
RDW: 21.5 % — AB (ref 11.5–15.5)
WBC: 2.9 10*3/uL — ABNORMAL LOW (ref 4.0–10.5)

## 2017-02-03 LAB — COMPREHENSIVE METABOLIC PANEL
ALT: 11 U/L — AB (ref 14–54)
AST: 20 U/L (ref 15–41)
Albumin: 3.5 g/dL (ref 3.5–5.0)
Alkaline Phosphatase: 55 U/L (ref 38–126)
Anion gap: 6 (ref 5–15)
BILIRUBIN TOTAL: 0.6 mg/dL (ref 0.3–1.2)
BUN: 25 mg/dL — ABNORMAL HIGH (ref 6–20)
CHLORIDE: 106 mmol/L (ref 101–111)
CO2: 28 mmol/L (ref 22–32)
CREATININE: 1.26 mg/dL — AB (ref 0.44–1.00)
Calcium: 9.1 mg/dL (ref 8.9–10.3)
GFR, EST AFRICAN AMERICAN: 46 mL/min — AB (ref >60)
GFR, EST NON AFRICAN AMERICAN: 40 mL/min — AB (ref >60)
Glucose, Bld: 113 mg/dL — ABNORMAL HIGH (ref 65–99)
POTASSIUM: 4 mmol/L (ref 3.5–5.1)
Sodium: 140 mmol/L (ref 135–145)
TOTAL PROTEIN: 6.5 g/dL (ref 6.5–8.1)

## 2017-02-03 MED ORDER — DARBEPOETIN ALFA 100 MCG/0.5ML IJ SOSY
100.0000 ug | PREFILLED_SYRINGE | Freq: Once | INTRAMUSCULAR | Status: AC
  Administered 2017-02-03: 100 ug via SUBCUTANEOUS
  Filled 2017-02-03: qty 0.5

## 2017-02-03 MED ORDER — CYANOCOBALAMIN 1000 MCG/ML IJ SOLN
1000.0000 ug | Freq: Once | INTRAMUSCULAR | Status: AC
  Administered 2017-02-03: 1000 ug via INTRAMUSCULAR
  Filled 2017-02-03: qty 1

## 2017-02-03 NOTE — Progress Notes (Signed)
Janet Fletcher presents today for injection per the provider's orders.  Aranesp and B12 administrations without incident; see MAR for injection details.  Patient tolerated procedure well and without incident.  No questions or complaints noted at this time.  Discharged ambulatory in c/o family.

## 2017-02-12 DIAGNOSIS — M79601 Pain in right arm: Secondary | ICD-10-CM | POA: Diagnosis not present

## 2017-02-12 DIAGNOSIS — M5135 Other intervertebral disc degeneration, thoracolumbar region: Secondary | ICD-10-CM | POA: Diagnosis not present

## 2017-02-12 DIAGNOSIS — Z08 Encounter for follow-up examination after completed treatment for malignant neoplasm: Secondary | ICD-10-CM | POA: Diagnosis not present

## 2017-02-12 DIAGNOSIS — M5125 Other intervertebral disc displacement, thoracolumbar region: Secondary | ICD-10-CM | POA: Diagnosis not present

## 2017-02-12 DIAGNOSIS — M899 Disorder of bone, unspecified: Secondary | ICD-10-CM | POA: Diagnosis not present

## 2017-02-12 DIAGNOSIS — Z923 Personal history of irradiation: Secondary | ICD-10-CM | POA: Diagnosis not present

## 2017-02-12 DIAGNOSIS — M5134 Other intervertebral disc degeneration, thoracic region: Secondary | ICD-10-CM | POA: Diagnosis not present

## 2017-02-12 DIAGNOSIS — M19041 Primary osteoarthritis, right hand: Secondary | ICD-10-CM | POA: Diagnosis not present

## 2017-02-12 DIAGNOSIS — Z85118 Personal history of other malignant neoplasm of bronchus and lung: Secondary | ICD-10-CM | POA: Diagnosis not present

## 2017-02-12 DIAGNOSIS — M5124 Other intervertebral disc displacement, thoracic region: Secondary | ICD-10-CM | POA: Diagnosis not present

## 2017-02-12 DIAGNOSIS — C3411 Malignant neoplasm of upper lobe, right bronchus or lung: Secondary | ICD-10-CM | POA: Diagnosis not present

## 2017-02-17 ENCOUNTER — Encounter (HOSPITAL_COMMUNITY): Payer: Medicare HMO

## 2017-02-17 ENCOUNTER — Encounter (HOSPITAL_BASED_OUTPATIENT_CLINIC_OR_DEPARTMENT_OTHER): Payer: Medicare HMO

## 2017-02-17 ENCOUNTER — Encounter (HOSPITAL_COMMUNITY): Payer: Self-pay

## 2017-02-17 VITALS — BP 122/65 | HR 88 | Temp 98.1°F | Resp 18

## 2017-02-17 DIAGNOSIS — C3491 Malignant neoplasm of unspecified part of right bronchus or lung: Secondary | ICD-10-CM

## 2017-02-17 DIAGNOSIS — E538 Deficiency of other specified B group vitamins: Secondary | ICD-10-CM

## 2017-02-17 DIAGNOSIS — D649 Anemia, unspecified: Secondary | ICD-10-CM

## 2017-02-17 LAB — COMPREHENSIVE METABOLIC PANEL
ALBUMIN: 3.6 g/dL (ref 3.5–5.0)
ALT: 9 U/L — ABNORMAL LOW (ref 14–54)
ANION GAP: 6 (ref 5–15)
AST: 19 U/L (ref 15–41)
Alkaline Phosphatase: 59 U/L (ref 38–126)
BUN: 28 mg/dL — ABNORMAL HIGH (ref 6–20)
CO2: 29 mmol/L (ref 22–32)
Calcium: 9.1 mg/dL (ref 8.9–10.3)
Chloride: 104 mmol/L (ref 101–111)
Creatinine, Ser: 1.28 mg/dL — ABNORMAL HIGH (ref 0.44–1.00)
GFR calc non Af Amer: 39 mL/min — ABNORMAL LOW (ref >60)
GFR, EST AFRICAN AMERICAN: 45 mL/min — AB (ref >60)
GLUCOSE: 106 mg/dL — AB (ref 65–99)
POTASSIUM: 3.8 mmol/L (ref 3.5–5.1)
SODIUM: 139 mmol/L (ref 135–145)
Total Bilirubin: 0.7 mg/dL (ref 0.3–1.2)
Total Protein: 6.5 g/dL (ref 6.5–8.1)

## 2017-02-17 LAB — CBC WITH DIFFERENTIAL/PLATELET
BASOS ABS: 0 10*3/uL (ref 0.0–0.1)
BASOS PCT: 1 %
EOS ABS: 0.1 10*3/uL (ref 0.0–0.7)
Eosinophils Relative: 3 %
HEMATOCRIT: 33.4 % — AB (ref 36.0–46.0)
HEMOGLOBIN: 10.5 g/dL — AB (ref 12.0–15.0)
Lymphocytes Relative: 28 %
Lymphs Abs: 0.9 10*3/uL (ref 0.7–4.0)
MCH: 26.5 pg (ref 26.0–34.0)
MCHC: 31.4 g/dL (ref 30.0–36.0)
MCV: 84.3 fL (ref 78.0–100.0)
MONO ABS: 0.3 10*3/uL (ref 0.1–1.0)
Monocytes Relative: 9 %
NEUTROS ABS: 2 10*3/uL (ref 1.7–7.7)
NEUTROS PCT: 59 %
PLATELETS: 90 10*3/uL — AB (ref 150–400)
RBC: 3.96 MIL/uL (ref 3.87–5.11)
RDW: 21 % — ABNORMAL HIGH (ref 11.5–15.5)
WBC: 3.4 10*3/uL — ABNORMAL LOW (ref 4.0–10.5)

## 2017-02-17 LAB — IRON AND TIBC
Iron: 27 ug/dL — ABNORMAL LOW (ref 28–170)
SATURATION RATIOS: 10 % — AB (ref 10.4–31.8)
TIBC: 265 ug/dL (ref 250–450)
UIBC: 238 ug/dL

## 2017-02-17 LAB — FERRITIN: Ferritin: 89 ng/mL (ref 11–307)

## 2017-02-17 MED ORDER — DARBEPOETIN ALFA 100 MCG/0.5ML IJ SOSY
PREFILLED_SYRINGE | INTRAMUSCULAR | Status: AC
  Filled 2017-02-17: qty 0.5

## 2017-02-17 MED ORDER — DARBEPOETIN ALFA 100 MCG/0.5ML IJ SOSY
100.0000 ug | PREFILLED_SYRINGE | Freq: Once | INTRAMUSCULAR | Status: AC
  Administered 2017-02-17: 100 ug via SUBCUTANEOUS

## 2017-02-17 NOTE — Progress Notes (Signed)
Janet Fletcher presents today for injection per MD orders. Aranesp 100 mcg administered SQ in left lower abdomen. Administration without incident. Patient tolerated well.  Patient discharged ambulatory and in stable condition from clinic with daughter. Follow up as scheduled.

## 2017-02-17 NOTE — Patient Instructions (Signed)
[place] at [facility] Discharge Instructions  RECOMMENDATIONS MADE BY THE CONSULTANT AND ANY TEST RESULTS WILL BE SENT TO YOUR REFERRING PHYSICIAN.  You received your Aranesp injection. Follow up as scheduled.  Thank you for choosing [place] at [facility] to provide your oncology and hematology care.  To afford each patient quality time with our provider, please arrive at least 15 minutes before your scheduled appointment time.    If you have a lab appointment with the [facility] please come in thru the  Main Entrance and check in at the main information desk  You need to re-schedule your appointment should you arrive 10 or more minutes late.  We strive to give you quality time with our providers, and arriving late affects you and other patients whose appointments are after yours.  Also, if you no show three or more times for appointments you may be dismissed from the clinic at the providers discretion.     Again, thank you for choosing [facility].  Our hope is that these requests will decrease the amount of time that you wait before being seen by our physicians.       _____________________________________________________________  Should you have questions after your visit to [facility], please contact our office at [phone] between the hours of 8:30 a.m. and 4:30 p.m.  Voicemails left after 4:30 p.m. will not be returned until the following business day.  For prescription refill requests, have your pharmacy contact our office.       Resources For Cancer Patients and their Caregivers ? American Cancer Society: Can assist with transportation, wigs, general needs, runs Look Good Feel Better.        (516)691-1604 ? Cancer Care: Provides financial assistance, online support groups, medication/co-pay assistance.  1-800-813-HOPE 819-637-0516) ? [facility] Assists [place] cancer  patients and their families through emotional , educational and financial support.  (510) 593-3725 ? [place] DSS Where to apply for food stamps, Medicaid and utility assistance. 9021657898 ? RCATS: Transportation to medical appointments. 8724601078 ? Social Security Administration: May apply for disability if have a Stage IV cancer. 336-285-0487 4240632081 ? [employer], Disability and Transit Services: Assists with nutrition, care and transit needs. [place] Support Programs: @10RELATIVEDAYS @ > Cancer Support Group  2nd Tuesday of the month 1pm-2pm, Journey Room  > Creative Journey  3rd Tuesday of the month 1130am-1pm, Journey Room  > Look Good Feel Better  1st Wednesday of the month 10am-12 noon, Journey Room (Call [place] to register [phone])

## 2017-02-18 ENCOUNTER — Other Ambulatory Visit (HOSPITAL_COMMUNITY): Payer: Self-pay | Admitting: Oncology

## 2017-02-25 ENCOUNTER — Encounter (HOSPITAL_COMMUNITY): Payer: Medicare HMO | Attending: Oncology

## 2017-02-25 ENCOUNTER — Encounter (HOSPITAL_COMMUNITY): Payer: Self-pay

## 2017-02-25 VITALS — BP 126/72 | HR 71 | Temp 98.6°F | Resp 16

## 2017-02-25 DIAGNOSIS — Z8249 Family history of ischemic heart disease and other diseases of the circulatory system: Secondary | ICD-10-CM | POA: Insufficient documentation

## 2017-02-25 DIAGNOSIS — Z9889 Other specified postprocedural states: Secondary | ICD-10-CM | POA: Insufficient documentation

## 2017-02-25 DIAGNOSIS — D649 Anemia, unspecified: Secondary | ICD-10-CM | POA: Diagnosis not present

## 2017-02-25 DIAGNOSIS — Z95828 Presence of other vascular implants and grafts: Secondary | ICD-10-CM

## 2017-02-25 DIAGNOSIS — Z923 Personal history of irradiation: Secondary | ICD-10-CM | POA: Insufficient documentation

## 2017-02-25 DIAGNOSIS — Z880 Allergy status to penicillin: Secondary | ICD-10-CM | POA: Insufficient documentation

## 2017-02-25 DIAGNOSIS — Z888 Allergy status to other drugs, medicaments and biological substances status: Secondary | ICD-10-CM | POA: Insufficient documentation

## 2017-02-25 DIAGNOSIS — E785 Hyperlipidemia, unspecified: Secondary | ICD-10-CM | POA: Insufficient documentation

## 2017-02-25 DIAGNOSIS — R5383 Other fatigue: Secondary | ICD-10-CM | POA: Insufficient documentation

## 2017-02-25 DIAGNOSIS — Z79899 Other long term (current) drug therapy: Secondary | ICD-10-CM | POA: Insufficient documentation

## 2017-02-25 DIAGNOSIS — Z5189 Encounter for other specified aftercare: Secondary | ICD-10-CM | POA: Insufficient documentation

## 2017-02-25 DIAGNOSIS — E538 Deficiency of other specified B group vitamins: Secondary | ICD-10-CM

## 2017-02-25 DIAGNOSIS — F17210 Nicotine dependence, cigarettes, uncomplicated: Secondary | ICD-10-CM | POA: Insufficient documentation

## 2017-02-25 DIAGNOSIS — Z9221 Personal history of antineoplastic chemotherapy: Secondary | ICD-10-CM | POA: Insufficient documentation

## 2017-02-25 DIAGNOSIS — Z809 Family history of malignant neoplasm, unspecified: Secondary | ICD-10-CM | POA: Insufficient documentation

## 2017-02-25 DIAGNOSIS — C3491 Malignant neoplasm of unspecified part of right bronchus or lung: Secondary | ICD-10-CM | POA: Insufficient documentation

## 2017-02-25 MED ORDER — SODIUM CHLORIDE 0.9 % IV SOLN
Freq: Once | INTRAVENOUS | Status: AC
  Administered 2017-02-25: 14:00:00 via INTRAVENOUS

## 2017-02-25 MED ORDER — SODIUM CHLORIDE 0.9% FLUSH
10.0000 mL | INTRAVENOUS | Status: DC | PRN
  Administered 2017-02-25: 10 mL via INTRAVENOUS
  Filled 2017-02-25: qty 10

## 2017-02-25 MED ORDER — SODIUM CHLORIDE 0.9 % IV SOLN
125.0000 mg | Freq: Once | INTRAVENOUS | Status: AC
  Administered 2017-02-25: 125 mg via INTRAVENOUS
  Filled 2017-02-25: qty 10

## 2017-02-25 MED ORDER — HEPARIN SOD (PORK) LOCK FLUSH 100 UNIT/ML IV SOLN
INTRAVENOUS | Status: AC
  Filled 2017-02-25: qty 5

## 2017-02-25 MED ORDER — HEPARIN SOD (PORK) LOCK FLUSH 100 UNIT/ML IV SOLN
500.0000 [IU] | Freq: Once | INTRAVENOUS | Status: AC
  Administered 2017-02-25: 500 [IU] via INTRAVENOUS

## 2017-02-25 NOTE — Progress Notes (Signed)
Patient tolerated iron infusion today without incidence. Patient discharged ambulatory and in stable condition with family. Patient to follow up as scheduled.

## 2017-02-25 NOTE — Patient Instructions (Signed)
[facility] at [facility] Discharge Instructions  RECOMMENDATIONS MADE BY THE CONSULTANT AND ANY TEST RESULTS WILL BE SENT TO YOUR REFERRING PHYSICIAN.  You received your iron infusion today. Follow up as scheduled.  Thank you for choosing [facility] at [facility] to provide your oncology and hematology care.  To afford each patient quality time with our provider, please arrive at least 15 minutes before your scheduled appointment time.    If you have a lab appointment with the Cancer Center please come in thru the  Main Entrance and check in at the main information desk  You need to re-schedule your appointment should you arrive 10 or more minutes late.  We strive to give you quality time with our providers, and arriving late affects you and other patients whose appointments are after yours.  Also, if you no show three or more times for appointments you may be dismissed from the clinic at the providers discretion.     Again, thank you for choosing [facility].  Our hope is that these requests will decrease the amount of time that you wait before being seen by our physicians.       _____________________________________________________________  Should you have questions after your visit to  Cancer Center, please contact our office at [phone] between the hours of 8:30 a.m. and 4:30 p.m.  Voicemails left after 4:30 p.m. will not be returned until the following business day.  For prescription refill requests, have your pharmacy contact our office.       Resources For Cancer Patients and their Caregivers ? American Cancer Society: Can assist with transportation, wigs, general needs, runs Look Good Feel Better.        1-888-227-6333 ? Cancer Care: Provides financial assistance, online support groups, medication/co-pay assistance.  1-800-813-HOPE (4673) ? [facility] Assists [place]  cancer patients and their families through emotional , educational and financial support.  336-427-4357 ? [place] DSS Where to apply for food stamps, Medicaid and utility assistance. 336-342-1394 ? RCATS: Transportation to medical appointments. 336-347-2287 ? Social Security Administration: May apply for disability if have a Stage IV cancer. 336-342-7796 1-800-772-1213 ? [place] Aging, Disability and Transit Services: Assists with nutrition, care and transit needs. 336-349-2343  Cancer Center Support Programs: @[email or web address]@ > Cancer Support Group  2nd Tuesday of the month 1pm-2pm, Journey Room  > Creative Journey  3rd Tuesday of the month 1130am-1pm, Journey Room  > Look Good Feel Better  1st Wednesday of the month 10am-12 noon, Journey Room (Call American Cancer Society to register 1-800-395-5775)    

## 2017-02-27 ENCOUNTER — Telehealth: Payer: Self-pay | Admitting: Nurse Practitioner

## 2017-02-28 NOTE — Telephone Encounter (Signed)
Family concerned about patient memory- she had radiation for brain cancer and has completed course. They were told that this may affect her memory- Lately she has been forgetting things, lots of repeating herself. Patients herself thinks she is fine but family is seeing things. I ask daughter to make her an appointment for mini mental exam.

## 2017-03-03 ENCOUNTER — Encounter (HOSPITAL_COMMUNITY): Payer: Medicare HMO

## 2017-03-03 ENCOUNTER — Encounter (HOSPITAL_BASED_OUTPATIENT_CLINIC_OR_DEPARTMENT_OTHER): Payer: Medicare HMO

## 2017-03-03 ENCOUNTER — Encounter (HOSPITAL_COMMUNITY): Payer: Self-pay

## 2017-03-03 VITALS — BP 113/63 | HR 79 | Temp 98.0°F | Resp 18

## 2017-03-03 DIAGNOSIS — Z8249 Family history of ischemic heart disease and other diseases of the circulatory system: Secondary | ICD-10-CM | POA: Diagnosis not present

## 2017-03-03 DIAGNOSIS — R5383 Other fatigue: Secondary | ICD-10-CM | POA: Diagnosis not present

## 2017-03-03 DIAGNOSIS — D649 Anemia, unspecified: Secondary | ICD-10-CM

## 2017-03-03 DIAGNOSIS — Z79899 Other long term (current) drug therapy: Secondary | ICD-10-CM | POA: Diagnosis not present

## 2017-03-03 DIAGNOSIS — E538 Deficiency of other specified B group vitamins: Secondary | ICD-10-CM

## 2017-03-03 DIAGNOSIS — Z888 Allergy status to other drugs, medicaments and biological substances status: Secondary | ICD-10-CM | POA: Diagnosis not present

## 2017-03-03 DIAGNOSIS — Z9221 Personal history of antineoplastic chemotherapy: Secondary | ICD-10-CM | POA: Diagnosis not present

## 2017-03-03 DIAGNOSIS — F17210 Nicotine dependence, cigarettes, uncomplicated: Secondary | ICD-10-CM | POA: Diagnosis not present

## 2017-03-03 DIAGNOSIS — Z9889 Other specified postprocedural states: Secondary | ICD-10-CM | POA: Diagnosis not present

## 2017-03-03 DIAGNOSIS — Z923 Personal history of irradiation: Secondary | ICD-10-CM | POA: Diagnosis not present

## 2017-03-03 DIAGNOSIS — Z809 Family history of malignant neoplasm, unspecified: Secondary | ICD-10-CM | POA: Diagnosis not present

## 2017-03-03 DIAGNOSIS — C3491 Malignant neoplasm of unspecified part of right bronchus or lung: Secondary | ICD-10-CM

## 2017-03-03 DIAGNOSIS — Z5189 Encounter for other specified aftercare: Secondary | ICD-10-CM | POA: Diagnosis not present

## 2017-03-03 DIAGNOSIS — Z880 Allergy status to penicillin: Secondary | ICD-10-CM | POA: Diagnosis not present

## 2017-03-03 DIAGNOSIS — E785 Hyperlipidemia, unspecified: Secondary | ICD-10-CM | POA: Diagnosis not present

## 2017-03-03 LAB — COMPREHENSIVE METABOLIC PANEL
ALBUMIN: 3.6 g/dL (ref 3.5–5.0)
ALK PHOS: 58 U/L (ref 38–126)
ALT: 11 U/L — ABNORMAL LOW (ref 14–54)
ANION GAP: 8 (ref 5–15)
AST: 21 U/L (ref 15–41)
BILIRUBIN TOTAL: 0.6 mg/dL (ref 0.3–1.2)
BUN: 27 mg/dL — ABNORMAL HIGH (ref 6–20)
CALCIUM: 9.1 mg/dL (ref 8.9–10.3)
CO2: 28 mmol/L (ref 22–32)
Chloride: 103 mmol/L (ref 101–111)
Creatinine, Ser: 1.24 mg/dL — ABNORMAL HIGH (ref 0.44–1.00)
GFR calc Af Amer: 47 mL/min — ABNORMAL LOW (ref >60)
GFR calc non Af Amer: 41 mL/min — ABNORMAL LOW (ref >60)
GLUCOSE: 118 mg/dL — AB (ref 65–99)
Potassium: 4.2 mmol/L (ref 3.5–5.1)
Sodium: 139 mmol/L (ref 135–145)
TOTAL PROTEIN: 6.7 g/dL (ref 6.5–8.1)

## 2017-03-03 LAB — CBC WITH DIFFERENTIAL/PLATELET
BASOS ABS: 0 10*3/uL (ref 0.0–0.1)
Basophils Relative: 1 %
Eosinophils Absolute: 0.1 10*3/uL (ref 0.0–0.7)
Eosinophils Relative: 3 %
HEMATOCRIT: 34.5 % — AB (ref 36.0–46.0)
HEMOGLOBIN: 10.7 g/dL — AB (ref 12.0–15.0)
LYMPHS ABS: 0.9 10*3/uL (ref 0.7–4.0)
LYMPHS PCT: 24 %
MCH: 26.3 pg (ref 26.0–34.0)
MCHC: 31 g/dL (ref 30.0–36.0)
MCV: 84.8 fL (ref 78.0–100.0)
MONO ABS: 0.2 10*3/uL (ref 0.1–1.0)
Monocytes Relative: 6 %
NEUTROS ABS: 2.4 10*3/uL (ref 1.7–7.7)
Neutrophils Relative %: 67 %
Platelets: 99 10*3/uL — ABNORMAL LOW (ref 150–400)
RBC: 4.07 MIL/uL (ref 3.87–5.11)
RDW: 21.1 % — ABNORMAL HIGH (ref 11.5–15.5)
WBC: 3.6 10*3/uL — ABNORMAL LOW (ref 4.0–10.5)

## 2017-03-03 MED ORDER — DARBEPOETIN ALFA 100 MCG/0.5ML IJ SOSY
100.0000 ug | PREFILLED_SYRINGE | Freq: Once | INTRAMUSCULAR | Status: AC
  Administered 2017-03-03: 100 ug via SUBCUTANEOUS
  Filled 2017-03-03: qty 0.5

## 2017-03-03 MED ORDER — CYANOCOBALAMIN 1000 MCG/ML IJ SOLN
1000.0000 ug | Freq: Once | INTRAMUSCULAR | Status: AC
  Administered 2017-03-03: 1000 ug via INTRAMUSCULAR
  Filled 2017-03-03: qty 1

## 2017-03-03 NOTE — Progress Notes (Signed)
Janet Fletcher presents today for injection per the provider's orders.  B12 and Aranesp administrations without incident; see MAR for injection details.  Patient tolerated procedure well and without incident.  No questions or complaints noted at this time. Discharged ambulatory in c/o family

## 2017-03-05 ENCOUNTER — Ambulatory Visit (INDEPENDENT_AMBULATORY_CARE_PROVIDER_SITE_OTHER): Payer: Medicare HMO | Admitting: Nurse Practitioner

## 2017-03-05 ENCOUNTER — Encounter: Payer: Self-pay | Admitting: Nurse Practitioner

## 2017-03-05 VITALS — BP 113/67 | HR 76 | Temp 96.8°F | Ht 62.0 in | Wt 130.0 lb

## 2017-03-05 DIAGNOSIS — C3491 Malignant neoplasm of unspecified part of right bronchus or lung: Secondary | ICD-10-CM

## 2017-03-05 DIAGNOSIS — D508 Other iron deficiency anemias: Secondary | ICD-10-CM | POA: Diagnosis not present

## 2017-03-05 DIAGNOSIS — R413 Other amnesia: Secondary | ICD-10-CM | POA: Diagnosis not present

## 2017-03-05 DIAGNOSIS — F411 Generalized anxiety disorder: Secondary | ICD-10-CM | POA: Diagnosis not present

## 2017-03-05 DIAGNOSIS — E785 Hyperlipidemia, unspecified: Secondary | ICD-10-CM | POA: Diagnosis not present

## 2017-03-05 MED ORDER — DONEPEZIL HCL 10 MG PO TABS: 10.0000 mg | ORAL_TABLET | Freq: Every day | ORAL | 5 refills | Status: DC

## 2017-03-05 NOTE — Progress Notes (Signed)
Subjective:    Patient ID: Janet Fletcher, female    DOB: 19-Sep-1935, 81 y.o.   MRN: [id number]  HPI  Janet Fletcher is here today for follow up of chronic medical problem.  Outpatient Encounter Prescriptions as of 03/05/2017  Medication Sig  . acetaminophen (TYLENOL) 500 MG tablet Take 500 mg by mouth every 6 (six) hours as needed.   No facility-administered encounter medications on file as of 03/05/2017.     1. Small cell carcinoma of right lung [facility])  She is currently under treatment by oncology  2. Other iron deficiency anemia  Oncology is addressing this as well. whe gets b12 and ferric infusions regularly  3. Hyperlipidemia with target LDL less than 100   not on any meds currently for this  4. GAD (generalized anxiety disorder)  Stays stressed and worries a lot but os not on any medications for this    New complaints: Family called and spoke with myself about her memory. Family has noticed that she has been repeating herself a lot, very forgetful. She had radiation for brain cancer which they said would affect her memory.  Social history: Lives alone but family sees her frequently.    Review of Systems  Constitutional: Negative for activity change and appetite change.  HENT: Negative.   Eyes: Negative for pain.  Respiratory: Negative for shortness of breath.   Cardiovascular: Negative for chest pain, palpitations and leg swelling.  Gastrointestinal: Negative for abdominal pain.  Endocrine: Negative for polydipsia.  Genitourinary: Negative.   Skin: Negative for rash.  Neurological: Negative for dizziness, weakness and headaches.  Hematological: Does not bruise/bleed easily.  Psychiatric/Behavioral: Positive for confusion.  All other systems reviewed and are negative.      Objective:   Physical Exam  Constitutional: She is oriented to person, place, and time. She appears well-developed and well-nourished.  HENT:  Nose: Nose normal.  Mouth/Throat: Oropharynx  is clear and moist.  Eyes: EOM are normal.  Neck: Trachea normal, normal range of motion and full passive range of motion without pain. Neck supple. No JVD present. Carotid bruit is not present. No thyromegaly present.  Cardiovascular: Normal rate, regular rhythm, normal heart sounds and intact distal pulses.  Exam reveals no gallop and no friction rub.   No murmur heard. Pulmonary/Chest: Effort normal and breath sounds normal.  Abdominal: Soft. Bowel sounds are normal. She exhibits no distension and no mass. There is no tenderness.  Musculoskeletal: Normal range of motion.  Lymphadenopathy:    She has no cervical adenopathy.  Neurological: She is alert and oriented to person, place, and time. She has normal reflexes.  Skin: Skin is warm and dry.  Psychiatric: She has a normal mood and affect. Her behavior is normal. Judgment and thought content normal.   MMSE - Mini Mental State Exam 03/05/2017  Orientation to time 4  Orientation to Place 5  Registration 3  Attention/ Calculation 0  Recall 0  Language- name 2 objects 2  Language- repeat 1  Language- follow 3 step command 3  Language- read & follow direction 1  Write a sentence 1  Copy design 0  Total score 20    BP 113/67   Pulse 76   Temp (!) 96.8 F (36 C) (Oral)   Ht 5\' 2"  (1.575 m)   Wt 130 lb (59 kg)   BMI 23.78 kg/m         Assessment & Plan:  1. Small cell carcinoma of right lung ([place])  2. Other iron deficiency anemia Keep follow up with oncology 3. Hyperlipidemia with target LDL less than 100 Low fat diet  4. GAD (generalized anxiety disorder) Stress management  5. Memory deficit Orient daily Word puzzles or coloring Started on aricept- side effects discussed - donepezil (ARICEPT) 10 MG tablet; Take 1 tablet (10 mg total) by mouth at bedtime.  Dispense: 30 tablet; Refill: 5    Labs pending Health maintenance reviewed Diet and exercise encouraged Continue all meds Follow up  In 3 months    [place], FNP

## 2017-03-05 NOTE — Patient Instructions (Signed)

## 2017-03-11 ENCOUNTER — Telehealth: Payer: Self-pay | Admitting: Nurse Practitioner

## 2017-03-11 NOTE — Telephone Encounter (Signed)
Spoke with pt's husband regarding cholesterol med Pt received call from [facility] stating RX was ready for pick up Called [facility] to cancel RX  Med was d/ced

## 2017-03-17 ENCOUNTER — Encounter (HOSPITAL_COMMUNITY): Payer: Self-pay

## 2017-03-17 ENCOUNTER — Encounter (HOSPITAL_BASED_OUTPATIENT_CLINIC_OR_DEPARTMENT_OTHER): Payer: Medicare HMO

## 2017-03-17 ENCOUNTER — Encounter (HOSPITAL_COMMUNITY): Payer: Medicare HMO

## 2017-03-17 VITALS — BP 131/60 | HR 79 | Temp 97.7°F | Resp 16 | Wt 132.4 lb

## 2017-03-17 DIAGNOSIS — C3491 Malignant neoplasm of unspecified part of right bronchus or lung: Secondary | ICD-10-CM | POA: Diagnosis not present

## 2017-03-17 DIAGNOSIS — E785 Hyperlipidemia, unspecified: Secondary | ICD-10-CM | POA: Diagnosis not present

## 2017-03-17 DIAGNOSIS — Z9221 Personal history of antineoplastic chemotherapy: Secondary | ICD-10-CM | POA: Diagnosis not present

## 2017-03-17 DIAGNOSIS — E538 Deficiency of other specified B group vitamins: Secondary | ICD-10-CM

## 2017-03-17 DIAGNOSIS — D649 Anemia, unspecified: Secondary | ICD-10-CM

## 2017-03-17 DIAGNOSIS — Z5189 Encounter for other specified aftercare: Secondary | ICD-10-CM | POA: Diagnosis not present

## 2017-03-17 DIAGNOSIS — Z923 Personal history of irradiation: Secondary | ICD-10-CM | POA: Diagnosis not present

## 2017-03-17 DIAGNOSIS — F17210 Nicotine dependence, cigarettes, uncomplicated: Secondary | ICD-10-CM | POA: Diagnosis not present

## 2017-03-17 DIAGNOSIS — R5383 Other fatigue: Secondary | ICD-10-CM | POA: Diagnosis not present

## 2017-03-17 DIAGNOSIS — Z9889 Other specified postprocedural states: Secondary | ICD-10-CM | POA: Diagnosis not present

## 2017-03-17 LAB — CBC WITH DIFFERENTIAL/PLATELET
BASOS PCT: 1 %
Basophils Absolute: 0 10*3/uL (ref 0.0–0.1)
EOS ABS: 0.1 10*3/uL (ref 0.0–0.7)
EOS PCT: 3 %
HCT: 34.3 % — ABNORMAL LOW (ref 36.0–46.0)
Hemoglobin: 10.6 g/dL — ABNORMAL LOW (ref 12.0–15.0)
LYMPHS ABS: 0.7 10*3/uL (ref 0.7–4.0)
Lymphocytes Relative: 21 %
MCH: 26.1 pg (ref 26.0–34.0)
MCHC: 30.9 g/dL (ref 30.0–36.0)
MCV: 84.5 fL (ref 78.0–100.0)
Monocytes Absolute: 0.3 10*3/uL (ref 0.1–1.0)
Monocytes Relative: 7 %
NEUTROS PCT: 69 %
Neutro Abs: 2.5 10*3/uL (ref 1.7–7.7)
PLATELETS: 92 10*3/uL — AB (ref 150–400)
RBC: 4.06 MIL/uL (ref 3.87–5.11)
RDW: 21 % — AB (ref 11.5–15.5)
WBC: 3.6 10*3/uL — AB (ref 4.0–10.5)

## 2017-03-17 LAB — COMPREHENSIVE METABOLIC PANEL
ALT: 11 U/L — AB (ref 14–54)
AST: 23 U/L (ref 15–41)
Albumin: 3.7 g/dL (ref 3.5–5.0)
Alkaline Phosphatase: 66 U/L (ref 38–126)
Anion gap: 6 (ref 5–15)
BUN: 29 mg/dL — AB (ref 6–20)
CHLORIDE: 107 mmol/L (ref 101–111)
CO2: 28 mmol/L (ref 22–32)
CREATININE: 1.25 mg/dL — AB (ref 0.44–1.00)
Calcium: 9 mg/dL (ref 8.9–10.3)
GFR calc non Af Amer: 40 mL/min — ABNORMAL LOW (ref >60)
GFR, EST AFRICAN AMERICAN: 46 mL/min — AB (ref >60)
Glucose, Bld: 99 mg/dL (ref 65–99)
POTASSIUM: 3.7 mmol/L (ref 3.5–5.1)
SODIUM: 141 mmol/L (ref 135–145)
Total Bilirubin: 0.4 mg/dL (ref 0.3–1.2)
Total Protein: 6.9 g/dL (ref 6.5–8.1)

## 2017-03-17 LAB — IRON AND TIBC
IRON: 43 ug/dL (ref 28–170)
SATURATION RATIOS: 16 % (ref 10.4–31.8)
TIBC: 266 ug/dL (ref 250–450)
UIBC: 223 ug/dL

## 2017-03-17 LAB — FERRITIN: FERRITIN: 116 ng/mL (ref 11–307)

## 2017-03-17 MED ORDER — DARBEPOETIN ALFA 100 MCG/0.5ML IJ SOSY
PREFILLED_SYRINGE | INTRAMUSCULAR | Status: AC
  Filled 2017-03-17: qty 0.5

## 2017-03-17 MED ORDER — DARBEPOETIN ALFA 100 MCG/0.5ML IJ SOSY
100.0000 ug | PREFILLED_SYRINGE | Freq: Once | INTRAMUSCULAR | Status: AC
  Administered 2017-03-17: 100 ug via SUBCUTANEOUS

## 2017-03-17 NOTE — Patient Instructions (Signed)
[facility] at [facility] Discharge Instructions  RECOMMENDATIONS MADE BY THE CONSULTANT AND ANY TEST RESULTS WILL BE SENT TO YOUR REFERRING PHYSICIAN.  You received your Aranesp injection today Follow up as scheduled.  Thank you for choosing [facility] at [facility] to provide your oncology and hematology care.  To afford each patient quality time with our provider, please arrive at least 15 minutes before your scheduled appointment time.    If you have a lab appointment with the Cancer Center please come in thru the  Main Entrance and check in at the main information desk  You need to re-schedule your appointment should you arrive 10 or more minutes late.  We strive to give you quality time with our providers, and arriving late affects you and other patients whose appointments are after yours.  Also, if you no show three or more times for appointments you may be dismissed from the clinic at the providers discretion.     Again, thank you for choosing [facility].  Our hope is that these requests will decrease the amount of time that you wait before being seen by our physicians.       _____________________________________________________________  Should you have questions after your visit to [facility], please contact our office at [phone] between the hours of 8:30 a.m. and 4:30 p.m.  Voicemails left after 4:30 p.m. will not be returned until the following business day.  For prescription refill requests, have your pharmacy contact our office.       Resources For Cancer Patients and their Caregivers ? American Cancer Society: Can assist with transportation, wigs, general needs, runs Look Good Feel Better.        1-888-227-6333 ? Cancer Care: Provides financial assistance, online support groups, medication/co-pay assistance.  1-800-813-HOPE (4673) ? [facility] Assists [place]  cancer patients and their families through emotional , educational and financial support.  336-427-4357 ? [place] DSS Where to apply for food stamps, Medicaid and utility assistance. 336-342-1394 ? RCATS: Transportation to medical appointments. 336-347-2287 ? Social Security Administration: May apply for disability if have a Stage IV cancer. 336-342-7796 1-800-772-1213 ? [place] Aging, Disability and Transit Services: Assists with nutrition, care and transit needs. 336-349-2343  Cancer Center Support Programs: @[email or web address]@ > Cancer Support Group  2nd Tuesday of the month 1pm-2pm, Journey Room  > Creative Journey  3rd Tuesday of the month 1130am-1pm, Journey Room  > Look Good Feel Better  1st Wednesday of the month 10am-12 noon, Journey Room (Call American Cancer Society to register 1-800-395-5775)    

## 2017-03-17 NOTE — Progress Notes (Signed)
[name] presents today for injection per MD orders. Aranesp 100 mcg administered SQ in right lower abdomen. Administration without incident. Patient tolerated treatment without incidence.  Patient discharged ambulatory and in stable condition from clinic. Patient to follow up as scheduled.

## 2017-03-31 ENCOUNTER — Encounter (HOSPITAL_BASED_OUTPATIENT_CLINIC_OR_DEPARTMENT_OTHER): Payer: Medicare HMO

## 2017-03-31 ENCOUNTER — Encounter (HOSPITAL_COMMUNITY): Payer: Medicare HMO | Attending: Oncology

## 2017-03-31 ENCOUNTER — Encounter (HOSPITAL_COMMUNITY): Payer: Self-pay

## 2017-03-31 ENCOUNTER — Encounter (HOSPITAL_COMMUNITY): Payer: Medicare HMO | Attending: Oncology | Admitting: Oncology

## 2017-03-31 DIAGNOSIS — R42 Dizziness and giddiness: Secondary | ICD-10-CM

## 2017-03-31 DIAGNOSIS — C3491 Malignant neoplasm of unspecified part of right bronchus or lung: Secondary | ICD-10-CM

## 2017-03-31 DIAGNOSIS — Z8249 Family history of ischemic heart disease and other diseases of the circulatory system: Secondary | ICD-10-CM | POA: Diagnosis not present

## 2017-03-31 DIAGNOSIS — R5383 Other fatigue: Secondary | ICD-10-CM | POA: Diagnosis not present

## 2017-03-31 DIAGNOSIS — Z809 Family history of malignant neoplasm, unspecified: Secondary | ICD-10-CM | POA: Diagnosis not present

## 2017-03-31 DIAGNOSIS — E785 Hyperlipidemia, unspecified: Secondary | ICD-10-CM | POA: Insufficient documentation

## 2017-03-31 DIAGNOSIS — Z9889 Other specified postprocedural states: Secondary | ICD-10-CM | POA: Insufficient documentation

## 2017-03-31 DIAGNOSIS — D61818 Other pancytopenia: Secondary | ICD-10-CM | POA: Diagnosis not present

## 2017-03-31 DIAGNOSIS — D649 Anemia, unspecified: Secondary | ICD-10-CM | POA: Insufficient documentation

## 2017-03-31 DIAGNOSIS — Z79899 Other long term (current) drug therapy: Secondary | ICD-10-CM | POA: Insufficient documentation

## 2017-03-31 DIAGNOSIS — Z5189 Encounter for other specified aftercare: Secondary | ICD-10-CM | POA: Diagnosis not present

## 2017-03-31 DIAGNOSIS — E538 Deficiency of other specified B group vitamins: Secondary | ICD-10-CM

## 2017-03-31 DIAGNOSIS — Z9221 Personal history of antineoplastic chemotherapy: Secondary | ICD-10-CM | POA: Insufficient documentation

## 2017-03-31 DIAGNOSIS — D508 Other iron deficiency anemias: Secondary | ICD-10-CM

## 2017-03-31 DIAGNOSIS — F17210 Nicotine dependence, cigarettes, uncomplicated: Secondary | ICD-10-CM | POA: Diagnosis not present

## 2017-03-31 DIAGNOSIS — Z880 Allergy status to penicillin: Secondary | ICD-10-CM | POA: Insufficient documentation

## 2017-03-31 DIAGNOSIS — Z888 Allergy status to other drugs, medicaments and biological substances status: Secondary | ICD-10-CM | POA: Insufficient documentation

## 2017-03-31 DIAGNOSIS — Z923 Personal history of irradiation: Secondary | ICD-10-CM | POA: Diagnosis not present

## 2017-03-31 LAB — CBC WITH DIFFERENTIAL/PLATELET
BASOS ABS: 0 10*3/uL (ref 0.0–0.1)
BASOS PCT: 1 %
EOS ABS: 0.1 10*3/uL (ref 0.0–0.7)
EOS PCT: 3 %
HCT: 34.8 % — ABNORMAL LOW (ref 36.0–46.0)
Hemoglobin: 11 g/dL — ABNORMAL LOW (ref 12.0–15.0)
Lymphocytes Relative: 22 %
Lymphs Abs: 0.8 10*3/uL (ref 0.7–4.0)
MCH: 26.8 pg (ref 26.0–34.0)
MCHC: 31.6 g/dL (ref 30.0–36.0)
MCV: 84.7 fL (ref 78.0–100.0)
MONO ABS: 0.2 10*3/uL (ref 0.1–1.0)
Monocytes Relative: 6 %
Neutro Abs: 2.6 10*3/uL (ref 1.7–7.7)
Neutrophils Relative %: 69 %
PLATELETS: 86 10*3/uL — AB (ref 150–400)
RBC: 4.11 MIL/uL (ref 3.87–5.11)
RDW: 20.8 % — AB (ref 11.5–15.5)
WBC: 3.7 10*3/uL — AB (ref 4.0–10.5)

## 2017-03-31 LAB — COMPREHENSIVE METABOLIC PANEL
ALT: 9 U/L — AB (ref 14–54)
AST: 19 U/L (ref 15–41)
Albumin: 3.5 g/dL (ref 3.5–5.0)
Alkaline Phosphatase: 66 U/L (ref 38–126)
Anion gap: 7 (ref 5–15)
BUN: 23 mg/dL — AB (ref 6–20)
CHLORIDE: 102 mmol/L (ref 101–111)
CO2: 29 mmol/L (ref 22–32)
CREATININE: 1.07 mg/dL — AB (ref 0.44–1.00)
Calcium: 9 mg/dL (ref 8.9–10.3)
GFR, EST AFRICAN AMERICAN: 56 mL/min — AB (ref >60)
GFR, EST NON AFRICAN AMERICAN: 48 mL/min — AB (ref >60)
Glucose, Bld: 107 mg/dL — ABNORMAL HIGH (ref 65–99)
POTASSIUM: 4.1 mmol/L (ref 3.5–5.1)
SODIUM: 138 mmol/L (ref 135–145)
Total Bilirubin: 0.5 mg/dL (ref 0.3–1.2)
Total Protein: 6.7 g/dL (ref 6.5–8.1)

## 2017-03-31 MED ORDER — DARBEPOETIN ALFA 100 MCG/0.5ML IJ SOSY
PREFILLED_SYRINGE | INTRAMUSCULAR | Status: AC
  Filled 2017-03-31: qty 0.5

## 2017-03-31 MED ORDER — CYANOCOBALAMIN 1000 MCG/ML IJ SOLN
1000.0000 ug | Freq: Once | INTRAMUSCULAR | Status: AC
  Administered 2017-03-31: 1000 ug via INTRAMUSCULAR

## 2017-03-31 MED ORDER — DARBEPOETIN ALFA 100 MCG/0.5ML IJ SOSY
100.0000 ug | PREFILLED_SYRINGE | Freq: Once | INTRAMUSCULAR | Status: AC
  Administered 2017-03-31: 100 ug via SUBCUTANEOUS

## 2017-03-31 MED ORDER — CYANOCOBALAMIN 1000 MCG/ML IJ SOLN
INTRAMUSCULAR | Status: AC
  Filled 2017-03-31: qty 1

## 2017-03-31 NOTE — Progress Notes (Signed)
Janet Fletcher presents today for injection per MD orders. B12 1027mcg administered IM in left deltoid. Administration without incident. Patient tolerated well.  Janet Fletcher presents today for injection per MD orders. Aranesp 100 mcg administered SQ in left lower abdomen. Administration without incident. Patient tolerated well.  Patient tolerated treatment without incidence. Patient discharged ambulatory and in stable condition from clinic with daughter. Patient to follow up as scheduled.

## 2017-03-31 NOTE — Patient Instructions (Signed)
[place] at [facility] Discharge Instructions  RECOMMENDATIONS MADE BY THE CONSULTANT AND ANY TEST RESULTS WILL BE SENT TO YOUR REFERRING PHYSICIAN.  You received your B-12 injection today You also received your Aranesp injection.  Your Hgb is 11.0 Follow up in 2 weeks for next injection.  Thank you for choosing [place] at [facility] to provide your oncology and hematology care.  To afford each patient quality time with our provider, please arrive at least 15 minutes before your scheduled appointment time.    If you have a lab appointment with the [place] please come in thru the  Main Entrance and check in at the main information desk  You need to re-schedule your appointment should you arrive 10 or more minutes late.  We strive to give you quality time with our providers, and arriving late affects you and other patients whose appointments are after yours.  Also, if you no show three or more times for appointments you may be dismissed from the clinic at the providers discretion.     Again, thank you for choosing [facility].  Our hope is that these requests will decrease the amount of time that you wait before being seen by our physicians.       _____________________________________________________________  Should you have questions after your visit to [facility], please contact our office at [phone] between the hours of 8:30 a.m. and 4:30 p.m.  Voicemails left after 4:30 p.m. will not be returned until the following business day.  For prescription refill requests, have your pharmacy contact our office.       Resources For Cancer Patients and their Caregivers ? American Cancer Society: Can assist with transportation, wigs, general needs, runs Look Good Feel Better.        (463)617-1526 ? Cancer Care: Provides financial assistance, online support groups, medication/co-pay assistance.   1-800-813-HOPE (239)764-4475) ? [place] Assists [place] cancer patients and their families through emotional , educational and financial support.  706-491-1745 ? [place] DSS Where to apply for food stamps, Medicaid and utility assistance. 570 760 1220 ? RCATS: Transportation to medical appointments. 406-033-2277 ? Social Security Administration: May apply for disability if have a Stage IV cancer. 805-785-4408 215-211-9719 ? [employer], Disability and Transit Services: Assists with nutrition, care and transit needs. [place] Support Programs: @10RELATIVEDAYS @ > Cancer Support Group  2nd Tuesday of the month 1pm-2pm, Journey Room  > Creative Journey  3rd Tuesday of the month 1130am-1pm, Journey Room  > Look Good Feel Better  1st Wednesday of the month 10am-12 noon, Journey Room (Call [place] to register ([phone])

## 2017-03-31 NOTE — Progress Notes (Signed)
Janet Fletcher, [place] [id number]   CLINIC:  Medical Oncology/Hematology  PCP:  Janet Fletcher, [address] [place] [address] [phone]   REASON FOR VISIT:  Follow-up for limited stage small cell lung cancer AND Anemia   CURRENT THERAPY: Surveillance via NCCN Guidelines AND Aranesp injections every 2 weeks & B12 injections monthly    BRIEF ONCOLOGIC HISTORY:    Small cell carcinoma of right lung (Janet Fletcher)   11/28/2015 Imaging    Large anterior mediastinal mass in continuity with a R hilar mass, narrowing of SVC, abnormal densities in RUL, 9 mm spiculated nodule, nonspec. hypodensity in liver      12/05/2015 PET scan    Large hypermetabolic paratracheal mass c/w SCLC, perihilar nodular densities in RML, mild metabolic activity RUL nodule, no distant metastatic disease      12/11/2015 Procedure    Video bronch with biopsies and brushings, endobronchial ultrasound with mediastinal LN aspiration. Dr. Roxan Fletcher      12/11/2015 Pathology Results    Trachea biopsy negative, FNA RUL malignant cells c/w SCLC      12/13/2015 - 03/15/2016 Chemotherapy    Cisplatin/Etoposide x 5 cycles.  Cycle #6 cancelled due to significant cytopenias      12/25/2015 Imaging    MRI brain- No intracranial parenchymal enhancing lesion. Tiny right frontal calvarial enhancing lesion. Small metastatic lesion not excluded although this may represent an incidentally detected benign process.      12/29/2015 - 02/13/2016 Radiation Therapy    XRT      02/14/2016 Treatment Plan Change    Chemotherapy deferred x 1 week      02/21/2016 Treatment Plan Change    Carboplatin dose reduced by 20% and Etoposide dose reduced by 10%.      04/24/2016 Imaging    CT CAP- Chest Impression:  1. Marked reduction in mediastinal and RIGHT hilar lymphadenopathy with no residual pathologically enlarged nodes. 2. Resolution of perihilar nodularity in the RIGHT lung which  was hypermetabolic on FDG PET. 3. Persistent nodule in the RIGHT upper lobe which is not hypermetabolic. 4. New ground-glass nodule in the RIGHT upper lobe is nonspecific and may relate to therapy.  Abdomen / Pelvis Impression:  1. No evidence of metastatic disease in the abdomen pelvis. 2. Atherosclerotic calcification of the aorta.      04/24/2016 Imaging    Bone scan- Nonspecific increased tracer localization at approximately T11 vertebral body; this is of uncertain etiology, could represent a degenerative process though metastatic disease is not excluded.      04/29/2016 -  Radiation Therapy    PCI -- 25 Gy      07/24/2016 Imaging    CT CAP- 1. There has been interval decrease in size of mediastinal lymph nodes. The index lesion within the right upper lobe is also decreased in size from previous exam. 2. New subpleural nodule is noted within the posteromedial right lower lobe. Attention on follow-up imaging advised. 3. Stable ground-glass attenuating nodule within the right lower lobe and anteromedial left upper lobe 4. Aortic atherosclerosis and coronary artery calcification      07/29/2016 Imaging    MRI t-spine: 1. Stable benign slightly enhancing sclerotic lesion in the right posterolateral aspect of the T11 vertebral body, unchanged for 5 years. This is felt to account for the subtle increased activity at T11 on the SPECT bone scan. 2. Slight degenerative disc disease with small disc protrusions at T1-2 and T2-3 and T12-L1 with  no neural impingement. 3. No evidence of metastatic disease.      09/23/2016 Imaging    CT chest- Radiation changes in the central right upper lobe/ perihilar region.  Stable small mediastinal lymph nodes, unchanged.  No findings suspicious for new/progressive metastatic disease.      12/25/2016 Imaging    CT C/A/P: IMPRESSION: 1. Continued further decrease in mediastinal lymph nodes. 2. Index nodule antero medial right upper lobe has  decreased. The new 5 mm right lower lobe subpleural nodule seen on the previous study has resolved in the interval. Ground-glass nodules in the upper lobes bilaterally are stable. 3. Interval development of interstitial and alveolar opacity in the parahilar right lung is presumably radiation related. Attention on follow-up recommended. 4. New tiny right pleural effusion with areas of apparent pleural enhancement in the posterior right costophrenic sulcus. Close attention on follow-up recommended. 5. Marked abdominal aortic atherosclerosis with likely infrarenal significant stenosis.         INTERVAL HISTORY:  Janet Fletcher 81 y.o. female returns to cancer center for routine follow-up for limited stage SCLC and anemia.    Patient presents for follow up with her daughter Janet Fletcher today. She states she's doing well except that she has been having intermittent dizziness. She notes her dizziness is worse when she changes positions, such as getting up too quickly. This has been doing on for about a month. She denies having any recent falls. She complains of fatigue but is still able to do her usual activities. She denies any chest pain, shortness of breath, abdominal pani, focal weakness. She states her appetite is good and her weight is stable.    REVIEW OF SYSTEMS:  Review of Systems  Constitutional: Positive for fatigue. Negative for appetite change, chills and fever.  HENT:  Negative.   Eyes: Negative.  Negative for eye problems.  Respiratory: Negative.  Negative for cough and shortness of breath.   Cardiovascular: Negative.  Negative for chest pain and leg swelling.  Gastrointestinal: Negative.  Negative for abdominal pain, blood in stool, constipation, diarrhea, nausea and vomiting.  Endocrine: Negative.   Genitourinary: Negative.  Negative for dysuria, hematuria and vaginal bleeding.   Musculoskeletal: Negative.   Skin: Negative.   Neurological: Negative.  Negative for dizziness,  headaches and light-headedness.  Hematological: Negative.   Psychiatric/Behavioral: Negative.  Negative for depression and sleep disturbance. The patient is not nervous/anxious.      PAST MEDICAL/SURGICAL HISTORY:  Past Medical History:  Diagnosis Date  . Allergy   . Cancer ([place])    lung  . Hyperlipidemia   . Iron deficiency anemia 12/15/2015  . Low vitamin B12 level 12/15/2015  . Osteopenia    Past Surgical History:  Procedure Laterality Date  . CATARACT EXTRACTION, BILATERAL Bilateral   . COLONOSCOPY N/A 12/06/2016   Procedure: COLONOSCOPY;  Surgeon: Danie Binder, MD;  [facility];  Service: Endoscopy;  Laterality: N/A;  1:45pm  . ESOPHAGOGASTRODUODENOSCOPY N/A 12/06/2016   Procedure: ESOPHAGOGASTRODUODENOSCOPY (EGD);  Surgeon: Danie Binder, MD;  [facility];  Service: Endoscopy;  Laterality: N/A;  . MINOR EXCISION EAR CANAL CYST Left    30+ years ago  . SMALL INTESTINE SURGERY    . VIDEO BRONCHOSCOPY WITH ENDOBRONCHIAL ULTRASOUND N/A 12/11/2015   Procedure: VIDEO BRONCHOSCOPY WITH ENDOBRONCHIAL ULTRASOUND;  Surgeon: Melrose Nakayama, MD;  Location: [facility];  Service: Thoracic;  Laterality: N/A;     SOCIAL HISTORY:  Social History   Social History  . Marital status:  Married    Spouse name: N/A  . Number of children: N/A  . Years of education: N/A   Occupational History  . Not on file.   Social History Main Topics  . Smoking status: Former Smoker    Packs/day: 0.25    Years: 50.00    Quit date: 01/08/2016  . Smokeless tobacco: Never Used  . Alcohol use No  . Drug use: No  . Sexual activity: Not Currently   Other Topics Concern  . Not on file   Social History Narrative  . No narrative on file    FAMILY HISTORY:  Family History  Problem Relation Age of Onset  . Cancer Mother   . Hypertension Father   . Colon cancer Neg Hx     CURRENT MEDICATIONS:  Outpatient Encounter Prescriptions as of 03/31/2017  Medication Sig  .  acetaminophen (TYLENOL) 500 MG tablet Take 500 mg by mouth every 6 (six) hours as needed.  . donepezil (ARICEPT) 10 MG tablet Take 1 tablet (10 mg total) by mouth at bedtime.  . [EXPIRED] cyanocobalamin ((VITAMIN B-12)) injection 1,000 mcg   . [EXPIRED] Darbepoetin Alfa (ARANESP) injection 100 mcg   . cyanocobalamin ((VITAMIN B-12)) 1000 MCG/ML injection   . Darbepoetin Alfa (ARANESP) 100 MCG/0.5ML injection    No facility-administered encounter medications on file as of 03/31/2017.     ALLERGIES:  Allergies  Allergen Reactions  . Acetaminophen Other (See Comments)    HEART RACES AWAY/ EXTRA STRENGTH TYLENOL  . Flagyl [Metronidazole Hcl] Nausea Only  . Penicillins Other (See Comments)    Has patient had a PCN reaction causing immediate rash, facial/tongue/throat swelling, SOB or lightheadedness with hypotension: unknown Has patient had a PCN reaction causing severe rash involving mucus membranes or skin necrosis: unknown Has patient had a PCN reaction that required hospitalization unknown Has patient had a PCN reaction occurring within the last 10 years: unknown  If all of the above answers are "NO", then may proceed with Cephalosporin use.     PHYSICAL EXAM:  ECOG Performance status: 1-2 - Symptomatic; requires occasional assistance.   Blood pressure 141/66, pulse 66, respiratory 18, temp 98, O2 sat 99%  Physical Exam  Constitutional: She is oriented to person, place, and time and well-developed, well-nourished, and in no distress.  HENT:  Head: Normocephalic.  Mouth/Throat: Oropharynx is clear and moist. No oropharyngeal exudate.  Eyes: Pupils are equal, round, and reactive to light. Conjunctivae are normal. No scleral icterus.  Neck: Normal range of motion. Neck supple.  Cardiovascular: Normal rate, regular rhythm and normal heart sounds.   Pulmonary/Chest: Effort normal and breath sounds normal. No respiratory distress.  Abdominal: Soft. Bowel sounds are normal. There is no  tenderness.  Musculoskeletal: Normal range of motion. She exhibits no edema.  Lymphadenopathy:    She has no cervical adenopathy.       Right: No supraclavicular adenopathy present.       Left: No supraclavicular adenopathy present.  Neurological: She is alert and oriented to person, place, and time. No cranial nerve deficit. Gait normal.  Skin: Skin is warm and dry. No rash noted. No pallor.  Psychiatric: Mood, memory, affect and judgment normal.  Nursing note and vitals reviewed.    LABORATORY DATA:  I have reviewed the labs as listed.  CBC    Component Value Date/Time   WBC 3.7 (L) 03/31/2017 0905   RBC 4.11 03/31/2017 0905   HGB 11.0 (L) 03/31/2017 0905   HCT 34.8 (L) 03/31/2017 0932  PLT 86 (L) 03/31/2017 0905   MCV 84.7 03/31/2017 0905   MCV 71.9 (A) 08/20/2013 1559   MCH 26.8 03/31/2017 0905   MCHC 31.6 03/31/2017 0905   RDW 20.8 (H) 03/31/2017 0905   LYMPHSABS 0.8 03/31/2017 0905   MONOABS 0.2 03/31/2017 0905   EOSABS 0.1 03/31/2017 0905   BASOSABS 0.0 03/31/2017 0905   CMP Latest Ref Rng & Units 03/31/2017 03/17/2017 03/03/2017  Glucose 65 - 99 mg/dL 107(H) 99 118(H)  BUN 6 - 20 mg/dL 23(H) 29(H) 27(H)  Creatinine 0.44 - 1.00 mg/dL 1.07(H) 1.25(H) 1.24(H)  Sodium 135 - 145 mmol/L 138 141 139  Potassium 3.5 - 5.1 mmol/L 4.1 3.7 4.2  Chloride 101 - 111 mmol/L 102 107 103  CO2 22 - 32 mmol/L '29 28 28  ' Calcium 8.9 - 10.3 mg/dL 9.0 9.0 9.1  Total Protein 6.5 - 8.1 g/dL 6.7 6.9 6.7  Total Bilirubin 0.3 - 1.2 mg/dL 0.5 0.4 0.6  Alkaline Phos 38 - 126 U/L 66 66 58  AST 15 - 41 U/L '19 23 21  ' ALT 14 - 54 U/L 9(L) 11(L) 11(L)    PENDING LABS:    DIAGNOSTIC IMAGING:  Most recent CT chest: 09/23/16     PATHOLOGY:  FNA path: 12/11/15     ASSESSMENT & PLAN:   Limited stage small cell lung cancer:  -restaging CT chest stable with no findings suggestive of recurrence or metastatic disease, mediastinal lymphadenopathy continues to decrease in size. -Plan to  repeat her next restaging scans prior to her next visit in December 2018. -RTC in 3 months for labs and H&P.    Pancytopenia -Discussed lab work in detail today. Her pancytopenia has been stable.  -Continue Aranesp q2 weeks, hold for hemoglobin >11 g/dL. Her hemoglobin is 11 g/dl today. -Continue monthly B12 injections, received her dose today.  -I have again discussed possible bone marrow bx in the future should her pancytopenia worsen so that we may rule out MDS. Her platelets are a little lower today down to 86k. If her lung malignancy is stable on her next scans, I will plan to proceed with ordering a bone marrow biopsy on the next visit and the patient is agreeable.   Dispo:  -Labs every 2 weeks with Aranesp injections.  -B12 injections monthly.  -Return to cancer center in 3 months for follow-up visit with repeat CT chest 1-2 days prior.    All questions were answered to patient's stated satisfaction. Encouraged patient to call with any new concerns or questions before her next visit to the cancer center and we can certain see her sooner, if needed.     Twana First, MD

## 2017-04-03 ENCOUNTER — Ambulatory Visit (INDEPENDENT_AMBULATORY_CARE_PROVIDER_SITE_OTHER): Payer: Medicare HMO | Admitting: Nurse Practitioner

## 2017-04-03 ENCOUNTER — Encounter: Payer: Self-pay | Admitting: Nurse Practitioner

## 2017-04-03 VITALS — BP 104/59 | HR 84 | Temp 97.0°F | Ht 62.0 in | Wt 130.0 lb

## 2017-04-03 DIAGNOSIS — B9681 Helicobacter pylori [H. pylori] as the cause of diseases classified elsewhere: Secondary | ICD-10-CM | POA: Diagnosis not present

## 2017-04-03 DIAGNOSIS — R195 Other fecal abnormalities: Secondary | ICD-10-CM

## 2017-04-03 DIAGNOSIS — K2970 Gastritis, unspecified, without bleeding: Secondary | ICD-10-CM

## 2017-04-03 DIAGNOSIS — D508 Other iron deficiency anemias: Secondary | ICD-10-CM | POA: Diagnosis not present

## 2017-04-03 NOTE — Assessment & Plan Note (Signed)
Patient tested positive for H. pylori on biopsy during upper endoscopy. She was treated with Pylera. She completed all her medications. Based on Dilantin recommendations I will have her tested for eradication with H. pylori breath test. Return for follow-up in 6 months.

## 2017-04-03 NOTE — Assessment & Plan Note (Signed)
The patient does not note any blood in her stools. Continue to monitor. Return for follow-up in 6 months.

## 2017-04-03 NOTE — Patient Instructions (Signed)
1. Continue taking your current medications. 2. Have your breath test completed when you're able to. 3. Return for follow-up in 6 months. 4. Call us if you have any questions or concerns.

## 2017-04-03 NOTE — Assessment & Plan Note (Signed)
Iron levels have now normalized. Her energy is improved and her daughter agrees. Appetite is improved. She is put on more weight. No GERD or upper GI symptoms currently. She has stopped taking all NSAIDs and now only uses Tylenol as needed. Recommend she continue her current prescription medications, no changes from last. Return for follow-up in 6 months. Call if worsening symptoms before then. Continue to see hematology/oncology as recommended.

## 2017-04-03 NOTE — Progress Notes (Signed)
Referring Provider: Chevis Pretty, * Primary Care Physician:  Chevis Pretty, FNP Primary GI:  Dr. Oneida Alar  Chief Complaint  Patient presents with  . Anemia    f/u, doing ok    HPI:   Janet Fletcher is a 81 y.o. female who presents For follow-up on iron deficiency anemia. The patient was last seen in our office for 19 2018. For the same. She was initially referred to [place] by her oncologist and has small cell lung cancer and anemia. Her hemoglobin was as low as 8.9 on Aranesp and B12 injections and was previously heme positive. At her last visit she was doing well overall, no obvious hematochezia or melena and had been some time since her last colonoscopy. Weight loss improving after completing chemotherapy, strength improving but her daughter felt she still had decreased energy. No other GI symptoms. Recommended colonoscopy and EGD for further evaluation. Follow-up in 2 months.  Colonoscopy and EGD report reviewed. Colonoscopy and EGD images viewed. Colonoscopy was completed on 12/06/2016 which found no source for normocytic anemia, redundant left colon, diverticulosis in the rectosigmoid colon, heme positive stools likely due to internal hemorrhoids. He completed same day found moderate gastritis due to NSAID use/possible H. pylori, status post gastric biopsies, normocytic anemia due to gastritis, chronic renal insufficiency, and chronic disease. Recommended avoid NSAIDs, high-fiber diet, follow-up in 3 months.  Surgical pathology report reviewed. Gastric biopsies found positive for H. pylori. She was treated with Pylera for 10 days in addition to omeprazole.  Most recent CBC and iron studies reviewed which withdrawn 04/09/2017. Hemoglobin significantly improved to 11.0. Normocytic, normochromic indices. Ferritin and iron levels now normal.  She is accompanied by her daughter. Today she states she;s doing well overall. Stoped NSAIDs, only takes Tylenol now. Energy is improved  from a few months ago. Denies hematochezia and melena, fever, chills, unintentional weight loss. She has gained a little more weight after chemotherapy. Appetite is improving. Chemo and radiation completed about 4-5 months ago. Denies chest pain, dyspnea, dizziness, lightheadedness, syncope, near syncope. Denies any other upper or lower GI symptoms.  Past Medical History:  Diagnosis Date  . Allergy   . Cancer ([place])    lung  . Hyperlipidemia   . Iron deficiency anemia 12/15/2015  . Low vitamin B12 level 12/15/2015  . Osteopenia     Past Surgical History:  Procedure Laterality Date  . CATARACT EXTRACTION, BILATERAL Bilateral   . COLONOSCOPY N/A 12/06/2016   Procedure: COLONOSCOPY;  Surgeon: Danie Binder, MD;  [facility];  Service: Endoscopy;  Laterality: N/A;  1:45pm  . ESOPHAGOGASTRODUODENOSCOPY N/A 12/06/2016   Procedure: ESOPHAGOGASTRODUODENOSCOPY (EGD);  Surgeon: Danie Binder, MD;  [facility];  Service: Endoscopy;  Laterality: N/A;  . MINOR EXCISION EAR CANAL CYST Left    30+ years ago  . SMALL INTESTINE SURGERY    . VIDEO BRONCHOSCOPY WITH ENDOBRONCHIAL ULTRASOUND N/A 12/11/2015   Procedure: VIDEO BRONCHOSCOPY WITH ENDOBRONCHIAL ULTRASOUND;  Surgeon: Melrose Nakayama, MD;  Location: [facility];  Service: Thoracic;  Laterality: N/A;    Current Outpatient Prescriptions  Medication Sig Dispense Refill  . acetaminophen (TYLENOL) 500 MG tablet Take 500 mg by mouth every 6 (six) hours as needed.    . donepezil (ARICEPT) 10 MG tablet Take 1 tablet (10 mg total) by mouth at bedtime. 30 tablet 5   No current facility-administered medications for this visit.     Allergies as of 04/03/2017 - Review Complete 04/03/2017  Allergen  Reaction Noted  . Acetaminophen Other (See Comments) 10/11/2015  . Flagyl [metronidazole hcl] Nausea Only 08/05/2011  . Penicillins Other (See Comments) 08/05/2011    Family History  Problem Relation Age of Onset  . Cancer Mother     . Hypertension Father   . Colon cancer Neg Hx     Social History   Social History  . Marital status: Married    Spouse name: N/A  . Number of children: N/A  . Years of education: N/A   Social History Main Topics  . Smoking status: Former Smoker    Packs/day: 0.25    Years: 50.00    Quit date: 01/08/2016  . Smokeless tobacco: Never Used  . Alcohol use No  . Drug use: No  . Sexual activity: Not Currently   Other Topics Concern  . None   Social History Narrative  . None    Review of Systems: Complete ROS negative except as per HPI.   Physical Exam: BP (!) 104/59   Pulse 84   Temp (!) 97 F (36.1 C) (Oral)   Ht 5\' 2"  (1.575 m)   Wt 130 lb (59 kg)   BMI 23.78 kg/m  General:   Alert and oriented. Pleasant and cooperative. Well-nourished and well-developed.  Eyes:  Without icterus, sclera clear and conjunctiva pink.  Ears:  Normal auditory acuity. Cardiovascular:  S1, S2 present without murmurs appreciated. Extremities without clubbing or edema. Respiratory:  Clear to auscultation bilaterally. No wheezes, rales, or rhonchi. No distress.  Gastrointestinal:  +BS, soft, non-tender and non-distended. No HSM noted. No guarding or rebound. No masses appreciated.  Rectal:  Deferred  Musculoskalatal:  Symmetrical without gross deformities. Skin:  Intact without significant lesions or rashes. Neurologic:  Alert and oriented x4;  grossly normal neurologically. Psych:  Alert and cooperative. Normal mood and affect. Heme/Lymph/Immune: No excessive bruising noted.    04/03/2017 9:51 AM   Disclaimer: This note was dictated with voice recognition software. Similar sounding words can inadvertently be transcribed and may not be corrected upon review.

## 2017-04-03 NOTE — Progress Notes (Signed)
CC'ED TO PCP 

## 2017-04-14 ENCOUNTER — Encounter (HOSPITAL_COMMUNITY): Payer: Self-pay

## 2017-04-14 ENCOUNTER — Encounter (HOSPITAL_BASED_OUTPATIENT_CLINIC_OR_DEPARTMENT_OTHER): Payer: Medicare HMO

## 2017-04-14 ENCOUNTER — Encounter (HOSPITAL_COMMUNITY): Payer: Medicare HMO

## 2017-04-14 VITALS — BP 108/72 | HR 68 | Resp 16

## 2017-04-14 DIAGNOSIS — R5383 Other fatigue: Secondary | ICD-10-CM | POA: Diagnosis not present

## 2017-04-14 DIAGNOSIS — D649 Anemia, unspecified: Secondary | ICD-10-CM | POA: Diagnosis not present

## 2017-04-14 DIAGNOSIS — E785 Hyperlipidemia, unspecified: Secondary | ICD-10-CM | POA: Diagnosis not present

## 2017-04-14 DIAGNOSIS — Z923 Personal history of irradiation: Secondary | ICD-10-CM | POA: Diagnosis not present

## 2017-04-14 DIAGNOSIS — C3491 Malignant neoplasm of unspecified part of right bronchus or lung: Secondary | ICD-10-CM

## 2017-04-14 DIAGNOSIS — F17210 Nicotine dependence, cigarettes, uncomplicated: Secondary | ICD-10-CM | POA: Diagnosis not present

## 2017-04-14 DIAGNOSIS — Z9889 Other specified postprocedural states: Secondary | ICD-10-CM | POA: Diagnosis not present

## 2017-04-14 DIAGNOSIS — Z5189 Encounter for other specified aftercare: Secondary | ICD-10-CM | POA: Diagnosis not present

## 2017-04-14 DIAGNOSIS — E538 Deficiency of other specified B group vitamins: Secondary | ICD-10-CM

## 2017-04-14 DIAGNOSIS — Z9221 Personal history of antineoplastic chemotherapy: Secondary | ICD-10-CM | POA: Diagnosis not present

## 2017-04-14 LAB — CBC WITH DIFFERENTIAL/PLATELET
BASOS ABS: 0 10*3/uL (ref 0.0–0.1)
Basophils Relative: 1 %
EOS PCT: 3 %
Eosinophils Absolute: 0.1 10*3/uL (ref 0.0–0.7)
HEMATOCRIT: 33.7 % — AB (ref 36.0–46.0)
Hemoglobin: 10.3 g/dL — ABNORMAL LOW (ref 12.0–15.0)
LYMPHS ABS: 1 10*3/uL (ref 0.7–4.0)
LYMPHS PCT: 25 %
MCH: 25.7 pg — AB (ref 26.0–34.0)
MCHC: 30.6 g/dL (ref 30.0–36.0)
MCV: 84 fL (ref 78.0–100.0)
MONO ABS: 0.3 10*3/uL (ref 0.1–1.0)
Monocytes Relative: 7 %
NEUTROS ABS: 2.6 10*3/uL (ref 1.7–7.7)
Neutrophils Relative %: 64 %
PLATELETS: 89 10*3/uL — AB (ref 150–400)
RBC: 4.01 MIL/uL (ref 3.87–5.11)
RDW: 20.8 % — AB (ref 11.5–15.5)
WBC: 4 10*3/uL (ref 4.0–10.5)

## 2017-04-14 LAB — COMPREHENSIVE METABOLIC PANEL
ALBUMIN: 3.5 g/dL (ref 3.5–5.0)
ALK PHOS: 61 U/L (ref 38–126)
ALT: 12 U/L — ABNORMAL LOW (ref 14–54)
ANION GAP: 9 (ref 5–15)
AST: 24 U/L (ref 15–41)
BILIRUBIN TOTAL: 0.8 mg/dL (ref 0.3–1.2)
BUN: 28 mg/dL — ABNORMAL HIGH (ref 6–20)
CALCIUM: 9 mg/dL (ref 8.9–10.3)
CO2: 29 mmol/L (ref 22–32)
Chloride: 101 mmol/L (ref 101–111)
Creatinine, Ser: 1.19 mg/dL — ABNORMAL HIGH (ref 0.44–1.00)
GFR calc non Af Amer: 42 mL/min — ABNORMAL LOW (ref >60)
GFR, EST AFRICAN AMERICAN: 49 mL/min — AB (ref >60)
GLUCOSE: 99 mg/dL (ref 65–99)
POTASSIUM: 4 mmol/L (ref 3.5–5.1)
SODIUM: 139 mmol/L (ref 135–145)
TOTAL PROTEIN: 6.7 g/dL (ref 6.5–8.1)

## 2017-04-14 MED ORDER — DARBEPOETIN ALFA 100 MCG/0.5ML IJ SOSY
100.0000 ug | PREFILLED_SYRINGE | Freq: Once | INTRAMUSCULAR | Status: AC
  Administered 2017-04-14: 100 ug via SUBCUTANEOUS

## 2017-04-14 MED ORDER — DARBEPOETIN ALFA 100 MCG/0.5ML IJ SOSY
PREFILLED_SYRINGE | INTRAMUSCULAR | Status: AC
  Filled 2017-04-14: qty 0.5

## 2017-04-14 NOTE — Progress Notes (Signed)
Janet Fletcher presents today for injection per MD orders. Aranesp 100 mcg administered SQ in right lower abdomen. Administration without incident. Patient tolerated well. Patient tolerated treatment without incidence. Patient discharged ambulatory and in stable condition from clinic with daughters. Patient to follow up as scheduled.

## 2017-04-14 NOTE — Patient Instructions (Signed)
[place] at [facility] Discharge Instructions  RECOMMENDATIONS MADE BY THE CONSULTANT AND ANY TEST RESULTS WILL BE SENT TO YOUR REFERRING PHYSICIAN.  You received your Aranesp injection today Follow up in 2 weeks as scheduled.  Thank you for choosing [facility] at [facility] to provide your oncology and hematology care.  To afford each patient quality time with our provider, please arrive at least 15 minutes before your scheduled appointment time.    If you have a lab appointment with the [facility] please come in thru the  Main Entrance and check in at the main information desk  You need to re-schedule your appointment should you arrive 10 or more minutes late.  We strive to give you quality time with our providers, and arriving late affects you and other patients whose appointments are after yours.  Also, if you no show three or more times for appointments you may be dismissed from the clinic at the providers discretion.     Again, thank you for choosing [facility].  Our hope is that these requests will decrease the amount of time that you wait before being seen by our physicians.       _____________________________________________________________  Should you have questions after your visit to [facility], please contact our office at [phone] between the hours of 8:30 a.m. and 4:30 p.m.  Voicemails left after 4:30 p.m. will not be returned until the following business day.  For prescription refill requests, have your pharmacy contact our office.       Resources For Cancer Patients and their Caregivers ? American Cancer Society: Can assist with transportation, wigs, general needs, runs Look Good Feel Better.        769-786-4205 ? Cancer Care: Provides financial assistance, online support groups, medication/co-pay assistance.  1-800-813-HOPE 480-075-0218) ? [place] Assists  [place] cancer patients and their families through emotional , educational and financial support.  641 613 0117 ? [place] DSS Where to apply for food stamps, Medicaid and utility assistance. 819-011-1773 ? [facility]: Transportation to medical appointments. 209-120-2083 ? Social Security Administration: May apply for disability if have a Stage IV cancer. 901-534-7235 4422885540 ? [employer], Disability and Transit Services: Assists with nutrition, care and transit needs. [place] Support Programs: @10RELATIVEDAYS @ > Cancer Support Group  2nd Tuesday of the month 1pm-2pm, Journey Room  > Creative Journey  3rd Tuesday of the month 1130am-1pm, Journey Room  > Look Good Feel Better  1st Wednesday of the month 10am-12 noon, Journey Room (Call [place] [facility] to register [phone])

## 2017-04-28 ENCOUNTER — Encounter (HOSPITAL_BASED_OUTPATIENT_CLINIC_OR_DEPARTMENT_OTHER): Payer: Medicare HMO

## 2017-04-28 ENCOUNTER — Encounter (HOSPITAL_COMMUNITY): Payer: Medicare HMO | Attending: Oncology

## 2017-04-28 ENCOUNTER — Encounter (HOSPITAL_COMMUNITY): Payer: Self-pay

## 2017-04-28 VITALS — BP 120/68 | HR 67 | Temp 98.5°F | Resp 16 | Wt 131.8 lb

## 2017-04-28 DIAGNOSIS — Z23 Encounter for immunization: Secondary | ICD-10-CM | POA: Diagnosis not present

## 2017-04-28 DIAGNOSIS — C3491 Malignant neoplasm of unspecified part of right bronchus or lung: Secondary | ICD-10-CM | POA: Insufficient documentation

## 2017-04-28 DIAGNOSIS — D649 Anemia, unspecified: Secondary | ICD-10-CM

## 2017-04-28 DIAGNOSIS — E538 Deficiency of other specified B group vitamins: Secondary | ICD-10-CM

## 2017-04-28 LAB — CBC WITH DIFFERENTIAL/PLATELET
Basophils Absolute: 0 10*3/uL (ref 0.0–0.1)
Basophils Relative: 1 %
EOS ABS: 0.1 10*3/uL (ref 0.0–0.7)
EOS PCT: 2 %
HCT: 33.8 % — ABNORMAL LOW (ref 36.0–46.0)
Hemoglobin: 10.3 g/dL — ABNORMAL LOW (ref 12.0–15.0)
LYMPHS ABS: 0.7 10*3/uL (ref 0.7–4.0)
Lymphocytes Relative: 22 %
MCH: 25.9 pg — AB (ref 26.0–34.0)
MCHC: 30.5 g/dL (ref 30.0–36.0)
MCV: 84.9 fL (ref 78.0–100.0)
Monocytes Absolute: 0.3 10*3/uL (ref 0.1–1.0)
Monocytes Relative: 8 %
Neutro Abs: 2.2 10*3/uL (ref 1.7–7.7)
Neutrophils Relative %: 66 %
PLATELETS: DECREASED 10*3/uL (ref 150–400)
RBC: 3.98 MIL/uL (ref 3.87–5.11)
RDW: 20.5 % — ABNORMAL HIGH (ref 11.5–15.5)
WBC: 3.3 10*3/uL — AB (ref 4.0–10.5)

## 2017-04-28 LAB — COMPREHENSIVE METABOLIC PANEL
ALT: 10 U/L — ABNORMAL LOW (ref 14–54)
ANION GAP: 7 (ref 5–15)
AST: 18 U/L (ref 15–41)
Albumin: 3.5 g/dL (ref 3.5–5.0)
Alkaline Phosphatase: 62 U/L (ref 38–126)
BUN: 29 mg/dL — ABNORMAL HIGH (ref 6–20)
CHLORIDE: 104 mmol/L (ref 101–111)
CO2: 26 mmol/L (ref 22–32)
Calcium: 8.9 mg/dL (ref 8.9–10.3)
Creatinine, Ser: 1.2 mg/dL — ABNORMAL HIGH (ref 0.44–1.00)
GFR calc non Af Amer: 42 mL/min — ABNORMAL LOW (ref >60)
GFR, EST AFRICAN AMERICAN: 48 mL/min — AB (ref >60)
Glucose, Bld: 102 mg/dL — ABNORMAL HIGH (ref 65–99)
POTASSIUM: 4 mmol/L (ref 3.5–5.1)
SODIUM: 137 mmol/L (ref 135–145)
Total Bilirubin: 0.6 mg/dL (ref 0.3–1.2)
Total Protein: 6.5 g/dL (ref 6.5–8.1)

## 2017-04-28 MED ORDER — DARBEPOETIN ALFA 100 MCG/0.5ML IJ SOSY
100.0000 ug | PREFILLED_SYRINGE | Freq: Once | INTRAMUSCULAR | Status: AC
  Administered 2017-04-28: 100 ug via SUBCUTANEOUS

## 2017-04-28 MED ORDER — CYANOCOBALAMIN 1000 MCG/ML IJ SOLN
1000.0000 ug | Freq: Once | INTRAMUSCULAR | Status: AC
  Administered 2017-04-28: 1000 ug via INTRAMUSCULAR

## 2017-04-28 MED ORDER — INFLUENZA VAC SPLIT HIGH-DOSE 0.5 ML IM SUSY
0.5000 mL | PREFILLED_SYRINGE | INTRAMUSCULAR | Status: AC
  Administered 2017-04-28: 0.5 mL via INTRAMUSCULAR
  Filled 2017-04-28: qty 0.5

## 2017-04-28 MED ORDER — CYANOCOBALAMIN 1000 MCG/ML IJ SOLN
INTRAMUSCULAR | Status: AC
  Filled 2017-04-28: qty 1

## 2017-04-28 MED ORDER — DARBEPOETIN ALFA 100 MCG/0.5ML IJ SOSY
PREFILLED_SYRINGE | INTRAMUSCULAR | Status: AC
  Filled 2017-04-28: qty 0.5

## 2017-04-28 NOTE — Patient Instructions (Signed)
[place] at [facility] Discharge Instructions  RECOMMENDATIONS MADE BY THE CONSULTANT AND ANY TEST RESULTS WILL BE SENT TO YOUR REFERRING PHYSICIAN.  B12 injection, aranesp injection, flu vaccine Follow up as scheduled.  Thank you for choosing [place] at [facility] to provide your oncology and hematology care.  To afford each patient quality time with our provider, please arrive at least 15 minutes before your scheduled appointment time.    If you have a lab appointment with the [place] please come in thru the  Main Entrance and check in at the main information desk  You need to re-schedule your appointment should you arrive 10 or more minutes late.  We strive to give you quality time with our providers, and arriving late affects you and other patients whose appointments are after yours.  Also, if you no show three or more times for appointments you may be dismissed from the clinic at the providers discretion.     Again, thank you for choosing [facility].  Our hope is that these requests will decrease the amount of time that you wait before being seen by our physicians.       _____________________________________________________________  Should you have questions after your visit to [facility], please contact our office at [phone] between the hours of 8:30 a.m. and 4:30 p.m.  Voicemails left after 4:30 p.m. will not be returned until the following business day.  For prescription refill requests, have your pharmacy contact our office.       Resources For Cancer Patients and their Caregivers ? American Cancer Society: Can assist with transportation, wigs, general needs, runs Look Good Feel Better.        639-778-5048 ? Cancer Care: Provides financial assistance, online support groups, medication/co-pay assistance.  1-800-813-HOPE 509-390-3574) ? [place] Assists [place] cancer patients and their families through emotional , educational and financial support.  (365) 702-7453 ? [place] DSS Where to apply for food stamps, Medicaid and utility assistance. 747-262-6347 ? RCATS: Transportation to medical appointments. 657-460-8752 ? Social Security Administration: May apply for disability if have a Stage IV cancer. 905-251-2930 (979)797-0815 ? [employer], Disability and Transit Services: Assists with nutrition, care and transit needs. [place] Support Programs: @10RELATIVEDAYS @ > Cancer Support Group  2nd Tuesday of the month 1pm-2pm, Journey Room  > Creative Journey  3rd Tuesday of the month 1130am-1pm, Journey Room  > Look Good Feel Better  1st Wednesday of the month 10am-12 noon, Journey Room (Call [place] to register [phone])

## 2017-04-28 NOTE — Progress Notes (Signed)
Janet Fletcher presents today for injection per MD orders. Aranesp 177mcg administered SQ in right Abdomen. Administration without incident. Patient tolerated well.  Janet Fletcher presents today for injection per MD orders. B12 1,035mcg administered IM in left Upper Arm. Administration without incident. Patient tolerated it well.   Janet Fletcher presents today for injection per MD orders.  Fluzone high-dose 0.63ml administered IM in right Upper Arm. Administration without incident. Patient tolerated well.  Treatment given per orders. Patient tolerated it well without problems. Vitals stable and discharged home from clinic ambulatory. Follow up as scheduled.

## 2017-05-12 ENCOUNTER — Encounter (HOSPITAL_COMMUNITY): Payer: Medicare HMO

## 2017-05-12 DIAGNOSIS — C3491 Malignant neoplasm of unspecified part of right bronchus or lung: Secondary | ICD-10-CM

## 2017-05-12 LAB — COMPREHENSIVE METABOLIC PANEL
ALBUMIN: 3.8 g/dL (ref 3.5–5.0)
ALK PHOS: 76 U/L (ref 38–126)
ALT: 11 U/L — AB (ref 14–54)
ANION GAP: 8 (ref 5–15)
AST: 19 U/L (ref 15–41)
BILIRUBIN TOTAL: 0.6 mg/dL (ref 0.3–1.2)
BUN: 21 mg/dL — AB (ref 6–20)
CALCIUM: 9.1 mg/dL (ref 8.9–10.3)
CO2: 29 mmol/L (ref 22–32)
CREATININE: 1.18 mg/dL — AB (ref 0.44–1.00)
Chloride: 105 mmol/L (ref 101–111)
GFR calc Af Amer: 49 mL/min — ABNORMAL LOW (ref >60)
GFR calc non Af Amer: 43 mL/min — ABNORMAL LOW (ref >60)
GLUCOSE: 108 mg/dL — AB (ref 65–99)
Potassium: 3.7 mmol/L (ref 3.5–5.1)
Sodium: 142 mmol/L (ref 135–145)
Total Protein: 7.1 g/dL (ref 6.5–8.1)

## 2017-05-12 LAB — CBC WITH DIFFERENTIAL/PLATELET
BASOS ABS: 0 10*3/uL (ref 0.0–0.1)
Basophils Relative: 1 %
EOS PCT: 3 %
Eosinophils Absolute: 0.1 10*3/uL (ref 0.0–0.7)
HEMATOCRIT: 36.3 % (ref 36.0–46.0)
Hemoglobin: 11.2 g/dL — ABNORMAL LOW (ref 12.0–15.0)
LYMPHS ABS: 1 10*3/uL (ref 0.7–4.0)
LYMPHS PCT: 29 %
MCH: 26 pg (ref 26.0–34.0)
MCHC: 30.9 g/dL (ref 30.0–36.0)
MCV: 84.2 fL (ref 78.0–100.0)
MONO ABS: 0.3 10*3/uL (ref 0.1–1.0)
Monocytes Relative: 7 %
Neutro Abs: 2 10*3/uL (ref 1.7–7.7)
Neutrophils Relative %: 60 %
PLATELETS: 104 10*3/uL — AB (ref 150–400)
RBC: 4.31 MIL/uL (ref 3.87–5.11)
RDW: 20 % — AB (ref 11.5–15.5)
WBC: 3.4 10*3/uL — ABNORMAL LOW (ref 4.0–10.5)

## 2017-05-12 NOTE — Patient Instructions (Addendum)
[place] at [facility] Discharge Instructions  RECOMMENDATIONS MADE BY THE CONSULTANT AND ANY TEST RESULTS WILL BE SENT TO YOUR REFERRING PHYSICIAN.  You will not get your Aranesp injection today Follow up in 2 weeks for next appointment  Thank you for choosing [place] at [facility] to provide your oncology and hematology care.  To afford each patient quality time with our provider, please arrive at least 15 minutes before your scheduled appointment time.    If you have a lab appointment with the [facility] please come in thru the  Main Entrance and check in at the main information desk  You need to re-schedule your appointment should you arrive 10 or more minutes late.  We strive to give you quality time with our providers, and arriving late affects you and other patients whose appointments are after yours.  Also, if you no show three or more times for appointments you may be dismissed from the clinic at the providers discretion.     Again, thank you for choosing [facility].  Our hope is that these requests will decrease the amount of time that you wait before being seen by our physicians.       _____________________________________________________________  Should you have questions after your visit to [facility], please contact our office at [phone] between the hours of 8:30 a.m. and 4:30 p.m.  Voicemails left after 4:30 p.m. will not be returned until the following business day.  For prescription refill requests, have your pharmacy contact our office.       Resources For Cancer Patients and their Caregivers ? American Cancer Society: Can assist with transportation, wigs, general needs, runs Look Good Feel Better.        ([phone] ? Cancer Care: Provides financial assistance, online support groups, medication/co-pay assistance.  1-800-813-HOPE 510-801-7068) ? [place] Assists [place] cancer patients and their families through emotional , educational and financial support.  5183045570 ? [place] DSS Where to apply for food stamps, Medicaid and utility assistance. 917-352-0335 ? RCATS: Transportation to medical appointments. (918)563-5832 ? Social Security Administration: May apply for disability if have a Stage IV cancer. 430-605-7456 419-550-0867 ? [employer], Disability and Transit Services: Assists with nutrition, care and transit needs. [place] Support Programs: @10RELATIVEDAYS @ > Cancer Support Group  2nd Tuesday of the month 1pm-2pm, Journey Room  > Creative Journey  3rd Tuesday of the month 1130am-1pm, Journey Room  > Look Good Feel Better  1st Wednesday of the month 10am-12 noon, Journey Room (Call [place] to register [phone])

## 2017-05-12 NOTE — Progress Notes (Signed)
Patient's Hgb 11.2 today. We will hold her Aranesp injection. Patient to follow up in 2 weeks with lab and next injection.  Patient discharged stable condition and ambulatory with daughter.

## 2017-05-15 NOTE — Congregational Nurse Program (Signed)
Congregational Nurse Program Note  Date of Encounter: 05/15/2017  Past Medical History: Past Medical History:  Diagnosis Date  . Allergy   . Cancer ([place])    lung  . Hyperlipidemia   . Iron deficiency anemia 12/15/2015  . Low vitamin B12 level 12/15/2015  . Osteopenia     Encounter Details:     CNP Questionnaire - 05/09/17 1230      Patient Demographics   Is this a new or existing patient? Existing   Patient is considered a/an Not Applicable   Race African-American/Black     Patient Assistance   Location of Patient Assistance [address]   Patient's financial/insurance status Medicare   Uninsured Patient ([place] Card/Care Connects) No   Patient referred to apply for the following financial assistance Not Applicable   Food insecurities addressed Provided food supplies   Transportation assistance No   Assistance securing medications No   Educational health offerings Nutrition;Safety;Health literacy     Encounter Details   Primary purpose of visit Education/Health Concerns;Safety   Was an Emergency Department visit averted? Not Applicable   Does patient have a medical provider? Yes   Patient referred to Not Applicable   Was a mental health screening completed? (GAINS tool) No   Does patient have dental issues? No   Does patient have vision issues? Yes  Wears glasses   Was a vision referral made? No   Does your patient have an abnormal blood pressure today? No   Since previous encounter, have you referred patient for abnormal blood pressure that resulted in a new diagnosis or medication change? No   Does your patient have an abnormal blood glucose today? No   Since previous encounter, have you referred patient for abnormal blood glucose that resulted in a new diagnosis or medication change? No   Was there a life-saving intervention made? No     10/19/18Takes no meds, Blood pressure 116/66, Pulse 64.  Theron Arista, RN  [phone].

## 2017-05-23 ENCOUNTER — Other Ambulatory Visit (HOSPITAL_COMMUNITY): Payer: Self-pay | Admitting: *Deleted

## 2017-05-23 DIAGNOSIS — D508 Other iron deficiency anemias: Secondary | ICD-10-CM

## 2017-05-23 DIAGNOSIS — C3491 Malignant neoplasm of unspecified part of right bronchus or lung: Secondary | ICD-10-CM

## 2017-05-26 ENCOUNTER — Encounter (HOSPITAL_COMMUNITY): Payer: Medicare HMO | Attending: Oncology

## 2017-05-26 ENCOUNTER — Encounter (HOSPITAL_COMMUNITY): Payer: Self-pay

## 2017-05-26 ENCOUNTER — Encounter (HOSPITAL_BASED_OUTPATIENT_CLINIC_OR_DEPARTMENT_OTHER): Payer: Medicare HMO

## 2017-05-26 VITALS — BP 111/61 | HR 79 | Temp 98.5°F | Resp 18 | Wt 135.2 lb

## 2017-05-26 DIAGNOSIS — D649 Anemia, unspecified: Secondary | ICD-10-CM | POA: Diagnosis not present

## 2017-05-26 DIAGNOSIS — E538 Deficiency of other specified B group vitamins: Secondary | ICD-10-CM | POA: Diagnosis not present

## 2017-05-26 DIAGNOSIS — D508 Other iron deficiency anemias: Secondary | ICD-10-CM

## 2017-05-26 DIAGNOSIS — C3490 Malignant neoplasm of unspecified part of unspecified bronchus or lung: Secondary | ICD-10-CM | POA: Diagnosis not present

## 2017-05-26 LAB — COMPREHENSIVE METABOLIC PANEL
ALT: 11 U/L — AB (ref 14–54)
AST: 18 U/L (ref 15–41)
Albumin: 3.4 g/dL — ABNORMAL LOW (ref 3.5–5.0)
Alkaline Phosphatase: 60 U/L (ref 38–126)
Anion gap: 7 (ref 5–15)
BILIRUBIN TOTAL: 0.7 mg/dL (ref 0.3–1.2)
BUN: 22 mg/dL — AB (ref 6–20)
CHLORIDE: 104 mmol/L (ref 101–111)
CO2: 28 mmol/L (ref 22–32)
CREATININE: 1.26 mg/dL — AB (ref 0.44–1.00)
Calcium: 8.8 mg/dL — ABNORMAL LOW (ref 8.9–10.3)
GFR, EST AFRICAN AMERICAN: 46 mL/min — AB (ref >60)
GFR, EST NON AFRICAN AMERICAN: 39 mL/min — AB (ref >60)
Glucose, Bld: 88 mg/dL (ref 65–99)
Potassium: 4.2 mmol/L (ref 3.5–5.1)
Sodium: 139 mmol/L (ref 135–145)
TOTAL PROTEIN: 6.4 g/dL — AB (ref 6.5–8.1)

## 2017-05-26 LAB — CBC WITH DIFFERENTIAL/PLATELET
Basophils Absolute: 0 10*3/uL (ref 0.0–0.1)
Basophils Relative: 1 %
EOS PCT: 3 %
Eosinophils Absolute: 0.1 10*3/uL (ref 0.0–0.7)
HEMATOCRIT: 31.2 % — AB (ref 36.0–46.0)
Hemoglobin: 9.8 g/dL — ABNORMAL LOW (ref 12.0–15.0)
LYMPHS ABS: 0.8 10*3/uL (ref 0.7–4.0)
LYMPHS PCT: 28 %
MCH: 26.2 pg (ref 26.0–34.0)
MCHC: 31.4 g/dL (ref 30.0–36.0)
MCV: 83.4 fL (ref 78.0–100.0)
MONO ABS: 0.2 10*3/uL (ref 0.1–1.0)
Monocytes Relative: 6 %
NEUTROS ABS: 1.8 10*3/uL (ref 1.7–7.7)
Neutrophils Relative %: 63 %
PLATELETS: 80 10*3/uL — AB (ref 150–400)
RBC: 3.74 MIL/uL — AB (ref 3.87–5.11)
RDW: 20.2 % — AB (ref 11.5–15.5)
WBC: 2.9 10*3/uL — ABNORMAL LOW (ref 4.0–10.5)

## 2017-05-26 MED ORDER — CYANOCOBALAMIN 1000 MCG/ML IJ SOLN
INTRAMUSCULAR | Status: AC
  Filled 2017-05-26: qty 1

## 2017-05-26 MED ORDER — CYANOCOBALAMIN 1000 MCG/ML IJ SOLN
1000.0000 ug | Freq: Once | INTRAMUSCULAR | Status: AC
  Administered 2017-05-26: 1000 ug via INTRAMUSCULAR

## 2017-05-26 MED ORDER — DARBEPOETIN ALFA 100 MCG/0.5ML IJ SOSY
100.0000 ug | PREFILLED_SYRINGE | Freq: Once | INTRAMUSCULAR | Status: AC
  Administered 2017-05-26: 100 ug via SUBCUTANEOUS
  Filled 2017-05-26: qty 0.5

## 2017-05-26 NOTE — Progress Notes (Signed)
Janet Fletcher tolerated Vit B12 and Aranesp injections well without complaints or incident. Hgb 9.8 today. VSS Pt discharged self ambulatory in satisfactory condition accompanied by family member

## 2017-05-26 NOTE — Patient Instructions (Signed)
[place] at [facility] Discharge Instructions  RECOMMENDATIONS MADE BY THE CONSULTANT AND ANY TEST RESULTS WILL BE SENT TO YOUR REFERRING PHYSICIAN.  Received Aranesp and Vit B12 injections today. Follow-up as scheduled. Call clinic for any questions or concerns  Thank you for choosing [place] at [facility] to provide your oncology and hematology care.  To afford each patient quality time with our provider, please arrive at least 15 minutes before your scheduled appointment time.    If you have a lab appointment with the [facility] please come in thru the  Main Entrance and check in at the main information desk  You need to re-schedule your appointment should you arrive 10 or more minutes late.  We strive to give you quality time with our providers, and arriving late affects you and other patients whose appointments are after yours.  Also, if you no show three or more times for appointments you may be dismissed from the clinic at the providers discretion.     Again, thank you for choosing [facility].  Our hope is that these requests will decrease the amount of time that you wait before being seen by our physicians.       _____________________________________________________________  Should you have questions after your visit to [facility], please contact our office at [phone] between the hours of 8:30 a.m. and 4:30 p.m.  Voicemails left after 4:30 p.m. will not be returned until the following business day.  For prescription refill requests, have your pharmacy contact our office.       Resources For Cancer Patients and their Caregivers ? American Cancer Society: Can assist with transportation, wigs, general needs, runs Look Good Feel Better.        219-864-4312 ? Cancer Care: Provides financial assistance, online support groups, medication/co-pay assistance.  1-800-813-HOPE (941)516-3051) ? [place] Assists [place] cancer patients and their families through emotional , educational and financial support.  249-852-6833 ? [place] DSS Where to apply for food stamps, Medicaid and utility assistance. 201-700-2163 ? RCATS: Transportation to medical appointments. [phone] ? Social Security Administration: May apply for disability if have a Stage IV cancer. 919-792-9914 6121153344 ? [employer], Disability and Transit Services: Assists with nutrition, care and transit needs. [place] Support Programs: @10RELATIVEDAYS @ > Cancer Support Group  2nd Tuesday of the month 1pm-2pm, Journey Room  > Creative Journey  3rd Tuesday of the month 1130am-1pm, Journey Room  > Look Good Feel Better  1st Wednesday of the month 10am-12 noon, Journey Room (Call [place] to register [phone])

## 2017-06-05 ENCOUNTER — Encounter: Payer: Self-pay | Admitting: Nurse Practitioner

## 2017-06-05 ENCOUNTER — Ambulatory Visit: Payer: Medicare HMO | Admitting: Nurse Practitioner

## 2017-06-05 VITALS — BP 118/70 | HR 75 | Temp 97.0°F | Ht 62.0 in | Wt 138.0 lb

## 2017-06-05 DIAGNOSIS — F411 Generalized anxiety disorder: Secondary | ICD-10-CM

## 2017-06-05 DIAGNOSIS — E538 Deficiency of other specified B group vitamins: Secondary | ICD-10-CM | POA: Diagnosis not present

## 2017-06-05 DIAGNOSIS — D508 Other iron deficiency anemias: Secondary | ICD-10-CM | POA: Diagnosis not present

## 2017-06-05 DIAGNOSIS — C3491 Malignant neoplasm of unspecified part of right bronchus or lung: Secondary | ICD-10-CM | POA: Diagnosis not present

## 2017-06-05 DIAGNOSIS — Z720 Tobacco use: Secondary | ICD-10-CM | POA: Diagnosis not present

## 2017-06-05 DIAGNOSIS — R413 Other amnesia: Secondary | ICD-10-CM

## 2017-06-05 DIAGNOSIS — E785 Hyperlipidemia, unspecified: Secondary | ICD-10-CM

## 2017-06-05 NOTE — Patient Instructions (Signed)

## 2017-06-05 NOTE — Progress Notes (Signed)
Subjective:    Patient ID: Janet Fletcher, female    DOB: 09-May-1936, 81 y.o.   MRN: [id number]  HPI  KARRAH MANGINI is here today for follow up of chronic medical problem.  Outpatient Encounter Medications as of 06/05/2017  Medication Sig  . acetaminophen (TYLENOL) 500 MG tablet Take 500 mg by mouth every 6 (six) hours as needed.  . donepezil (ARICEPT) 10 MG tablet Take 1 tablet (10 mg total) by mouth at bedtime.   No facility-administered encounter medications on file as of 06/05/2017.     1. Small cell carcinoma of right lung ([place])  Done with all treatments- currently told she was cancer free  2. Hyperlipidemia with target LDL less than 100  Not watching diet  3. GAD (generalized anxiety disorder)  Stays stressed due to medical problems- does not want any meds  4. Tobacco abuse  Stopped smoking when sh egot breast cancer.  5. Other iron deficiency anemia  Sees hematology for infusions monthly  6. Low vitamin B12 level  Gets b12 injections monthly at hematology  7.      Memory disturbance          Is on aricept daily- has helped some according to family  New complaints: None today  Social history: Still lives with her husband     Review of Systems  Constitutional: Negative for activity change and appetite change.  HENT: Negative.   Eyes: Negative for pain.  Respiratory: Negative for shortness of breath.   Cardiovascular: Negative for chest pain, palpitations and leg swelling.  Gastrointestinal: Negative for abdominal pain.  Endocrine: Negative for polydipsia.  Genitourinary: Negative.   Skin: Negative for rash.  Neurological: Negative for dizziness, weakness and headaches.  Hematological: Does not bruise/bleed easily.  Psychiatric/Behavioral: Negative.   All other systems reviewed and are negative.      Objective:   Physical Exam  Constitutional: She is oriented to person, place, and time. She appears well-developed and well-nourished.  HENT:  Nose:  Nose normal.  Mouth/Throat: Oropharynx is clear and moist.  Eyes: EOM are normal.  Neck: Trachea normal, normal range of motion and full passive range of motion without pain. Neck supple. No JVD present. Carotid bruit is not present. No thyromegaly present.  Cardiovascular: Normal rate, regular rhythm, normal heart sounds and intact distal pulses. Exam reveals no gallop and no friction rub.  No murmur heard. Pulmonary/Chest: Effort normal and breath sounds normal.  Abdominal: Soft. Bowel sounds are normal. She exhibits no distension and no mass. There is no tenderness.  Musculoskeletal: Normal range of motion.  Lymphadenopathy:    She has no cervical adenopathy.  Neurological: She is alert and oriented to person, place, and time. She has normal reflexes.  Skin: Skin is warm and dry.  Psychiatric: She has a normal mood and affect. Her behavior is normal. Judgment and thought content normal.   BP 118/70   Pulse 75   Temp (!) 97 F (36.1 C) (Oral)   Ht '5\' 2"'  (1.575 m)   Wt 138 lb (62.6 kg)   BMI 25.24 kg/m       Assessment & Plan:  1. Small cell carcinoma of right lung ([place]) Keep follow up with oncology  2. Hyperlipidemia with target LDL less than 100 Low fat diet - CMP14+EGFR - Lipid panel  3. GAD (generalized anxiety disorder) Stress management  4. Tobacco abuse Do not start smoking again  5. Other iron deficiency anemia Keep follow up with hematology  6. Low vitamin B12 level Keep follow up with hematology  7. Mild memory disturbance Continue aricept    Labs pending Health maintenance reviewed Diet and exercise encouraged Continue all meds Follow up  In 6 months   [place], FNP

## 2017-06-06 ENCOUNTER — Other Ambulatory Visit (HOSPITAL_COMMUNITY): Payer: Self-pay | Admitting: *Deleted

## 2017-06-06 DIAGNOSIS — C3490 Malignant neoplasm of unspecified part of unspecified bronchus or lung: Secondary | ICD-10-CM

## 2017-06-06 LAB — LIPID PANEL
CHOLESTEROL TOTAL: 216 mg/dL — AB (ref 100–199)
Chol/HDL Ratio: 3.2 ratio (ref 0.0–4.4)
HDL: 67 mg/dL (ref >39)
LDL Calculated: 114 mg/dL — ABNORMAL HIGH (ref 0–99)
Triglycerides: 174 mg/dL — ABNORMAL HIGH (ref 0–149)
VLDL CHOLESTEROL CAL: 35 mg/dL (ref 5–40)

## 2017-06-06 LAB — CMP14+EGFR
A/G RATIO: 1.5 (ref 1.2–2.2)
ALK PHOS: 70 IU/L (ref 39–117)
ALT: 7 IU/L (ref 0–32)
AST: 17 IU/L (ref 0–40)
Albumin: 3.9 g/dL (ref 3.5–4.8)
BILIRUBIN TOTAL: 0.5 mg/dL (ref 0.0–1.2)
BUN/Creatinine Ratio: 19 (ref 12–28)
BUN: 21 mg/dL (ref 8–27)
CHLORIDE: 103 mmol/L (ref 96–106)
CO2: 27 mmol/L (ref 20–29)
Calcium: 9.1 mg/dL (ref 8.7–10.3)
Creatinine, Ser: 1.13 mg/dL — ABNORMAL HIGH (ref 0.57–1.00)
GFR calc non Af Amer: 46 mL/min/{1.73_m2} — ABNORMAL LOW (ref >59)
GFR, EST AFRICAN AMERICAN: 53 mL/min/{1.73_m2} — AB (ref >59)
GLUCOSE: 86 mg/dL (ref 65–99)
Globulin, Total: 2.6 g/dL (ref 1.5–4.5)
POTASSIUM: 4 mmol/L (ref 3.5–5.2)
Sodium: 141 mmol/L (ref 134–144)
TOTAL PROTEIN: 6.5 g/dL (ref 6.0–8.5)

## 2017-06-09 ENCOUNTER — Encounter (HOSPITAL_BASED_OUTPATIENT_CLINIC_OR_DEPARTMENT_OTHER): Payer: Medicare HMO

## 2017-06-09 ENCOUNTER — Encounter (HOSPITAL_COMMUNITY): Payer: Medicare HMO

## 2017-06-09 ENCOUNTER — Encounter (HOSPITAL_COMMUNITY): Payer: Self-pay

## 2017-06-09 VITALS — BP 121/61 | HR 79 | Temp 97.8°F | Resp 18 | Wt 138.8 lb

## 2017-06-09 DIAGNOSIS — E538 Deficiency of other specified B group vitamins: Secondary | ICD-10-CM

## 2017-06-09 DIAGNOSIS — D508 Other iron deficiency anemias: Secondary | ICD-10-CM | POA: Diagnosis not present

## 2017-06-09 DIAGNOSIS — C3490 Malignant neoplasm of unspecified part of unspecified bronchus or lung: Secondary | ICD-10-CM

## 2017-06-09 DIAGNOSIS — D649 Anemia, unspecified: Secondary | ICD-10-CM

## 2017-06-09 LAB — CBC WITH DIFFERENTIAL/PLATELET
BASOS ABS: 0 10*3/uL (ref 0.0–0.1)
BASOS PCT: 1 %
EOS ABS: 0.1 10*3/uL (ref 0.0–0.7)
EOS PCT: 3 %
HCT: 32.2 % — ABNORMAL LOW (ref 36.0–46.0)
Hemoglobin: 9.6 g/dL — ABNORMAL LOW (ref 12.0–15.0)
Lymphocytes Relative: 26 %
Lymphs Abs: 0.8 10*3/uL (ref 0.7–4.0)
MCH: 25.5 pg — ABNORMAL LOW (ref 26.0–34.0)
MCHC: 29.8 g/dL — AB (ref 30.0–36.0)
MCV: 85.4 fL (ref 78.0–100.0)
MONO ABS: 0.2 10*3/uL (ref 0.1–1.0)
Monocytes Relative: 7 %
Neutro Abs: 2.1 10*3/uL (ref 1.7–7.7)
Neutrophils Relative %: 64 %
PLATELETS: 87 10*3/uL — AB (ref 150–400)
RBC: 3.77 MIL/uL — AB (ref 3.87–5.11)
RDW: 20.5 % — AB (ref 11.5–15.5)
WBC: 3.3 10*3/uL — AB (ref 4.0–10.5)

## 2017-06-09 LAB — COMPREHENSIVE METABOLIC PANEL
ALBUMIN: 3.3 g/dL — AB (ref 3.5–5.0)
ALT: 8 U/L — AB (ref 14–54)
AST: 18 U/L (ref 15–41)
Alkaline Phosphatase: 64 U/L (ref 38–126)
Anion gap: 5 (ref 5–15)
BUN: 24 mg/dL — AB (ref 6–20)
CHLORIDE: 107 mmol/L (ref 101–111)
CO2: 28 mmol/L (ref 22–32)
CREATININE: 1.23 mg/dL — AB (ref 0.44–1.00)
Calcium: 8.8 mg/dL — ABNORMAL LOW (ref 8.9–10.3)
GFR calc non Af Amer: 41 mL/min — ABNORMAL LOW (ref >60)
GFR, EST AFRICAN AMERICAN: 47 mL/min — AB (ref >60)
GLUCOSE: 106 mg/dL — AB (ref 65–99)
Potassium: 4 mmol/L (ref 3.5–5.1)
SODIUM: 140 mmol/L (ref 135–145)
Total Bilirubin: 0.5 mg/dL (ref 0.3–1.2)
Total Protein: 6.3 g/dL — ABNORMAL LOW (ref 6.5–8.1)

## 2017-06-09 MED ORDER — DARBEPOETIN ALFA 100 MCG/0.5ML IJ SOSY
100.0000 ug | PREFILLED_SYRINGE | Freq: Once | INTRAMUSCULAR | Status: AC
  Administered 2017-06-09: 100 ug via SUBCUTANEOUS
  Filled 2017-06-09: qty 0.5

## 2017-06-09 NOTE — Patient Instructions (Signed)
[facility] at [facility] Discharge Instructions  RECOMMENDATIONS MADE BY THE CONSULTANT AND ANY TEST RESULTS WILL BE SENT TO YOUR REFERRING PHYSICIAN.  Received Aranesp injection today. Follow-up as scheduled. Call clinic for any questions or concerns  Thank you for choosing [facility] at [facility] to provide your oncology and hematology care.  To afford each patient quality time with our provider, please arrive at least 15 minutes before your scheduled appointment time.    If you have a lab appointment with the Cancer Center please come in thru the  Main Entrance and check in at the main information desk  You need to re-schedule your appointment should you arrive 10 or more minutes late.  We strive to give you quality time with our providers, and arriving late affects you and other patients whose appointments are after yours.  Also, if you no show three or more times for appointments you may be dismissed from the clinic at the providers discretion.     Again, thank you for choosing [facility].  Our hope is that these requests will decrease the amount of time that you wait before being seen by our physicians.       _____________________________________________________________  Should you have questions after your visit to [facility], please contact our office at [phone] between the hours of 8:30 a.m. and 4:30 p.m.  Voicemails left after 4:30 p.m. will not be returned until the following business day.  For prescription refill requests, have your pharmacy contact our office.       Resources For Cancer Patients and their Caregivers ? American Cancer Society: Can assist with transportation, wigs, general needs, runs Look Good Feel Better.        1-888-227-6333 ? Cancer Care: Provides financial assistance, online support groups, medication/co-pay assistance.  1-800-813-HOPE (4673) ? [facility] Assists [place] cancer patients and their families through emotional , educational and financial support.  336-427-4357 ? [place] DSS Where to apply for food stamps, Medicaid and utility assistance. 336-342-1394 ? RCATS: Transportation to medical appointments. 336-347-2287 ? Social Security Administration: May apply for disability if have a Stage IV cancer. 336-342-7796 1-800-772-1213 ? [place] Aging, Disability and Transit Services: Assists with nutrition, care and transit needs. 336-349-2343  Cancer Center Support Programs: @10RELATIVEDAYS@ > Cancer Support Group  2nd Tuesday of the month 1pm-2pm, Journey Room  > Creative Journey  3rd Tuesday of the month 1130am-1pm, Journey Room  > Look Good Feel Better  1st Wednesday of the month 10am-12 noon, Journey Room (Call [employer] to register 1-800-395-5775)   

## 2017-06-09 NOTE — Progress Notes (Signed)
Janet Fletcher tolerated Aranesp injection well without complaints or incident. Hgb 9.6 today. VSS Pt discharged self ambulatory in satisfactory condition

## 2017-06-19 ENCOUNTER — Other Ambulatory Visit (HOSPITAL_COMMUNITY): Payer: Self-pay | Admitting: *Deleted

## 2017-06-19 DIAGNOSIS — C3491 Malignant neoplasm of unspecified part of right bronchus or lung: Secondary | ICD-10-CM

## 2017-06-19 DIAGNOSIS — D508 Other iron deficiency anemias: Secondary | ICD-10-CM

## 2017-06-23 ENCOUNTER — Encounter (HOSPITAL_COMMUNITY): Payer: Self-pay

## 2017-06-23 ENCOUNTER — Encounter (HOSPITAL_COMMUNITY): Payer: Medicare HMO | Attending: Oncology

## 2017-06-23 ENCOUNTER — Encounter (HOSPITAL_BASED_OUTPATIENT_CLINIC_OR_DEPARTMENT_OTHER): Payer: Medicare HMO

## 2017-06-23 ENCOUNTER — Other Ambulatory Visit: Payer: Self-pay

## 2017-06-23 VITALS — BP 114/56 | HR 83 | Temp 98.5°F | Resp 16

## 2017-06-23 DIAGNOSIS — C3491 Malignant neoplasm of unspecified part of right bronchus or lung: Secondary | ICD-10-CM

## 2017-06-23 DIAGNOSIS — D649 Anemia, unspecified: Secondary | ICD-10-CM | POA: Diagnosis not present

## 2017-06-23 DIAGNOSIS — C3490 Malignant neoplasm of unspecified part of unspecified bronchus or lung: Secondary | ICD-10-CM | POA: Diagnosis not present

## 2017-06-23 DIAGNOSIS — D508 Other iron deficiency anemias: Secondary | ICD-10-CM | POA: Diagnosis not present

## 2017-06-23 DIAGNOSIS — E538 Deficiency of other specified B group vitamins: Secondary | ICD-10-CM | POA: Diagnosis not present

## 2017-06-23 DIAGNOSIS — Z87891 Personal history of nicotine dependence: Secondary | ICD-10-CM | POA: Insufficient documentation

## 2017-06-23 LAB — COMPREHENSIVE METABOLIC PANEL
ALBUMIN: 3.5 g/dL (ref 3.5–5.0)
ALT: 10 U/L — AB (ref 14–54)
AST: 21 U/L (ref 15–41)
Alkaline Phosphatase: 67 U/L (ref 38–126)
Anion gap: 6 (ref 5–15)
BUN: 22 mg/dL — AB (ref 6–20)
CHLORIDE: 104 mmol/L (ref 101–111)
CO2: 28 mmol/L (ref 22–32)
Calcium: 8.7 mg/dL — ABNORMAL LOW (ref 8.9–10.3)
Creatinine, Ser: 1.39 mg/dL — ABNORMAL HIGH (ref 0.44–1.00)
GFR calc Af Amer: 41 mL/min — ABNORMAL LOW (ref >60)
GFR calc non Af Amer: 35 mL/min — ABNORMAL LOW (ref >60)
GLUCOSE: 103 mg/dL — AB (ref 65–99)
POTASSIUM: 4.2 mmol/L (ref 3.5–5.1)
Sodium: 138 mmol/L (ref 135–145)
TOTAL PROTEIN: 6.5 g/dL (ref 6.5–8.1)
Total Bilirubin: 0.9 mg/dL (ref 0.3–1.2)

## 2017-06-23 LAB — CBC WITH DIFFERENTIAL/PLATELET
BASOS ABS: 0 10*3/uL (ref 0.0–0.1)
Basophils Relative: 1 %
EOS ABS: 0.1 10*3/uL (ref 0.0–0.7)
EOS PCT: 2 %
HCT: 31.4 % — ABNORMAL LOW (ref 36.0–46.0)
Hemoglobin: 9.3 g/dL — ABNORMAL LOW (ref 12.0–15.0)
LYMPHS ABS: 0.9 10*3/uL (ref 0.7–4.0)
LYMPHS PCT: 30 %
MCH: 24.9 pg — ABNORMAL LOW (ref 26.0–34.0)
MCHC: 29.6 g/dL — ABNORMAL LOW (ref 30.0–36.0)
MCV: 84.2 fL (ref 78.0–100.0)
Monocytes Absolute: 0.2 10*3/uL (ref 0.1–1.0)
Monocytes Relative: 8 %
NEUTROS PCT: 58 %
Neutro Abs: 1.7 10*3/uL (ref 1.7–7.7)
PLATELETS: 107 10*3/uL — AB (ref 150–400)
RBC: 3.73 MIL/uL — AB (ref 3.87–5.11)
RDW: 20.6 % — AB (ref 11.5–15.5)
WBC: 2.9 10*3/uL — AB (ref 4.0–10.5)

## 2017-06-23 MED ORDER — CYANOCOBALAMIN 1000 MCG/ML IJ SOLN
1000.0000 ug | Freq: Once | INTRAMUSCULAR | Status: AC
  Administered 2017-06-23: 1000 ug via INTRAMUSCULAR

## 2017-06-23 MED ORDER — CYANOCOBALAMIN 1000 MCG/ML IJ SOLN
INTRAMUSCULAR | Status: AC
  Filled 2017-06-23: qty 1

## 2017-06-23 MED ORDER — DARBEPOETIN ALFA 100 MCG/0.5ML IJ SOSY
100.0000 ug | PREFILLED_SYRINGE | Freq: Once | INTRAMUSCULAR | Status: AC
  Administered 2017-06-23: 100 ug via SUBCUTANEOUS

## 2017-06-23 MED ORDER — DARBEPOETIN ALFA 100 MCG/0.5ML IJ SOSY
PREFILLED_SYRINGE | INTRAMUSCULAR | Status: AC
  Filled 2017-06-23: qty 0.5

## 2017-06-23 NOTE — Progress Notes (Signed)
Pt here today for B12 injection and Aranesp injection. Pt given both injections and tolerated well. Pt stable and discharged home ambulatory with family.

## 2017-07-02 ENCOUNTER — Ambulatory Visit (HOSPITAL_COMMUNITY)
Admission: RE | Admit: 2017-07-02 | Discharge: 2017-07-02 | Disposition: A | Discharge status: Home / Self Care | Payer: Medicare HMO | Source: Ambulatory Visit | Attending: Oncology | Admitting: Oncology

## 2017-07-02 DIAGNOSIS — C3491 Malignant neoplasm of unspecified part of right bronchus or lung: Secondary | ICD-10-CM | POA: Diagnosis not present

## 2017-07-02 DIAGNOSIS — I700 Atherosclerosis of aorta: Secondary | ICD-10-CM | POA: Insufficient documentation

## 2017-07-02 DIAGNOSIS — J439 Emphysema, unspecified: Secondary | ICD-10-CM | POA: Insufficient documentation

## 2017-07-02 DIAGNOSIS — Z5111 Encounter for antineoplastic chemotherapy: Secondary | ICD-10-CM | POA: Diagnosis not present

## 2017-07-02 MED ORDER — IOPAMIDOL (ISOVUE-300) INJECTION 61%
75.0000 mL | Freq: Once | INTRAVENOUS | Status: AC | PRN
  Administered 2017-07-02: 60 mL via INTRAVENOUS

## 2017-07-07 ENCOUNTER — Encounter (HOSPITAL_COMMUNITY): Payer: Self-pay | Admitting: Oncology

## 2017-07-07 ENCOUNTER — Other Ambulatory Visit (HOSPITAL_COMMUNITY): Payer: Self-pay | Admitting: Oncology

## 2017-07-07 ENCOUNTER — Other Ambulatory Visit: Payer: Self-pay

## 2017-07-07 ENCOUNTER — Encounter (HOSPITAL_BASED_OUTPATIENT_CLINIC_OR_DEPARTMENT_OTHER): Payer: Medicare HMO

## 2017-07-07 ENCOUNTER — Encounter (HOSPITAL_COMMUNITY): Payer: Medicare HMO

## 2017-07-07 ENCOUNTER — Encounter (HOSPITAL_BASED_OUTPATIENT_CLINIC_OR_DEPARTMENT_OTHER): Payer: Medicare HMO | Admitting: Oncology

## 2017-07-07 VITALS — BP 114/52 | HR 71 | Temp 97.6°F | Resp 16 | Ht 66.0 in | Wt 139.0 lb

## 2017-07-07 DIAGNOSIS — C3490 Malignant neoplasm of unspecified part of unspecified bronchus or lung: Secondary | ICD-10-CM | POA: Diagnosis not present

## 2017-07-07 DIAGNOSIS — Z87891 Personal history of nicotine dependence: Secondary | ICD-10-CM | POA: Diagnosis not present

## 2017-07-07 DIAGNOSIS — D508 Other iron deficiency anemias: Secondary | ICD-10-CM | POA: Diagnosis not present

## 2017-07-07 DIAGNOSIS — D72819 Decreased white blood cell count, unspecified: Secondary | ICD-10-CM

## 2017-07-07 DIAGNOSIS — D61818 Other pancytopenia: Secondary | ICD-10-CM

## 2017-07-07 DIAGNOSIS — C3491 Malignant neoplasm of unspecified part of right bronchus or lung: Secondary | ICD-10-CM

## 2017-07-07 DIAGNOSIS — D649 Anemia, unspecified: Secondary | ICD-10-CM | POA: Diagnosis not present

## 2017-07-07 DIAGNOSIS — D696 Thrombocytopenia, unspecified: Secondary | ICD-10-CM

## 2017-07-07 DIAGNOSIS — E538 Deficiency of other specified B group vitamins: Secondary | ICD-10-CM

## 2017-07-07 LAB — IRON AND TIBC
Iron: 28 ug/dL (ref 28–170)
SATURATION RATIOS: 9 % — AB (ref 10.4–31.8)
TIBC: 305 ug/dL (ref 250–450)
UIBC: 277 ug/dL

## 2017-07-07 LAB — COMPREHENSIVE METABOLIC PANEL
ALBUMIN: 3.3 g/dL — AB (ref 3.5–5.0)
ALT: 10 U/L — AB (ref 14–54)
AST: 17 U/L (ref 15–41)
Alkaline Phosphatase: 69 U/L (ref 38–126)
Anion gap: 5 (ref 5–15)
BUN: 22 mg/dL — AB (ref 6–20)
CHLORIDE: 106 mmol/L (ref 101–111)
CO2: 28 mmol/L (ref 22–32)
CREATININE: 1.18 mg/dL — AB (ref 0.44–1.00)
Calcium: 8.8 mg/dL — ABNORMAL LOW (ref 8.9–10.3)
GFR calc Af Amer: 49 mL/min — ABNORMAL LOW (ref >60)
GFR, EST NON AFRICAN AMERICAN: 43 mL/min — AB (ref >60)
GLUCOSE: 99 mg/dL (ref 65–99)
Potassium: 3.8 mmol/L (ref 3.5–5.1)
Sodium: 139 mmol/L (ref 135–145)
Total Bilirubin: 0.4 mg/dL (ref 0.3–1.2)
Total Protein: 6.4 g/dL — ABNORMAL LOW (ref 6.5–8.1)

## 2017-07-07 LAB — CBC WITH DIFFERENTIAL/PLATELET
BASOS ABS: 0 10*3/uL (ref 0.0–0.1)
Basophils Relative: 0 %
EOS ABS: 0.1 10*3/uL (ref 0.0–0.7)
Eosinophils Relative: 3 %
HCT: 30.1 % — ABNORMAL LOW (ref 36.0–46.0)
Hemoglobin: 9 g/dL — ABNORMAL LOW (ref 12.0–15.0)
LYMPHS ABS: 0.8 10*3/uL (ref 0.7–4.0)
LYMPHS PCT: 26 %
MCH: 24.9 pg — ABNORMAL LOW (ref 26.0–34.0)
MCHC: 29.9 g/dL — ABNORMAL LOW (ref 30.0–36.0)
MCV: 83.1 fL (ref 78.0–100.0)
Monocytes Absolute: 0.2 10*3/uL (ref 0.1–1.0)
Monocytes Relative: 6 %
NEUTROS PCT: 64 %
Neutro Abs: 1.9 10*3/uL (ref 1.7–7.7)
PLATELETS: 93 10*3/uL — AB (ref 150–400)
RBC: 3.62 MIL/uL — AB (ref 3.87–5.11)
RDW: 20.4 % — ABNORMAL HIGH (ref 11.5–15.5)
WBC: 3 10*3/uL — AB (ref 4.0–10.5)

## 2017-07-07 LAB — FERRITIN: Ferritin: 34 ng/mL (ref 11–307)

## 2017-07-07 LAB — FOLATE: FOLATE: 12.2 ng/mL (ref >5.9)

## 2017-07-07 LAB — VITAMIN B12: VITAMIN B 12: 775 pg/mL (ref 180–914)

## 2017-07-07 MED ORDER — DARBEPOETIN ALFA 200 MCG/0.4ML IJ SOSY
200.0000 ug | PREFILLED_SYRINGE | Freq: Once | INTRAMUSCULAR | Status: AC
  Administered 2017-07-07: 200 ug via SUBCUTANEOUS

## 2017-07-07 MED ORDER — DARBEPOETIN ALFA 200 MCG/0.4ML IJ SOSY
PREFILLED_SYRINGE | INTRAMUSCULAR | Status: AC
  Filled 2017-07-07: qty 0.4

## 2017-07-07 NOTE — Progress Notes (Addendum)
[place], [place] [address]   CLINIC:  Medical Oncology/Hematology  PCP:  Chevis Pretty, [address] [place] [address] [phone]   REASON FOR VISIT:  Follow-up for limited stage small cell lung cancer AND Anemia   CURRENT THERAPY: Surveillance via NCCN Guidelines AND Aranesp injections every 2 weeks & B12 injections monthly    BRIEF ONCOLOGIC HISTORY:    Small cell carcinoma of right lung ([place])   11/28/2015 Imaging    Large anterior mediastinal mass in continuity with a R hilar mass, narrowing of SVC, abnormal densities in RUL, 9 mm spiculated nodule, nonspec. hypodensity in liver      12/05/2015 PET scan    Large hypermetabolic paratracheal mass c/w SCLC, perihilar nodular densities in RML, mild metabolic activity RUL nodule, no distant metastatic disease      12/11/2015 Procedure    Video bronch with biopsies and brushings, endobronchial ultrasound with mediastinal LN aspiration. Dr. Roxan Hockey      12/11/2015 Pathology Results    Trachea biopsy negative, FNA RUL malignant cells c/w SCLC      12/13/2015 - 03/15/2016 Chemotherapy    Cisplatin/Etoposide x 5 cycles.  Cycle #6 cancelled due to significant cytopenias      12/25/2015 Imaging    MRI brain- No intracranial parenchymal enhancing lesion. Tiny right frontal calvarial enhancing lesion. Small metastatic lesion not excluded although this may represent an incidentally detected benign process.      12/29/2015 - 02/13/2016 Radiation Therapy    XRT      02/14/2016 Treatment Plan Change    Chemotherapy deferred x 1 week      02/21/2016 Treatment Plan Change    Carboplatin dose reduced by 20% and Etoposide dose reduced by 10%.      04/24/2016 Imaging    CT CAP- Chest Impression:  1. Marked reduction in mediastinal and RIGHT hilar lymphadenopathy with no residual pathologically enlarged nodes. 2. Resolution of perihilar nodularity in the RIGHT lung which  was hypermetabolic on FDG PET. 3. Persistent nodule in the RIGHT upper lobe which is not hypermetabolic. 4. New ground-glass nodule in the RIGHT upper lobe is nonspecific and may relate to therapy.  Abdomen / Pelvis Impression:  1. No evidence of metastatic disease in the abdomen pelvis. 2. Atherosclerotic calcification of the aorta.      04/24/2016 Imaging    Bone scan- Nonspecific increased tracer localization at approximately T11 vertebral body; this is of uncertain etiology, could represent a degenerative process though metastatic disease is not excluded.      04/29/2016 -  Radiation Therapy    PCI -- 25 Gy      07/24/2016 Imaging    CT CAP- 1. There has been interval decrease in size of mediastinal lymph nodes. The index lesion within the right upper lobe is also decreased in size from previous exam. 2. New subpleural nodule is noted within the posteromedial right lower lobe. Attention on follow-up imaging advised. 3. Stable ground-glass attenuating nodule within the right lower lobe and anteromedial left upper lobe 4. Aortic atherosclerosis and coronary artery calcification      07/29/2016 Imaging    MRI t-spine: 1. Stable benign slightly enhancing sclerotic lesion in the right posterolateral aspect of the T11 vertebral body, unchanged for 5 years. This is felt to account for the subtle increased activity at T11 on the SPECT bone scan. 2. Slight degenerative disc disease with small disc protrusions at T1-2 and T2-3 and T12-L1 with  no neural impingement. 3. No evidence of metastatic disease.      09/23/2016 Imaging    CT chest- Radiation changes in the central right upper lobe/ perihilar region.  Stable small mediastinal lymph nodes, unchanged.  No findings suspicious for new/progressive metastatic disease.      12/25/2016 Imaging    CT C/A/P: IMPRESSION: 1. Continued further decrease in mediastinal lymph nodes. 2. Index nodule antero medial right upper lobe has  decreased. The new 5 mm right lower lobe subpleural nodule seen on the previous study has resolved in the interval. Ground-glass nodules in the upper lobes bilaterally are stable. 3. Interval development of interstitial and alveolar opacity in the parahilar right lung is presumably radiation related. Attention on follow-up recommended. 4. New tiny right pleural effusion with areas of apparent pleural enhancement in the posterior right costophrenic sulcus. Close attention on follow-up recommended. 5. Marked abdominal aortic atherosclerosis with likely infrarenal significant stenosis.         INTERVAL HISTORY:  Janet Fletcher 81 y.o. female returns to cancer center for routine follow-up for limited stage SCLC and anemia.    She states that overall she has been doing well.  She denies any chest pain, shortness of breath, abdominal pain, nausea, vomiting, diarrhea, focal weakness.  She denies any bleeding including melena, hematochezia, hematuria, hemoptysis.   REVIEW OF SYSTEMS:  Review of Systems  Constitutional: Positive for fatigue. Negative for appetite change, chills and fever.  HENT:  Negative.   Eyes: Negative.  Negative for eye problems.  Respiratory: Negative.  Negative for cough and shortness of breath.   Cardiovascular: Negative.  Negative for chest pain and leg swelling.  Gastrointestinal: Negative.  Negative for abdominal pain, blood in stool, constipation, diarrhea, nausea and vomiting.  Endocrine: Negative.   Genitourinary: Negative.  Negative for dysuria, hematuria and vaginal bleeding.   Musculoskeletal: Negative.   Skin: Negative.   Neurological: Negative.  Negative for dizziness, headaches and light-headedness.  Hematological: Negative.   Psychiatric/Behavioral: Negative.  Negative for depression and sleep disturbance. The patient is not nervous/anxious.      PAST MEDICAL/SURGICAL HISTORY:  Past Medical History:  Diagnosis Date  . Allergy   . Cancer ([place])      lung  . Hyperlipidemia   . Iron deficiency anemia 12/15/2015  . Low vitamin B12 level 12/15/2015  . Osteopenia    Past Surgical History:  Procedure Laterality Date  . CATARACT EXTRACTION, BILATERAL Bilateral   . COLONOSCOPY N/A 12/06/2016   Procedure: COLONOSCOPY;  Surgeon: Danie Binder, MD;  [facility];  Service: Endoscopy;  Laterality: N/A;  1:45pm  . ESOPHAGOGASTRODUODENOSCOPY N/A 12/06/2016   Procedure: ESOPHAGOGASTRODUODENOSCOPY (EGD);  Surgeon: Danie Binder, MD;  Location: [facility];  Service: Endoscopy;  Laterality: N/A;  . MINOR EXCISION EAR CANAL CYST Left    30+ years ago  . SMALL INTESTINE SURGERY    . VIDEO BRONCHOSCOPY WITH ENDOBRONCHIAL ULTRASOUND N/A 12/11/2015   Procedure: VIDEO BRONCHOSCOPY WITH ENDOBRONCHIAL ULTRASOUND;  Surgeon: Mel[place] Nakayama, MD;  Location: [facility];  Service: Thoracic;  Laterality: N/A;     SOCIAL HISTORY:  Social History   Socioeconomic History  . Marital status: Married    Spouse name: Not on file  . Number of children: Not on file  . Years of education: Not on file  . Highest education level: Not on file  Social Needs  . Financial resource strain: Not on file  . Food insecurity - worry: Not on file  .  Food insecurity - inability: Not on file  . Transportation needs - medical: Not on file  . Transportation needs - non-medical: Not on file  Occupational History  . Not on file  Tobacco Use  . Smoking status: Former Smoker    Packs/day: 0.25    Years: 50.00    Pack years: 12.50    Last attempt to quit: 01/08/2016    Years since quitting: 1.4  . Smokeless tobacco: Never Used  Substance and Sexual Activity  . Alcohol use: No  . Drug use: No  . Sexual activity: Not Currently  Other Topics Concern  . Not on file  Social History Narrative  . Not on file    FAMILY HISTORY:  Family History  Problem Relation Age of Onset  . Cancer Mother   . Hypertension Father   . Colon cancer Neg Hx     CURRENT  MEDICATIONS:  Outpatient Encounter Medications as of 07/07/2017  Medication Sig  . donepezil (ARICEPT) 10 MG tablet Take 1 tablet (10 mg total) by mouth at bedtime.  . [EXPIRED] Darbepoetin Alfa (ARANESP) injection 200 mcg    No facility-administered encounter medications on file as of 07/07/2017.     ALLERGIES:  Allergies  Allergen Reactions  . Acetaminophen Other (See Comments)    HEART RACES AWAY/ EXTRA STRENGTH TYLENOL  . Flagyl [Metronidazole Hcl] Nausea Only  . Penicillins Other (See Comments)    Has patient had a PCN reaction causing immediate rash, facial/tongue/throat swelling, SOB or lightheadedness with hypotension: unknown Has patient had a PCN reaction causing severe rash involving mucus membranes or skin necrosis: unknown Has patient had a PCN reaction that required hospitalization unknown Has patient had a PCN reaction occurring within the last 10 years: unknown  If all of the above answers are "NO", then may proceed with Cephalosporin use.     PHYSICAL EXAM:  ECOG Performance status: 1-2 - Symptomatic; requires occasional assistance.   Vitals:   07/07/17 1017  BP: (!) 114/52  Pulse: 71  Resp: 16  Temp: 97.6 F (36.4 C)  SpO2: 99%     Physical Exam  Constitutional: She is oriented to person, place, and time and well-developed, well-nourished, and in no distress.  HENT:  Head: Normocephalic.  Mouth/Throat: Oropharynx is clear and moist. No oropharyngeal exudate.  Eyes: Conjunctivae are normal. Pupils are equal, round, and reactive to light. No scleral icterus.  Neck: Normal range of motion. Neck supple.  Cardiovascular: Normal rate, regular rhythm and normal heart sounds.  Pulmonary/Chest: Effort normal and breath sounds normal. No respiratory distress.  Abdominal: Soft. Bowel sounds are normal. There is no tenderness.  Musculoskeletal: Normal range of motion. She exhibits no edema.  Lymphadenopathy:    She has no cervical adenopathy.       Right: No  supraclavicular adenopathy present.       Left: No supraclavicular adenopathy present.  Neurological: She is alert and oriented to person, place, and time. No cranial nerve deficit. Gait normal.  Skin: Skin is warm and dry. No rash noted. No pallor.  Psychiatric: Mood, memory, affect and judgment normal.  Nursing note and vitals reviewed.    LABORATORY DATA:  I have reviewed the labs as listed.  CBC    Component Value Date/Time   WBC 3.0 (L) 07/07/2017 0922   RBC 3.62 (L) 07/07/2017 0922   HGB 9.0 (L) 07/07/2017 0922   HCT 30.1 (L) 07/07/2017 0922   PLT 93 (L) 07/07/2017 0922   MCV 83.1 07/07/2017 6579  MCV 71.9 (A) 08/20/2013 1559   MCH 24.9 (L) 07/07/2017 0922   MCHC 29.9 (L) 07/07/2017 0922   RDW 20.4 (H) 07/07/2017 0922   LYMPHSABS 0.8 07/07/2017 0922   MONOABS 0.2 07/07/2017 0922   EOSABS 0.1 07/07/2017 0922   BASOSABS 0.0 07/07/2017 0922   CMP Latest Ref Rng & Units 07/07/2017 06/23/2017 06/09/2017  Glucose 65 - 99 mg/dL 99 103(H) 106(H)  BUN 6 - 20 mg/dL 22(H) 22(H) 24(H)  Creatinine 0.44 - 1.00 mg/dL 1.18(H) 1.39(H) 1.23(H)  Sodium 135 - 145 mmol/L 139 138 140  Potassium 3.5 - 5.1 mmol/L 3.8 4.2 4.0  Chloride 101 - 111 mmol/L 106 104 107  CO2 22 - 32 mmol/L '28 28 28  ' Calcium 8.9 - 10.3 mg/dL 8.8(L) 8.7(L) 8.8(L)  Total Protein 6.5 - 8.1 g/dL 6.4(L) 6.5 6.3(L)  Total Bilirubin 0.3 - 1.2 mg/dL 0.4 0.9 0.5  Alkaline Phos 38 - 126 U/L 69 67 64  AST 15 - 41 U/L '17 21 18  ' ALT 14 - 54 U/L 10(L) 10(L) 8(L)    PENDING LABS:    DIAGNOSTIC IMAGING:  Most recent CT chest: 09/23/16     PATHOLOGY:  FNA path: 12/11/15     ASSESSMENT & PLAN:   Limited stage small cell lung cancer:  -restaging CT chest stable with no findings suggestive of recurrence or metastatic disease. -Plan to repeat her next restaging scans in 6 months.    Pancytopenia -Discussed lab work in detail today. Her pancytopenia has been stable.  -Continue Aranesp q2 weeks, hold for hemoglobin  >11 g/dL. Her hemoglobin has been in the 9 range in the past 2 months, will increase her aranesp dose to 259mg q2 weeks. -Continue monthly B12 injections.  -Leukopenia and thrombocytopenia stable. If pancytopenia worsens in the future may want to consider bone marrow biopsy to rule out MDS.   Dispo:  -Labs every 2 weeks with Aranesp injections.  -B12 injections monthly.  -Return to cancer center in 3 months for follow-up visit.  Orders Placed This Encounter  Procedures  . CBC with Differential    Standing Status:   Future    Standing Expiration Date:   07/07/2018  . Comprehensive metabolic panel    Standing Status:   Future    Standing Expiration Date:   07/07/2018  . Vitamin B12    Standing Status:   Future    Standing Expiration Date:   07/07/2018  . Iron and TIBC    Standing Status:   Future    Standing Expiration Date:   07/07/2018  . Ferritin    Standing Status:   Future    Standing Expiration Date:   07/07/2018     All questions were answered to patient's stated satisfaction. Encouraged patient to call with any new concerns or questions before her next visit to the cancer center and we can certain see her sooner, if needed.     LTwana First MD

## 2017-07-07 NOTE — Patient Instructions (Signed)
[place] at [facility] Discharge Instructions  RECOMMENDATIONS MADE BY THE CONSULTANT AND ANY TEST RESULTS WILL BE SENT TO YOUR REFERRING PHYSICIAN.  You had your Aranesp today. We increased your dose but you will still get it every 2 weeks We also taught your family how to give your B-12 injections starting on January 1.  You call us should you have any questions.   Follow up in 2 weeks.  Thank you for choosing [place] at [facility] to provide your oncology and hematology care.  To afford each patient quality time with our provider, please arrive at least 15 minutes before your scheduled appointment time.    If you have a lab appointment with the [facility] please come in thru the  Main Entrance and check in at the main information desk  You need to re-schedule your appointment should you arrive 10 or more minutes late.  We strive to give you quality time with our providers, and arriving late affects you and other patients whose appointments are after yours.  Also, if you no show three or more times for appointments you may be dismissed from the clinic at the providers discretion.     Again, thank you for choosing [facility].  Our hope is that these requests will decrease the amount of time that you wait before being seen by our physicians.       _____________________________________________________________  Should you have questions after your visit to [facility], please contact our office at [phone] between the hours of 8:30 a.m. and 4:30 p.m.  Voicemails left after 4:30 p.m. will not be returned until the following business day.  For prescription refill requests, have your pharmacy contact our office.       Resources For Cancer Patients and their Caregivers ? American Cancer Society: Can assist with transportation, wigs, general needs, runs Look Good Feel Better.        (716)073-6475 ? Cancer  Care: Provides financial assistance, online support groups, medication/co-pay assistance.  1-800-813-HOPE 832-036-8734) ? [facility] Assists [place] cancer patients and their families through emotional , educational and financial support.  (825)487-4848 ? [place] DSS Where to apply for food stamps, Medicaid and utility assistance. (224) 111-8969 ? RCATS: Transportation to medical appointments. 670-455-5869 ? Social Security Administration: May apply for disability if have a Stage IV cancer. (431)339-7155 531 848 9771 ? [employer], Disability and Transit Services: Assists with nutrition, care and transit needs. [place] Support Programs: @10RELATIVEDAYS @ > Cancer Support Group  2nd Tuesday of the month 1pm-2pm, Journey Room  > Creative Journey  3rd Tuesday of the month 1130am-1pm, Journey Room  > Look Good Feel Better  1st Wednesday of the month 10am-12 noon, Journey Room (Call [facility] to register [phone])

## 2017-07-08 LAB — ERYTHROPOIETIN: ERYTHROPOIETIN: 25.8 m[IU]/mL — AB (ref 2.6–18.5)

## 2017-07-11 ENCOUNTER — Encounter (HOSPITAL_COMMUNITY): Payer: Self-pay

## 2017-07-11 ENCOUNTER — Encounter (HOSPITAL_BASED_OUTPATIENT_CLINIC_OR_DEPARTMENT_OTHER): Payer: Medicare HMO

## 2017-07-11 VITALS — BP 124/55 | HR 83 | Temp 98.2°F | Resp 18

## 2017-07-11 DIAGNOSIS — E538 Deficiency of other specified B group vitamins: Secondary | ICD-10-CM

## 2017-07-11 DIAGNOSIS — D649 Anemia, unspecified: Secondary | ICD-10-CM | POA: Diagnosis not present

## 2017-07-11 MED ORDER — SODIUM CHLORIDE 0.9 % IV SOLN
INTRAVENOUS | Status: DC
  Administered 2017-07-11: 12:00:00 via INTRAVENOUS

## 2017-07-11 MED ORDER — SODIUM CHLORIDE 0.9 % IV SOLN
750.0000 mg | Freq: Once | INTRAVENOUS | Status: AC
  Administered 2017-07-11: 750 mg via INTRAVENOUS
  Filled 2017-07-11: qty 15

## 2017-07-11 MED ORDER — SODIUM CHLORIDE 0.9% FLUSH
10.0000 mL | INTRAVENOUS | Status: DC | PRN
  Administered 2017-07-11: 10 mL via INTRAVENOUS
  Filled 2017-07-11: qty 10

## 2017-07-11 MED ORDER — HEPARIN SOD (PORK) LOCK FLUSH 100 UNIT/ML IV SOLN
500.0000 [IU] | Freq: Once | INTRAVENOUS | Status: AC
  Administered 2017-07-11: 500 [IU] via INTRAVENOUS

## 2017-07-11 NOTE — Patient Instructions (Signed)
[facility] at [facility] Discharge Instructions  RECOMMENDATIONS MADE BY THE CONSULTANT AND ANY TEST RESULTS WILL BE SENT TO YOUR REFERRING PHYSICIAN.  Received Injectafer infusion today. Follow-up as scheduled. Call clinic for any questions or concerns  Thank you for choosing  [facility] Center at [facility] to provide your oncology and hematology care.  To afford each patient quality time with our provider, please arrive at least 15 minutes before your scheduled appointment time.    If you have a lab appointment with the Cancer Center please come in thru the  Main Entrance and check in at the main information desk  You need to re-schedule your appointment should you arrive 10 or more minutes late.  We strive to give you quality time with our providers, and arriving late affects you and other patients whose appointments are after yours.  Also, if you no show three or more times for appointments you may be dismissed from the clinic at the providers discretion.     Again, thank you for choosing [facility].  Our hope is that these requests will decrease the amount of time that you wait before being seen by our physicians.       _____________________________________________________________  Should you have questions after your visit to [facility], please contact our office at [phone] between the hours of 8:30 a.m. and 4:30 p.m.  Voicemails left after 4:30 p.m. will not be returned until the following business day.  For prescription refill requests, have your pharmacy contact our office.       Resources For Cancer Patients and their Caregivers ? American Cancer Society: Can assist with transportation, wigs, general needs, runs Look Good Feel Better.        1-888-227-6333 ? Cancer Care: Provides financial assistance, online support groups, medication/co-pay assistance.  1-800-813-HOPE (4673) ? [facility] Assists [place] cancer patients and their families through emotional , educational and financial support.  336-427-4357 ? [place] DSS Where to apply for food stamps, Medicaid and utility assistance. 336-342-1394 ? RCATS: Transportation to medical appointments. 336-347-2287 ? Social Security Administration: May apply for disability if have a Stage IV cancer. 336-342-7796 1-800-772-1213 ? [place] Aging, Disability and Transit Services: Assists with nutrition, care and transit needs. 336-349-2343  Cancer Center Support Programs: @10RELATIVEDAYS@ > Cancer Support Group  2nd Tuesday of the month 1pm-2pm, Journey Room  > Creative Journey  3rd Tuesday of the month 1130am-1pm, Journey Room  > Look Good Feel Better  1st Wednesday of the month 10am-12 noon, Journey Room (Call American Cancer Society to register 1-800-395-5775)   

## 2017-07-11 NOTE — Progress Notes (Signed)
Janet Fletcher Pant tolerated Injectafer infusion well without complaints or incident. VSS upon discharge. Pt discharged self ambulatory in satisfactory condition accompanied by her daughter

## 2017-07-18 ENCOUNTER — Other Ambulatory Visit (HOSPITAL_COMMUNITY): Payer: Self-pay | Admitting: *Deleted

## 2017-07-18 DIAGNOSIS — D508 Other iron deficiency anemias: Secondary | ICD-10-CM

## 2017-07-21 ENCOUNTER — Encounter (HOSPITAL_BASED_OUTPATIENT_CLINIC_OR_DEPARTMENT_OTHER): Payer: Medicare HMO

## 2017-07-21 ENCOUNTER — Encounter (HOSPITAL_COMMUNITY): Payer: Medicare HMO

## 2017-07-21 ENCOUNTER — Encounter (HOSPITAL_COMMUNITY): Payer: Self-pay

## 2017-07-21 VITALS — BP 114/61 | HR 78 | Temp 97.5°F | Resp 18 | Wt 138.3 lb

## 2017-07-21 DIAGNOSIS — C3490 Malignant neoplasm of unspecified part of unspecified bronchus or lung: Secondary | ICD-10-CM | POA: Diagnosis not present

## 2017-07-21 DIAGNOSIS — D508 Other iron deficiency anemias: Secondary | ICD-10-CM | POA: Diagnosis not present

## 2017-07-21 DIAGNOSIS — E538 Deficiency of other specified B group vitamins: Secondary | ICD-10-CM

## 2017-07-21 DIAGNOSIS — D649 Anemia, unspecified: Secondary | ICD-10-CM | POA: Diagnosis not present

## 2017-07-21 DIAGNOSIS — Z87891 Personal history of nicotine dependence: Secondary | ICD-10-CM | POA: Diagnosis not present

## 2017-07-21 LAB — CBC WITH DIFFERENTIAL/PLATELET
BASOS ABS: 0 10*3/uL (ref 0.0–0.1)
BASOS PCT: 1 %
Eosinophils Absolute: 0.1 10*3/uL (ref 0.0–0.7)
Eosinophils Relative: 3 %
HEMATOCRIT: 32 % — AB (ref 36.0–46.0)
HEMOGLOBIN: 9.5 g/dL — AB (ref 12.0–15.0)
LYMPHS PCT: 24 %
Lymphs Abs: 0.8 10*3/uL (ref 0.7–4.0)
MCH: 25.2 pg — ABNORMAL LOW (ref 26.0–34.0)
MCHC: 29.7 g/dL — ABNORMAL LOW (ref 30.0–36.0)
MCV: 84.9 fL (ref 78.0–100.0)
Monocytes Absolute: 0.4 10*3/uL (ref 0.1–1.0)
Monocytes Relative: 12 %
NEUTROS ABS: 2 10*3/uL (ref 1.7–7.7)
NEUTROS PCT: 60 %
Platelets: 91 10*3/uL — ABNORMAL LOW (ref 150–400)
RBC: 3.77 MIL/uL — AB (ref 3.87–5.11)
RDW: 22.3 % — ABNORMAL HIGH (ref 11.5–15.5)
WBC: 3.3 10*3/uL — ABNORMAL LOW (ref 4.0–10.5)

## 2017-07-21 LAB — COMPREHENSIVE METABOLIC PANEL
ALBUMIN: 3.4 g/dL — AB (ref 3.5–5.0)
ALK PHOS: 72 U/L (ref 38–126)
ALT: 11 U/L — AB (ref 14–54)
AST: 19 U/L (ref 15–41)
Anion gap: 9 (ref 5–15)
BILIRUBIN TOTAL: 0.6 mg/dL (ref 0.3–1.2)
BUN: 26 mg/dL — AB (ref 6–20)
CALCIUM: 8.6 mg/dL — AB (ref 8.9–10.3)
CO2: 25 mmol/L (ref 22–32)
CREATININE: 1.33 mg/dL — AB (ref 0.44–1.00)
Chloride: 107 mmol/L (ref 101–111)
GFR calc Af Amer: 43 mL/min — ABNORMAL LOW (ref >60)
GFR calc non Af Amer: 37 mL/min — ABNORMAL LOW (ref >60)
GLUCOSE: 82 mg/dL (ref 65–99)
POTASSIUM: 4.1 mmol/L (ref 3.5–5.1)
Sodium: 141 mmol/L (ref 135–145)
TOTAL PROTEIN: 6.5 g/dL (ref 6.5–8.1)

## 2017-07-21 MED ORDER — DARBEPOETIN ALFA 200 MCG/0.4ML IJ SOSY
200.0000 ug | PREFILLED_SYRINGE | Freq: Once | INTRAMUSCULAR | Status: AC
  Administered 2017-07-21: 200 ug via SUBCUTANEOUS

## 2017-07-21 MED ORDER — DARBEPOETIN ALFA 200 MCG/0.4ML IJ SOSY
PREFILLED_SYRINGE | INTRAMUSCULAR | Status: AC
  Filled 2017-07-21: qty 0.4

## 2017-07-21 NOTE — Patient Instructions (Signed)
[facility] at [facility] Discharge Instructions  RECOMMENDATIONS MADE BY THE CONSULTANT AND ANY TEST RESULTS WILL BE SENT TO YOUR REFERRING PHYSICIAN.  Received Aranesp injection today. Follow-up as scheduled. Call clinic for any questions or concerns  Thank you for choosing [facility] at [facility] to provide your oncology and hematology care.  To afford each patient quality time with our provider, please arrive at least 15 minutes before your scheduled appointment time.    If you have a lab appointment with the Cancer Center please come in thru the  Main Entrance and check in at the main information desk  You need to re-schedule your appointment should you arrive 10 or more minutes late.  We strive to give you quality time with our providers, and arriving late affects you and other patients whose appointments are after yours.  Also, if you no show three or more times for appointments you may be dismissed from the clinic at the providers discretion.     Again, thank you for choosing [facility].  Our hope is that these requests will decrease the amount of time that you wait before being seen by our physicians.       _____________________________________________________________  Should you have questions after your visit to [facility], please contact our office at [phone] between the hours of 8:30 a.m. and 4:30 p.m.  Voicemails left after 4:30 p.m. will not be returned until the following business day.  For prescription refill requests, have your pharmacy contact our office.       Resources For Cancer Patients and their Caregivers ? American Cancer Society: Can assist with transportation, wigs, general needs, runs Look Good Feel Better.        1-888-227-6333 ? Cancer Care: Provides financial assistance, online support groups, medication/co-pay assistance.  1-800-813-HOPE (4673) ? [facility] Assists [place] cancer patients and their families through emotional , educational and financial support.  336-427-4357 ? [place] DSS Where to apply for food stamps, Medicaid and utility assistance. 336-342-1394 ? RCATS: Transportation to medical appointments. 336-347-2287 ? Social Security Administration: May apply for disability if have a Stage IV cancer. 336-342-7796 1-800-772-1213 ? [place] Aging, Disability and Transit Services: Assists with nutrition, care and transit needs. 336-349-2343  Cancer Center Support Programs: @10RELATIVEDAYS@ > Cancer Support Group  2nd Tuesday of the month 1pm-2pm, Journey Room  > Creative Journey  3rd Tuesday of the month 1130am-1pm, Journey Room  > Look Good Feel Better  1st Wednesday of the month 10am-12 noon, Journey Room (Call American Cancer Society to register 1-800-395-5775)   

## 2017-07-21 NOTE — Progress Notes (Signed)
Lilli Light [place] tolerated Aranesp injection well without complaints or incident. Hgb 9.5 today. VSS Pt discharged self ambulatory in satisfactory condition  accompanied by her family members

## 2017-07-23 ENCOUNTER — Ambulatory Visit (HOSPITAL_COMMUNITY): Payer: Medicare HMO

## 2017-07-24 ENCOUNTER — Encounter (HOSPITAL_COMMUNITY): Payer: Self-pay

## 2017-07-24 ENCOUNTER — Inpatient Hospital Stay (HOSPITAL_COMMUNITY): Payer: Medicare Other | Attending: Oncology

## 2017-07-24 VITALS — BP 158/70 | HR 70 | Temp 97.5°F | Resp 18 | Wt 139.2 lb

## 2017-07-24 DIAGNOSIS — D631 Anemia in chronic kidney disease: Secondary | ICD-10-CM | POA: Insufficient documentation

## 2017-07-24 DIAGNOSIS — D649 Anemia, unspecified: Secondary | ICD-10-CM | POA: Diagnosis not present

## 2017-07-24 DIAGNOSIS — N189 Chronic kidney disease, unspecified: Secondary | ICD-10-CM | POA: Insufficient documentation

## 2017-07-24 DIAGNOSIS — E538 Deficiency of other specified B group vitamins: Secondary | ICD-10-CM

## 2017-07-24 MED ORDER — HEPARIN SOD (PORK) LOCK FLUSH 100 UNIT/ML IV SOLN
500.0000 [IU] | Freq: Once | INTRAVENOUS | Status: AC
  Administered 2017-07-24: 500 [IU] via INTRAVENOUS

## 2017-07-24 MED ORDER — SODIUM CHLORIDE 0.9 % IV SOLN
750.0000 mg | Freq: Once | INTRAVENOUS | Status: AC
  Administered 2017-07-24: 750 mg via INTRAVENOUS
  Filled 2017-07-24: qty 15

## 2017-07-24 MED ORDER — SODIUM CHLORIDE 0.9% FLUSH
10.0000 mL | INTRAVENOUS | Status: DC | PRN
Start: 2017-07-24 — End: 2017-07-24
  Administered 2017-07-24: 10 mL via INTRAVENOUS
  Filled 2017-07-24: qty 10

## 2017-07-24 MED ORDER — CYANOCOBALAMIN 1000 MCG/ML IJ SOLN
1000.0000 ug | Freq: Once | INTRAMUSCULAR | Status: AC
  Administered 2017-07-24: 1000 ug via INTRAMUSCULAR

## 2017-07-24 MED ORDER — SODIUM CHLORIDE 0.9 % IV SOLN
INTRAVENOUS | Status: DC
  Administered 2017-07-24: 13:00:00 via INTRAVENOUS

## 2017-07-24 NOTE — Progress Notes (Signed)
Janet Fletcher tolerated Injectafer infusion and Vit B12 injection well without complaints or incident. VSS upon discharge. Pt discharged self ambulatory in satisfactory condition accompanied by her daughter

## 2017-07-24 NOTE — Patient Instructions (Signed)
[facility] at [facility] Discharge Instructions  RECOMMENDATIONS MADE BY THE CONSULTANT AND ANY TEST RESULTS WILL BE SENT TO YOUR REFERRING PHYSICIAN.  Received Injectafer infusion today. Follow-up as scheduled. Call clinic for any questions or concerns  Thank you for choosing  [facility] at [facility] to provide your oncology and hematology care.  To afford each patient quality time with our provider, please arrive at least 15 minutes before your scheduled appointment time.    If you have a lab appointment with the Cancer Center please come in thru the  Main Entrance and check in at the main information desk  You need to re-schedule your appointment should you arrive 10 or more minutes late.  We strive to give you quality time with our providers, and arriving late affects you and other patients whose appointments are after yours.  Also, if you no show three or more times for appointments you may be dismissed from the clinic at the providers discretion.     Again, thank you for choosing [facility].  Our hope is that these requests will decrease the amount of time that you wait before being seen by our physicians.       _____________________________________________________________  Should you have questions after your visit to [facility], please contact our office at [phone] between the hours of 8:30 a.m. and 4:30 p.m.  Voicemails left after 4:30 p.m. will not be returned until the following business day.  For prescription refill requests, have your pharmacy contact our office.       Resources For Cancer Patients and their Caregivers ? American Cancer Society: Can assist with transportation, wigs, general needs, runs Look Good Feel Better.        1-888-227-6333 ? Cancer Care: Provides financial assistance, online support groups, medication/co-pay assistance.  1-800-813-HOPE (4673) ? [facility] Assists [place] cancer patients and their families through emotional , educational and financial support.  336-427-4357 ? [place] DSS Where to apply for food stamps, Medicaid and utility assistance. 336-342-1394 ? [employer]: Transportation to medical appointments. 336-347-2287 ? Social Security Administration: May apply for disability if have a Stage IV cancer. 336-342-7796 1-800-772-1213 ? [place] Aging, Disability and Transit Services: Assists with nutrition, care and transit needs. 336-349-2343  Cancer Center Support Programs: @10RELATIVEDAYS@ > Cancer Support Group  2nd Tuesday of the month 1pm-2pm, Journey Room  > Creative Journey  3rd Tuesday of the month 1130am-1pm, Journey Room  > Look Good Feel Better  1st Wednesday of the month 10am-12 noon, Journey Room (Call American Cancer Society to register 1-800-395-5775)   

## 2017-07-25 ENCOUNTER — Other Ambulatory Visit (HOSPITAL_COMMUNITY): Payer: Self-pay | Admitting: *Deleted

## 2017-07-25 ENCOUNTER — Other Ambulatory Visit: Payer: Self-pay | Admitting: *Deleted

## 2017-07-25 ENCOUNTER — Telehealth: Payer: Self-pay | Admitting: Nurse Practitioner

## 2017-07-25 ENCOUNTER — Ambulatory Visit (HOSPITAL_COMMUNITY): Payer: Medicare HMO

## 2017-07-25 DIAGNOSIS — R413 Other amnesia: Secondary | ICD-10-CM

## 2017-07-25 MED ORDER — CYANOCOBALAMIN 1000 MCG/ML IJ SOLN: 1000.0000 ug | INTRAMUSCULAR | 6 refills | Status: DC

## 2017-07-25 MED ORDER — DONEPEZIL HCL 10 MG PO TABS: 10.0000 mg | ORAL_TABLET | Freq: Every day | ORAL | 5 refills | Status: DC

## 2017-07-25 NOTE — Telephone Encounter (Signed)
Aware.Script sent to [facility].

## 2017-07-29 ENCOUNTER — Telehealth: Payer: Self-pay | Admitting: Nurse Practitioner

## 2017-07-29 NOTE — Telephone Encounter (Signed)
Just FYI, has Rx

## 2017-08-04 ENCOUNTER — Inpatient Hospital Stay (HOSPITAL_COMMUNITY): Payer: Medicare Other

## 2017-08-04 DIAGNOSIS — D631 Anemia in chronic kidney disease: Secondary | ICD-10-CM | POA: Diagnosis present

## 2017-08-04 DIAGNOSIS — N189 Chronic kidney disease, unspecified: Secondary | ICD-10-CM | POA: Diagnosis present

## 2017-08-04 DIAGNOSIS — D508 Other iron deficiency anemias: Secondary | ICD-10-CM

## 2017-08-04 LAB — CBC WITH DIFFERENTIAL/PLATELET
BASOS PCT: 1 %
Basophils Absolute: 0 10*3/uL (ref 0.0–0.1)
Eosinophils Absolute: 0.1 10*3/uL (ref 0.0–0.7)
Eosinophils Relative: 3 %
HEMATOCRIT: 38.2 % (ref 36.0–46.0)
HEMOGLOBIN: 11.5 g/dL — AB (ref 12.0–15.0)
LYMPHS ABS: 0.9 10*3/uL (ref 0.7–4.0)
Lymphocytes Relative: 29 %
MCH: 26.2 pg (ref 26.0–34.0)
MCHC: 30.1 g/dL (ref 30.0–36.0)
MCV: 87 fL (ref 78.0–100.0)
MONOS PCT: 9 %
Monocytes Absolute: 0.3 10*3/uL (ref 0.1–1.0)
NEUTROS ABS: 1.9 10*3/uL (ref 1.7–7.7)
NEUTROS PCT: 58 %
Platelets: 88 10*3/uL — ABNORMAL LOW (ref 150–400)
RBC: 4.39 MIL/uL (ref 3.87–5.11)
RDW: 23.6 % — ABNORMAL HIGH (ref 11.5–15.5)
WBC: 3.2 10*3/uL — ABNORMAL LOW (ref 4.0–10.5)

## 2017-08-04 LAB — COMPREHENSIVE METABOLIC PANEL
ALBUMIN: 3.7 g/dL (ref 3.5–5.0)
ALK PHOS: 75 U/L (ref 38–126)
ALT: 12 U/L — ABNORMAL LOW (ref 14–54)
ANION GAP: 6 (ref 5–15)
AST: 22 U/L (ref 15–41)
BILIRUBIN TOTAL: 0.7 mg/dL (ref 0.3–1.2)
BUN: 24 mg/dL — ABNORMAL HIGH (ref 6–20)
CALCIUM: 8.9 mg/dL (ref 8.9–10.3)
CO2: 28 mmol/L (ref 22–32)
Chloride: 104 mmol/L (ref 101–111)
Creatinine, Ser: 1.14 mg/dL — ABNORMAL HIGH (ref 0.44–1.00)
GFR, EST AFRICAN AMERICAN: 52 mL/min — AB (ref >60)
GFR, EST NON AFRICAN AMERICAN: 45 mL/min — AB (ref >60)
Glucose, Bld: 97 mg/dL (ref 65–99)
Potassium: 4.1 mmol/L (ref 3.5–5.1)
Sodium: 138 mmol/L (ref 135–145)
TOTAL PROTEIN: 7 g/dL (ref 6.5–8.1)

## 2017-08-04 NOTE — Progress Notes (Signed)
Hemoglobin 11.5. No aranesp today per protocol.

## 2017-08-07 ENCOUNTER — Telehealth: Payer: Self-pay | Admitting: Nurse Practitioner

## 2017-08-07 DIAGNOSIS — R413 Other amnesia: Secondary | ICD-10-CM

## 2017-08-07 MED ORDER — DONEPEZIL HCL 10 MG PO TABS: 10.0000 mg | ORAL_TABLET | Freq: Every day | ORAL | 1 refills | Status: DC

## 2017-08-07 MED ORDER — CYANOCOBALAMIN 1000 MCG/ML IJ SOLN: 1000.0000 ug | INTRAMUSCULAR | 1 refills | Status: DC

## 2017-08-07 NOTE — Telephone Encounter (Signed)
RXs sent to [facility] per pt request

## 2017-08-18 ENCOUNTER — Inpatient Hospital Stay (HOSPITAL_COMMUNITY): Payer: Medicare Other

## 2017-08-18 ENCOUNTER — Encounter (HOSPITAL_COMMUNITY): Payer: Self-pay

## 2017-08-18 VITALS — BP 132/55 | HR 70 | Temp 97.7°F | Resp 18 | Wt 140.6 lb

## 2017-08-18 DIAGNOSIS — D508 Other iron deficiency anemias: Secondary | ICD-10-CM

## 2017-08-18 DIAGNOSIS — N189 Chronic kidney disease, unspecified: Secondary | ICD-10-CM | POA: Diagnosis not present

## 2017-08-18 DIAGNOSIS — E538 Deficiency of other specified B group vitamins: Secondary | ICD-10-CM

## 2017-08-18 LAB — COMPREHENSIVE METABOLIC PANEL
ALBUMIN: 3.4 g/dL — AB (ref 3.5–5.0)
ALK PHOS: 72 U/L (ref 38–126)
ALT: 14 U/L (ref 14–54)
ANION GAP: 7 (ref 5–15)
AST: 21 U/L (ref 15–41)
BUN: 22 mg/dL — ABNORMAL HIGH (ref 6–20)
CHLORIDE: 109 mmol/L (ref 101–111)
CO2: 26 mmol/L (ref 22–32)
Calcium: 8.5 mg/dL — ABNORMAL LOW (ref 8.9–10.3)
Creatinine, Ser: 1.06 mg/dL — ABNORMAL HIGH (ref 0.44–1.00)
GFR calc Af Amer: 56 mL/min — ABNORMAL LOW (ref >60)
GFR calc non Af Amer: 49 mL/min — ABNORMAL LOW (ref >60)
Glucose, Bld: 100 mg/dL — ABNORMAL HIGH (ref 65–99)
POTASSIUM: 3.8 mmol/L (ref 3.5–5.1)
SODIUM: 142 mmol/L (ref 135–145)
Total Bilirubin: 0.7 mg/dL (ref 0.3–1.2)
Total Protein: 6.3 g/dL — ABNORMAL LOW (ref 6.5–8.1)

## 2017-08-18 LAB — CBC WITH DIFFERENTIAL/PLATELET
BASOS PCT: 1 %
Basophils Absolute: 0 10*3/uL (ref 0.0–0.1)
EOS ABS: 0.1 10*3/uL (ref 0.0–0.7)
Eosinophils Relative: 3 %
HCT: 33.5 % — ABNORMAL LOW (ref 36.0–46.0)
HEMOGLOBIN: 10.1 g/dL — AB (ref 12.0–15.0)
Lymphocytes Relative: 26 %
Lymphs Abs: 0.8 10*3/uL (ref 0.7–4.0)
MCH: 26 pg (ref 26.0–34.0)
MCHC: 30.1 g/dL (ref 30.0–36.0)
MCV: 86.3 fL (ref 78.0–100.0)
MONOS PCT: 2 %
Monocytes Absolute: 0.1 10*3/uL (ref 0.1–1.0)
NEUTROS PCT: 68 %
Neutro Abs: 2.1 10*3/uL (ref 1.7–7.7)
Platelets: 84 10*3/uL — ABNORMAL LOW (ref 150–400)
RBC: 3.88 MIL/uL (ref 3.87–5.11)
RDW: 22.7 % — AB (ref 11.5–15.5)
WBC: 3.1 10*3/uL — ABNORMAL LOW (ref 4.0–10.5)

## 2017-08-18 MED ORDER — DARBEPOETIN ALFA 200 MCG/0.4ML IJ SOSY
PREFILLED_SYRINGE | INTRAMUSCULAR | Status: AC
  Filled 2017-08-18: qty 0.4

## 2017-08-18 MED ORDER — CYANOCOBALAMIN 1000 MCG/ML IJ SOLN: 1000.0000 ug | Freq: Once | INTRAMUSCULAR | Status: DC

## 2017-08-18 MED ORDER — DARBEPOETIN ALFA 200 MCG/0.4ML IJ SOSY
200.0000 ug | PREFILLED_SYRINGE | Freq: Once | INTRAMUSCULAR | Status: AC
  Administered 2017-08-18: 200 ug via SUBCUTANEOUS

## 2017-08-18 MED ORDER — CYANOCOBALAMIN 1000 MCG/ML IJ SOLN
INTRAMUSCULAR | Status: AC
  Filled 2017-08-18: qty 1

## 2017-08-18 NOTE — Progress Notes (Signed)
Janet Fletcher tolerated Aranesp injection well without complaints or incident.Vit B12 injection being given at home by family on a monthly basis VSS Pt discharged self ambulatory in satisfactory condition accompanied by family member

## 2017-08-18 NOTE — Patient Instructions (Signed)
[facility] at [facility] Discharge Instructions  RECOMMENDATIONS MADE BY THE CONSULTANT AND ANY TEST RESULTS WILL BE SENT TO YOUR REFERRING PHYSICIAN.  Received Aranesp injection today. Follow-up as scheduled. Call clinic for any questions or concerns  Thank you for choosing [facility] at [facility] to provide your oncology and hematology care.  To afford each patient quality time with our provider, please arrive at least 15 minutes before your scheduled appointment time.    If you have a lab appointment with the Cancer Center please come in thru the  Main Entrance and check in at the main information desk  You need to re-schedule your appointment should you arrive 10 or more minutes late.  We strive to give you quality time with our providers, and arriving late affects you and other patients whose appointments are after yours.  Also, if you no show three or more times for appointments you may be dismissed from the clinic at the providers discretion.     Again, thank you for choosing [facility].  Our hope is that these requests will decrease the amount of time that you wait before being seen by our physicians.       _____________________________________________________________  Should you have questions after your visit to [facility], please contact our office at [phone] between the hours of 8:30 a.m. and 4:30 p.m.  Voicemails left after 4:30 p.m. will not be returned until the following business day.  For prescription refill requests, have your pharmacy contact our office.       Resources For Cancer Patients and their Caregivers ? American Cancer Society: Can assist with transportation, wigs, general needs, runs Look Good Feel Better.        1-888-227-6333 ? Cancer Care: Provides financial assistance, online support groups, medication/co-pay assistance.  1-800-813-HOPE (4673) ? [facility] Assists [place] cancer patients and their families through emotional , educational and financial support.  336-427-4357 ? [place] DSS Where to apply for food stamps, Medicaid and utility assistance. 336-342-1394 ? RCATS: Transportation to medical appointments. 336-347-2287 ? Social Security Administration: May apply for disability if have a Stage IV cancer. 336-342-7796 1-800-772-1213 ? [place] Aging, Disability and Transit Services: Assists with nutrition, care and transit needs. 336-349-2343  Cancer Center Support Programs: @10RELATIVEDAYS@ > Cancer Support Group  2nd Tuesday of the month 1pm-2pm, Journey Room  > Creative Journey  3rd Tuesday of the month 1130am-1pm, Journey Room  > Look Good Feel Better  1st Wednesday of the month 10am-12 noon, Journey Room (Call American Cancer Society to register 1-800-395-5775)   

## 2017-09-01 ENCOUNTER — Inpatient Hospital Stay (HOSPITAL_COMMUNITY): Payer: Medicare Other | Attending: Internal Medicine

## 2017-09-01 ENCOUNTER — Inpatient Hospital Stay (HOSPITAL_COMMUNITY): Payer: Medicare Other

## 2017-09-01 ENCOUNTER — Encounter (HOSPITAL_COMMUNITY): Payer: Self-pay

## 2017-09-01 VITALS — BP 140/73 | HR 67 | Temp 97.8°F | Resp 18 | Wt 140.0 lb

## 2017-09-01 DIAGNOSIS — C3491 Malignant neoplasm of unspecified part of right bronchus or lung: Secondary | ICD-10-CM | POA: Diagnosis not present

## 2017-09-01 DIAGNOSIS — D61818 Other pancytopenia: Secondary | ICD-10-CM | POA: Diagnosis not present

## 2017-09-01 DIAGNOSIS — R5383 Other fatigue: Secondary | ICD-10-CM | POA: Diagnosis not present

## 2017-09-01 DIAGNOSIS — D649 Anemia, unspecified: Secondary | ICD-10-CM | POA: Diagnosis present

## 2017-09-01 DIAGNOSIS — D508 Other iron deficiency anemias: Secondary | ICD-10-CM

## 2017-09-01 DIAGNOSIS — D631 Anemia in chronic kidney disease: Secondary | ICD-10-CM | POA: Diagnosis not present

## 2017-09-01 DIAGNOSIS — D696 Thrombocytopenia, unspecified: Secondary | ICD-10-CM | POA: Insufficient documentation

## 2017-09-01 DIAGNOSIS — D72819 Decreased white blood cell count, unspecified: Secondary | ICD-10-CM | POA: Diagnosis not present

## 2017-09-01 DIAGNOSIS — N189 Chronic kidney disease, unspecified: Secondary | ICD-10-CM | POA: Diagnosis present

## 2017-09-01 DIAGNOSIS — E538 Deficiency of other specified B group vitamins: Secondary | ICD-10-CM

## 2017-09-01 LAB — COMPREHENSIVE METABOLIC PANEL
ALT: 11 U/L — ABNORMAL LOW (ref 14–54)
ANION GAP: 8 (ref 5–15)
AST: 22 U/L (ref 15–41)
Albumin: 3.6 g/dL (ref 3.5–5.0)
Alkaline Phosphatase: 76 U/L (ref 38–126)
BUN: 19 mg/dL (ref 6–20)
CHLORIDE: 105 mmol/L (ref 101–111)
CO2: 27 mmol/L (ref 22–32)
Calcium: 9 mg/dL (ref 8.9–10.3)
Creatinine, Ser: 1.11 mg/dL — ABNORMAL HIGH (ref 0.44–1.00)
GFR, EST AFRICAN AMERICAN: 53 mL/min — AB (ref >60)
GFR, EST NON AFRICAN AMERICAN: 46 mL/min — AB (ref >60)
Glucose, Bld: 93 mg/dL (ref 65–99)
Potassium: 4 mmol/L (ref 3.5–5.1)
SODIUM: 140 mmol/L (ref 135–145)
Total Bilirubin: 0.8 mg/dL (ref 0.3–1.2)
Total Protein: 6.7 g/dL (ref 6.5–8.1)

## 2017-09-01 LAB — CBC WITH DIFFERENTIAL/PLATELET
BASOS PCT: 1 %
Basophils Absolute: 0 10*3/uL (ref 0.0–0.1)
EOS ABS: 0.1 10*3/uL (ref 0.0–0.7)
EOS PCT: 3 %
HCT: 35.9 % — ABNORMAL LOW (ref 36.0–46.0)
Hemoglobin: 11 g/dL — ABNORMAL LOW (ref 12.0–15.0)
LYMPHS ABS: 0.7 10*3/uL (ref 0.7–4.0)
LYMPHS PCT: 25 %
MCH: 27 pg (ref 26.0–34.0)
MCHC: 30.6 g/dL (ref 30.0–36.0)
MCV: 88 fL (ref 78.0–100.0)
MONO ABS: 0.2 10*3/uL (ref 0.1–1.0)
MONOS PCT: 6 %
NEUTROS ABS: 1.7 10*3/uL (ref 1.7–7.7)
Neutrophils Relative %: 65 %
PLATELETS: 67 10*3/uL — AB (ref 150–400)
RBC: 4.08 MIL/uL (ref 3.87–5.11)
RDW: 23.7 % — AB (ref 11.5–15.5)
WBC: 2.7 10*3/uL — AB (ref 4.0–10.5)

## 2017-09-01 MED ORDER — DARBEPOETIN ALFA 200 MCG/0.4ML IJ SOSY
200.0000 ug | PREFILLED_SYRINGE | Freq: Once | INTRAMUSCULAR | Status: AC
  Administered 2017-09-01: 200 ug via SUBCUTANEOUS
  Filled 2017-09-01: qty 0.4

## 2017-09-01 NOTE — Patient Instructions (Signed)
[facility] at [facility] Discharge Instructions  RECOMMENDATIONS MADE BY THE CONSULTANT AND ANY TEST RESULTS WILL BE SENT TO YOUR REFERRING PHYSICIAN.  Received Aranesp injection today. Follow-up as scheduled. Call clinic for any questions or concerns  Thank you for choosing [facility] at [facility] to provide your oncology and hematology care.  To afford each patient quality time with our provider, please arrive at least 15 minutes before your scheduled appointment time.    If you have a lab appointment with the Cancer Center please come in thru the  Main Entrance and check in at the main information desk  You need to re-schedule your appointment should you arrive 10 or more minutes late.  We strive to give you quality time with our providers, and arriving late affects you and other patients whose appointments are after yours.  Also, if you no show three or more times for appointments you may be dismissed from the clinic at the providers discretion.     Again, thank you for choosing [facility].  Our hope is that these requests will decrease the amount of time that you wait before being seen by our physicians.       _____________________________________________________________  Should you have questions after your visit to [facility], please contact our office at [phone] between the hours of 8:30 a.m. and 4:30 p.m.  Voicemails left after 4:30 p.m. will not be returned until the following business day.  For prescription refill requests, have your pharmacy contact our office.       Resources For Cancer Patients and their Caregivers ? American Cancer Society: Can assist with transportation, wigs, general needs, runs Look Good Feel Better.        1-888-227-6333 ? Cancer Care: Provides financial assistance, online support groups, medication/co-pay assistance.  1-800-813-HOPE (4673) ? [facility] Assists [place] cancer patients and their families through emotional , educational and financial support.  336-427-4357 ? [place] DSS Where to apply for food stamps, Medicaid and utility assistance. 336-342-1394 ? [employer]: Transportation to medical appointments. 336-347-2287 ? Social Security Administration: May apply for disability if have a Stage IV cancer. 336-342-7796 1-800-772-1213 ? [place] Aging, Disability and Transit Services: Assists with nutrition, care and transit needs. 336-349-2343  Cancer Center Support Programs: @10RELATIVEDAYS@ > Cancer Support Group  2nd Tuesday of the month 1pm-2pm, Journey Room  > Creative Journey  3rd Tuesday of the month 1130am-1pm, Journey Room  > Look Good Feel Better  1st Wednesday of the month 10am-12 noon, Journey Room (Call American Cancer Society to register 1-800-395-5775)   

## 2017-09-01 NOTE — Progress Notes (Signed)
Janet Fletcher Arya tolerated Aranesp injection well without complaints or incident. Hgb 11 today. VSS Pt discharged self ambulatory in satisfactory condition accompanied by family member

## 2017-09-12 ENCOUNTER — Other Ambulatory Visit (HOSPITAL_COMMUNITY): Payer: Self-pay | Admitting: *Deleted

## 2017-09-12 DIAGNOSIS — C3491 Malignant neoplasm of unspecified part of right bronchus or lung: Secondary | ICD-10-CM

## 2017-09-15 ENCOUNTER — Inpatient Hospital Stay (HOSPITAL_BASED_OUTPATIENT_CLINIC_OR_DEPARTMENT_OTHER): Payer: Medicare Other | Admitting: Internal Medicine

## 2017-09-15 ENCOUNTER — Inpatient Hospital Stay (HOSPITAL_COMMUNITY): Payer: Medicare Other

## 2017-09-15 ENCOUNTER — Encounter (HOSPITAL_COMMUNITY): Payer: Self-pay | Admitting: Internal Medicine

## 2017-09-15 ENCOUNTER — Other Ambulatory Visit: Payer: Self-pay

## 2017-09-15 VITALS — BP 131/71 | HR 71 | Temp 97.7°F | Resp 16 | Ht 66.0 in | Wt 139.4 lb

## 2017-09-15 DIAGNOSIS — D72819 Decreased white blood cell count, unspecified: Secondary | ICD-10-CM | POA: Diagnosis not present

## 2017-09-15 DIAGNOSIS — D61818 Other pancytopenia: Secondary | ICD-10-CM

## 2017-09-15 DIAGNOSIS — D696 Thrombocytopenia, unspecified: Secondary | ICD-10-CM | POA: Diagnosis not present

## 2017-09-15 DIAGNOSIS — N189 Chronic kidney disease, unspecified: Secondary | ICD-10-CM | POA: Diagnosis not present

## 2017-09-15 DIAGNOSIS — C3491 Malignant neoplasm of unspecified part of right bronchus or lung: Secondary | ICD-10-CM

## 2017-09-15 DIAGNOSIS — D649 Anemia, unspecified: Secondary | ICD-10-CM

## 2017-09-15 DIAGNOSIS — R5383 Other fatigue: Secondary | ICD-10-CM | POA: Diagnosis not present

## 2017-09-15 LAB — CBC WITH DIFFERENTIAL/PLATELET
Basophils Absolute: 0 10*3/uL (ref 0.0–0.1)
Basophils Relative: 1 %
Eosinophils Absolute: 0.1 10*3/uL (ref 0.0–0.7)
Eosinophils Relative: 3 %
HEMATOCRIT: 33.8 % — AB (ref 36.0–46.0)
Hemoglobin: 10.4 g/dL — ABNORMAL LOW (ref 12.0–15.0)
LYMPHS ABS: 0.7 10*3/uL (ref 0.7–4.0)
LYMPHS PCT: 25 %
MCH: 27 pg (ref 26.0–34.0)
MCHC: 30.8 g/dL (ref 30.0–36.0)
MCV: 87.8 fL (ref 78.0–100.0)
Monocytes Absolute: 0.4 10*3/uL (ref 0.1–1.0)
Monocytes Relative: 14 %
NEUTROS PCT: 57 %
Neutro Abs: 1.5 10*3/uL — ABNORMAL LOW (ref 1.7–7.7)
Platelets: 67 10*3/uL — ABNORMAL LOW (ref 150–400)
RBC: 3.85 MIL/uL — AB (ref 3.87–5.11)
RDW: 23.3 % — ABNORMAL HIGH (ref 11.5–15.5)
WBC: 2.6 10*3/uL — AB (ref 4.0–10.5)

## 2017-09-15 NOTE — Patient Instructions (Signed)
[place] at [facility] Discharge Instructions  RECOMMENDATIONS MADE BY THE CONSULTANT AND ANY TEST RESULTS WILL BE SENT TO YOUR REFERRING PHYSICIAN.  You were seen today by Dr. Zoila Shutter Your hemoglobin today is good so we will not be giving you your Aranesp injection We will set you up for a bone marrow biopsy within the next 10-14 days.  You will follow up with [place] about 1 week after for results  Thank you for choosing [place] at [facility] to provide your oncology and hematology care.  To afford each patient quality time with our provider, please arrive at least 15 minutes before your scheduled appointment time.    If you have a lab appointment with the [place] please come in thru the  Main Entrance and check in at the main information desk  You need to re-schedule your appointment should you arrive 10 or more minutes late.  We strive to give you quality time with our providers, and arriving late affects you and other patients whose appointments are after yours.  Also, if you no show three or more times for appointments you may be dismissed from the clinic at the providers discretion.     Again, thank you for choosing [facility].  Our hope is that these requests will decrease the amount of time that you wait before being seen by our physicians.       _____________________________________________________________  Should you have questions after your visit to [facility], please contact our office at [phone] between the hours of 8:30 a.m. and 4:30 p.m.  Voicemails left after 4:30 p.m. will not be returned until the following business day.  For prescription refill requests, have your pharmacy contact our office.       Resources For Cancer Patients and their Caregivers ? American Cancer Society: Can assist with transportation, wigs, general needs, runs Look Good Feel Better.         (346) 080-3587 ? Cancer Care: Provides financial assistance, online support groups, medication/co-pay assistance.  1-800-813-HOPE (734) 602-4664) ? [facility] Assists [place] cancer patients and their families through emotional , educational and financial support.  626-794-5867 ? [place] DSS Where to apply for food stamps, Medicaid and utility assistance. 7433826754 ? [employer]: Transportation to medical appointments. (754) 136-3936 ? Social Security Administration: May apply for disability if have a Stage IV cancer. 680-179-3854 820-006-5457 ? [employer], Disability and Transit Services: Assists with nutrition, care and transit needs. [place] Support Programs: _0 @ > Cancer Support Group  2nd Tuesday of the month 1pm-2pm, Journey Room  > Creative Journey  3rd Tuesday of the month 1130am-1pm, Journey Room  > Look Good Feel Better  1st Wednesday of the month 10am-12 noon, Journey Room (Call [place] to register [phone])

## 2017-09-15 NOTE — Progress Notes (Signed)
Diagnosis Small cell carcinoma of right lung ([place]) - Plan: Biopsy bone marrow, CT Biopsy  Anemia, unspecified type - Plan: Biopsy bone marrow, CT Biopsy  Staging Cancer Staging No matching staging information was found for the patient.  Assessment and Plan:  1.  Limited stage small cell lung cancer: She had CT chest performed July 02, 2017 which showed no new or progressive disease.   She will be set up for repeat imaging in June 2019 and will follow up to go over the results of her imaging at that time.   2.  Pancytopenia.  Patient has been followed by Dr. Talbert Cage.  She was on Aranesp 200 mcg every 2 weeks.  She was also on monthly B12 injections.  Discussed with the family and pt that patient's counts remain decreased and she has evidence of leukopenia and thrombocytopenia.  White count is 2.6 platelets 67,000.  Based on her history I have discussed with the patient and her family recommendations for bone marrow aspirate and biopsy for definitive diagnosis.  Aranesp will be held.  She will return to clinic in 2-3 weeks for follow-up to go the results of her bone marrow biopsy.  All questions answered and they expressed understanding of the information presented.  Interval history:  Limited stage small cell lung cancer.  Patient was treated with chemotherapy and radiation and PCI completed in 2017.    Current status:  Patient is seen today for follow-up.  Family reports she has been fatigued.  She is here to go over lab studies.       Small cell carcinoma of right lung ([place])   11/28/2015 Imaging    Large anterior mediastinal mass in continuity with a R hilar mass, narrowing of SVC, abnormal densities in RUL, 9 mm spiculated nodule, nonspec. hypodensity in liver      12/05/2015 PET scan    Large hypermetabolic paratracheal mass c/w SCLC, perihilar nodular densities in RML, mild metabolic activity RUL nodule, no distant metastatic disease      12/11/2015 Procedure    Video bronch with  biopsies and brushings, endobronchial ultrasound with mediastinal LN aspiration. Dr. Roxan Hockey      12/11/2015 Pathology Results    Trachea biopsy negative, FNA RUL malignant cells c/w SCLC      12/13/2015 - 03/15/2016 Chemotherapy    Cisplatin/Etoposide x 5 cycles.  Cycle #6 cancelled due to significant cytopenias      12/25/2015 Imaging    MRI brain- No intracranial parenchymal enhancing lesion. Tiny right frontal calvarial enhancing lesion. Small metastatic lesion not excluded although this may represent an incidentally detected benign process.      12/29/2015 - 02/13/2016 Radiation Therapy    XRT      02/14/2016 Treatment Plan Change    Chemotherapy deferred x 1 week      02/21/2016 Treatment Plan Change    Carboplatin dose reduced by 20% and Etoposide dose reduced by 10%.      04/24/2016 Imaging    CT CAP- Chest Impression:  1. Marked reduction in mediastinal and RIGHT hilar lymphadenopathy with no residual pathologically enlarged nodes. 2. Resolution of perihilar nodularity in the RIGHT lung which was hypermetabolic on FDG PET. 3. Persistent nodule in the RIGHT upper lobe which is not hypermetabolic. 4. New ground-glass nodule in the RIGHT upper lobe is nonspecific and may relate to therapy.  Abdomen / Pelvis Impression:  1. No evidence of metastatic disease in the abdomen pelvis. 2. Atherosclerotic calcification of the aorta.  04/24/2016 Imaging    Bone scan- Nonspecific increased tracer localization at approximately T11 vertebral body; this is of uncertain etiology, could represent a degenerative process though metastatic disease is not excluded.      04/29/2016 -  Radiation Therapy    PCI -- 25 Gy      07/24/2016 Imaging    CT CAP- 1. There has been interval decrease in size of mediastinal lymph nodes. The index lesion within the right upper lobe is also decreased in size from previous exam. 2. New subpleural nodule is noted within the posteromedial  right lower lobe. Attention on follow-up imaging advised. 3. Stable ground-glass attenuating nodule within the right lower lobe and anteromedial left upper lobe 4. Aortic atherosclerosis and coronary artery calcification      07/29/2016 Imaging    MRI t-spine: 1. Stable benign slightly enhancing sclerotic lesion in the right posterolateral aspect of the T11 vertebral body, unchanged for 5 years. This is felt to account for the subtle increased activity at T11 on the SPECT bone scan. 2. Slight degenerative disc disease with small disc protrusions at T1-2 and T2-3 and T12-L1 with no neural impingement. 3. No evidence of metastatic disease.      09/23/2016 Imaging    CT chest- Radiation changes in the central right upper lobe/ perihilar region.  Stable small mediastinal lymph nodes, unchanged.  No findings suspicious for new/progressive metastatic disease.      12/25/2016 Imaging    CT C/A/P: IMPRESSION: 1. Continued further decrease in mediastinal lymph nodes. 2. Index nodule antero medial right upper lobe has decreased. The new 5 mm right lower lobe subpleural nodule seen on the previous study has resolved in the interval. Ground-glass nodules in the upper lobes bilaterally are stable. 3. Interval development of interstitial and alveolar opacity in the parahilar right lung is presumably radiation related. Attention on follow-up recommended. 4. New tiny right pleural effusion with areas of apparent pleural enhancement in the posterior right costophrenic sulcus. Close attention on follow-up recommended. 5. Marked abdominal aortic atherosclerosis with likely infrarenal significant stenosis.       07/02/2017 Imaging    CT Chest w/ contrast: Resolution of central right lower lobe infiltrate and right pleural effusion since prior study.  Stable ground-glass opacities in both upper lobes.  No new or progressive disease         Problem List Patient Active Problem List    Diagnosis Date Noted  . Mild memory disturbance [R41.3] 06/05/2017  . Helicobacter pylori gastritis [K29.70, B96.81] 04/03/2017  . Heme + stool [R19.5] 11/07/2016  . Tobacco abuse [Z72.0] 12/15/2015  . Absolute anemia [D64.9] 12/15/2015  . Low vitamin B12 level [E53.8] 12/15/2015  . Iron deficiency anemia [D50.9] 12/15/2015  . Small cell carcinoma of right lung ([place]) [C34.91] 12/07/2015  . 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 Combined forms of age-related cataract of left eye [H25.812] 12/15/2014  . Hyperlipidemia with target LDL less than 100 [E78.5] 09/29/2014  . GAD (generalized anxiety disorder) [F41.1] 09/29/2014    Past Medical History Past Medical History:  Diagnosis Date  . Allergy   . Cancer ([place])    lung  . Hyperlipidemia   . Iron deficiency anemia 12/15/2015  . Low vitamin B12 level 12/15/2015  . Osteopenia     Past Surgical History Past Surgical History:  Procedure Laterality Date  . CATARACT EXTRACTION, BILATERAL Bilateral   . COLONOSCOPY N/A 12/06/2016   Procedure: COLONOSCOPY;  Surgeon: Danie Binder, MD;  [facility];  Service: Endoscopy;  Laterality: N/A;  1:45pm  . ESOPHAGOGASTRODUODENOSCOPY N/A 12/06/2016   Procedure: ESOPHAGOGASTRODUODENOSCOPY (EGD);  Surgeon: Danie Binder, MD;  [facility];  Service: Endoscopy;  Laterality: N/A;  . MINOR EXCISION EAR CANAL CYST Left    30+ years ago  . SMALL INTESTINE SURGERY    . VIDEO BRONCHOSCOPY WITH ENDOBRONCHIAL ULTRASOUND N/A 12/11/2015   Procedure: VIDEO BRONCHOSCOPY WITH ENDOBRONCHIAL ULTRASOUND;  Surgeon: Melrose Nakayama, MD;  [facility];  Service: Thoracic;  Laterality: N/A;    Family History Family History  Problem Relation Age of Onset  . Cancer Mother   . Hypertension Father   . Colon cancer Neg Hx      Social History  reports that she quit smoking about 20 months ago. She has a 12.50 pack-year smoking history. she has never  used smokeless tobacco. She reports that she does not drink alcohol or use drugs.  Medications  Current Outpatient Medications:  .  atorvastatin (LIPITOR) 40 MG tablet, Take 40 mg by mouth., Disp: , Rfl:  .  cyanocobalamin (,VITAMIN B-12,) 1000 MCG/ML injection, Inject 1 mL (1,000 mcg total) into the muscle every 30 (thirty) days., Disp: 3 mL, Rfl: 1 .  donepezil (ARICEPT) 10 MG tablet, Take 1 tablet (10 mg total) by mouth at bedtime., Disp: 90 tablet, Rfl: 1 .  ketorolac (ACULAR) 0.4 % SOLN, Place 1 drop into the left eye 4 (four) times daily., Disp: , Rfl:   Allergies Acetaminophen; Flagyl [metronidazole hcl]; and Penicillins  Review of Systems Review of Systems - Oncology ROS as per HPI otherwise 12 point ROS is negative.   Physical Exam  Vitals Wt Readings from Last 3 Encounters:  09/15/17 139 lb 6.4 oz (63.2 kg)  09/01/17 140 lb (63.5 kg)  08/18/17 140 lb 9.6 oz (63.8 kg)   Temp Readings from Last 3 Encounters:  09/15/17 97.7 F (36.5 C) (Oral)  09/01/17 97.8 F (36.6 C) (Oral)  08/18/17 97.7 F (36.5 C) (Oral)   BP Readings from Last 3 Encounters:  09/15/17 131/71  09/01/17 140/73  08/18/17 (!) 132/55   Pulse Readings from Last 3 Encounters:  09/15/17 71  09/01/17 67  08/18/17 70   Constitutional: Well-developed, well-nourished, and in no distress.   HENT: Head: Normocephalic and atraumatic.  Mouth/Throat: No oropharyngeal exudate. Mucosa moist. Eyes: Pupils are equal, round, and reactive to light. Conjunctivae are normal. No scleral icterus.  Neck: Normal range of motion. Neck supple. No JVD present.  Cardiovascular: Normal rate, regular rhythm and normal heart sounds.  Exam reveals no gallop and no friction rub.   No murmur heard. Pulmonary/Chest: Effort normal and breath sounds normal. No respiratory distress. No wheezes.No rales.  Abdominal: Soft. Bowel sounds are normal. No distension. There is no tenderness. There is no guarding.  Musculoskeletal: No  edema or tenderness.  Lymphadenopathy: No cervical, axillary or supraclavicular adenopathy.  Neurological: Alert and oriented to person, place, and time. No cranial nerve deficit.  Skin: Skin is warm and dry. No rash noted. No erythema. No pallor.  Psychiatric: Affect and judgment normal.   Labs Appointment on 09/15/2017  Component Date Value Ref Range Status  . WBC 09/15/2017 2.6* 4.0 - 10.5 K/uL Final  . RBC 09/15/2017 3.85* 3.87 - 5.11 MIL/uL Final  . Hemoglobin 09/15/2017 10.4* 12.0 - 15.0 g/dL Final  . HCT 09/15/2017 33.8* 36.0 - 46.0 % Final  . MCV 09/15/2017 87.8  78.0 - 100.0 fL Final  .  [place] 09/15/2017 27.0  26.0 - 34.0 pg Final  . MCHC 09/15/2017 30.8  30.0 - 36.0 g/dL Final  . RDW 09/15/2017 23.3* 11.5 - 15.5 % Final  . Platelets 09/15/2017 67* 150 - 400 K/uL Final   Comment: PLATELET COUNT CONFIRMED BY SMEAR SPECIMEN CHECKED FOR CLOTS   . Neutrophils Relative % 09/15/2017 57  % Final  . Neutro Abs 09/15/2017 1.5* 1.7 - 7.7 K/uL Final  . Lymphocytes Relative 09/15/2017 25  % Final  . Lymphs Abs 09/15/2017 0.7  0.7 - 4.0 K/uL Final  . Monocytes Relative 09/15/2017 14  % Final  . Monocytes Absolute 09/15/2017 0.4  0.1 - 1.0 K/uL Final  . Eosinophils Relative 09/15/2017 3  % Final  . Eosinophils Absolute 09/15/2017 0.1  0.0 - 0.7 K/uL Final  . Basophils Relative 09/15/2017 1  % Final  . Basophils Absolute 09/15/2017 0.0  0.0 - 0.1 K/uL Final  . RBC Morphology 09/15/2017 ANISOCYTES   Final   Performed at [facility], [address], [place], [place] [address]     Pathology:  EBUS RUL done 12/11/2015.  SCLC  Orders Placed This Encounter  Procedures  . Biopsy bone marrow    Standing Status:   Future    Standing Expiration Date:   09/15/2018    Scheduling Instructions:     Bone marrow aspirate and biopsy          Cytology, flow cytometry, cytogenetics  . CT Biopsy    Standing Status:   Future    Standing Expiration Date:   09/15/2018    Order Specific Question:   Lab  orders requested (DO NOT place separate lab orders, these will be automatically ordered during procedure specimen collection):    Answer:   Surgical Pathology    Order Specific Question:   Reason for Exam (SYMPTOM  OR DIAGNOSIS REQUIRED)    Answer:   Small Cell lung cancer, pancytopenia    Order Specific Question:   Preferred imaging location?    Answer:   [facility]    Order Specific Question:   Radiology Contrast Protocol - do NOT remove file path    Answer:   [email or web address]       Zoila Shutter MD

## 2017-09-20 ENCOUNTER — Other Ambulatory Visit (HOSPITAL_COMMUNITY): Payer: Self-pay | Admitting: Adult Health

## 2017-09-20 DIAGNOSIS — D631 Anemia in chronic kidney disease: Secondary | ICD-10-CM | POA: Insufficient documentation

## 2017-09-20 DIAGNOSIS — N189 Chronic kidney disease, unspecified: Principal | ICD-10-CM

## 2017-09-22 ENCOUNTER — Other Ambulatory Visit (HOSPITAL_COMMUNITY): Payer: Self-pay | Admitting: *Deleted

## 2017-09-22 ENCOUNTER — Other Ambulatory Visit (HOSPITAL_COMMUNITY): Payer: Self-pay | Admitting: Internal Medicine

## 2017-09-22 DIAGNOSIS — C3491 Malignant neoplasm of unspecified part of right bronchus or lung: Secondary | ICD-10-CM

## 2017-09-22 DIAGNOSIS — D6189 Other specified aplastic anemias and other bone marrow failure syndromes: Secondary | ICD-10-CM

## 2017-09-29 ENCOUNTER — Inpatient Hospital Stay (HOSPITAL_COMMUNITY): Payer: Medicare Other | Attending: Internal Medicine

## 2017-09-29 ENCOUNTER — Other Ambulatory Visit: Payer: Self-pay | Admitting: Radiology

## 2017-09-29 ENCOUNTER — Encounter (HOSPITAL_COMMUNITY): Payer: Self-pay

## 2017-09-29 ENCOUNTER — Ambulatory Visit (HOSPITAL_COMMUNITY): Payer: Medicare HMO | Admitting: Internal Medicine

## 2017-09-29 ENCOUNTER — Inpatient Hospital Stay (HOSPITAL_COMMUNITY): Payer: Medicare Other

## 2017-09-29 VITALS — BP 110/55 | HR 78 | Temp 98.2°F | Resp 16

## 2017-09-29 DIAGNOSIS — D649 Anemia, unspecified: Secondary | ICD-10-CM | POA: Insufficient documentation

## 2017-09-29 DIAGNOSIS — D696 Thrombocytopenia, unspecified: Secondary | ICD-10-CM | POA: Diagnosis not present

## 2017-09-29 DIAGNOSIS — E538 Deficiency of other specified B group vitamins: Secondary | ICD-10-CM

## 2017-09-29 DIAGNOSIS — D508 Other iron deficiency anemias: Secondary | ICD-10-CM

## 2017-09-29 DIAGNOSIS — N189 Chronic kidney disease, unspecified: Secondary | ICD-10-CM

## 2017-09-29 DIAGNOSIS — D631 Anemia in chronic kidney disease: Secondary | ICD-10-CM

## 2017-09-29 DIAGNOSIS — C3491 Malignant neoplasm of unspecified part of right bronchus or lung: Secondary | ICD-10-CM

## 2017-09-29 LAB — CBC WITH DIFFERENTIAL/PLATELET
BASOS ABS: 0.1 10*3/uL (ref 0.0–0.1)
BASOS PCT: 2 %
EOS PCT: 2 %
Eosinophils Absolute: 0 10*3/uL (ref 0.0–0.7)
HCT: 27.5 % — ABNORMAL LOW (ref 36.0–46.0)
Hemoglobin: 8.5 g/dL — ABNORMAL LOW (ref 12.0–15.0)
Lymphocytes Relative: 23 %
Lymphs Abs: 0.5 10*3/uL — ABNORMAL LOW (ref 0.7–4.0)
MCH: 27.2 pg (ref 26.0–34.0)
MCHC: 30.9 g/dL (ref 30.0–36.0)
MCV: 88.1 fL (ref 78.0–100.0)
MONO ABS: 0.2 10*3/uL (ref 0.1–1.0)
MONOS PCT: 11 %
Neutro Abs: 1.4 10*3/uL — ABNORMAL LOW (ref 1.7–7.7)
Neutrophils Relative %: 62 %
PLATELETS: 65 10*3/uL — AB (ref 150–400)
RBC: 3.12 MIL/uL — ABNORMAL LOW (ref 3.87–5.11)
RDW: 22.2 % — AB (ref 11.5–15.5)
WBC: 2.2 10*3/uL — ABNORMAL LOW (ref 4.0–10.5)

## 2017-09-29 LAB — IRON AND TIBC
Iron: 99 ug/dL (ref 28–170)
SATURATION RATIOS: 44 % — AB (ref 10.4–31.8)
TIBC: 224 ug/dL — AB (ref 250–450)
UIBC: 125 ug/dL

## 2017-09-29 LAB — COMPREHENSIVE METABOLIC PANEL
ALT: 13 U/L — AB (ref 14–54)
ANION GAP: 7 (ref 5–15)
AST: 22 U/L (ref 15–41)
Albumin: 3.5 g/dL (ref 3.5–5.0)
Alkaline Phosphatase: 68 U/L (ref 38–126)
BUN: 26 mg/dL — ABNORMAL HIGH (ref 6–20)
CHLORIDE: 105 mmol/L (ref 101–111)
CO2: 28 mmol/L (ref 22–32)
Calcium: 8.9 mg/dL (ref 8.9–10.3)
Creatinine, Ser: 1.21 mg/dL — ABNORMAL HIGH (ref 0.44–1.00)
GFR, EST AFRICAN AMERICAN: 48 mL/min — AB (ref >60)
GFR, EST NON AFRICAN AMERICAN: 41 mL/min — AB (ref >60)
Glucose, Bld: 98 mg/dL (ref 65–99)
POTASSIUM: 4 mmol/L (ref 3.5–5.1)
SODIUM: 140 mmol/L (ref 135–145)
Total Bilirubin: 0.8 mg/dL (ref 0.3–1.2)
Total Protein: 6.5 g/dL (ref 6.5–8.1)

## 2017-09-29 LAB — VITAMIN B12: VITAMIN B 12: 796 pg/mL (ref 180–914)

## 2017-09-29 LAB — FERRITIN: FERRITIN: 508 ng/mL — AB (ref 11–307)

## 2017-09-29 MED ORDER — DARBEPOETIN ALFA 200 MCG/0.4ML IJ SOSY
200.0000 ug | PREFILLED_SYRINGE | Freq: Once | INTRAMUSCULAR | Status: AC
  Administered 2017-09-29: 200 ug via SUBCUTANEOUS
  Filled 2017-09-29: qty 0.4

## 2017-09-29 NOTE — Patient Instructions (Signed)
[facility] at [facility] Discharge Instructions  Received Aranesp injection today. Follow-up as scheduled. Call clinic for any questions or concerns   Thank you for choosing [facility] at [facility] to provide your oncology and hematology care.  To afford each patient quality time with our provider, please arrive at least 15 minutes before your scheduled appointment time.   If you have a lab appointment with the Cancer Center please come in thru the  Main Entrance and check in at the main information desk  You need to re-schedule your appointment should you arrive 10 or more minutes late.  We strive to give you quality time with our providers, and arriving late affects you and other patients whose appointments are after yours.  Also, if you no show three or more times for appointments you may be dismissed from the clinic at the providers discretion.     Again, thank you for choosing [facility].  Our hope is that these requests will decrease the amount of time that you wait before being seen by our physicians.       _____________________________________________________________  Should you have questions after your visit to [facility], please contact our office at [phone] between the hours of 8:30 a.m. and 4:30 p.m.  Voicemails left after 4:30 p.m. will not be returned until the following business day.  For prescription refill requests, have your pharmacy contact our office.       Resources For Cancer Patients and their Caregivers ? American Cancer Society: Can assist with transportation, wigs, general needs, runs Look Good Feel Better.        1-888-227-6333 ? Cancer Care: Provides financial assistance, online support groups, medication/co-pay assistance.  1-800-813-HOPE (4673) ? [facility] Assists [place] cancer patients and their families through emotional , educational and  financial support.  336-427-4357 ? [place] DSS Where to apply for food stamps, Medicaid and utility assistance. 336-342-1394 ? [facility]: Transportation to medical appointments. 336-347-2287 ? Social Security Administration: May apply for disability if have a Stage IV cancer. 336-342-7796 1-800-772-1213 ? [place] Aging, Disability and Transit Services: Assists with nutrition, care and transit needs. 336-349-2343  Cancer Center Support Programs:   > Cancer Support Group  2nd Tuesday of the month 1pm-2pm, Journey Room   > Creative Journey  3rd Tuesday of the month 1130am-1pm, Journey Room    

## 2017-09-29 NOTE — Progress Notes (Signed)
Janet Fletcher tolerated Aranesp injection well without complaints or incident. Hgb 8.5 today. VSS Pt discharged self ambulatory in satisfactory condition

## 2017-09-30 NOTE — Progress Notes (Signed)
Referring Provider: Chevis Pretty, * Primary Care Physician:  Chevis Pretty, FNP Primary GI:  Dr. Oneida Alar  Chief Complaint  Patient presents with  . iron deficiency anemia    f/u, doing ok    HPI:   Janet Fletcher is a 82 y.o. female who presents for follow-up on iron deficiency anemia.  The patient was last seen in our office 04/03/2017 for the same as well as heme positive stool and H. pylori titers.  Initial referral by oncology due to small cell lung cancer and anemia, low point hemoglobin 8.9 on Aranesp and B12 injections.  Colonoscopy and EGD were completed. Colonoscopy was completed on 12/06/2016 which found no source for normocytic anemia, heme positive stools likely due to internal hemorrhoids. EGD completed same day found moderate gastritis due to NSAID use/possible H. pylori, status post gastric biopsies, normocytic anemia due to gastritis, chronic renal insufficiency, and chronic disease. Recommended avoid NSAIDs, high-fiber diet, follow-up in 3 months. Surgical pathology report reviewed. Gastric biopsies found positive for H. pylori. She was treated with Pylera for 10 days in addition to omeprazole.   His last visit she was doing well overall, stopped NSAIDs, energy improved, no further bleeding.  Has gained some weight after chemotherapy.  Appetite improving.  Chemotherapy and radiation completed 4-5 months prior.  No other GI symptoms.  Recommended continue current medications, H. pylori breath test to confirm eradication, follow-up in 6 months.  Her hemoglobins have been doing well in the 10-11.5 range over the previous 6 months.  However, it appears CBC was checked yesterday and found hemoglobin to be 8.5.  Her indices were normocytic, normochromic.  Her labs at the same time found normal iron, high saturation, high ferritin.  He was seen by the infusion clinic yesterday for Aranesp.  There appears to be a CBC pending recheck.  It does not appear H. pylori  breath test was ever completed.  Today she states she's doing well overall. Denies abdominal pain, n?V, hematochezia, melena, fever, chils, unintentional weight loss. Energy generally ok, some weakness with prolonged walking. States she saw oncology and has a test planned this week (review of Epic shows bone marrow bx schedule 10/03/17). Denies chest pain, dyspnea, dizziness, lightheadedness, syncope, near syncope. Denies any other upper or lower GI symptoms.  Past Medical History:  Diagnosis Date  . Allergy   . Cancer ([place])    lung  . Hyperlipidemia   . Iron deficiency anemia 12/15/2015  . Low vitamin B12 level 12/15/2015  . Osteopenia     Past Surgical History:  Procedure Laterality Date  . CATARACT EXTRACTION, BILATERAL Bilateral   . COLONOSCOPY N/A 12/06/2016   Procedure: COLONOSCOPY;  Surgeon: Danie Binder, MD;  [facility];  Service: Endoscopy;  Laterality: N/A;  1:45pm  . ESOPHAGOGASTRODUODENOSCOPY N/A 12/06/2016   Procedure: ESOPHAGOGASTRODUODENOSCOPY (EGD);  Surgeon: Danie Binder, MD;  [facility];  Service: Endoscopy;  Laterality: N/A;  . MINOR EXCISION EAR CANAL CYST Left    30+ years ago  . SMALL INTESTINE SURGERY    . VIDEO BRONCHOSCOPY WITH ENDOBRONCHIAL ULTRASOUND N/A 12/11/2015   Procedure: VIDEO BRONCHOSCOPY WITH ENDOBRONCHIAL ULTRASOUND;  Surgeon: Melrose Nakayama, MD;  Location: [facility];  Service: Thoracic;  Laterality: N/A;    Current Outpatient Medications  Medication Sig Dispense Refill  . atorvastatin (LIPITOR) 40 MG tablet Take 40 mg by mouth daily.     . cyanocobalamin (,VITAMIN B-12,) 1000 MCG/ML injection Inject 1 mL (1,000 mcg total)  into the muscle every 30 (thirty) days. 3 mL 1  . donepezil (ARICEPT) 10 MG tablet Take 1 tablet (10 mg total) by mouth at bedtime. 90 tablet 1   No current facility-administered medications for this visit.     Allergies as of 10/01/2017 - Review Complete 10/01/2017  Allergen Reaction Noted  .  Acetaminophen Other (See Comments) 10/11/2015  . Flagyl [metronidazole hcl] Nausea Only 08/05/2011  . Penicillins Other (See Comments) 08/05/2011    Family History  Problem Relation Age of Onset  . Cancer Mother   . Hypertension Father   . Colon cancer Neg Hx     Social History   Socioeconomic History  . Marital status: Married    Spouse name: None  . Number of children: None  . Years of education: None  . Highest education level: None  Social Needs  . Financial resource strain: None  . Food insecurity - worry: None  . Food insecurity - inability: None  . Transportation needs - medical: None  . Transportation needs - non-medical: None  Occupational History  . None  Tobacco Use  . Smoking status: Former Smoker    Packs/day: 0.25    Years: 50.00    Pack years: 12.50    Last attempt to quit: 01/08/2016    Years since quitting: 1.7  . Smokeless tobacco: Never Used  Substance and Sexual Activity  . Alcohol use: No  . Drug use: No  . Sexual activity: Not Currently  Other Topics Concern  . None  Social History Narrative  . None    Review of Systems: Complete ROS negative except as per HPI.   Physical Exam: BP 107/64   Pulse 81   Temp (!) 96.6 F (35.9 C) (Oral)   Ht 5\' 4"  (1.626 m)   Wt 141 lb (64 kg)   BMI 24.20 kg/m  General:   Alert and oriented. Pleasant and cooperative. Well-nourished and well-developed.  Head:  Normocephalic and atraumatic. Eyes:  Without icterus, sclera clear and conjunctiva pink.  Ears:  Normal auditory acuity. Cardiovascular:  S1, S2 present without murmurs appreciated. Extremities without clubbing or edema. Respiratory:  Clear to auscultation bilaterally. No wheezes, rales, or rhonchi. No distress.  Gastrointestinal:  +BS, soft, non-tender and non-distended. No HSM noted. No guarding or rebound. No masses appreciated.  Rectal:  Deferred  Musculoskalatal:  Symmetrical without gross deformities. Normal posture. Neurologic:  Alert  and oriented x4;  grossly normal neurologically. Psych:  Alert and cooperative. Normal mood and affect. Heme/Lymph/Immune: No excessive bruising noted.    10/01/2017 9:45 AM   Disclaimer: This note was dictated with voice recognition software. Similar sounding words can inadvertently be transcribed and may not be corrected upon review.

## 2017-10-01 ENCOUNTER — Ambulatory Visit: Payer: Medicare Other | Admitting: Nurse Practitioner

## 2017-10-01 ENCOUNTER — Encounter: Payer: Self-pay | Admitting: Nurse Practitioner

## 2017-10-01 VITALS — BP 107/64 | HR 81 | Temp 96.6°F | Ht 64.0 in | Wt 141.0 lb

## 2017-10-01 DIAGNOSIS — R195 Other fecal abnormalities: Secondary | ICD-10-CM | POA: Diagnosis not present

## 2017-10-01 DIAGNOSIS — D508 Other iron deficiency anemias: Secondary | ICD-10-CM | POA: Diagnosis not present

## 2017-10-01 NOTE — Patient Instructions (Addendum)
1. Continue your current medications. 2. Call us if you see any bleeding in your bowel movements. 3. Call us if you see any pitch black, tarry bowel movements. 4. Call us for any worsening GI symptoms. 5. Best of luck with your test this coming week! 6. Follow-up for 4 weeks. 7. Call us if you have any questions or concerns.   At [facility] we value your feedback. You may receive a survey about your visit today. Please share your experience as we strive to create trusing relationships with our patients to provide genuine, compassionate, quality care.

## 2017-10-01 NOTE — Progress Notes (Signed)
cc'ed to pcp °

## 2017-10-01 NOTE — Assessment & Plan Note (Signed)
Denies hematochezia and melena.  She did have a drop in her hemoglobin as per above.  No overt bleeding.  Iron normal, RBC indices normal.  Follow-up with oncology, return for follow-up here in 4 weeks.

## 2017-10-01 NOTE — Assessment & Plan Note (Signed)
History of iron deficiency anemia status post treatment for small cell lung cancer.  She was recently seen by oncology and found to have a hemoglobin that dropped to 8.5.  Over the previous several months her hemoglobins have actually been quite stable between 10-1/2 and 11-1/2.  Her indices are normal, iron levels are normal.  Denies on a couple occasions any overt GI bleeding.  She has had a recent upper endoscopy and colonoscopy.  There is potential for givens capsule endoscopy to wrap up GI workup for any bleeding.  However, she has a bone marrow aspiration scheduled for 3/15 and we will wait for that to be completed.  I will have her follow-up relatively soon in 4 weeks to discuss results and any need for capsule endoscopy.  I will also send a message to oncology to advise them that we are available to do this if it should be needed.  Otherwise continue current medications.  ER precautions given related to noted worsening, recommend call our office for any obvious bleeding or worsening symptoms.

## 2017-10-03 ENCOUNTER — Encounter (HOSPITAL_COMMUNITY): Payer: Self-pay

## 2017-10-03 ENCOUNTER — Ambulatory Visit (HOSPITAL_COMMUNITY)
Admission: RE | Admit: 2017-10-03 | Discharge: 2017-10-03 | Disposition: A | Discharge status: Home / Self Care | Payer: Medicare Other | Source: Ambulatory Visit | Attending: Internal Medicine | Admitting: Internal Medicine

## 2017-10-03 DIAGNOSIS — Z85118 Personal history of other malignant neoplasm of bronchus and lung: Secondary | ICD-10-CM | POA: Insufficient documentation

## 2017-10-03 DIAGNOSIS — Z881 Allergy status to other antibiotic agents status: Secondary | ICD-10-CM | POA: Insufficient documentation

## 2017-10-03 DIAGNOSIS — Z880 Allergy status to penicillin: Secondary | ICD-10-CM | POA: Insufficient documentation

## 2017-10-03 DIAGNOSIS — D649 Anemia, unspecified: Secondary | ICD-10-CM

## 2017-10-03 DIAGNOSIS — M8580 Other specified disorders of bone density and structure, unspecified site: Secondary | ICD-10-CM | POA: Insufficient documentation

## 2017-10-03 DIAGNOSIS — D7589 Other specified diseases of blood and blood-forming organs: Secondary | ICD-10-CM | POA: Insufficient documentation

## 2017-10-03 DIAGNOSIS — D61818 Other pancytopenia: Secondary | ICD-10-CM | POA: Diagnosis not present

## 2017-10-03 DIAGNOSIS — Z923 Personal history of irradiation: Secondary | ICD-10-CM | POA: Insufficient documentation

## 2017-10-03 DIAGNOSIS — E785 Hyperlipidemia, unspecified: Secondary | ICD-10-CM | POA: Insufficient documentation

## 2017-10-03 DIAGNOSIS — Z9221 Personal history of antineoplastic chemotherapy: Secondary | ICD-10-CM | POA: Insufficient documentation

## 2017-10-03 DIAGNOSIS — Z886 Allergy status to analgesic agent status: Secondary | ICD-10-CM | POA: Diagnosis not present

## 2017-10-03 DIAGNOSIS — C3491 Malignant neoplasm of unspecified part of right bronchus or lung: Secondary | ICD-10-CM

## 2017-10-03 DIAGNOSIS — D6189 Other specified aplastic anemias and other bone marrow failure syndromes: Secondary | ICD-10-CM

## 2017-10-03 DIAGNOSIS — Z79899 Other long term (current) drug therapy: Secondary | ICD-10-CM | POA: Insufficient documentation

## 2017-10-03 LAB — CBC WITH DIFFERENTIAL/PLATELET
Basophils Absolute: 0 10*3/uL (ref 0.0–0.1)
Basophils Relative: 1 %
EOS PCT: 1 %
Eosinophils Absolute: 0 10*3/uL (ref 0.0–0.7)
HCT: 25.2 % — ABNORMAL LOW (ref 36.0–46.0)
HEMOGLOBIN: 8 g/dL — AB (ref 12.0–15.0)
LYMPHS PCT: 21 %
Lymphs Abs: 0.5 10*3/uL — ABNORMAL LOW (ref 0.7–4.0)
MCH: 27.7 pg (ref 26.0–34.0)
MCHC: 31.7 g/dL (ref 30.0–36.0)
MCV: 87.2 fL (ref 78.0–100.0)
MONO ABS: 0.3 10*3/uL (ref 0.1–1.0)
Monocytes Relative: 13 %
NEUTROS PCT: 64 %
Neutro Abs: 1.6 10*3/uL — ABNORMAL LOW (ref 1.7–7.7)
PLATELETS: 74 10*3/uL — AB (ref 150–400)
RBC: 2.89 MIL/uL — AB (ref 3.87–5.11)
RDW: 22.6 % — ABNORMAL HIGH (ref 11.5–15.5)
WBC: 2.4 10*3/uL — AB (ref 4.0–10.5)

## 2017-10-03 LAB — BASIC METABOLIC PANEL
ANION GAP: 9 (ref 5–15)
BUN: 29 mg/dL — ABNORMAL HIGH (ref 6–20)
CHLORIDE: 104 mmol/L (ref 101–111)
CO2: 28 mmol/L (ref 22–32)
Calcium: 8.8 mg/dL — ABNORMAL LOW (ref 8.9–10.3)
Creatinine, Ser: 1.26 mg/dL — ABNORMAL HIGH (ref 0.44–1.00)
GFR calc Af Amer: 46 mL/min — ABNORMAL LOW (ref >60)
GFR calc non Af Amer: 39 mL/min — ABNORMAL LOW (ref >60)
Glucose, Bld: 95 mg/dL (ref 65–99)
POTASSIUM: 4 mmol/L (ref 3.5–5.1)
SODIUM: 141 mmol/L (ref 135–145)

## 2017-10-03 LAB — PROTIME-INR
INR: 1.09
Prothrombin Time: 14 seconds (ref 11.4–15.2)

## 2017-10-03 MED ORDER — FENTANYL CITRATE (PF) 100 MCG/2ML IJ SOLN
INTRAMUSCULAR | Status: AC | PRN
  Administered 2017-10-03: 50 ug via INTRAVENOUS

## 2017-10-03 MED ORDER — LIDOCAINE HCL (PF) 1 % IJ SOLN
INTRAMUSCULAR | Status: AC | PRN
  Administered 2017-10-03: 20 mL

## 2017-10-03 MED ORDER — MIDAZOLAM HCL 2 MG/2ML IJ SOLN
INTRAMUSCULAR | Status: AC
  Filled 2017-10-03: qty 2

## 2017-10-03 MED ORDER — FENTANYL CITRATE (PF) 100 MCG/2ML IJ SOLN
INTRAMUSCULAR | Status: AC
  Filled 2017-10-03: qty 2

## 2017-10-03 MED ORDER — HYDROCODONE-ACETAMINOPHEN 5-325 MG PO TABS: 1.0000 | ORAL_TABLET | ORAL | Status: DC | PRN

## 2017-10-03 MED ORDER — SODIUM CHLORIDE 0.9 % IV SOLN
INTRAVENOUS | Status: DC
  Administered 2017-10-03: 07:00:00 via INTRAVENOUS

## 2017-10-03 MED ORDER — SODIUM CHLORIDE 0.9 % IV SOLN
INTRAVENOUS | Status: AC
  Filled 2017-10-03: qty 250

## 2017-10-03 MED ORDER — MIDAZOLAM HCL 2 MG/2ML IJ SOLN
INTRAMUSCULAR | Status: AC | PRN
  Administered 2017-10-03: 1 mg via INTRAVENOUS

## 2017-10-03 NOTE — Discharge Instructions (Signed)
Moderate Conscious Sedation, Adult, Care After °These instructions provide you with information about caring for yourself after your procedure. Your health care provider may also give you more specific instructions. Your treatment has been planned according to current medical practices, but problems sometimes occur. Call your health care provider if you have any problems or questions after your procedure. °What can I expect after the procedure? °After your procedure, it is common: °· To feel sleepy for several hours. °· To feel clumsy and have poor balance for several hours. °· To have poor judgment for several hours. °· To vomit if you eat too soon. ° °Follow these instructions at home: °For at least 24 hours after the procedure: ° °· Do not: °? Participate in activities where you could fall or become injured. °? Drive. °? Use heavy machinery. °? Drink alcohol. °? Take sleeping pills or medicines that cause drowsiness. °? Make important decisions or sign legal documents. °? Take care of children on your own. °· Rest. °Eating and drinking °· Follow the diet recommended by your health care provider. °· If you vomit: °? Drink water, juice, or soup when you can drink without vomiting. °? Make sure you have little or no nausea before eating solid foods. °General instructions °· Have a responsible adult stay with you until you are awake and alert. °· Take over-the-counter and prescription medicines only as told by your health care provider. °· If you smoke, do not smoke without supervision. °· Keep all follow-up visits as told by your health care provider. This is important. °Contact a health care provider if: °· You keep feeling nauseous or you keep vomiting. °· You feel light-headed. °· You develop a rash. °· You have a fever. °Get help right away if: °· You have trouble breathing. °This information is not intended to replace advice given to you by your health care provider. Make sure you discuss any questions you have  with your health care provider. °Document Released: 04/28/2013 Document Revised: 12/11/2015 Document Reviewed: 10/28/2015 °Elsevier Interactive Patient Education © 2018 Elsevier Inc. °Bone Marrow Aspiration and Bone Marrow Biopsy, Adult, Care After °This sheet gives you information about how to care for yourself after your procedure. Your health care provider may also give you more specific instructions. If you have problems or questions, contact your health care provider. °What can I expect after the procedure? °After the procedure, it is common to have: °· Mild pain and tenderness. °· Swelling. °· Bruising. ° °Follow these instructions at home: °· Take over-the-counter or prescription medicines only as told by your health care provider. °· Do not take baths, swim, or use a hot tub until your health care provider approves. Ask if you can take a shower or have a sponge bath. °· Follow instructions from your health care provider about how to take care of the puncture site. Make sure you: °? Wash your hands with soap and water before you change your bandage (dressing). If soap and water are not available, use hand sanitizer. °? Change your dressing as told by your health care provider. °· Check your puncture site every day for signs of infection. Check for: °? More redness, swelling, or pain. °? More fluid or blood. °? Warmth. °? Pus or a bad smell. °· Return to your normal activities as told by your health care provider. Ask your health care provider what activities are safe for you. °· Do not drive for 24 hours if you were given a medicine to help you relax (sedative). °·   Keep all follow-up visits as told by your health care provider. This is important. °Contact a health care provider if: °· You have more redness, swelling, or pain around the puncture site. °· You have more fluid or blood coming from the puncture site. °· Your puncture site feels warm to the touch. °· You have pus or a bad smell coming from the  puncture site. °· You have a fever. °· Your pain is not controlled with medicine. °This information is not intended to replace advice given to you by your health care provider. Make sure you discuss any questions you have with your health care provider. °Document Released: 01/25/2005 Document Revised: 01/26/2016 Document Reviewed: 12/20/2015 °Elsevier Interactive Patient Education © 2018 Elsevier Inc. ° °

## 2017-10-03 NOTE — H&P (Addendum)
Referring Physician(s): Higgs,Vetta  Supervising Physician: Arne Cleveland  Patient Status:  [facility] OP  Chief Complaint: "I'm here for a bone marrow biopsy"   Subjective: Patient familiar to IR service from prior Port-A-Cath placement in 2017.  She has a history of small cell lung cancer diagnosed in 2017, status post chemoradiation.  She now has pancytopenia of unknown etiology and presents today for CT-guided bone marrow biopsy for further evaluation.  She currently denies fever, headache, chest pain, dyspnea, cough, abdominal/back pain, nausea, vomiting or bleeding. Past Medical History:  Diagnosis Date  . Allergy   . Cancer ([place])    lung  . Hyperlipidemia   . Iron deficiency anemia 12/15/2015  . Low vitamin B12 level 12/15/2015  . Osteopenia    Past Surgical History:  Procedure Laterality Date  . CATARACT EXTRACTION, BILATERAL Bilateral   . COLONOSCOPY N/A 12/06/2016   Procedure: COLONOSCOPY;  Surgeon: Danie Binder, MD;  [facility];  Service: Endoscopy;  Laterality: N/A;  1:45pm  . ESOPHAGOGASTRODUODENOSCOPY N/A 12/06/2016   Procedure: ESOPHAGOGASTRODUODENOSCOPY (EGD);  Surgeon: Danie Binder, MD;  Location[facility];  Service: Endoscopy;  Laterality: N/A;  . MINOR EXCISION EAR CANAL CYST Left    30+ years ago  . SMALL INTESTINE SURGERY    . VIDEO BRONCHOSCOPY WITH ENDOBRONCHIAL ULTRASOUND N/A 12/11/2015   Procedure: VIDEO BRONCHOSCOPY WITH ENDOBRONCHIAL ULTRASOUND;  Surgeon: Melrose Nakayama, MD;  Location: [facility] OR;  Service: Thoracic;  Laterality: N/A;     Allergies: Acetaminophen; Flagyl [metronidazole hcl]; and Penicillins  Medications: Prior to Admission medications   Medication Sig Start Date End Date Taking? Authorizing Provider  atorvastatin (LIPITOR) 40 MG tablet Take 40 mg by mouth daily.     [provider]  cyanocobalamin (,VITAMIN B-12,) 1000 MCG/ML injection Inject 1 mL (1,000 mcg total) into the muscle every 30 (thirty)  days. 08/07/17   Hassell Done, Mary-Margaret, FNP  donepezil (ARICEPT) 10 MG tablet Take 1 tablet (10 mg total) by mouth at bedtime. 08/07/17   Hassell Done Mary-Margaret, FNP     Vital Signs: BP 131/61 (BP Location: Right Arm)   Pulse 80   Temp 97.9 F (36.6 C) (Oral)   Resp 16   SpO2 100%   Physical Exam awake, alert.  Chest with distant breath sounds bilaterally.  Right chest wall Port-A-Cath; heart with regular rate and rhythm.  Abdomen soft, positive bowel sounds, nontender.  No lower extremity edema.  Imaging: No results found.  Labs:  CBC: Recent Labs    09/01/17 0843 09/15/17 0752 09/29/17 1044 10/03/17 0723  WBC 2.7* 2.6* 2.2* 2.4*  HGB 11.0* 10.4* 8.5* 8.0*  HCT 35.9* 33.8* 27.5* 25.2*  PLT 67* 67* 65* 74*    COAGS: Recent Labs    10/03/17 0723  INR 1.09    BMP: Recent Labs    08/18/17 0838 09/01/17 0843 09/29/17 1044 10/03/17 0723  NA 142 140 140 141  K 3.8 4.0 4.0 4.0  CL 109 105 105 104  CO2 '26 27 28 28  ' GLUCOSE 100* 93 98 95  BUN 22* 19 26* 29*  CALCIUM 8.5* 9.0 8.9 8.8*  CREATININE 1.06* 1.11* 1.21* 1.26*  GFRNONAA 49* 46* 41* 39*  GFRAA 56* 53* 48* 46*    LIVER FUNCTION TESTS: Recent Labs    08/04/17 1044 08/18/17 0838 09/01/17 0843 09/29/17 1044  BILITOT 0.7 0.7 0.8 0.8  AST '22 21 22 22  ' ALT 12* 14 11* 13*  ALKPHOS 75 72 76 68  PROT 7.0 6.3* 6.7 6.5  ALBUMIN 3.7 3.4* 3.6 3.5    Assessment and Plan: Pt with history of small cell lung cancer diagnosed in 2017, status post chemoradiation.  She now has pancytopenia of unknown etiology and presents today for CT-guided bone marrow biopsy for further evaluation.Risks and benefits discussed with the patient including, but not limited to bleeding, infection, damage to adjacent structures or low yield requiring additional tests.  All of the patient's questions were answered, patient is agreeable to proceed. Consent signed and in chart.     Electronically Signed: D. Rowe Robert,  PA-C 10/03/2017, 8:19 AM   I spent a total of 20 minutes at the the patient's bedside AND on the patient's hospital floor or unit, greater than 50% of which was counseling/coordinating care for CT guided bone marrow biopsy

## 2017-10-03 NOTE — Procedures (Signed)
  Procedure: CT bone marrow biopsy R iliac EBL:   minimal Complications:  none immediate  See full dictation in [facility].  Dillard Cannon MD Main # [phone] Pager  [phone]

## 2017-10-06 ENCOUNTER — Ambulatory Visit (HOSPITAL_COMMUNITY): Payer: Medicare Other | Admitting: Internal Medicine

## 2017-10-13 ENCOUNTER — Ambulatory Visit (HOSPITAL_COMMUNITY): Payer: Medicare Other | Admitting: Hematology

## 2017-10-13 ENCOUNTER — Inpatient Hospital Stay (HOSPITAL_BASED_OUTPATIENT_CLINIC_OR_DEPARTMENT_OTHER): Payer: Medicare Other | Admitting: Internal Medicine

## 2017-10-13 ENCOUNTER — Encounter (HOSPITAL_COMMUNITY): Payer: Self-pay | Admitting: Internal Medicine

## 2017-10-13 ENCOUNTER — Other Ambulatory Visit: Payer: Self-pay

## 2017-10-13 ENCOUNTER — Other Ambulatory Visit (HOSPITAL_COMMUNITY): Payer: Medicare Other

## 2017-10-13 ENCOUNTER — Inpatient Hospital Stay (HOSPITAL_COMMUNITY): Payer: Medicare Other

## 2017-10-13 ENCOUNTER — Ambulatory Visit (HOSPITAL_COMMUNITY): Payer: Medicare Other

## 2017-10-13 ENCOUNTER — Encounter (HOSPITAL_COMMUNITY): Payer: Self-pay

## 2017-10-13 ENCOUNTER — Other Ambulatory Visit (HOSPITAL_COMMUNITY): Payer: Self-pay | Admitting: Internal Medicine

## 2017-10-13 VITALS — BP 129/50 | HR 89 | Temp 97.9°F | Resp 20 | Wt 147.3 lb

## 2017-10-13 VITALS — BP 117/62 | HR 70 | Temp 97.7°F | Resp 18

## 2017-10-13 DIAGNOSIS — C3491 Malignant neoplasm of unspecified part of right bronchus or lung: Secondary | ICD-10-CM

## 2017-10-13 DIAGNOSIS — D696 Thrombocytopenia, unspecified: Secondary | ICD-10-CM | POA: Diagnosis not present

## 2017-10-13 DIAGNOSIS — D638 Anemia in other chronic diseases classified elsewhere: Secondary | ICD-10-CM

## 2017-10-13 DIAGNOSIS — D469 Myelodysplastic syndrome, unspecified: Secondary | ICD-10-CM

## 2017-10-13 DIAGNOSIS — N189 Chronic kidney disease, unspecified: Principal | ICD-10-CM

## 2017-10-13 DIAGNOSIS — D72819 Decreased white blood cell count, unspecified: Secondary | ICD-10-CM

## 2017-10-13 DIAGNOSIS — D631 Anemia in chronic kidney disease: Secondary | ICD-10-CM

## 2017-10-13 DIAGNOSIS — D649 Anemia, unspecified: Secondary | ICD-10-CM

## 2017-10-13 LAB — CBC WITH DIFFERENTIAL/PLATELET
Basophils Absolute: 0 10*3/uL (ref 0.0–0.1)
Basophils Relative: 1 %
EOS PCT: 2 %
Eosinophils Absolute: 0.1 10*3/uL (ref 0.0–0.7)
HEMATOCRIT: 20.6 % — AB (ref 36.0–46.0)
HEMOGLOBIN: 6.5 g/dL — AB (ref 12.0–15.0)
Lymphocytes Relative: 27 %
Lymphs Abs: 0.7 10*3/uL (ref 0.7–4.0)
MCH: 27 pg (ref 26.0–34.0)
MCHC: 31.6 g/dL (ref 30.0–36.0)
MCV: 85.5 fL (ref 78.0–100.0)
MONO ABS: 0.3 10*3/uL (ref 0.1–1.0)
MONOS PCT: 13 %
NEUTROS ABS: 1.5 10*3/uL (ref 1.7–7.7)
Neutrophils Relative %: 57 %
Platelets: 59 10*3/uL — ABNORMAL LOW (ref 150–400)
RBC: 2.41 MIL/uL — ABNORMAL LOW (ref 3.87–5.11)
RDW: 22.1 % — AB (ref 11.5–15.5)
WBC: 2.6 10*3/uL — ABNORMAL LOW (ref 4.0–10.5)

## 2017-10-13 LAB — PREPARE RBC (CROSSMATCH)

## 2017-10-13 MED ORDER — DIPHENHYDRAMINE HCL 25 MG PO CAPS
25.0000 mg | ORAL_CAPSULE | Freq: Once | ORAL | Status: AC
  Administered 2017-10-13: 25 mg via ORAL
  Filled 2017-10-13: qty 1

## 2017-10-13 MED ORDER — ACETAMINOPHEN 325 MG PO TABS
650.0000 mg | ORAL_TABLET | Freq: Once | ORAL | Status: AC
  Administered 2017-10-13: 650 mg via ORAL
  Filled 2017-10-13: qty 2

## 2017-10-13 MED ORDER — SODIUM CHLORIDE 0.9 % IV SOLN
250.0000 mL | Freq: Once | INTRAVENOUS | Status: AC
  Administered 2017-10-13: 250 mL via INTRAVENOUS

## 2017-10-13 MED ORDER — HEPARIN SOD (PORK) LOCK FLUSH 100 UNIT/ML IV SOLN
500.0000 [IU] | Freq: Every day | INTRAVENOUS | Status: AC | PRN
  Administered 2017-10-13: 500 [IU]

## 2017-10-13 NOTE — Progress Notes (Signed)
Tolerated transfusion w/o adverse reaction.  Alert, in no distress.  VSS.  Discharged ambulatory.

## 2017-10-13 NOTE — Patient Instructions (Addendum)
[place] at [facility] Discharge Instructions  You were seen today by Dr. Walden Field. She went over your recent bone marrow biopsy and lab results. Some of the tests are still pending. She wants you to get 2 units of blood today. Return as scheduled.   Thank you for choosing [place] at [facility] to provide your oncology and hematology care.  To afford each patient quality time with our provider, please arrive at least 15 minutes before your scheduled appointment time.   If you have a lab appointment with the [facility] please come in thru the  Main Entrance and check in at the main information desk  You need to re-schedule your appointment should you arrive 10 or more minutes late.  We strive to give you quality time with our providers, and arriving late affects you and other patients whose appointments are after yours.  Also, if you no show three or more times for appointments you may be dismissed from the clinic at the providers discretion.     Again, thank you for choosing [facility]  Our hope is that these requests will decrease the amount of time that you wait before being seen by our physicians.       _____________________________________________________________  Should you have questions after your visit to [facility], please contact our office at [phone] between the hours of 8:30 a.m. and 4:30 p.m.  Voicemails left after 4:30 p.m. will not be returned until the following business day.  For prescription refill requests, have your pharmacy contact our office.       Resources For Cancer Patients and their Caregivers ? American Cancer Society: Can assist with transportation, wigs, general needs, runs Look Good Feel Better.        580 688 6736 ? Cancer Care: Provides financial assistance, online support groups, medication/co-pay assistance.  1-800-813-HOPE 518-015-2683) ? [place] Assists [place] cancer patients and their families through emotional , educational and financial support.  480-275-8239 ? [place] DSS Where to apply for food stamps, Medicaid and utility assistance. 319-638-0543 ? [facility]: Transportation to medical appointments. 775 054 2823 ? Social Security Administration: May apply for disability if have a Stage IV cancer. 705-283-2610 3528589023 ? [employer], Disability and Transit Services: Assists with nutrition, care and transit needs. [place] Support Programs:   > Cancer Support Group  2nd Tuesday of the month 1pm-2pm, Journey Room   > Creative Journey  3rd Tuesday of the month 1130am-1pm, Journey Room

## 2017-10-13 NOTE — Progress Notes (Signed)
Diagnosis Myelodysplastic syndrome ([place]) - Plan: CBC with Differential/Platelet, Comprehensive metabolic panel, Lactate dehydrogenase  Staging Cancer Staging No matching staging information was found for the patient.  Assessment and Plan:   1.  Limited stage small cell lung cancer: She had CT chest performed July 02, 2017 which showed no new or progressive disease.   She will be set up for repeat imaging in June 2019 and will follow up to go over the results of her imaging at that time.   2.  MDS.  Patient has been followed by Dr. Talbert Cage.  She was on Aranesp 200 mcg every 2 weeks.  She was also on monthly B12 injections. Previously, I discussed with the family and pt that patient's counts remain decreased and she has evidence of leukopenia and thrombocytopenia.  Based on her history I have discussed with the patient and her family recommendations for bone marrow aspirate and biopsy for definitive diagnosis.    Pt underwent bone marrow biopsy on 10/03/2017.  Findings are suspicious for myelodysplastic syndrome.  Cytogenetics and FISH are pending.  She will return to clinic in 1 week to go over her final results.  Discussion will be held with the patient at that time concerning options of Vidaza versus Revlimid.  Due to HB being decreased today 10/13/2017  at 6.8, she will be transfused with 2 u PRBCS.  She will RTC 10/20/2017 for follow-up and repeat labs.    3.  Anemia.  HB decreased today 09/22/2017  at 6.8, she will be transfused with 2 u PRBCS.  She will RTC 10/20/2017 for follow-up and repeat labs.    4.  Leukopenia and thrombocytopenia.  Likely due to MDS.  WBC 2.6 and plts 59,000 on labs done today 10/13/2017.   Will repeat labs on RTC 10/20/2017.     Interval history:  Limited stage small cell lung cancer.  Patient was treated with chemotherapy and radiation and PCI completed in 2017.  She was followed by Dr. Talbert Cage for ongoing pancytopenia.    Current status:  Patient is seen today for follow-up.   Pt remains fatigued.  She is here to go over labs and recent bone marrow biopsy.  She reports she tolerated the procedure.      Small cell carcinoma of right lung ([place])   11/28/2015 Imaging    Large anterior mediastinal mass in continuity with a R hilar mass, narrowing of SVC, abnormal densities in RUL, 9 mm spiculated nodule, nonspec. hypodensity in liver      12/05/2015 PET scan    Large hypermetabolic paratracheal mass c/w SCLC, perihilar nodular densities in RML, mild metabolic activity RUL nodule, no distant metastatic disease      12/11/2015 Procedure    Video bronch with biopsies and brushings, endobronchial ultrasound with mediastinal LN aspiration. Dr. Roxan Hockey      12/11/2015 Pathology Results    Trachea biopsy negative, FNA RUL malignant cells c/w SCLC      12/13/2015 - 03/15/2016 Chemotherapy    Cisplatin/Etoposide x 5 cycles.  Cycle #6 cancelled due to significant cytopenias      12/25/2015 Imaging    MRI brain- No intracranial parenchymal enhancing lesion. Tiny right frontal calvarial enhancing lesion. Small metastatic lesion not excluded although this may represent an incidentally detected benign process.      12/29/2015 - 02/13/2016 Radiation Therapy    XRT      02/14/2016 Treatment Plan Change    Chemotherapy deferred x 1 week      02/21/2016 Treatment  Plan Change    Carboplatin dose reduced by 20% and Etoposide dose reduced by 10%.      04/24/2016 Imaging    CT CAP- Chest Impression:  1. Marked reduction in mediastinal and RIGHT hilar lymphadenopathy with no residual pathologically enlarged nodes. 2. Resolution of perihilar nodularity in the RIGHT lung which was hypermetabolic on FDG PET. 3. Persistent nodule in the RIGHT upper lobe which is not hypermetabolic. 4. New ground-glass nodule in the RIGHT upper lobe is nonspecific and may relate to therapy.  Abdomen / Pelvis Impression:  1. No evidence of metastatic disease in the abdomen pelvis. 2.  Atherosclerotic calcification of the aorta.      04/24/2016 Imaging    Bone scan- Nonspecific increased tracer localization at approximately T11 vertebral body; this is of uncertain etiology, could represent a degenerative process though metastatic disease is not excluded.      04/29/2016 -  Radiation Therapy    PCI -- 25 Gy      07/24/2016 Imaging    CT CAP- 1. There has been interval decrease in size of mediastinal lymph nodes. The index lesion within the right upper lobe is also decreased in size from previous exam. 2. New subpleural nodule is noted within the posteromedial right lower lobe. Attention on follow-up imaging advised. 3. Stable ground-glass attenuating nodule within the right lower lobe and anteromedial left upper lobe 4. Aortic atherosclerosis and coronary artery calcification      07/29/2016 Imaging    MRI t-spine: 1. Stable benign slightly enhancing sclerotic lesion in the right posterolateral aspect of the T11 vertebral body, unchanged for 5 years. This is felt to account for the subtle increased activity at T11 on the SPECT bone scan. 2. Slight degenerative disc disease with small disc protrusions at T1-2 and T2-3 and T12-L1 with no neural impingement. 3. No evidence of metastatic disease.      09/23/2016 Imaging    CT chest- Radiation changes in the central right upper lobe/ perihilar region.  Stable small mediastinal lymph nodes, unchanged.  No findings suspicious for new/progressive metastatic disease.      12/25/2016 Imaging    CT C/A/P: IMPRESSION: 1. Continued further decrease in mediastinal lymph nodes. 2. Index nodule antero medial right upper lobe has decreased. The new 5 mm right lower lobe subpleural nodule seen on the previous study has resolved in the interval. Ground-glass nodules in the upper lobes bilaterally are stable. 3. Interval development of interstitial and alveolar opacity in the parahilar right lung is presumably radiation  related. Attention on follow-up recommended. 4. New tiny right pleural effusion with areas of apparent pleural enhancement in the posterior right costophrenic sulcus. Close attention on follow-up recommended. 5. Marked abdominal aortic atherosclerosis with likely infrarenal significant stenosis.       07/02/2017 Imaging    CT Chest w/ contrast: Resolution of central right lower lobe infiltrate and right pleural effusion since prior study.  Stable ground-glass opacities in both upper lobes.  No new or progressive disease        Problem List Patient Active Problem List   Diagnosis Date Noted  . Anemia in chronic kidney disease (CKD) [N18.9, D63.1] 09/20/2017  . Mild memory disturbance [R41.3] 06/05/2017  . Helicobacter pylori gastritis [K29.70, B96.81] 04/03/2017  . Heme + stool [R19.5] 11/07/2016  . Tobacco abuse [Z72.0] 12/15/2015  . Absolute anemia [D64.9] 12/15/2015  . Low vitamin B12 level [E53.8] 12/15/2015  . Iron deficiency anemia [D50.9] 12/15/2015  . Small cell carcinoma of right lung (  [place]) [C34.91] 12/07/2015  . 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 Combined forms of age-related cataract of left eye [H25.812] 12/15/2014  . Hyperlipidemia with target LDL less than 100 [E78.5] 09/29/2014  . GAD (generalized anxiety disorder) [F41.1] 09/29/2014    Past Medical History Past Medical History:  Diagnosis Date  . Allergy   . Cancer ([place])    lung  . Hyperlipidemia   . Iron deficiency anemia 12/15/2015  . Low vitamin B12 level 12/15/2015  . Osteopenia     Past Surgical History Past Surgical History:  Procedure Laterality Date  . CATARACT EXTRACTION, BILATERAL Bilateral   . COLONOSCOPY N/A 12/06/2016   Procedure: COLONOSCOPY;  Surgeon: Danie Binder, MD;  [facility];  Service: Endoscopy;  Laterality: N/A;  1:45pm  . ESOPHAGOGASTRODUODENOSCOPY N/A 12/06/2016   Procedure: ESOPHAGOGASTRODUODENOSCOPY (EGD);   Surgeon: Danie Binder, MD;  [facility];  Service: Endoscopy;  Laterality: N/A;  . MINOR EXCISION EAR CANAL CYST Left    30+ years ago  . SMALL INTESTINE SURGERY    . VIDEO BRONCHOSCOPY WITH ENDOBRONCHIAL ULTRASOUND N/A 12/11/2015   Procedure: VIDEO BRONCHOSCOPY WITH ENDOBRONCHIAL ULTRASOUND;  Surgeon: Melrose Nakayama, MD;  Location: [facility] OR;  Service: Thoracic;  Laterality: N/A;    Family History Family History  Problem Relation Age of Onset  . Cancer Mother   . Hypertension Father   . Colon cancer Neg Hx      Social History  reports that she quit smoking about 21 months ago. She has a 12.50 pack-year smoking history. She has never used smokeless tobacco. She reports that she does not drink alcohol or use drugs.  Medications  Current Outpatient Medications:  .  atorvastatin (LIPITOR) 40 MG tablet, Take 40 mg by mouth daily. , Disp: , Rfl:  .  cyanocobalamin (,VITAMIN B-12,) 1000 MCG/ML injection, Inject 1 mL (1,000 mcg total) into the muscle every 30 (thirty) days., Disp: 3 mL, Rfl: 1 .  donepezil (ARICEPT) 10 MG tablet, Take 1 tablet (10 mg total) by mouth at bedtime., Disp: 90 tablet, Rfl: 1  Allergies Acetaminophen; Flagyl [metronidazole hcl]; and Penicillins  Review of Systems Review of Systems - Oncology ROS as per HPI otherwise 12 point ROS is negative.   Physical Exam  Vitals Wt Readings from Last 3 Encounters:  10/13/17 147 lb 4.8 oz (66.8 kg)  10/03/17 140 lb (63.5 kg)  10/01/17 141 lb (64 kg)   Temp Readings from Last 3 Encounters:  10/13/17 97.9 F (36.6 C) (Oral)  10/03/17 98.6 F (37 C) (Oral)  10/01/17 (!) 96.6 F (35.9 C) (Oral)   BP Readings from Last 3 Encounters:  10/13/17 (!) 129/50  10/03/17 115/64  10/01/17 107/64   Pulse Readings from Last 3 Encounters:  10/13/17 89  10/03/17 85  10/01/17 81   Constitutional: Well-developed, well-nourished, and in no distress.   HENT: Head: Normocephalic and atraumatic.   Mouth/Throat: No oropharyngeal exudate. Mucosa moist. Eyes: Pupils are equal, round, and reactive to light. Conjunctivae are normal. No scleral icterus.  Neck: Normal range of motion. Neck supple. No JVD present.  Cardiovascular: Normal rate, regular rhythm and normal heart sounds.  Exam reveals no gallop and no friction rub.   No murmur heard. Pulmonary/Chest: Effort normal and breath sounds normal. No respiratory distress. No wheezes.No rales.  Abdominal: Soft. Bowel sounds are normal. No distension. There is no tenderness. There is no guarding.  Musculoskeletal: No edema or tenderness.  Lymphadenopathy:  No cervical, axillary or supraclavicular adenopathy.  Neurological: Alert and oriented to person, place, and time. No cranial nerve deficit.  Skin: Skin is warm and dry. No rash noted. No erythema. No pallor.  Psychiatric: Affect and judgment normal.   Labs Lab on 10/13/2017  Component Date Value Ref Range Status  . WBC 10/13/2017 2.6* 4.0 - 10.5 K/uL Final   Comment: WHITE COUNT CONFIRMED ON SMEAR ADJUSTED FOR NUCLEATED RBC'S   . RBC 10/13/2017 2.41* 3.87 - 5.11 MIL/uL Final  . Hemoglobin 10/13/2017 6.5* 12.0 - 15.0 g/dL Final   Comment: REPEATED TO VERIFY CRITICAL RESULT CALLED TO, READ BACK BY AND VERIFIED WITH: RCOCKRAM AT 0925 BY HFLYNT 10/13/17   . HCT 10/13/2017 20.6* 36.0 - 46.0 % Final  . MCV 10/13/2017 85.5  78.0 - 100.0 fL Final  . MCH 10/13/2017 27.0  26.0 - 34.0 pg Final  . MCHC 10/13/2017 31.6  30.0 - 36.0 g/dL Final  . RDW 10/13/2017 22.1* 11.5 - 15.5 % Final  . Platelets 10/13/2017 59* 150 - 400 K/uL Final   Comment: PLATELET COUNT CONFIRMED BY SMEAR SPECIMEN CHECKED FOR CLOTS GIANT PLATELETS SEEN   . Neutrophils Relative % 10/13/2017 57  % Final  . Neutro Abs 10/13/2017 1.5  1.7 - 7.7 K/uL Final  . Lymphocytes Relative 10/13/2017 27  % Final  . Lymphs Abs 10/13/2017 0.7  0.7 - 4.0 K/uL Final  . Monocytes Relative 10/13/2017 13  % Final  . Monocytes  Absolute 10/13/2017 0.3  0.1 - 1.0 K/uL Final  . Eosinophils Relative 10/13/2017 2  % Final  . Eosinophils Absolute 10/13/2017 0.1  0.0 - 0.7 K/uL Final  . Basophils Relative 10/13/2017 1  % Final  . Basophils Absolute 10/13/2017 0.0  0.0 - 0.1 K/uL Final  . RBC Morphology 10/13/2017 ANISOCYTES   Final   Comment: Schistocytes present RARE NRBCs Performed at [facility], [address], [place], [place] [address]      Pathology Orders Placed This Encounter  Procedures  . CBC with Differential/Platelet    Standing Status:   Future    Standing Expiration Date:   10/14/2018  . Comprehensive metabolic panel    Standing Status:   Future    Standing Expiration Date:   10/14/2018  . Lactate dehydrogenase    Standing Status:   Future    Standing Expiration Date:   10/14/2018       Zoila Shutter MD

## 2017-10-13 NOTE — Progress Notes (Unsigned)
CRITICAL VALUE ALERT  Critical Value:  Hgb 6.5    Date & Time Notied:  10/13/17  Provider Notified:  Dr Raliegh Ip.

## 2017-10-14 LAB — BPAM RBC
BLOOD PRODUCT EXPIRATION DATE: 201904092359
BLOOD PRODUCT EXPIRATION DATE: 201904102359
ISSUE DATE / TIME: 201903251157
ISSUE DATE / TIME: 201903251336
UNIT TYPE AND RH: 6200
UNIT TYPE AND RH: 6200

## 2017-10-14 LAB — TYPE AND SCREEN
ABO/RH(D): A POS
ANTIBODY SCREEN: NEGATIVE
UNIT DIVISION: 0
Unit division: 0

## 2017-10-16 ENCOUNTER — Encounter: Payer: Self-pay | Admitting: Nurse Practitioner

## 2017-10-16 ENCOUNTER — Ambulatory Visit (INDEPENDENT_AMBULATORY_CARE_PROVIDER_SITE_OTHER): Payer: Medicare Other | Admitting: Nurse Practitioner

## 2017-10-16 ENCOUNTER — Ambulatory Visit (INDEPENDENT_AMBULATORY_CARE_PROVIDER_SITE_OTHER): Payer: Medicare Other

## 2017-10-16 VITALS — BP 106/66 | HR 90 | Temp 96.0°F | Ht 64.0 in | Wt 143.0 lb

## 2017-10-16 DIAGNOSIS — J069 Acute upper respiratory infection, unspecified: Secondary | ICD-10-CM

## 2017-10-16 DIAGNOSIS — R05 Cough: Secondary | ICD-10-CM

## 2017-10-16 DIAGNOSIS — R059 Cough, unspecified: Secondary | ICD-10-CM

## 2017-10-16 MED ORDER — AZITHROMYCIN 250 MG PO TABS: ORAL_TABLET | ORAL | 0 refills | Status: DC

## 2017-10-16 NOTE — Progress Notes (Signed)
   Subjective:    Patient ID: Janet Fletcher, female    DOB: 11-22-1935, 82 y.o.   MRN: [id number]  HPI  Patient brought in today by her daughter with c/o cough and congestion for over a week. Is some better but not much.  * had bine marrow bx 3 weeks ago, then had to have blood transfusion last week so has had exposure to sick people at hospital. She has smal cell lung cancer.  Review of Systems  Constitutional: Positive for chills. Negative for fever.  HENT: Positive for congestion and rhinorrhea. Negative for sore throat and trouble swallowing.   Respiratory: Positive for cough and shortness of breath.   Cardiovascular: Negative.   Gastrointestinal: Negative.   Genitourinary: Negative.   Neurological: Negative.   Psychiatric/Behavioral: Negative.   All other systems reviewed and are negative.      Objective:   Physical Exam  Constitutional: She is oriented to person, place, and time. She appears well-developed and well-nourished.  HENT:  Right Ear: Hearing, tympanic membrane, external ear and ear canal normal.  Left Ear: Hearing, tympanic membrane, external ear and ear canal normal.  Nose: Mucosal edema and rhinorrhea present. Right sinus exhibits no maxillary sinus tenderness and no frontal sinus tenderness. Left sinus exhibits no maxillary sinus tenderness and no frontal sinus tenderness.  Mouth/Throat: Uvula is midline, oropharynx is clear and moist and mucous membranes are normal.  Neck: Normal range of motion. Neck supple.  Cardiovascular: Normal rate and regular rhythm.  Pulmonary/Chest: Effort normal. She has wheezes (throughout). Rales: throughout.  Abdominal: Soft. Bowel sounds are normal.  Musculoskeletal: Normal range of motion.  Neurological: She is alert and oriented to person, place, and time.  Skin: Skin is warm and dry.  Psychiatric: She has a normal mood and affect. Her behavior is normal. Judgment and thought content normal.   BP 106/66   Pulse 90   Temp  (!) 96 F (35.6 C) (Oral)   Ht 5\' 4"  (1.626 m)   Wt 143 lb (64.9 kg)   BMI 24.55 kg/m   Chest xray- no pneumonia- no new progressive disease-Preliminary reading by Ronnald Collum, FNP  [facility]       Assessment & Plan:   1. Cough   2. Upper respiratory infection with cough and congestion    Meds ordered this encounter  Medications  . azithromycin (ZITHROMAX Z-PAK) 250 MG tablet    Sig: As directed    Dispense:  6 tablet    Refill:  0    Order Specific Question:   Supervising Provider    Answer:   VINCENT, CAROL L [[id number]]   1. Take meds as prescribed 2. Use a cool mist humidifier especially during the winter months and when heat has been humid. 3. Use saline nose sprays frequently 4. Saline irrigations of the nose can be very helpful if done frequently.  * 4X daily for 1 week*  * Use of a nettie pot can be helpful with this. Follow directions with this* 5. Drink plenty of fluids 6. Keep thermostat turn down low 7.For any cough or congestion  Use plain Mucinex- regular strength or max strength is fine   * Children- consult with Pharmacist for dosing 8. For fever or aces or pains- take tylenol or ibuprofen appropriate for age and weight.  * for fevers greater than 101 orally you may alternate ibuprofen and tylenol every  3 hours.   Mary-Margaret Hassell Done, FNP

## 2017-10-16 NOTE — Patient Instructions (Signed)

## 2017-10-17 ENCOUNTER — Encounter (HOSPITAL_COMMUNITY): Payer: Self-pay | Admitting: Internal Medicine

## 2017-10-20 ENCOUNTER — Other Ambulatory Visit: Payer: Self-pay

## 2017-10-20 ENCOUNTER — Inpatient Hospital Stay (HOSPITAL_COMMUNITY): Payer: Medicare Other

## 2017-10-20 ENCOUNTER — Encounter (HOSPITAL_COMMUNITY): Payer: Self-pay | Admitting: Hematology

## 2017-10-20 ENCOUNTER — Inpatient Hospital Stay (HOSPITAL_COMMUNITY): Payer: Medicare Other | Attending: Hematology | Admitting: Hematology

## 2017-10-20 VITALS — BP 124/59 | HR 73 | Resp 18 | Ht 64.0 in | Wt 140.0 lb

## 2017-10-20 DIAGNOSIS — N189 Chronic kidney disease, unspecified: Secondary | ICD-10-CM

## 2017-10-20 DIAGNOSIS — Z5111 Encounter for antineoplastic chemotherapy: Secondary | ICD-10-CM | POA: Insufficient documentation

## 2017-10-20 DIAGNOSIS — C3491 Malignant neoplasm of unspecified part of right bronchus or lung: Secondary | ICD-10-CM | POA: Diagnosis not present

## 2017-10-20 DIAGNOSIS — R634 Abnormal weight loss: Secondary | ICD-10-CM | POA: Insufficient documentation

## 2017-10-20 DIAGNOSIS — D469 Myelodysplastic syndrome, unspecified: Secondary | ICD-10-CM

## 2017-10-20 DIAGNOSIS — D46Z Other myelodysplastic syndromes: Secondary | ICD-10-CM | POA: Insufficient documentation

## 2017-10-20 DIAGNOSIS — E538 Deficiency of other specified B group vitamins: Secondary | ICD-10-CM

## 2017-10-20 DIAGNOSIS — D631 Anemia in chronic kidney disease: Secondary | ICD-10-CM

## 2017-10-20 LAB — CBC WITH DIFFERENTIAL/PLATELET
Basophils Absolute: 0 10*3/uL (ref 0.0–0.1)
Basophils Relative: 1 %
Eosinophils Absolute: 0.1 10*3/uL (ref 0.0–0.7)
Eosinophils Relative: 3 %
HEMATOCRIT: 28.6 % — AB (ref 36.0–46.0)
Hemoglobin: 9.2 g/dL — ABNORMAL LOW (ref 12.0–15.0)
LYMPHS PCT: 30 %
Lymphs Abs: 0.8 10*3/uL (ref 0.7–4.0)
MCH: 28.5 pg (ref 26.0–34.0)
MCHC: 32.2 g/dL (ref 30.0–36.0)
MCV: 88.5 fL (ref 78.0–100.0)
MONO ABS: 0.2 10*3/uL (ref 0.1–1.0)
MONOS PCT: 9 %
NEUTROS ABS: 1.6 10*3/uL — AB (ref 1.7–7.7)
Neutrophils Relative %: 57 %
Platelets: 73 10*3/uL — ABNORMAL LOW (ref 150–400)
RBC: 3.23 MIL/uL — ABNORMAL LOW (ref 3.87–5.11)
RDW: 19 % — ABNORMAL HIGH (ref 11.5–15.5)
WBC: 2.7 10*3/uL — ABNORMAL LOW (ref 4.0–10.5)

## 2017-10-20 LAB — TSH: TSH: 2.241 u[IU]/mL (ref 0.350–4.500)

## 2017-10-20 LAB — CHROMOSOME ANALYSIS, BONE MARROW

## 2017-10-20 MED ORDER — DARBEPOETIN ALFA 200 MCG/0.4ML IJ SOSY
PREFILLED_SYRINGE | INTRAMUSCULAR | Status: AC
  Filled 2017-10-20: qty 0.4

## 2017-10-20 MED ORDER — DARBEPOETIN ALFA 200 MCG/0.4ML IJ SOSY
200.0000 ug | PREFILLED_SYRINGE | Freq: Once | INTRAMUSCULAR | Status: AC
  Administered 2017-10-20: 200 ug via SUBCUTANEOUS

## 2017-10-20 NOTE — Progress Notes (Signed)
Patient Care Team: Chevis Pretty, FNP as PCP - General (Nurse Practitioner) Danie Binder, MD as Consulting Physician (Gastroenterology)  DIAGNOSIS:  Encounter Diagnoses  Name Primary?  . Low vitamin B12 level Yes  . Anemia in chronic kidney disease, unspecified CKD stage   . Myelodysplastic syndrome ([place])   . MDS (myelodysplastic syndrome), high grade (HCC)     SUMMARY OF ONCOLOGIC HISTORY:   Small cell carcinoma of right lung ([place])   11/28/2015 Imaging    Large anterior mediastinal mass in continuity with a R hilar mass, narrowing of SVC, abnormal densities in RUL, 9 mm spiculated nodule, nonspec. hypodensity in liver      12/05/2015 PET scan    Large hypermetabolic paratracheal mass c/w SCLC, perihilar nodular densities in RML, mild metabolic activity RUL nodule, no distant metastatic disease      12/11/2015 Procedure    Video bronch with biopsies and brushings, endobronchial ultrasound with mediastinal LN aspiration. Dr. Roxan Hockey      12/11/2015 Pathology Results    Trachea biopsy negative, FNA RUL malignant cells c/w SCLC      12/13/2015 - 03/15/2016 Chemotherapy    Cisplatin/Etoposide x 5 cycles.  Cycle #6 cancelled due to significant cytopenias      12/25/2015 Imaging    MRI brain- No intracranial parenchymal enhancing lesion. Tiny right frontal calvarial enhancing lesion. Small metastatic lesion not excluded although this may represent an incidentally detected benign process.      12/29/2015 - 02/13/2016 Radiation Therapy    XRT      02/14/2016 Treatment Plan Change    Chemotherapy deferred x 1 week      02/21/2016 Treatment Plan Change    Carboplatin dose reduced by 20% and Etoposide dose reduced by 10%.      04/24/2016 Imaging    CT CAP- Chest Impression:  1. Marked reduction in mediastinal and RIGHT hilar lymphadenopathy with no residual pathologically enlarged nodes. 2. Resolution of perihilar nodularity in the RIGHT lung which was hypermetabolic  on FDG PET. 3. Persistent nodule in the RIGHT upper lobe which is not hypermetabolic. 4. New ground-glass nodule in the RIGHT upper lobe is nonspecific and may relate to therapy.  Abdomen / Pelvis Impression:  1. No evidence of metastatic disease in the abdomen pelvis. 2. Atherosclerotic calcification of the aorta.      04/24/2016 Imaging    Bone scan- Nonspecific increased tracer localization at approximately T11 vertebral body; this is of uncertain etiology, could represent a degenerative process though metastatic disease is not excluded.      04/29/2016 -  Radiation Therapy    PCI -- 25 Gy      07/24/2016 Imaging    CT CAP- 1. There has been interval decrease in size of mediastinal lymph nodes. The index lesion within the right upper lobe is also decreased in size from previous exam. 2. New subpleural nodule is noted within the posteromedial right lower lobe. Attention on follow-up imaging advised. 3. Stable ground-glass attenuating nodule within the right lower lobe and anteromedial left upper lobe 4. Aortic atherosclerosis and coronary artery calcification      07/29/2016 Imaging    MRI t-spine: 1. Stable benign slightly enhancing sclerotic lesion in the right posterolateral aspect of the T11 vertebral body, unchanged for 5 years. This is felt to account for the subtle increased activity at T11 on the SPECT bone scan. 2. Slight degenerative disc disease with small disc protrusions at T1-2 and T2-3 and T12-L1 with no neural impingement. 3.  No evidence of metastatic disease.      09/23/2016 Imaging    CT chest- Radiation changes in the central right upper lobe/ perihilar region.  Stable small mediastinal lymph nodes, unchanged.  No findings suspicious for new/progressive metastatic disease.      12/25/2016 Imaging    CT C/A/P: IMPRESSION: 1. Continued further decrease in mediastinal lymph nodes. 2. Index nodule antero medial right upper lobe has decreased. The new 5  mm right lower lobe subpleural nodule seen on the previous study has resolved in the interval. Ground-glass nodules in the upper lobes bilaterally are stable. 3. Interval development of interstitial and alveolar opacity in the parahilar right lung is presumably radiation related. Attention on follow-up recommended. 4. New tiny right pleural effusion with areas of apparent pleural enhancement in the posterior right costophrenic sulcus. Close attention on follow-up recommended. 5. Marked abdominal aortic atherosclerosis with likely infrarenal significant stenosis.       07/02/2017 Imaging    CT Chest w/ contrast: Resolution of central right lower lobe infiltrate and right pleural effusion since prior study.  Stable ground-glass opacities in both upper lobes.  No new or progressive disease       CHIEF COMPLIANT: Follow-up of normocytic anemia.  INTERVAL HISTORY: Janet Fletcher is accompanied by all of her 4 daughters.  She received 2 units of blood transfusion 1 week ago.  She felt better with improvement in energy after transfusion.  She has occasional lightheadedness but denies any chest pains.  She lost about 5-6 pounds.  She reports drinking about 3 cans of boost per day.  She is also eating 3 meals per day.  Denies any easy bruising or bleeding.  REVIEW OF SYSTEMS:   Constitutional: Denies fevers, chills.  She has positive weight loss.  Eyes: Denies blurriness of vision Ears, nose, mouth, throat, and face: Denies mucositis or sore throat Respiratory: Denies cough, dyspnea or wheezes Cardiovascular: Denies palpitation, chest discomfort Gastrointestinal:  Denies nausea, heartburn or change in bowel habits Skin: Denies abnormal skin rashes Lymphatics: Denies new lymphadenopathy or easy bruising Neurological:Denies numbness, tingling or new weaknesses Behavioral/Psych: Mood is stable, no new changes  Extremities: No lower extremity edema All other systems were reviewed with  the patient and are negative.  I have reviewed the past medical history, past surgical history, social history and family history with the patient and they are unchanged from previous note.  ALLERGIES:  is allergic to flagyl [metronidazole hcl] and penicillins.  MEDICATIONS:  Current Outpatient Medications  Medication Sig Dispense Refill  . azithromycin (ZITHROMAX Z-PAK) 250 MG tablet As directed 6 tablet 0  . cyanocobalamin (,VITAMIN B-12,) 1000 MCG/ML injection Inject 1 mL (1,000 mcg total) into the muscle every 30 (thirty) days. 3 mL 1  . donepezil (ARICEPT) 10 MG tablet Take 1 tablet (10 mg total) by mouth at bedtime. 90 tablet 1   No current facility-administered medications for this visit.     PHYSICAL EXAMINATION: ECOG PERFORMANCE STATUS: 1 - Symptomatic but completely ambulatory  Vitals:   10/20/17 1426  BP: (!) 124/59  Pulse: 73  Resp: 18  SpO2: 97%   Filed Weights   10/20/17 1426  Weight: 140 lb (63.5 kg)    GENERAL:alert, no distress and comfortable SKIN: skin color, texture, turgor are normal, no rashes or significant lesions   LABORATORY DATA:  I have reviewed the data as listed CMP Latest Ref Rng & Units 10/03/2017 09/29/2017 09/01/2017  Glucose 65 - 99 mg/dL 95 98 93  BUN  6 - 20 mg/dL 29(H) 26(H) 19  Creatinine 0.44 - 1.00 mg/dL 1.26(H) 1.21(H) 1.11(H)  Sodium 135 - 145 mmol/L 141 140 140  Potassium 3.5 - 5.1 mmol/L 4.0 4.0 4.0  Chloride 101 - 111 mmol/L 104 105 105  CO2 22 - 32 mmol/L 28 28 27   Calcium 8.9 - 10.3 mg/dL 8.8(L) 8.9 9.0  Total Protein 6.5 - 8.1 g/dL - 6.5 6.7  Total Bilirubin 0.3 - 1.2 mg/dL - 0.8 0.8  Alkaline Phos 38 - 126 U/L - 68 76  AST 15 - 41 U/L - 22 22  ALT 14 - 54 U/L - 13(L) 11(L)   No results found for: CXK481   Lab Results  Component Value Date   WBC 2.7 (L) 10/20/2017   HGB 9.2 (L) 10/20/2017   HCT 28.6 (L) 10/20/2017   MCV 88.5 10/20/2017   PLT 73 (L) 10/20/2017   NEUTROABS 1.6 (L) 10/20/2017    ASSESSMENT &  PLAN:  MDS (myelodysplastic syndrome), high grade (HCC) 1.  MDS, high risk by IPSS-R: Bone marrow aspiration and biopsy done on 10/03/2017 shows erythroid hyperplasia, increased in erythroid precursors with atypia (less than 10%) in the form of nuclear irregularities, nuclear budding and multi nucleation.  Megakaryocytes are increased with frequent small, hyperlobulated and hyperchromatic forms.  Granulocytic precursors have no significant dysplasia. -Chromosome analysis shows 39, XX, del(3)(q21q29),add(7)(p15),del(13)(q12q14)[6]/46,XX[14] -FISH results are pending. -She received a total of 5 points based on revised IPSS system, with a median survival in the absence of therapy of 1.6 years, 25% AML progression in the absence of therapy and 1.4 years.  She has received chemotherapy with carboplatin and etoposide in 2017.  Therapy related MDS should be considered. -She has been receiving Aranesp, her dose was increased to 200 mcg every 2 weeks on 07/07/2018.  Her last Aranesp was on 09/29/2017.  She had a drop in hemoglobin on that day from 8.5-6.5 in 2 weeks.  She received 2 units of blood transfusion on 10/13/2017.  Her serum erythropoietin was 25.8 in December 2018.  Once we get the official results, we will talk about hypoventilating agents.  Today she will receive Aranesp.  We will also check her CBC.  2.  Weight loss: She lost about 7 pounds in the last 2-3 weeks.  She reports that she is drinking about 2-3 cans of Ensure per day and eating 3 meals per day.  We will keep a close eye on her weight.  3.  Small cell lung cancer: She has completed chemoradiation therapy around July 2017.  Her last CT scan was in December 2018.  We will plan to repeat CT scan soon.   I spent 25 minutes talking to the patient of which more than half was spent in counseling and coordination of care.  Orders Placed This Encounter  Procedures  . TSH    Standing Status:   Future    Number of Occurrences:   1    Standing  Expiration Date:   10/20/2018  . [facility]ICATION [facility]    Lab appointment 10 min.  [facility] SCHEDULING COMMUNICATION INJECTION    Schedule injection appointment 15 min  . ARANESP TREATMENT CONDITION    Hold Aranesp:  Renal hold for Hemoglobin greater than 11   The patient has a good understanding of the overall plan. she agrees with it. she will call with any problems that may develop before the next visit here.   Derek Jack, MD 10/20/17

## 2017-10-20 NOTE — Assessment & Plan Note (Signed)
1.  MDS, high risk by IPSS-R: Bone marrow aspiration and biopsy done on 10/03/2017 shows erythroid hyperplasia, increased in erythroid precursors with atypia (less than 10%) in the form of nuclear irregularities, nuclear budding and multi nucleation.  Megakaryocytes are increased with frequent small, hyperlobulated and hyperchromatic forms.  Granulocytic precursors have no significant dysplasia. -Chromosome analysis shows 84, XX, del(3)(q21q29),add(7)(p15),del(13)(q12q14)[6]/46,XX[14] -FISH results are pending. -She received a total of 5 points based on revised IPSS system, with a median survival in the absence of therapy of 1.6 years, 25% AML progression in the absence of therapy and 1.4 years.  She has received chemotherapy with carboplatin and etoposide in 2017.  Therapy related MDS should be considered. -She has been receiving Aranesp, her dose was increased to 200 mcg every 2 weeks on 07/07/2018.  Her last Aranesp was on 09/29/2017.  She had a drop in hemoglobin on that day from 8.5-6.5 in 2 weeks.  She received 2 units of blood transfusion on 10/13/2017.  Her serum erythropoietin was 25.8 in December 2018.  Once we get the official results, we will talk about hypoventilating agents.  Today she will receive Aranesp.  We will also check her CBC.  2.  Weight loss: She lost about 7 pounds in the last 2-3 weeks.  She reports that she is drinking about 2-3 cans of Ensure per day and eating 3 meals per day.  We will keep a close eye on her weight.  3.  Small cell lung cancer: She has completed chemoradiation therapy around July 2017.  Her last CT scan was in December 2018.  We will plan to repeat CT scan soon.

## 2017-10-20 NOTE — Progress Notes (Signed)
I spoke with Estill Bamberg at [facility] - Dr. Delton Coombes requested MDS FISH Panel on bone marrow biopsy. Estill Bamberg said they could do it. Dr. Raliegh Ip notified.

## 2017-10-21 NOTE — Progress Notes (Signed)
Janet Fletcher presents today for injection per the provider's orders.  Aranesp administration without incident; see MAR for injection details.  Patient tolerated procedure well and without incident.  No questions or complaints noted at this time.  Discharged ambulatory.

## 2017-10-27 ENCOUNTER — Inpatient Hospital Stay (HOSPITAL_COMMUNITY): Payer: Medicare Other

## 2017-10-27 ENCOUNTER — Other Ambulatory Visit: Payer: Self-pay

## 2017-10-27 ENCOUNTER — Inpatient Hospital Stay (HOSPITAL_BASED_OUTPATIENT_CLINIC_OR_DEPARTMENT_OTHER): Payer: Medicare Other | Admitting: Hematology

## 2017-10-27 ENCOUNTER — Encounter (HOSPITAL_COMMUNITY): Payer: Self-pay | Admitting: Hematology

## 2017-10-27 ENCOUNTER — Other Ambulatory Visit (HOSPITAL_COMMUNITY): Payer: Medicare Other

## 2017-10-27 VITALS — BP 119/98 | HR 86 | Temp 98.7°F | Resp 16 | Ht 64.0 in | Wt 140.0 lb

## 2017-10-27 DIAGNOSIS — Z5111 Encounter for antineoplastic chemotherapy: Secondary | ICD-10-CM | POA: Diagnosis not present

## 2017-10-27 DIAGNOSIS — C3491 Malignant neoplasm of unspecified part of right bronchus or lung: Secondary | ICD-10-CM | POA: Diagnosis not present

## 2017-10-27 DIAGNOSIS — R634 Abnormal weight loss: Secondary | ICD-10-CM | POA: Diagnosis not present

## 2017-10-27 DIAGNOSIS — D469 Myelodysplastic syndrome, unspecified: Secondary | ICD-10-CM

## 2017-10-27 DIAGNOSIS — D46Z Other myelodysplastic syndromes: Secondary | ICD-10-CM

## 2017-10-27 LAB — CBC WITH DIFFERENTIAL/PLATELET
BASOS ABS: 0 10*3/uL (ref 0.0–0.1)
Basophils Relative: 1 %
EOS PCT: 2 %
Eosinophils Absolute: 0.1 10*3/uL (ref 0.0–0.7)
HCT: 25.1 % — ABNORMAL LOW (ref 36.0–46.0)
Hemoglobin: 7.8 g/dL — ABNORMAL LOW (ref 12.0–15.0)
LYMPHS PCT: 13 %
Lymphs Abs: 0.5 10*3/uL — ABNORMAL LOW (ref 0.7–4.0)
MCH: 28.2 pg (ref 26.0–34.0)
MCHC: 31.1 g/dL (ref 30.0–36.0)
MCV: 90.6 fL (ref 78.0–100.0)
MONO ABS: 0.4 10*3/uL (ref 0.1–1.0)
Monocytes Relative: 10 %
NEUTROS ABS: 3 10*3/uL (ref 1.7–7.7)
Neutrophils Relative %: 76 %
PLATELETS: 68 10*3/uL — AB (ref 150–400)
RBC: 2.77 MIL/uL — ABNORMAL LOW (ref 3.87–5.11)
RDW: 18.1 % — AB (ref 11.5–15.5)
WBC: 4 10*3/uL (ref 4.0–10.5)

## 2017-10-27 LAB — COMPREHENSIVE METABOLIC PANEL
ALBUMIN: 3.3 g/dL — AB (ref 3.5–5.0)
ALT: 13 U/L — AB (ref 14–54)
AST: 23 U/L (ref 15–41)
Alkaline Phosphatase: 63 U/L (ref 38–126)
Anion gap: 8 (ref 5–15)
BUN: 24 mg/dL — AB (ref 6–20)
CHLORIDE: 102 mmol/L (ref 101–111)
CO2: 27 mmol/L (ref 22–32)
CREATININE: 1.26 mg/dL — AB (ref 0.44–1.00)
Calcium: 8.6 mg/dL — ABNORMAL LOW (ref 8.9–10.3)
GFR calc Af Amer: 46 mL/min — ABNORMAL LOW (ref >60)
GFR calc non Af Amer: 39 mL/min — ABNORMAL LOW (ref >60)
GLUCOSE: 91 mg/dL (ref 65–99)
POTASSIUM: 3.8 mmol/L (ref 3.5–5.1)
Sodium: 137 mmol/L (ref 135–145)
Total Bilirubin: 1.1 mg/dL (ref 0.3–1.2)
Total Protein: 6.4 g/dL — ABNORMAL LOW (ref 6.5–8.1)

## 2017-10-27 LAB — PREPARE RBC (CROSSMATCH)

## 2017-10-27 LAB — LACTATE DEHYDROGENASE: LDH: 170 U/L (ref 98–192)

## 2017-10-27 NOTE — Progress Notes (Signed)
[facility] [place], [place] [id number]   CLINIC:  Medical Oncology/Hematology  PCP:  Chevis Pretty, [address] [place] [address] [phone]   REASON FOR VISIT:  Follow-up for MDS.  CURRENT THERAPY: None.  BRIEF ONCOLOGIC HISTORY:    Small cell carcinoma of right lung ([place])   11/28/2015 Imaging    Large anterior mediastinal mass in continuity with a R hilar mass, narrowing of SVC, abnormal densities in RUL, 9 mm spiculated nodule, nonspec. hypodensity in liver      12/05/2015 PET scan    Large hypermetabolic paratracheal mass c/w SCLC, perihilar nodular densities in RML, mild metabolic activity RUL nodule, no distant metastatic disease      12/11/2015 Procedure    Video bronch with biopsies and brushings, endobronchial ultrasound with mediastinal LN aspiration. Dr. Roxan Hockey      12/11/2015 Pathology Results    Trachea biopsy negative, FNA RUL malignant cells c/w SCLC      12/13/2015 - 03/15/2016 Chemotherapy    Cisplatin/Etoposide x 5 cycles.  Cycle #6 cancelled due to significant cytopenias      12/25/2015 Imaging    MRI brain- No intracranial parenchymal enhancing lesion. Tiny right frontal calvarial enhancing lesion. Small metastatic lesion not excluded although this may represent an incidentally detected benign process.      12/29/2015 - 02/13/2016 Radiation Therapy    XRT      02/14/2016 Treatment Plan Change    Chemotherapy deferred x 1 week      02/21/2016 Treatment Plan Change    Carboplatin dose reduced by 20% and Etoposide dose reduced by 10%.      04/24/2016 Imaging    CT CAP- Chest Impression:  1. Marked reduction in mediastinal and RIGHT hilar lymphadenopathy with no residual pathologically enlarged nodes. 2. Resolution of perihilar nodularity in the RIGHT lung which was hypermetabolic on FDG PET. 3. Persistent nodule in the RIGHT upper lobe which is not hypermetabolic. 4. New ground-glass nodule  in the RIGHT upper lobe is nonspecific and may relate to therapy.  Abdomen / Pelvis Impression:  1. No evidence of metastatic disease in the abdomen pelvis. 2. Atherosclerotic calcification of the aorta.      04/24/2016 Imaging    Bone scan- Nonspecific increased tracer localization at approximately T11 vertebral body; this is of uncertain etiology, could represent a degenerative process though metastatic disease is not excluded.      04/29/2016 -  Radiation Therapy    PCI -- 25 Gy      07/24/2016 Imaging    CT CAP- 1. There has been interval decrease in size of mediastinal lymph nodes. The index lesion within the right upper lobe is also decreased in size from previous exam. 2. New subpleural nodule is noted within the posteromedial right lower lobe. Attention on follow-up imaging advised. 3. Stable ground-glass attenuating nodule within the right lower lobe and anteromedial left upper lobe 4. Aortic atherosclerosis and coronary artery calcification      07/29/2016 Imaging    MRI t-spine: 1. Stable benign slightly enhancing sclerotic lesion in the right posterolateral aspect of the T11 vertebral body, unchanged for 5 years. This is felt to account for the subtle increased activity at T11 on the SPECT bone scan. 2. Slight degenerative disc disease with small disc protrusions at T1-2 and T2-3 and T12-L1 with no neural impingement. 3. No evidence of metastatic disease.      09/23/2016 Imaging    CT chest- Radiation changes  in the central right upper lobe/ perihilar region.  Stable small mediastinal lymph nodes, unchanged.  No findings suspicious for new/progressive metastatic disease.      12/25/2016 Imaging    CT C/A/P: IMPRESSION: 1. Continued further decrease in mediastinal lymph nodes. 2. Index nodule antero medial right upper lobe has decreased. The new 5 mm right lower lobe subpleural nodule seen on the previous study has resolved in the interval. Ground-glass nodules  in the upper lobes bilaterally are stable. 3. Interval development of interstitial and alveolar opacity in the parahilar right lung is presumably radiation related. Attention on follow-up recommended. 4. New tiny right pleural effusion with areas of apparent pleural enhancement in the posterior right costophrenic sulcus. Close attention on follow-up recommended. 5. Marked abdominal aortic atherosclerosis with likely infrarenal significant stenosis.       07/02/2017 Imaging    CT Chest w/ contrast: Resolution of central right lower lobe infiltrate and right pleural effusion since prior study.  Stable ground-glass opacities in both upper lobes.  No new or progressive disease        CANCER STAGING: Cancer Staging No matching staging information was found for the patient.   INTERVAL HISTORY:  [name] 82 y.o. female returns for routine follow-up of bone marrow biopsy results.  She reports slight tiredness.  She also has some congestion from allergies.  She has occasional dizziness.  She denies any bleeding per rectum or melena.  She denies any fevers or infections.  She denies any hospitalizations in the last 1 week.   REVIEW OF SYSTEMS:  Review of Systems  Constitutional: Positive for fatigue.  Neurological: Positive for dizziness.  All other systems reviewed and are negative.    PAST MEDICAL/SURGICAL HISTORY:  Past Medical History:  Diagnosis Date  . Allergy   . Cancer ([place])    lung  . Hyperlipidemia   . Iron deficiency anemia 12/15/2015  . Low vitamin B12 level 12/15/2015  . Osteopenia    Past Surgical History:  Procedure Laterality Date  . CATARACT EXTRACTION, BILATERAL Bilateral   . COLONOSCOPY N/A 12/06/2016   Procedure: COLONOSCOPY;  Surgeon: Danie Binder, MD;  [facility];  Service: Endoscopy;  Laterality: N/A;  1:45pm  . ESOPHAGOGASTRODUODENOSCOPY N/A 12/06/2016   Procedure: ESOPHAGOGASTRODUODENOSCOPY (EGD);  Surgeon: Danie Binder,  MD;  Location: [facility];  Service: Endoscopy;  Laterality: N/A;  . MINOR EXCISION EAR CANAL CYST Left    30+ years ago  . SMALL INTESTINE SURGERY    . VIDEO BRONCHOSCOPY WITH ENDOBRONCHIAL ULTRASOUND N/A 12/11/2015   Procedure: VIDEO BRONCHOSCOPY WITH ENDOBRONCHIAL ULTRASOUND;  Surgeon: Melrose Nakayama, MD;  Location: [facility];  Service: Thoracic;  Laterality: N/A;     SOCIAL HISTORY:  Social History   Socioeconomic History  . Marital status: Married    Spouse name: Not on file  . Number of children: Not on file  . Years of education: Not on file  . Highest education level: Not on file  Occupational History  . Not on file  Social Needs  . Financial resource strain: Not on file  . Food insecurity:    Worry: Not on file    Inability: Not on file  . Transportation needs:    Medical: Not on file    Non-medical: Not on file  Tobacco Use  . Smoking status: Former Smoker    Packs/day: 0.25    Years: 50.00    Pack years: 12.50    Last attempt to quit: 01/08/2016  Years since quitting: 1.8  . Smokeless tobacco: Never Used  Substance and Sexual Activity  . Alcohol use: No  . Drug use: No  . Sexual activity: Not Currently  Lifestyle  . Physical activity:    Days per week: Not on file    Minutes per session: Not on file  . Stress: Not on file  Relationships  . Social connections:    Talks on phone: Not on file    Gets together: Not on file    Attends religious service: Not on file    Active member of club or organization: Not on file    Attends meetings of clubs or organizations: Not on file    Relationship status: Not on file  . Intimate partner violence:    Fear of current or ex partner: Not on file    Emotionally abused: Not on file    Physically abused: Not on file    Forced sexual activity: Not on file  Other Topics Concern  . Not on file  Social History Narrative  . Not on file    FAMILY HISTORY:  Family History  Problem Relation Age of Onset  .  Cancer Mother   . Hypertension Father   . Colon cancer Neg Hx     CURRENT MEDICATIONS:  Outpatient Encounter Medications as of 10/27/2017  Medication Sig  . azithromycin (ZITHROMAX Z-PAK) 250 MG tablet As directed  . cyanocobalamin (,VITAMIN B-12,) 1000 MCG/ML injection Inject 1 mL (1,000 mcg total) into the muscle every 30 (thirty) days.  Marland Kitchen donepezil (ARICEPT) 10 MG tablet Take 1 tablet (10 mg total) by mouth at bedtime.   No facility-administered encounter medications on file as of 10/27/2017.     ALLERGIES:  Allergies  Allergen Reactions  . Flagyl [Metronidazole Hcl] Nausea Only  . Penicillins Other (See Comments)    Has patient had a PCN reaction causing immediate rash, facial/tongue/throat swelling, SOB or lightheadedness with hypotension: unknown Has patient had a PCN reaction causing severe rash involving mucus membranes or skin necrosis: unknown Has patient had a PCN reaction that required hospitalization unknown Has patient had a PCN reaction occurring within the last 10 years: unknown  If all of the above answers are "NO", then may proceed with Cephalosporin use.     PHYSICAL EXAM:  ECOG Performance status: 1  Vitals:   10/27/17 1357  BP: (!) 119/98  Pulse: 86  Resp: 16  Temp: 98.7 F (37.1 C)  SpO2: 100%   Filed Weights   10/27/17 1357  Weight: 140 lb (63.5 kg)      LABORATORY DATA:  I have reviewed the labs as listed.  CBC    Component Value Date/Time   WBC 4.0 10/27/2017 1254   RBC 2.77 (L) 10/27/2017 1254   HGB 7.8 (L) 10/27/2017 1254   HCT 25.1 (L) 10/27/2017 1254   PLT 68 (L) 10/27/2017 1254   MCV 90.6 10/27/2017 1254   MCV 71.9 (A) 08/20/2013 1559   MCH 28.2 10/27/2017 1254   MCHC 31.1 10/27/2017 1254   RDW 18.1 (H) 10/27/2017 1254   LYMPHSABS 0.5 (L) 10/27/2017 1254   MONOABS 0.4 10/27/2017 1254   EOSABS 0.1 10/27/2017 1254   BASOSABS 0.0 10/27/2017 1254   CMP Latest Ref Rng & Units 10/27/2017 10/03/2017 09/29/2017  Glucose 65 - 99 mg/dL  91 95 98  BUN 6 - 20 mg/dL 24(H) 29(H) 26(H)  Creatinine 0.44 - 1.00 mg/dL 1.26(H) 1.26(H) 1.21(H)  Sodium 135 - 145 mmol/L 137 141 140  Potassium 3.5 - 5.1 mmol/L 3.8 4.0 4.0  Chloride 101 - 111 mmol/L 102 104 105  CO2 22 - 32 mmol/L '27 28 28  ' Calcium 8.9 - 10.3 mg/dL 8.6(L) 8.8(L) 8.9  Total Protein 6.5 - 8.1 g/dL 6.4(L) - 6.5  Total Bilirubin 0.3 - 1.2 mg/dL 1.1 - 0.8  Alkaline Phos 38 - 126 U/L 63 - 68  AST 15 - 41 U/L 23 - 22  ALT 14 - 54 U/L 13(L) - 13(L)       ASSESSMENT & PLAN:   MDS (myelodysplastic syndrome), high grade (HCC) 1.  MDS, high risk by IPSS-R: Bone marrow aspiration and biopsy done on 10/03/2017 shows erythroid hyperplasia, increased in erythroid precursors with atypia (less than 10%) in the form of nuclear irregularities, nuclear budding and multi nucleation.  Megakaryocytes are increased with frequent small, hyperlobulated and hyperchromatic forms.  Granulocytic precursors have no significant dysplasia. -Chromosome analysis shows 85, XX, del(3)(q21q29),add(7)(p15),del(13)(q12q14)[6]/46,XX[14] -FISH results show 5 q-, loss of chromosome 7, and trisomy 8. -She received a total of 5 points based on revised IPSS system, with a median survival in the absence of therapy of 1.6 years, 25% AML progression in the absence of therapy and 1.4 years.  She has received chemotherapy with carboplatin and etoposide in 2017.  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  She has been receiving Aranesp for more than a year, dose increased to 200 mcg every 2 weeks in December 2019.  We have given Aranesp at last visit a week ago.  She completely lost response to erythropoiesis stimulating agents. -We talked about initiating her on hypo-methylating agents like azacitidine/decitabine with the response rates of 40-50%.  I have also called and talked to Dr. Al Decant at [facility] to see if there are any clinical trials available.  Unfortunately there are none available at this  time.  Hence will proceed with azacitidine on a 5-day regimen.  Today her hemoglobin is down to 7.2.  We will arrange for 1 unit of blood transfusion.  Likely will start [place] tomorrow.  2.  Weight loss: She lost about 7 pounds in the last 2-3 weeks.  She reports that she is drinking about 2-3 cans of Ensure per day and eating 3 meals per day.  We will keep a close eye on her weight.  3.  Small cell lung cancer: She has completed chemoradiation therapy around July 2017.  Her last CT scan was in December 2018.  We will plan to repeat CT scan soon.      Orders placed this encounter:  Orders Placed This Encounter  Procedures  . Complete patient signature process for consent form  . Care order/instruction  . Type and screen  . Prepare RBC      Derek Jack, MD [place] [phone]

## 2017-10-27 NOTE — Assessment & Plan Note (Signed)
1.  MDS, high risk by IPSS-R: Bone marrow aspiration and biopsy done on 10/03/2017 shows erythroid hyperplasia, increased in erythroid precursors with atypia (less than 10%) in the form of nuclear irregularities, nuclear budding and multi nucleation.  Megakaryocytes are increased with frequent small, hyperlobulated and hyperchromatic forms.  Granulocytic precursors have no significant dysplasia. -Chromosome analysis shows 34, XX, del(3)(q21q29),add(7)(p15),del(13)(q12q14)[6]/46,XX[14] -FISH results show 5 q-, loss of chromosome 7, and trisomy 8. -She received a total of 5 points based on revised IPSS system, with a median survival in the absence of therapy of 1.6 years, 25% AML progression in the absence of therapy and 1.4 years.  She has received chemotherapy with carboplatin and etoposide in 2017.  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  She has been receiving Aranesp for more than a year, dose increased to 200 mcg every 2 weeks in December 2019.  We have given Aranesp at last visit a week ago.  She completely lost response to erythropoiesis stimulating agents. -We talked about initiating her on hypo-methylating agents like azacitidine/decitabine with the response rates of 40-50%.  I have also called and talked to Dr. Al Decant at [facility] to see if there are any clinical trials available.  Unfortunately there are none available at this time.  Hence will proceed with azacitidine on a 5-day regimen.  Today her hemoglobin is down to 7.2.  We will arrange for 1 unit of blood transfusion.  Likely will start [place] tomorrow.  2.  Weight loss: She lost about 7 pounds in the last 2-3 weeks.  She reports that she is drinking about 2-3 cans of Ensure per day and eating 3 meals per day.  We will keep a close eye on her weight.  3.  Small cell lung cancer: She has completed chemoradiation therapy around July 2017.  Her last CT scan was in December 2018.  We will plan to repeat CT scan soon.

## 2017-10-28 ENCOUNTER — Ambulatory Visit (HOSPITAL_COMMUNITY): Payer: Medicare Other

## 2017-10-28 ENCOUNTER — Inpatient Hospital Stay (HOSPITAL_COMMUNITY): Payer: Medicare Other

## 2017-10-28 ENCOUNTER — Other Ambulatory Visit (HOSPITAL_COMMUNITY): Payer: Medicare Other

## 2017-10-28 DIAGNOSIS — D469 Myelodysplastic syndrome, unspecified: Secondary | ICD-10-CM

## 2017-10-28 DIAGNOSIS — Z5111 Encounter for antineoplastic chemotherapy: Secondary | ICD-10-CM | POA: Diagnosis not present

## 2017-10-28 LAB — TISSUE HYBRIDIZATION (BONE MARROW)-NCBH

## 2017-10-28 MED ORDER — SODIUM CHLORIDE 0.9% FLUSH
10.0000 mL | INTRAVENOUS | Status: AC | PRN
  Administered 2017-10-28: 10 mL

## 2017-10-28 MED ORDER — ACETAMINOPHEN 325 MG PO TABS
ORAL_TABLET | ORAL | Status: AC
  Filled 2017-10-28: qty 2

## 2017-10-28 MED ORDER — SODIUM CHLORIDE 0.9 % IV SOLN
250.0000 mL | Freq: Once | INTRAVENOUS | Status: AC
  Administered 2017-10-28: 250 mL via INTRAVENOUS

## 2017-10-28 MED ORDER — DIPHENHYDRAMINE HCL 25 MG PO CAPS
25.0000 mg | ORAL_CAPSULE | Freq: Once | ORAL | Status: AC
  Administered 2017-10-28: 25 mg via ORAL

## 2017-10-28 MED ORDER — ACETAMINOPHEN 325 MG PO TABS
650.0000 mg | ORAL_TABLET | Freq: Once | ORAL | Status: AC
  Administered 2017-10-28: 650 mg via ORAL

## 2017-10-28 MED ORDER — HEPARIN SOD (PORK) LOCK FLUSH 100 UNIT/ML IV SOLN
500.0000 [IU] | Freq: Every day | INTRAVENOUS | Status: AC | PRN
  Administered 2017-10-28: 500 [IU]

## 2017-10-28 MED ORDER — DIPHENHYDRAMINE HCL 25 MG PO CAPS
ORAL_CAPSULE | ORAL | Status: AC
  Filled 2017-10-28: qty 1

## 2017-10-28 NOTE — Patient Instructions (Signed)
Janet Fletcher at [facility] Discharge Instructions  One unit of blood today Follow up as scheduled.   Thank you for choosing [facility] to provide your oncology and hematology care.  To afford each patient quality time with our provider, please arrive at least 15 minutes before your scheduled appointment time.   If you have a lab appointment with the [facility] please come in thru the  Main Entrance and check in at the main information desk  You need to re-schedule your appointment should you arrive 10 or more minutes late.  We strive to give you quality time with our providers, and arriving late affects you and other patients whose appointments are after yours.  Also, if you no show three or more times for appointments you may be dismissed from the clinic at the providers discretion.     Again, thank you for choosing [facility].  Our hope is that these requests will decrease the amount of time that you wait before being seen by our physicians.       _____________________________________________________________  Should you have questions after your visit to [facility], please contact our office at [phone] between the hours of 8:30 a.m. and 4:30 p.m.  Voicemails left after 4:30 p.m. will not be returned until the following business day.  For prescription refill requests, have your pharmacy contact our office.       Resources For Cancer Patients and their Caregivers ? American Cancer Society: Can assist with transportation, wigs, general needs, runs Look Good Feel Better.        ([phone] 736-9085 ? Cancer Care: Provides financial assistance, online support groups, medication/co-pay assistance.  1-800-813-HOPE 989-586-8602) ? [facility] Assists [place] cancer patients and their families through emotional , educational and financial support.  417-094-9350 ? [place]  DSS Where to apply for food stamps, Medicaid and utility assistance. 512-671-9597 ? RCATS: Transportation to medical appointments. 540-032-6584 ? Social Security Administration: May apply for disability if have a Stage IV cancer. 435 860 2330 403-367-8375 ? [employer], Disability and Transit Services: Assists with nutrition, care and transit needs. [facility] Support Programs:   > Cancer Support Group  2nd Tuesday of the month 1pm-2pm, Journey Room   > Creative Journey  3rd Tuesday of the month 1130am-1pm, Journey Room

## 2017-10-29 LAB — TYPE AND SCREEN
ABO/RH(D): A POS
ANTIBODY SCREEN: NEGATIVE
UNIT DIVISION: 0

## 2017-10-29 LAB — BPAM RBC
BLOOD PRODUCT EXPIRATION DATE: 201904262359
ISSUE DATE / TIME: 201904090926
UNIT TYPE AND RH: 600

## 2017-11-03 ENCOUNTER — Other Ambulatory Visit (HOSPITAL_COMMUNITY): Payer: Medicare Other

## 2017-11-03 ENCOUNTER — Ambulatory Visit (HOSPITAL_COMMUNITY): Payer: Medicare Other

## 2017-11-12 ENCOUNTER — Other Ambulatory Visit (HOSPITAL_COMMUNITY): Payer: Self-pay

## 2017-11-12 ENCOUNTER — Telehealth (HOSPITAL_COMMUNITY): Payer: Self-pay

## 2017-11-12 DIAGNOSIS — D46Z Other myelodysplastic syndromes: Secondary | ICD-10-CM

## 2017-11-12 DIAGNOSIS — D500 Iron deficiency anemia secondary to blood loss (chronic): Secondary | ICD-10-CM

## 2017-11-12 NOTE — Telephone Encounter (Signed)
Patients daughter, Asa Lente, called stating they have not heard from [place] concerning Dr. Delton Coombes getting in touch with [facility]. Printed last note and reviewed with Dr. Delton Coombes. He wants to see patient with labs next week. Appointment made for 0800 for labs and 0850 to see Dr. Raliegh Ip on Monday, 11/17/17. Notified patients daughter, Asa Lente with understanding verbalized.

## 2017-11-17 ENCOUNTER — Other Ambulatory Visit: Payer: Self-pay

## 2017-11-17 ENCOUNTER — Encounter (HOSPITAL_COMMUNITY): Payer: Self-pay | Admitting: Hematology

## 2017-11-17 ENCOUNTER — Inpatient Hospital Stay (HOSPITAL_COMMUNITY): Payer: Medicare Other

## 2017-11-17 ENCOUNTER — Inpatient Hospital Stay (HOSPITAL_BASED_OUTPATIENT_CLINIC_OR_DEPARTMENT_OTHER): Payer: Medicare Other | Admitting: Hematology

## 2017-11-17 ENCOUNTER — Other Ambulatory Visit (HOSPITAL_COMMUNITY): Payer: Self-pay | Admitting: Pharmacist

## 2017-11-17 VITALS — BP 105/49 | HR 72 | Temp 97.7°F | Resp 16

## 2017-11-17 VITALS — BP 106/49 | HR 88 | Temp 97.6°F | Resp 18 | Wt 142.7 lb

## 2017-11-17 DIAGNOSIS — D649 Anemia, unspecified: Secondary | ICD-10-CM

## 2017-11-17 DIAGNOSIS — C3491 Malignant neoplasm of unspecified part of right bronchus or lung: Secondary | ICD-10-CM

## 2017-11-17 DIAGNOSIS — D500 Iron deficiency anemia secondary to blood loss (chronic): Secondary | ICD-10-CM

## 2017-11-17 DIAGNOSIS — D46Z Other myelodysplastic syndromes: Secondary | ICD-10-CM

## 2017-11-17 DIAGNOSIS — Z5111 Encounter for antineoplastic chemotherapy: Secondary | ICD-10-CM | POA: Diagnosis not present

## 2017-11-17 LAB — COMPREHENSIVE METABOLIC PANEL
ALT: 12 U/L — AB (ref 14–54)
AST: 22 U/L (ref 15–41)
Albumin: 3.4 g/dL — ABNORMAL LOW (ref 3.5–5.0)
Alkaline Phosphatase: 62 U/L (ref 38–126)
Anion gap: 8 (ref 5–15)
BUN: 33 mg/dL — AB (ref 6–20)
CHLORIDE: 108 mmol/L (ref 101–111)
CO2: 25 mmol/L (ref 22–32)
CREATININE: 1.34 mg/dL — AB (ref 0.44–1.00)
Calcium: 8.9 mg/dL (ref 8.9–10.3)
GFR calc Af Amer: 42 mL/min — ABNORMAL LOW (ref >60)
GFR, EST NON AFRICAN AMERICAN: 37 mL/min — AB (ref >60)
Glucose, Bld: 120 mg/dL — ABNORMAL HIGH (ref 65–99)
Potassium: 4 mmol/L (ref 3.5–5.1)
Sodium: 141 mmol/L (ref 135–145)
Total Bilirubin: 1.1 mg/dL (ref 0.3–1.2)
Total Protein: 6.2 g/dL — ABNORMAL LOW (ref 6.5–8.1)

## 2017-11-17 LAB — CBC WITH DIFFERENTIAL/PLATELET
Basophils Absolute: 0 10*3/uL (ref 0.0–0.1)
Basophils Relative: 1 %
EOS PCT: 3 %
Eosinophils Absolute: 0.1 10*3/uL (ref 0.0–0.7)
HCT: 22.1 % — ABNORMAL LOW (ref 36.0–46.0)
Hemoglobin: 7 g/dL — ABNORMAL LOW (ref 12.0–15.0)
Lymphocytes Relative: 13 %
Lymphs Abs: 0.4 10*3/uL — ABNORMAL LOW (ref 0.7–4.0)
MCH: 28.3 pg (ref 26.0–34.0)
MCHC: 31.7 g/dL (ref 30.0–36.0)
MCV: 89.5 fL (ref 78.0–100.0)
MONO ABS: 0.2 10*3/uL (ref 0.1–1.0)
MONOS PCT: 6 %
NEUTROS PCT: 77 %
Neutro Abs: 2.7 10*3/uL (ref 1.7–7.7)
PLATELETS: 71 10*3/uL — AB (ref 150–400)
RBC: 2.47 MIL/uL — AB (ref 3.87–5.11)
RDW: 16.8 % — ABNORMAL HIGH (ref 11.5–15.5)
WBC: 3.4 10*3/uL — AB (ref 4.0–10.5)

## 2017-11-17 LAB — LACTATE DEHYDROGENASE: LDH: 147 U/L (ref 98–192)

## 2017-11-17 LAB — PREPARE RBC (CROSSMATCH)

## 2017-11-17 MED ORDER — DEXAMETHASONE SODIUM PHOSPHATE 100 MG/10ML IJ SOLN: 10.0000 mg | Freq: Once | INTRAMUSCULAR | Status: DC

## 2017-11-17 MED ORDER — PALONOSETRON HCL INJECTION 0.25 MG/5ML
0.2500 mg | Freq: Once | INTRAVENOUS | Status: AC
  Administered 2017-11-17: 0.25 mg via INTRAVENOUS
  Filled 2017-11-17: qty 5

## 2017-11-17 MED ORDER — HEPARIN SOD (PORK) LOCK FLUSH 100 UNIT/ML IV SOLN
500.0000 [IU] | Freq: Once | INTRAVENOUS | Status: DC | PRN
  Filled 2017-11-17: qty 5

## 2017-11-17 MED ORDER — SODIUM CHLORIDE 0.9 % IV SOLN
75.0000 mg/m2 | Freq: Once | INTRAVENOUS | Status: AC
  Administered 2017-11-17: 130 mg via INTRAVENOUS
  Filled 2017-11-17: qty 13

## 2017-11-17 MED ORDER — DIPHENHYDRAMINE HCL 25 MG PO CAPS
ORAL_CAPSULE | ORAL | Status: AC
  Filled 2017-11-17: qty 1

## 2017-11-17 MED ORDER — SODIUM CHLORIDE 0.9 % IV SOLN
Freq: Once | INTRAVENOUS | Status: AC
  Administered 2017-11-17: 10:00:00 via INTRAVENOUS

## 2017-11-17 MED ORDER — DIPHENHYDRAMINE HCL 25 MG PO CAPS
25.0000 mg | ORAL_CAPSULE | Freq: Once | ORAL | Status: AC
  Administered 2017-11-17: 25 mg via ORAL

## 2017-11-17 MED ORDER — PROCHLORPERAZINE MALEATE 10 MG PO TABS: 10.0000 mg | ORAL_TABLET | Freq: Four times a day (QID) | ORAL | 1 refills | Status: AC | PRN

## 2017-11-17 MED ORDER — ACETAMINOPHEN 325 MG PO TABS
650.0000 mg | ORAL_TABLET | Freq: Once | ORAL | Status: AC
  Administered 2017-11-17: 650 mg via ORAL

## 2017-11-17 MED ORDER — DEXAMETHASONE SODIUM PHOSPHATE 10 MG/ML IJ SOLN
10.0000 mg | Freq: Once | INTRAMUSCULAR | Status: AC
  Administered 2017-11-17: 10 mg via INTRAVENOUS
  Filled 2017-11-17: qty 1

## 2017-11-17 MED ORDER — SODIUM CHLORIDE 0.9 % IV SOLN
250.0000 mL | Freq: Once | INTRAVENOUS | Status: AC
  Administered 2017-11-17: 250 mL via INTRAVENOUS

## 2017-11-17 MED ORDER — ACETAMINOPHEN 325 MG PO TABS
ORAL_TABLET | ORAL | Status: AC
  Filled 2017-11-17: qty 2

## 2017-11-17 MED ORDER — ONDANSETRON HCL 8 MG PO TABS: 8.0000 mg | ORAL_TABLET | Freq: Two times a day (BID) | ORAL | 1 refills | Status: AC

## 2017-11-17 NOTE — Progress Notes (Signed)
Tolerated chemotherapy infusion and blood transfusion w/o adverse reaction.  Alert, in no distress.  VSS.  Discharged ambulatory in c/o family.

## 2017-11-17 NOTE — Progress Notes (Signed)
Patient on plan of care prior to pathways. 

## 2017-11-17 NOTE — Patient Instructions (Signed)
[facility] Discharge Instructions  Today you saw Dr. K.   Thank you for choosing [facility] at [facility] to provide your oncology and hematology care.  To afford each patient quality time with our provider, please arrive at least 15 minutes before your scheduled appointment time.   If you have a lab appointment with the Cancer Center please come in thru the  Main Entrance and check in at the main information desk  You need to re-schedule your appointment should you arrive 10 or more minutes late.  We strive to give you quality time with our providers, and arriving late affects you and other patients whose appointments are after yours.  Also, if you no show three or more times for appointments you may be dismissed from the clinic at the providers discretion.     Again, thank you for choosing [facility].  Our hope is that these requests will decrease the amount of time that you wait before being seen by our physicians.       _____________________________________________________________  Should you have questions after your visit to [facility], please contact our office at [phone] between the hours of 8:30 a.m. and 4:30 p.m.  Voicemails left after 4:30 p.m. will not be returned until the following business day.  For prescription refill requests, have your pharmacy contact our office.       Resources For Cancer Patients and their Caregivers ? American Cancer Society: Can assist with transportation, wigs, general needs, runs Look Good Feel Better.        1-888-227-6333 ? Cancer Care: Provides financial assistance, online support groups, medication/co-pay assistance.  1-800-813-HOPE (4673) ? [facility] Assists [place] cancer patients and their families through emotional , educational and financial support.  336-427-4357 ? [place] DSS Where to apply for food  stamps, Medicaid and utility assistance. 336-342-1394 ? RCATS: Transportation to medical appointments. 336-347-2287 ? Social Security Administration: May apply for disability if have a Stage IV cancer. 336-342-7796 1-800-772-1213 ? [place] Aging, Disability and Transit Services: Assists with nutrition, care and transit needs. 336-349-2343  Cancer Center Support Programs:   > Cancer Support Group  2nd Tuesday of the month 1pm-2pm, Journey Room   > Creative Journey  3rd Tuesday of the month 1130am-1pm, Journey Room    

## 2017-11-17 NOTE — Progress Notes (Signed)
START ON PATHWAY REGIMEN - MDS     A cycle is every 28 days:     Azacitidine   **Always confirm dose/schedule in your pharmacy ordering system**    Patient Characteristics: Higher-Risk (IPSS-R Score > 3.5), First Line, Not a Transplant Candidate WHO Disease Classification: MDS-MLD Bone Marrow Blasts (percent): ? 2% Cytogenetic Category: Poor Platelets (x 10^9/L): 50 to < 100 Absolute Neutrophil Count (x 10^9/L): ? 0.8 Line of Therapy: First Line IPSS-R Risk Category: High IPSS-R Risk Score: 5 Check here if patient's risk score was calculated prior to the International Prognostic Scoring System-Revised (IPSS-R): false Hemoglobin (g/dl): < 8 Patient Characteristics: Not a Transplant Candidate Intent of Therapy: Non-Curative / Palliative Intent, Discussed with Patient

## 2017-11-17 NOTE — Progress Notes (Signed)
[facility] [place], [place] [address]   CLINIC:  Medical Oncology/Hematology  PCP:  Chevis Pretty, [address] [place] [address] [phone]   REASON FOR VISIT:  Follow-up for MDS.  CURRENT THERAPY: Azacitidine to be started.  BRIEF ONCOLOGIC HISTORY:    Small cell carcinoma of right lung ([facility])   11/28/2015 Imaging    Large anterior mediastinal mass in continuity with a R hilar mass, narrowing of SVC, abnormal densities in RUL, 9 mm spiculated nodule, nonspec. hypodensity in liver      12/05/2015 PET scan    Large hypermetabolic paratracheal mass c/w SCLC, perihilar nodular densities in RML, mild metabolic activity RUL nodule, no distant metastatic disease      12/11/2015 Procedure    Video bronch with biopsies and brushings, endobronchial ultrasound with mediastinal LN aspiration. Dr. Roxan Hockey      12/11/2015 Pathology Results    Trachea biopsy negative, FNA RUL malignant cells c/w SCLC      12/13/2015 - 03/15/2016 Chemotherapy    Cisplatin/Etoposide x 5 cycles.  Cycle #6 cancelled due to significant cytopenias      12/25/2015 Imaging    MRI brain- No intracranial parenchymal enhancing lesion. Tiny right frontal calvarial enhancing lesion. Small metastatic lesion not excluded although this may represent an incidentally detected benign process.      12/29/2015 - 02/13/2016 Radiation Therapy    XRT      02/14/2016 Treatment Plan Change    Chemotherapy deferred x 1 week      02/21/2016 Treatment Plan Change    Carboplatin dose reduced by 20% and Etoposide dose reduced by 10%.      04/24/2016 Imaging    CT CAP- Chest Impression:  1. Marked reduction in mediastinal and RIGHT hilar lymphadenopathy with no residual pathologically enlarged nodes. 2. Resolution of perihilar nodularity in the RIGHT lung which was hypermetabolic on FDG PET. 3. Persistent nodule in the RIGHT upper lobe which is not hypermetabolic. 4. New  ground-glass nodule in the RIGHT upper lobe is nonspecific and may relate to therapy.  Abdomen / Pelvis Impression:  1. No evidence of metastatic disease in the abdomen pelvis. 2. Atherosclerotic calcification of the aorta.      04/24/2016 Imaging    Bone scan- Nonspecific increased tracer localization at approximately T11 vertebral body; this is of uncertain etiology, could represent a degenerative process though metastatic disease is not excluded.      04/29/2016 -  Radiation Therapy    PCI -- 25 Gy      07/24/2016 Imaging    CT CAP- 1. There has been interval decrease in size of mediastinal lymph nodes. The index lesion within the right upper lobe is also decreased in size from previous exam. 2. New subpleural nodule is noted within the posteromedial right lower lobe. Attention on follow-up imaging advised. 3. Stable ground-glass attenuating nodule within the right lower lobe and anteromedial left upper lobe 4. Aortic atherosclerosis and coronary artery calcification      07/29/2016 Imaging    MRI t-spine: 1. Stable benign slightly enhancing sclerotic lesion in the right posterolateral aspect of the T11 vertebral body, unchanged for 5 years. This is felt to account for the subtle increased activity at T11 on the SPECT bone scan. 2. Slight degenerative disc disease with small disc protrusions at T1-2 and T2-3 and T12-L1 with no neural impingement. 3. No evidence of metastatic disease.      09/23/2016 Imaging    CT  chest- Radiation changes in the central right upper lobe/ perihilar region.  Stable small mediastinal lymph nodes, unchanged.  No findings suspicious for new/progressive metastatic disease.      12/25/2016 Imaging    CT C/A/P: IMPRESSION: 1. Continued further decrease in mediastinal lymph nodes. 2. Index nodule antero medial right upper lobe has decreased. The new 5 mm right lower lobe subpleural nodule seen on the previous study has resolved in the interval.  Ground-glass nodules in the upper lobes bilaterally are stable. 3. Interval development of interstitial and alveolar opacity in the parahilar right lung is presumably radiation related. Attention on follow-up recommended. 4. New tiny right pleural effusion with areas of apparent pleural enhancement in the posterior right costophrenic sulcus. Close attention on follow-up recommended. 5. Marked abdominal aortic atherosclerosis with likely infrarenal significant stenosis.       07/02/2017 Imaging    CT Chest w/ contrast: Resolution of central right lower lobe infiltrate and right pleural effusion since prior study.  Stable ground-glass opacities in both upper lobes.  No new or progressive disease      11/16/2017 -  Chemotherapy    The patient had palonosetron (ALOXI) injection 0.25 mg, 0.25 mg, Intravenous,  Once, 1 of 4 cycles azaCITIDine (VIDAZA) 130 mg in sodium chloride 0.9 % 50 mL chemo infusion, 75 mg/m2 = 130 mg, Intravenous, Once, 1 of 4 cycles  for chemotherapy treatment.        MDS (myelodysplastic syndrome), high grade ([facility])   10/20/2017 Initial Diagnosis    MDS (myelodysplastic syndrome), high grade ([facility])      11/16/2017 -  Chemotherapy    The patient had palonosetron (ALOXI) injection 0.25 mg, 0.25 mg, Intravenous,  Once, 1 of 4 cycles azaCITIDine (VIDAZA) 130 mg in sodium chloride 0.9 % 50 mL chemo infusion, 75 mg/m2 = 130 mg, Intravenous, Once, 1 of 4 cycles  for chemotherapy treatment.          INTERVAL HISTORY:  Ms. Shealy 82 y.o. female returns for follow-up of MDS and to talk about treatment options.  She has been feeling weak lately.  She is accompanied by her 2 daughters.  She gets tired easily.  She is also short of breath.  She denies any chest pains or lightheadedness.  She denies any fevers or infections in the last 4 weeks.  No hospitalizations were reported.  Denies any nausea, vomiting, diarrhea or constipation.  Weight has been stable.  REVIEW  OF SYSTEMS:  Review of Systems  Constitutional: Positive for fatigue.  All other systems reviewed and are negative.    PAST MEDICAL/SURGICAL HISTORY:  Past Medical History:  Diagnosis Date  . Allergy   . Cancer ([facility])    lung  . Hyperlipidemia   . Iron deficiency anemia 12/15/2015  . Low vitamin B12 level 12/15/2015  . Osteopenia    Past Surgical History:  Procedure Laterality Date  . CATARACT EXTRACTION, BILATERAL Bilateral   . COLONOSCOPY N/A 12/06/2016   Procedure: COLONOSCOPY;  Surgeon: Danie Binder, MD;  Location: [facility];  Service: Endoscopy;  Laterality: N/A;  1:45pm  . ESOPHAGOGASTRODUODENOSCOPY N/A 12/06/2016   Procedure: ESOPHAGOGASTRODUODENOSCOPY (EGD);  Surgeon: Danie Binder, MD;  Location: [facility];  Service: Endoscopy;  Laterality: N/A;  . MINOR EXCISION EAR CANAL CYST Left    30+ years ago  . SMALL INTESTINE SURGERY    . VIDEO BRONCHOSCOPY WITH ENDOBRONCHIAL ULTRASOUND N/A 12/11/2015   Procedure: VIDEO BRONCHOSCOPY WITH ENDOBRONCHIAL ULTRASOUND;  Surgeon: Melrose Nakayama, MD;  Location: [facility] OR;  Service: Thoracic;  Laterality: N/A;     SOCIAL HISTORY:  Social History   Socioeconomic History  . Marital status: Married    Spouse name: Not on file  . Number of children: Not on file  . Years of education: Not on file  . Highest education level: Not on file  Occupational History  . Not on file  Social Needs  . Financial resource strain: Not on file  . Food insecurity:    Worry: Not on file    Inability: Not on file  . Transportation needs:    Medical: Not on file    Non-medical: Not on file  Tobacco Use  . Smoking status: Former Smoker    Packs/day: 0.25    Years: 50.00    Pack years: 12.50    Last attempt to quit: 01/08/2016    Years since quitting: 1.8  . Smokeless tobacco: Never Used  Substance and Sexual Activity  . Alcohol use: No  . Drug use: No  . Sexual activity: Not Currently  Lifestyle  . Physical activity:    Days  per week: Not on file    Minutes per session: Not on file  . Stress: Not on file  Relationships  . Social connections:    Talks on phone: Not on file    Gets together: Not on file    Attends religious service: Not on file    Active member of club or organization: Not on file    Attends meetings of clubs or organizations: Not on file    Relationship status: Not on file  . Intimate partner violence:    Fear of current or ex partner: Not on file    Emotionally abused: Not on file    Physically abused: Not on file    Forced sexual activity: Not on file  Other Topics Concern  . Not on file  Social History Narrative  . Not on file    FAMILY HISTORY:  Family History  Problem Relation Age of Onset  . Cancer Mother   . Hypertension Father   . Colon cancer Neg Hx     CURRENT MEDICATIONS:  Outpatient Encounter Medications as of 11/17/2017  Medication Sig  . azithromycin (ZITHROMAX Z-PAK) 250 MG tablet As directed  . cyanocobalamin (,VITAMIN B-12,) 1000 MCG/ML injection Inject 1 mL (1,000 mcg total) into the muscle every 30 (thirty) days.  Marland Kitchen donepezil (ARICEPT) 10 MG tablet Take 1 tablet (10 mg total) by mouth at bedtime.   No facility-administered encounter medications on file as of 11/17/2017.     ALLERGIES:  Allergies  Allergen Reactions  . Flagyl [Metronidazole Hcl] Nausea Only  . Penicillins Other (See Comments)    Has patient had a PCN reaction causing immediate rash, facial/tongue/throat swelling, SOB or lightheadedness with hypotension: unknown Has patient had a PCN reaction causing severe rash involving mucus membranes or skin necrosis: unknown Has patient had a PCN reaction that required hospitalization unknown Has patient had a PCN reaction occurring within the last 10 years: unknown  If all of the above answers are "NO", then may proceed with Cephalosporin use.     PHYSICAL EXAM:  ECOG Performance status: 1  Vitals:   11/17/17 0847  BP: (!) 106/49  Pulse: 88    Resp: 18  Temp: 97.6 F (36.4 C)  SpO2: 100%   Filed Weights   11/17/17 0847  Weight: 142 lb 11.2 oz (64.7 kg)    Physical Exam Deferred.  LABORATORY  DATA:  I have reviewed the labs as listed.  CBC    Component Value Date/Time   WBC 3.4 (L) 11/17/2017 0755   RBC 2.47 (L) 11/17/2017 0755   HGB 7.0 (L) 11/17/2017 0755   HCT 22.1 (L) 11/17/2017 0755   PLT 71 (L) 11/17/2017 0755   MCV 89.5 11/17/2017 0755   MCV 71.9 (A) 08/20/2013 1559   MCH 28.3 11/17/2017 0755   MCHC 31.7 11/17/2017 0755   RDW 16.8 (H) 11/17/2017 0755   LYMPHSABS 0.4 (L) 11/17/2017 0755   MONOABS 0.2 11/17/2017 0755   EOSABS 0.1 11/17/2017 0755   BASOSABS 0.0 11/17/2017 0755   CMP Latest Ref Rng & Units 11/17/2017 10/27/2017 10/03/2017  Glucose 65 - 99 mg/dL 120(H) 91 95  BUN 6 - 20 mg/dL 33(H) 24(H) 29(H)  Creatinine 0.44 - 1.00 mg/dL 1.34(H) 1.26(H) 1.26(H)  Sodium 135 - 145 mmol/L 141 137 141  Potassium 3.5 - 5.1 mmol/L 4.0 3.8 4.0  Chloride 101 - 111 mmol/L 108 102 104  CO2 22 - 32 mmol/L 25 27 28   Calcium 8.9 - 10.3 mg/dL 8.9 8.6(L) 8.8(L)  Total Protein 6.5 - 8.1 g/dL 6.2(L) 6.4(L) -  Total Bilirubin 0.3 - 1.2 mg/dL 1.1 1.1 -  Alkaline Phos 38 - 126 U/L 62 63 -  AST 15 - 41 U/L 22 23 -  ALT 14 - 54 U/L 12(L) 13(L) -            ASSESSMENT & PLAN:   MDS (myelodysplastic syndrome), high grade ([facility]) 1.  MDS, high risk by IPSS-R: Bone marrow aspiration and biopsy done on 10/03/2017 shows erythroid hyperplasia, increased in erythroid precursors with atypia (less than 10%) in the form of nuclear irregularities, nuclear budding and multi nucleation.  Megakaryocytes are increased with frequent small, hyperlobulated and hyperchromatic forms.  Granulocytic precursors have no significant dysplasia. -Chromosome analysis shows 1, XX, del(3)(q21q29),add(7)(p15),del(13)(q12q14)[6]/46,XX[14] -FISH results show 5 q-, loss of chromosome 7, and trisomy 8. -She received a total of 5 points based on revised  IPSS system, with a median survival in the absence of therapy of 1.6 years, 25% AML progression in the absence of therapy and 1.4 years.  She has received chemotherapy with carboplatin and etoposide in 2017.  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  She has been receiving Aranesp for more than a year, dose increased to 200 mcg every 2 weeks in December 2019.  We have given Aranesp at last visit a week ago.  She completely lost response to erythropoiesis stimulating agents. -We talked about the initiating her on hypo-methylating agents with azacitidine 75 mg per M square, on a 5-day schedule every 28 days.  We talked about the response rates about 40 to 50%.  Responses are usually seen after 4-6 cycles.  We discussed the side effects in detail.  She gives [place] permission to proceed with the treatment.  She is complaining of feeling fatigued.  She has a hemoglobin of 7 today.  We will also give her 1 unit of blood transfusion.  I will see her back in 2 weeks to check nadir counts.  2.  Weight loss: She had lost about 7 pounds about 6 weeks ago.  She is drinking Ensure and eating 3 meals per day.  Lately her weight has been stable.  3.  Small cell lung cancer: She has completed chemoradiation therapy around July 2017.  Her last CT scan was in December 2018.  We will plan to repeat CT scan soon.  Orders placed this encounter:  Orders Placed This Encounter  Procedures  . CBC with Differential  . Comprehensive metabolic panel  . Lactate dehydrogenase      Derek Jack, MD [place] [phone]

## 2017-11-17 NOTE — Addendum Note (Signed)
Addended by: Derek Jack on: 11/17/2017 12:43 PM   Modules accepted: Orders

## 2017-11-17 NOTE — Assessment & Plan Note (Signed)
1.  MDS, high risk by IPSS-R: Bone marrow aspiration and biopsy done on 10/03/2017 shows erythroid hyperplasia, increased in erythroid precursors with atypia (less than 10%) in the form of nuclear irregularities, nuclear budding and multi nucleation.  Megakaryocytes are increased with frequent small, hyperlobulated and hyperchromatic forms.  Granulocytic precursors have no significant dysplasia. -Chromosome analysis shows 52, XX, del(3)(q21q29),add(7)(p15),del(13)(q12q14)[6]/46,XX[14] -FISH results show 5 q-, loss of chromosome 7, and trisomy 8. -She received a total of 5 points based on revised IPSS system, with a median survival in the absence of therapy of 1.6 years, 25% AML progression in the absence of therapy and 1.4 years.  She has received chemotherapy with carboplatin and etoposide in 2017.  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  She has been receiving Aranesp for more than a year, dose increased to 200 mcg every 2 weeks in December 2019.  We have given Aranesp at last visit a week ago.  She completely lost response to erythropoiesis stimulating agents. -We talked about the initiating her on hypo-methylating agents with azacitidine 75 mg per M square, on a 5-day schedule every 28 days.  We talked about the response rates about 40 to 50%.  Responses are usually seen after 4-6 cycles.  We discussed the side effects in detail.  She gives [place] permission to proceed with the treatment.  She is complaining of feeling fatigued.  She has a hemoglobin of 7 today.  We will also give her 1 unit of blood transfusion.  I will see her back in 2 weeks to check nadir counts.  2.  Weight loss: She had lost about 7 pounds about 6 weeks ago.  She is drinking Ensure and eating 3 meals per day.  Lately her weight has been stable.  3.  Small cell lung cancer: She has completed chemoradiation therapy around July 2017.  Her last CT scan was in December 2018.  We will plan to repeat CT scan soon.

## 2017-11-18 ENCOUNTER — Inpatient Hospital Stay (HOSPITAL_COMMUNITY): Payer: Medicare Other

## 2017-11-18 VITALS — BP 103/48 | HR 70 | Temp 97.6°F | Resp 16

## 2017-11-18 DIAGNOSIS — C3491 Malignant neoplasm of unspecified part of right bronchus or lung: Secondary | ICD-10-CM

## 2017-11-18 DIAGNOSIS — D46Z Other myelodysplastic syndromes: Secondary | ICD-10-CM

## 2017-11-18 DIAGNOSIS — Z5111 Encounter for antineoplastic chemotherapy: Secondary | ICD-10-CM | POA: Diagnosis not present

## 2017-11-18 LAB — TYPE AND SCREEN
ABO/RH(D): A POS
Antibody Screen: NEGATIVE
Unit division: 0

## 2017-11-18 LAB — BPAM RBC
Blood Product Expiration Date: 201905192359
ISSUE DATE / TIME: 201904291044
UNIT TYPE AND RH: 6200

## 2017-11-18 MED ORDER — DEXAMETHASONE SODIUM PHOSPHATE 10 MG/ML IJ SOLN
10.0000 mg | Freq: Once | INTRAMUSCULAR | Status: AC
  Administered 2017-11-18: 10 mg via INTRAVENOUS

## 2017-11-18 MED ORDER — DEXAMETHASONE SODIUM PHOSPHATE 10 MG/ML IJ SOLN
INTRAMUSCULAR | Status: AC
  Filled 2017-11-18: qty 1

## 2017-11-18 MED ORDER — HEPARIN SOD (PORK) LOCK FLUSH 100 UNIT/ML IV SOLN: 500.0000 [IU] | Freq: Once | INTRAVENOUS | Status: DC | PRN

## 2017-11-18 MED ORDER — SODIUM CHLORIDE 0.9 % IV SOLN
75.0000 mg/m2 | Freq: Once | INTRAVENOUS | Status: AC
  Administered 2017-11-18: 130 mg via INTRAVENOUS
  Filled 2017-11-18: qty 13

## 2017-11-18 MED ORDER — SODIUM CHLORIDE 0.9 % IV SOLN
10.0000 mg | Freq: Once | INTRAVENOUS | Status: DC
  Filled 2017-11-18: qty 1

## 2017-11-18 MED ORDER — SODIUM CHLORIDE 0.9 % IV SOLN
Freq: Once | INTRAVENOUS | Status: AC
  Administered 2017-11-18: 09:00:00 via INTRAVENOUS

## 2017-11-18 MED ORDER — SODIUM CHLORIDE 0.9% FLUSH
10.0000 mL | INTRAVENOUS | Status: DC | PRN
  Administered 2017-11-18: 10 mL
  Filled 2017-11-18: qty 10

## 2017-11-18 NOTE — Progress Notes (Signed)
Tolerated infusion w/o adverse reaction.  Alert, in no distress.  Discharged ambulatory in c/o family.

## 2017-11-19 ENCOUNTER — Encounter (HOSPITAL_COMMUNITY): Payer: Self-pay

## 2017-11-19 ENCOUNTER — Inpatient Hospital Stay (HOSPITAL_COMMUNITY): Payer: Medicare Other | Attending: Internal Medicine

## 2017-11-19 VITALS — BP 120/51 | HR 68 | Temp 97.5°F | Resp 18

## 2017-11-19 DIAGNOSIS — K5900 Constipation, unspecified: Secondary | ICD-10-CM | POA: Insufficient documentation

## 2017-11-19 DIAGNOSIS — C3491 Malignant neoplasm of unspecified part of right bronchus or lung: Secondary | ICD-10-CM | POA: Diagnosis present

## 2017-11-19 DIAGNOSIS — Z9181 History of falling: Secondary | ICD-10-CM | POA: Diagnosis not present

## 2017-11-19 DIAGNOSIS — R21 Rash and other nonspecific skin eruption: Secondary | ICD-10-CM | POA: Diagnosis not present

## 2017-11-19 DIAGNOSIS — Z5111 Encounter for antineoplastic chemotherapy: Secondary | ICD-10-CM | POA: Diagnosis not present

## 2017-11-19 DIAGNOSIS — D46Z Other myelodysplastic syndromes: Secondary | ICD-10-CM | POA: Insufficient documentation

## 2017-11-19 MED ORDER — SODIUM CHLORIDE 0.9% FLUSH
10.0000 mL | INTRAVENOUS | Status: DC | PRN
  Administered 2017-11-19: 10 mL
  Filled 2017-11-19: qty 10

## 2017-11-19 MED ORDER — SODIUM CHLORIDE 0.9 % IV SOLN
75.0000 mg/m2 | Freq: Once | INTRAVENOUS | Status: AC
  Administered 2017-11-19: 130 mg via INTRAVENOUS
  Filled 2017-11-19: qty 13

## 2017-11-19 MED ORDER — PALONOSETRON HCL INJECTION 0.25 MG/5ML
0.2500 mg | Freq: Once | INTRAVENOUS | Status: AC
  Administered 2017-11-19: 0.25 mg via INTRAVENOUS
  Filled 2017-11-19: qty 5

## 2017-11-19 MED ORDER — DEXAMETHASONE SODIUM PHOSPHATE 10 MG/ML IJ SOLN
10.0000 mg | Freq: Once | INTRAMUSCULAR | Status: AC
  Administered 2017-11-19: 10 mg via INTRAVENOUS

## 2017-11-19 MED ORDER — SODIUM CHLORIDE 0.9 % IV SOLN
Freq: Once | INTRAVENOUS | Status: AC
  Administered 2017-11-19: 08:00:00 via INTRAVENOUS

## 2017-11-19 MED ORDER — DEXAMETHASONE SODIUM PHOSPHATE 10 MG/ML IJ SOLN
INTRAMUSCULAR | Status: AC
  Filled 2017-11-19: qty 1

## 2017-11-19 MED ORDER — HEPARIN SOD (PORK) LOCK FLUSH 100 UNIT/ML IV SOLN
500.0000 [IU] | Freq: Once | INTRAVENOUS | Status: AC | PRN
  Administered 2017-11-19: 500 [IU]
  Filled 2017-11-19: qty 5

## 2017-11-19 MED ORDER — SODIUM CHLORIDE 0.9 % IV SOLN: 10.0000 mg | Freq: Once | INTRAVENOUS | Status: DC

## 2017-11-19 NOTE — Progress Notes (Signed)
Janet Fletcher tolerated Vidaza infusion well without complaints or incident. Did not need to wait 30 mins after giving Aloxi to give Vidaza infusion today per pharmacist and MD since pt received Aloxi on Monday as well. VSS upon discharge. Pt discharged self ambulatory in satisfactory condition accompanied by her daughter

## 2017-11-19 NOTE — Patient Instructions (Signed)
[facility] Discharge Instructions for Patients Receiving Chemotherapy   Beginning January 23rd 2017 lab work for the [facility] will be done in the  Main lab at [facility] on 1st floor. If you have a lab appointment with the [facility] please come in thru the  Main Entrance and check in at the main information desk   Today you received the following chemotherapy agents Vidaza. Follow-up as scheduled. Call clinic for any questions or concerns  To help prevent nausea and vomiting after your treatment, we encourage you to take your nausea medication   If you develop nausea and vomiting, or diarrhea that is not controlled by your medication, call the clinic.  The clinic phone number is [phone]. Office hours are Monday-Friday 8:30am-5:00pm.  BELOW ARE SYMPTOMS THAT SHOULD BE REPORTED IMMEDIATELY:  *FEVER GREATER THAN 101.0 F  *CHILLS WITH OR WITHOUT FEVER  NAUSEA AND VOMITING THAT IS NOT CONTROLLED WITH YOUR NAUSEA MEDICATION  *UNUSUAL SHORTNESS OF BREATH  *UNUSUAL BRUISING OR BLEEDING  TENDERNESS IN MOUTH AND THROAT WITH OR WITHOUT PRESENCE OF ULCERS  *URINARY PROBLEMS  *BOWEL PROBLEMS  UNUSUAL RASH Items with * indicate a potential emergency and should be followed up as soon as possible. If you have an emergency after office hours please contact your primary care physician or go to the nearest emergency department.  Please call the clinic during office hours if you have any questions or concerns.   You may also contact the Patient Navigator at [phone] should you have any questions or need assistance in obtaining follow up care.      Resources For Cancer Patients and their Caregivers ? American Cancer Society: Can assist with transportation, wigs, general needs, runs Look Good Feel Better.        667-658-8306 ? Cancer Care: Provides financial assistance, online support groups, medication/co-pay assistance.  1-800-813-HOPE  289-400-4135) ? [place] Assists [place] cancer patients and their families through emotional , educational and financial support.  458-500-0667 ? [place] DSS Where to apply for food stamps, Medicaid and utility assistance. 306 134 1420 ? RCATS: Transportation to medical appointments. (586)179-4483 ? Social Security Administration: May apply for disability if have a Stage IV cancer. 581-464-5900 [phone] ? [employer], Disability and Transit Services: Assists with nutrition, care and transit needs. [phone]

## 2017-11-20 ENCOUNTER — Encounter (HOSPITAL_COMMUNITY): Payer: Self-pay

## 2017-11-20 ENCOUNTER — Inpatient Hospital Stay (HOSPITAL_COMMUNITY): Payer: Medicare Other

## 2017-11-20 VITALS — BP 116/55 | HR 68 | Temp 97.6°F | Resp 18

## 2017-11-20 DIAGNOSIS — Z5111 Encounter for antineoplastic chemotherapy: Secondary | ICD-10-CM | POA: Diagnosis not present

## 2017-11-20 DIAGNOSIS — C3491 Malignant neoplasm of unspecified part of right bronchus or lung: Secondary | ICD-10-CM

## 2017-11-20 DIAGNOSIS — D46Z Other myelodysplastic syndromes: Secondary | ICD-10-CM

## 2017-11-20 MED ORDER — SODIUM CHLORIDE 0.9 % IV SOLN
10.0000 mg | Freq: Once | INTRAVENOUS | Status: DC
  Filled 2017-11-20: qty 1

## 2017-11-20 MED ORDER — SODIUM CHLORIDE 0.9% FLUSH
10.0000 mL | INTRAVENOUS | Status: DC | PRN
  Administered 2017-11-20: 10 mL
  Filled 2017-11-20: qty 10

## 2017-11-20 MED ORDER — SODIUM CHLORIDE 0.9 % IV SOLN
Freq: Once | INTRAVENOUS | Status: AC
  Administered 2017-11-20: 500 mL via INTRAVENOUS

## 2017-11-20 MED ORDER — HEPARIN SOD (PORK) LOCK FLUSH 100 UNIT/ML IV SOLN
INTRAVENOUS | Status: AC
Start: 2017-11-20 — End: ?
  Filled 2017-11-20: qty 5

## 2017-11-20 MED ORDER — HEPARIN SOD (PORK) LOCK FLUSH 100 UNIT/ML IV SOLN
500.0000 [IU] | Freq: Once | INTRAVENOUS | Status: AC | PRN
  Administered 2017-11-20: 500 [IU]

## 2017-11-20 MED ORDER — DEXAMETHASONE SODIUM PHOSPHATE 10 MG/ML IJ SOLN
10.0000 mg | Freq: Once | INTRAMUSCULAR | Status: AC
  Administered 2017-11-20: 10 mg via INTRAVENOUS

## 2017-11-20 MED ORDER — DEXAMETHASONE SODIUM PHOSPHATE 10 MG/ML IJ SOLN
INTRAMUSCULAR | Status: AC
  Filled 2017-11-20: qty 1

## 2017-11-20 MED ORDER — AZACITIDINE CHEMO INJECTION 100 MG
75.0000 mg/m2 | Freq: Once | INTRAMUSCULAR | Status: AC
  Administered 2017-11-20: 130 mg via INTRAVENOUS
  Filled 2017-11-20: qty 13

## 2017-11-20 NOTE — Progress Notes (Signed)
Patient to treatment area for day 4 Vidaza.  Patient denied nausea, constipation or diarrhea.  Stated prn itching on back but no rash visualized today with inspection.  Stated on Monday after treatment she noted a small amount of bright red blood with bath when she cleaned her bottom.  No blood noted anymore this week.  Denied hemorrhoids.  Reviewed with Dr. Delton Coombes with ok to treat today.    Patient tolerated treatment with no complaints voiced.  Port flushed with no complaints of pain.  Good blood return noted before and after administration of treatment.  Site clean and dry with no bruising or swelling noted at site.  Dressing intact.  VSS with discharge and left ambulatory with family with no s/s of distress noted.

## 2017-11-20 NOTE — Patient Instructions (Signed)
[facility] Discharge Instructions for Patients Receiving Chemotherapy  Today you received the following chemotherapy agents vidaza today.     If you develop nausea and vomiting that is not controlled by your nausea medication, call the clinic.   BELOW ARE SYMPTOMS THAT SHOULD BE REPORTED IMMEDIATELY:  *FEVER GREATER THAN 100.5 F  *CHILLS WITH OR WITHOUT FEVER  NAUSEA AND VOMITING THAT IS NOT CONTROLLED WITH YOUR NAUSEA MEDICATION  *UNUSUAL SHORTNESS OF BREATH  *UNUSUAL BRUISING OR BLEEDING  TENDERNESS IN MOUTH AND THROAT WITH OR WITHOUT PRESENCE OF ULCERS  *URINARY PROBLEMS  *BOWEL PROBLEMS  UNUSUAL RASH Items with * indicate a potential emergency and should be followed up as soon as possible.  Feel free to call the clinic should you have any questions or concerns. The clinic phone number is [phone].  Please show the [facility] at check-in to the Emergency Department and triage nurse.

## 2017-11-21 ENCOUNTER — Encounter (HOSPITAL_COMMUNITY): Payer: Self-pay

## 2017-11-21 ENCOUNTER — Inpatient Hospital Stay (HOSPITAL_COMMUNITY): Payer: Medicare Other

## 2017-11-21 VITALS — BP 111/54 | HR 74 | Temp 97.6°F | Resp 16 | Wt 142.2 lb

## 2017-11-21 DIAGNOSIS — Z5111 Encounter for antineoplastic chemotherapy: Secondary | ICD-10-CM | POA: Diagnosis not present

## 2017-11-21 DIAGNOSIS — C3491 Malignant neoplasm of unspecified part of right bronchus or lung: Secondary | ICD-10-CM

## 2017-11-21 DIAGNOSIS — D46Z Other myelodysplastic syndromes: Secondary | ICD-10-CM

## 2017-11-21 MED ORDER — SODIUM CHLORIDE 0.9 % IV SOLN: 10.0000 mg | Freq: Once | INTRAVENOUS | Status: DC

## 2017-11-21 MED ORDER — PALONOSETRON HCL INJECTION 0.25 MG/5ML
0.2500 mg | Freq: Once | INTRAVENOUS | Status: AC
  Administered 2017-11-21: 0.25 mg via INTRAVENOUS

## 2017-11-21 MED ORDER — HEPARIN SOD (PORK) LOCK FLUSH 100 UNIT/ML IV SOLN
500.0000 [IU] | Freq: Once | INTRAVENOUS | Status: AC | PRN
  Administered 2017-11-21: 500 [IU]

## 2017-11-21 MED ORDER — SODIUM CHLORIDE 0.9 % IV SOLN
Freq: Once | INTRAVENOUS | Status: AC
  Administered 2017-11-21: 09:00:00 via INTRAVENOUS

## 2017-11-21 MED ORDER — PALONOSETRON HCL INJECTION 0.25 MG/5ML
INTRAVENOUS | Status: AC
  Filled 2017-11-21: qty 5

## 2017-11-21 MED ORDER — SODIUM CHLORIDE 0.9% FLUSH
10.0000 mL | INTRAVENOUS | Status: DC | PRN
  Administered 2017-11-21: 10 mL
  Filled 2017-11-21: qty 10

## 2017-11-21 MED ORDER — SODIUM CHLORIDE 0.9 % IV SOLN
75.0000 mg/m2 | Freq: Once | INTRAVENOUS | Status: AC
  Administered 2017-11-21: 130 mg via INTRAVENOUS
  Filled 2017-11-21: qty 13

## 2017-11-21 MED ORDER — DEXAMETHASONE SODIUM PHOSPHATE 10 MG/ML IJ SOLN
INTRAMUSCULAR | Status: AC
  Filled 2017-11-21: qty 1

## 2017-11-21 MED ORDER — DEXAMETHASONE SODIUM PHOSPHATE 10 MG/ML IJ SOLN
10.0000 mg | Freq: Once | INTRAMUSCULAR | Status: AC
  Administered 2017-11-21: 10 mg via INTRAVENOUS

## 2017-11-21 NOTE — Progress Notes (Signed)
Lilli Light Falck tolerated Vidaza infusion well without complaints or incident. VSS upon discharge. Pt discharged self ambulatory in satisfactory condition accompanied by her daughter

## 2017-11-21 NOTE — Patient Instructions (Signed)
[facility] Discharge Instructions for Patients Receiving Chemotherapy   Beginning January 23rd 2017 lab work for the [facility] will be done in the  Main lab at [facility] on 1st floor. If you have a lab appointment with the [facility] please come in thru the  Main Entrance and check in at the main information desk   Today you received the following chemotherapy agents Vidaza. Follow-up as scheduled. Call clinic for any questions or concerns  To help prevent nausea and vomiting after your treatment, we encourage you to take your nausea medication   If you develop nausea and vomiting, or diarrhea that is not controlled by your medication, call the clinic.  The clinic phone number is [phone]. Office hours are Monday-Friday 8:30am-5:00pm.  BELOW ARE SYMPTOMS THAT SHOULD BE REPORTED IMMEDIATELY:  *FEVER GREATER THAN 101.0 F  *CHILLS WITH OR WITHOUT FEVER  NAUSEA AND VOMITING THAT IS NOT CONTROLLED WITH YOUR NAUSEA MEDICATION  *UNUSUAL SHORTNESS OF BREATH  *UNUSUAL BRUISING OR BLEEDING  TENDERNESS IN MOUTH AND THROAT WITH OR WITHOUT PRESENCE OF ULCERS  *URINARY PROBLEMS  *BOWEL PROBLEMS  UNUSUAL RASH Items with * indicate a potential emergency and should be followed up as soon as possible. If you have an emergency after office hours please contact your primary care physician or go to the nearest emergency department.  Please call the clinic during office hours if you have any questions or concerns.   You may also contact the Patient Navigator at [phone] should you have any questions or need assistance in obtaining follow up care.      Resources For Cancer Patients and their Caregivers ? American Cancer Society: Can assist with transportation, wigs, general needs, runs Look Good Feel Better.        615-776-2297 ? Cancer Care: Provides financial assistance, online support groups, medication/co-pay assistance.  1-800-813-HOPE  (661) 280-5085) ? [place] Assists [place] cancer patients and their families through emotional , educational and financial support.  727-418-4651 ? [place] DSS Where to apply for food stamps, Medicaid and utility assistance. 873 556 2720 ? RCATS: Transportation to medical appointments. (252)714-8180 ? Social Security Administration: May apply for disability if have a Stage IV cancer. 817-071-6365 (604)265-8078 ? [employer], Disability and Transit Services: Assists with nutrition, care and transit needs. 903-649-8209

## 2017-11-27 ENCOUNTER — Encounter: Payer: Self-pay | Admitting: Nurse Practitioner

## 2017-11-27 ENCOUNTER — Ambulatory Visit: Payer: Medicare Other | Admitting: Nurse Practitioner

## 2017-11-27 VITALS — BP 95/57 | HR 92 | Temp 98.3°F | Ht 63.0 in | Wt 137.0 lb

## 2017-11-27 DIAGNOSIS — D631 Anemia in chronic kidney disease: Secondary | ICD-10-CM | POA: Diagnosis not present

## 2017-11-27 DIAGNOSIS — N183 Chronic kidney disease, stage 3 unspecified: Secondary | ICD-10-CM

## 2017-11-27 DIAGNOSIS — R5383 Other fatigue: Secondary | ICD-10-CM

## 2017-11-27 DIAGNOSIS — R195 Other fecal abnormalities: Secondary | ICD-10-CM | POA: Diagnosis not present

## 2017-11-27 NOTE — Progress Notes (Signed)
Referring Provider: Chevis Pretty, * Primary Care Physician:  Chevis Pretty, FNP Primary GI:  Dr. Oneida Alar  Chief Complaint  Patient presents with  . Anemia    HPI:   Janet Fletcher is a 82 y.o. female who presents for follow-up on anemia and GI bleed.  The patient was last seen in our office 10/01/2017 for the same.  Patient was referred by oncology due to small cell lung cancer and anemia with a low point hemoglobin of 8.9 on Aranesp and B12 injections.  Colonoscopy and EGD completed 12/06/2016 with colonoscopy showing no sign or explanation of anemia, EGD with moderate gastritis due to NSAID use/possible H. pylori status post biopsies.  Deemed normocytic anemia due to gastritis, chronic renal insufficiency, chronic disease.  Gastric biopsies were positive for H. pylori and she is treated with Pylera.  Recommend avoid NSAIDs, follow-up in 3 months.  At her last visit she had been doing well with a hemoglobin in the 10-11.5 range of the previous 6 months.  There is a sudden decline day before her last visit with a hemoglobin dropped to 8.5 which are again normocytic and normochromic.  Found normal iron, high saturation, high ferritin.  She was given Aranesp by oncology and CBC pending recheck.  Doing well overall at the last visit, energy generally okay with some weakness with prolonged walking.  No other GI symptoms.  Bone marrow biopsy scheduled for 10/03/2017.  Recommended continue current medications, call for any GI bleeding or worsening GI symptoms, follow-up in 4 weeks.  After her bone marrow biopsy the patient was diagnosed with myelodysplastic syndrome, high-grade.  Some testing results still pending.  Median survival in the absence of therapy of 1.6 years.  At her previous oncology visit in March 2019 she had a 2-week drop in hemoglobin from 8.5-6.5.  Most recent oncology visit 11/17/2017.  At that time she was feeling weak, tires easily.  Due to chemotherapy previously with  carboplatin and etoposide in 2017, they feel this is likely therapy related MDS.  She is completely lost response to erythropoiesis stimulating agents.  She was given a unit of blood at her last visit the globin of 7.  Recommended starting therapy with azacitidine 75 mg per M square, on a 5-day schedule every 28 days.  Today she states she's doing ok overall. She is fatigued. Accompanied by her daughters. Denies abdominal pain, N/V, hematochezia. Having weakness and has fallen twice, last time 5 days ago. Had some bruising, no other injuries, didn't hit her head. Has had "black bowel movements" about once a week, not on iron. Denies GERD symptoms. Denies NSAIDs, has used Tylenol after her fall. Denies ASA powders. Her appetite is pretty good. Has lost some weight. Her daughters state she "nibbles frequently." Taking 4 ensure-type supplements a day. Denies chest pain, dyspnea, dizziness, lightheadedness, syncope, near syncope. Denies any other upper or lower GI symptoms.  Past Medical History:  Diagnosis Date  . Allergy   . Cancer ([place])    lung  . Hyperlipidemia   . Iron deficiency anemia 12/15/2015  . Low vitamin B12 level 12/15/2015  . Osteopenia     Past Surgical History:  Procedure Laterality Date  . CATARACT EXTRACTION, BILATERAL Bilateral   . COLONOSCOPY N/A 12/06/2016   Procedure: COLONOSCOPY;  Surgeon: Danie Binder, MD;  Location: [facility];  Service: Endoscopy;  Laterality: N/A;  1:45pm  . ESOPHAGOGASTRODUODENOSCOPY N/A 12/06/2016   Procedure: ESOPHAGOGASTRODUODENOSCOPY (EGD);  Surgeon: Danie Binder, MD;  Location: [facility];  Service: Endoscopy;  Laterality: N/A;  . MINOR EXCISION EAR CANAL CYST Left    30+ years ago  . SMALL INTESTINE SURGERY    . VIDEO BRONCHOSCOPY WITH ENDOBRONCHIAL ULTRASOUND N/A 12/11/2015   Procedure: VIDEO BRONCHOSCOPY WITH ENDOBRONCHIAL ULTRASOUND;  Surgeon: Melrose Nakayama, MD;  Location: [facility];  Service: Thoracic;  Laterality: N/A;     Current Outpatient Medications  Medication Sig Dispense Refill  . cyanocobalamin (,VITAMIN B-12,) 1000 MCG/ML injection Inject 1 mL (1,000 mcg total) into the muscle every 30 (thirty) days. 3 mL 1  . donepezil (ARICEPT) 10 MG tablet Take 1 tablet (10 mg total) by mouth at bedtime. 90 tablet 1  . ondansetron (ZOFRAN) 8 MG tablet Take 1 tablet (8 mg total) by mouth 2 (two) times daily. Start on the third day from the last day of chemotherapy 20 tablet 1  . prochlorperazine (COMPAZINE) 10 MG tablet Take 1 tablet (10 mg total) by mouth every 6 (six) hours as needed for nausea or vomiting. 30 tablet 1  . azithromycin (ZITHROMAX Z-PAK) 250 MG tablet As directed (Patient not taking: Reported on 11/27/2017) 6 tablet 0   No current facility-administered medications for this visit.     Allergies as of 11/27/2017 - Review Complete 11/27/2017  Allergen Reaction Noted  . Flagyl [metronidazole hcl] Nausea Only 08/05/2011  . Penicillins Other (See Comments) 08/05/2011    Family History  Problem Relation Age of Onset  . Cancer Mother   . Hypertension Father   . Colon cancer Neg Hx     Social History   Socioeconomic History  . Marital status: Married    Spouse name: Not on file  . Number of children: Not on file  . Years of education: Not on file  . Highest education level: Not on file  Occupational History  . Not on file  Social Needs  . Financial resource strain: Not on file  . Food insecurity:    Worry: Not on file    Inability: Not on file  . Transportation needs:    Medical: Not on file    Non-medical: Not on file  Tobacco Use  . Smoking status: Former Smoker    Packs/day: 0.25    Years: 50.00    Pack years: 12.50    Last attempt to quit: 01/08/2016    Years since quitting: 1.8  . Smokeless tobacco: Never Used  Substance and Sexual Activity  . Alcohol use: No  . Drug use: No  . Sexual activity: Not Currently  Lifestyle  . Physical activity:    Days per week: Not on file     Minutes per session: Not on file  . Stress: Not on file  Relationships  . Social connections:    Talks on phone: Not on file    Gets together: Not on file    Attends religious service: Not on file    Active member of club or organization: Not on file    Attends meetings of clubs or organizations: Not on file    Relationship status: Not on file  Other Topics Concern  . Not on file  Social History Narrative  . Not on file    Review of Systems: Complete ROS negative except as per HPI.   Physical Exam: BP (!) 95/57   Pulse 92   Temp 98.3 F (36.8 C) (Oral)   Ht '5\' 3"'$  (1.6 m)   Wt 137 lb (62.1 kg)   BMI 24.27 kg/m  General:   Alert and oriented. Pleasant and cooperative. Well-nourished and well-developed. Appears weak/fatigued. Eyes:  Without icterus, sclera clear and conjunctiva pink.  Ears:  Normal auditory acuity. Cardiovascular:  S1, S2 present without murmurs appreciated. Extremities without clubbing or edema. Respiratory:  Clear to auscultation bilaterally. No wheezes, rales, or rhonchi. No distress.  Gastrointestinal:  +BS, soft, non-tender and non-distended. No HSM noted. No guarding or rebound. No masses appreciated.  Rectal:  Deferred  Musculoskalatal:  Symmetrical without gross deformities. Neurologic:  Alert and oriented x4;  grossly normal neurologically. Psych:  Alert and cooperative. Normal mood and affect. Heme/Lymph/Immune: No excessive bruising noted.    11/27/2017 9:10 AM   Disclaimer: This note was dictated with voice recognition software. Similar sounding words can inadvertently be transcribed and may not be corrected upon review.

## 2017-11-27 NOTE — Patient Instructions (Signed)
1. Continue to see hematology/oncology based on their recommendations. 2. If you see any blood in your stools or worsening black stools, call our office. 3. Call us if we can provide any support or assistance. 4. Follow-up in 6 months. 5. Call us if you have any questions or concerns  At [facility] we value your feedback. You may receive a survey about your visit today. Please share your experience as we strive to create trusting relationships with our patients to provide genuine, compassionate, quality care.  It was great to see you today!  I hope you have a good summer!!

## 2017-11-27 NOTE — Assessment & Plan Note (Signed)
Anemia likely multifactorial nature from chronic disease, myelodysplastic process, cancer process, some element of GI bleed.  She is undergoing treatment with oncology.  Her daughters are helping her at home.  She has been receiving blood transfusions from hematology/oncology.  Recommend she continue to follow-up with them.  Call us for any noted to GI bleed.  Return for follow-up in 6 months otherwise.  Call us if we can offer any assistance.

## 2017-11-27 NOTE — Assessment & Plan Note (Signed)
There is a question of possible heme positive stool.  The patient does have gastritis per EGD.  Colonoscopy normal.  The patient does have intermittent black stools about once a week.  This is brief and self resolves.  This is likely a very minor contributor to her overall anemia due to cancer and myelodysplastic syndrome currently on treatment.  Recommend she continue to see hematology/oncology.  Blood transfusions as necessary, as they have been giving.  Follow-up in 6 months with our office.

## 2017-11-27 NOTE — Assessment & Plan Note (Signed)
Generalized fatigue likely due to poor oral intake, neoplastic process, anemia due to MDS as well as chronic illness/minor GI bleeding.  Continues to see hematology/oncology.  I reinforced supplementation for energy and nutrition.  Continue to follow-up with oncology for treatments and blood/iron as needed/if needed.  Follow-up with our office in 6 months.  Call with any problems.

## 2017-11-28 ENCOUNTER — Ambulatory Visit (INDEPENDENT_AMBULATORY_CARE_PROVIDER_SITE_OTHER): Payer: Medicare Other | Admitting: Nurse Practitioner

## 2017-11-28 ENCOUNTER — Telehealth: Payer: Self-pay | Admitting: Nurse Practitioner

## 2017-11-28 ENCOUNTER — Encounter: Payer: Self-pay | Admitting: Nurse Practitioner

## 2017-11-28 VITALS — BP 86/55 | HR 93 | Temp 96.7°F | Ht 63.0 in | Wt 139.6 lb

## 2017-11-28 DIAGNOSIS — R413 Other amnesia: Secondary | ICD-10-CM | POA: Diagnosis not present

## 2017-11-28 MED ORDER — DONEPEZIL HCL 10 MG PO TABS: 10.0000 mg | ORAL_TABLET | Freq: Every day | ORAL | 1 refills | Status: DC

## 2017-11-28 MED ORDER — MEMANTINE HCL 5 MG PO TABS: 5.0000 mg | ORAL_TABLET | Freq: Two times a day (BID) | ORAL | 5 refills | Status: DC

## 2017-11-28 NOTE — Patient Instructions (Signed)
Dementia Dementia is the loss of two or more brain functions, such as:  Memory.  Decision making.  Behavior.  Speaking.  Thinking.  Problem solving.  There are many types of dementia. The most common type is called progressive dementia. Progressive dementia gets worse with time and it is irreversible. An example of this type of dementia is Alzheimer disease. What are the causes? This condition may be caused by:  Nerve cell damage in the brain.  Genetic mutations.  Certain medicines.  Multiple small strokes.  An infection, such as chronic meningitis.  A metabolic problem, such as vitamin B12 deficiency or thyroid disease.  Pressure on the brain, such as from a tumor or blood clot.  What are the signs or symptoms? Symptoms of this condition include:  Sudden changes in mood.  Depression.  Problems with balance.  Changes in personality.  Poor short-term memory.  Agitation.  Delusions.  Hallucinations.  Having a hard time: ? Speaking thoughts. ? Finding words. ? Solving problems. ? Doing familiar tasks. ? Understanding familiar ideas.  How is this diagnosed? This condition is diagnosed with an assessment by your health care provider. During this assessment, your health care provider will talk with you and your family, friends, or caregivers about your symptoms. A thorough medical history will be taken, and you will have a physical exam and tests. Tests may include:  Lab tests, such as blood or urine tests.  Imaging tests, such as a CT scan, PET scan, or MRI.  A lumbar puncture. This test involves removing and testing a small amount of the fluid that surrounds the brain and spinal cord.  An electroencephalogram (EEG). In this test, small metal discs are used to measure electrical activity in the brain.  Memory tests, cognitive tests, and neuropsychological tests. These tests evaluate brain function.  How is this treated? Treatment depends on the  cause of the dementia. It may involve taking medicines that may help:  To control the dementia.  To slow down the disease.  To manage symptoms.  In some cases, treating the cause of the dementia can improve symptoms, reverse symptoms, or slow down how quickly the dementia gets worse. Your health care provider can help direct you to support groups, organizations, and other health care providers who can help with decisions about your care. Follow these instructions at home: Medicine  Take over-the-counter and prescription medicines only as told by your health care provider.  Avoid taking medicines that can affect thinking, such as pain or sleeping medicines. Lifestyle   Make healthy lifestyle choices: ? Be physically active as told by your health care provider. ? Do not use any tobacco products, such as cigarettes, chewing tobacco, and e-cigarettes. If you need help quitting, ask your health care provider. ? Eat a healthy diet. ? Practice stress-management techniques when you get stressed. ? Stay social.  Drink enough fluid to keep your urine clear or pale yellow.  Make sure to get quality sleep. These tips can help you to get a good night's rest: ? Avoid napping during the day. ? Keep your sleeping area dark and cool. ? Avoid exercising during the few hours before you go to bed. ? Avoid caffeine products in the evening. General instructions  Work with your health care provider to determine what you need help with and what your safety needs are.  If you were given a bracelet that tracks your location, make sure to wear it.  Keep all follow-up visits as told by your   health care provider. This is important. Contact a health care provider if:  You have any new symptoms.  You have problems with choking or swallowing.  You have any symptoms of a different illness. Get help right away if:  You develop a fever.  You have new or worsening confusion.  You have new or  worsening sleepiness.  You have a hard time staying awake.  You or your family members become concerned for your safety. This information is not intended to replace advice given to you by your health care provider. Make sure you discuss any questions you have with your health care provider. Document Released: 01/01/2001 Document Revised: 11/16/2015 Document Reviewed: 04/05/2015 Elsevier Interactive Patient Education  2018 [employer]  

## 2017-11-28 NOTE — Progress Notes (Signed)
   Subjective:    Patient ID: Janet Fletcher, female    DOB: 05-17-1936, 82 y.o.   MRN: [id number]   Chief Complaint: memory changes   HPI Patient is brought in by her daughters today stating that patient seems to be having some memory disturbances. She is still getting chemo for lung cancer. Her daughters said that the memory problems started before the chemo. She is currently on aricept 10mg  daily and that helped when she first started taking. MMSE - Mini Mental State Exam 11/28/2017 03/05/2017  Orientation to time 1 4  Orientation to Place 3 5  Registration 3 3  Attention/ Calculation 0 0  Recall 0 0  Language- name 2 objects 2 2  Language- repeat 0 1  Language- follow 3 step command 3 3  Language- read & follow direction 1 1  Write a sentence 0 1  Copy design 0 0  Total score 13 20       Review of Systems  Constitutional: Negative.   HENT: Negative.   Respiratory: Positive for shortness of breath.   Cardiovascular: Negative for chest pain, palpitations and leg swelling.  Gastrointestinal: Positive for nausea (from chemo).  Neurological: Negative for headaches.  Psychiatric/Behavioral: Positive for confusion and decreased concentration.  All other systems reviewed and are negative.      Objective:   Physical Exam  Constitutional: She is oriented to person, place, and time. She appears well-developed and well-nourished.  Cardiovascular: Normal rate.  Pulmonary/Chest: Effort normal.  Neurological: She is alert and oriented to person, place, and time.  Skin: Skin is warm and dry.  Psychiatric: She has a normal mood and affect. Her behavior is normal. Judgment and thought content normal.    BP (!) 86/55   Pulse 93   Temp (!) 96.7 F (35.9 C) (Oral)   Ht 5\' 3"  (1.6 m)   Wt 139 lb 9.6 oz (63.3 kg)   BMI 24.73 kg/m        Assessment & Plan:  1. Memory deficit Continue to orient daily Continue aricept as rx Follow up prn and for regular follow up -  memantine (NAMENDA) 5 MG tablet; Take 1 tablet (5 mg total) by mouth 2 (two) times daily.  Dispense: 60 tablet; Refill: [place], FNP

## 2017-12-01 ENCOUNTER — Telehealth: Payer: Self-pay | Admitting: *Deleted

## 2017-12-01 DIAGNOSIS — R413 Other amnesia: Secondary | ICD-10-CM

## 2017-12-01 MED ORDER — DONEPEZIL HCL 10 MG PO TABS: 10.0000 mg | ORAL_TABLET | Freq: Every day | ORAL | 0 refills | Status: DC

## 2017-12-01 NOTE — Telephone Encounter (Signed)
Pt here at office & aware sent to pharmacy

## 2017-12-02 NOTE — Progress Notes (Signed)
[place], [place] [address]   CLINIC:  Medical Oncology/Hematology  PCP:  Chevis Pretty, [place] [address] [place] [address] [phone]   REASON FOR VISIT:  Follow-up for MDS.  CURRENT THERAPY: Azacitidine   BRIEF ONCOLOGIC HISTORY:    Small cell carcinoma of right lung ([facility])   11/28/2015 Imaging    Large anterior mediastinal mass in continuity with a R hilar mass, narrowing of SVC, abnormal densities in RUL, 9 mm spiculated nodule, nonspec. hypodensity in liver      12/05/2015 PET scan    Large hypermetabolic paratracheal mass c/w SCLC, perihilar nodular densities in RML, mild metabolic activity RUL nodule, no distant metastatic disease      12/11/2015 Procedure    Video bronch with biopsies and brushings, endobronchial ultrasound with mediastinal LN aspiration. Dr. Roxan Hockey      12/11/2015 Pathology Results    Trachea biopsy negative, FNA RUL malignant cells c/w SCLC      12/13/2015 - 03/15/2016 Chemotherapy    Cisplatin/Etoposide x 5 cycles.  Cycle #6 cancelled due to significant cytopenias      12/25/2015 Imaging    MRI brain- No intracranial parenchymal enhancing lesion. Tiny right frontal calvarial enhancing lesion. Small metastatic lesion not excluded although this may represent an incidentally detected benign process.      12/29/2015 - 02/13/2016 Radiation Therapy    XRT      02/14/2016 Treatment Plan Change    Chemotherapy deferred x 1 week      02/21/2016 Treatment Plan Change    Carboplatin dose reduced by 20% and Etoposide dose reduced by 10%.      04/24/2016 Imaging    CT CAP- Chest Impression:  1. Marked reduction in mediastinal and RIGHT hilar lymphadenopathy with no residual pathologically enlarged nodes. 2. Resolution of perihilar nodularity in the RIGHT lung which was hypermetabolic on FDG PET. 3. Persistent nodule in the RIGHT upper lobe which is not hypermetabolic. 4. New ground-glass  nodule in the RIGHT upper lobe is nonspecific and may relate to therapy.  Abdomen / Pelvis Impression:  1. No evidence of metastatic disease in the abdomen pelvis. 2. Atherosclerotic calcification of the aorta.      04/24/2016 Imaging    Bone scan- Nonspecific increased tracer localization at approximately T11 vertebral body; this is of uncertain etiology, could represent a degenerative process though metastatic disease is not excluded.      04/29/2016 -  Radiation Therapy    PCI -- 25 Gy      07/24/2016 Imaging    CT CAP- 1. There has been interval decrease in size of mediastinal lymph nodes. The index lesion within the right upper lobe is also decreased in size from previous exam. 2. New subpleural nodule is noted within the posteromedial right lower lobe. Attention on follow-up imaging advised. 3. Stable ground-glass attenuating nodule within the right lower lobe and anteromedial left upper lobe 4. Aortic atherosclerosis and coronary artery calcification      07/29/2016 Imaging    MRI t-spine: 1. Stable benign slightly enhancing sclerotic lesion in the right posterolateral aspect of the T11 vertebral body, unchanged for 5 years. This is felt to account for the subtle increased activity at T11 on the SPECT bone scan. 2. Slight degenerative disc disease with small disc protrusions at T1-2 and T2-3 and T12-L1 with no neural impingement. 3. No evidence of metastatic disease.      09/23/2016 Imaging    CT chest- Radiation  changes in the central right upper lobe/ perihilar region.  Stable small mediastinal lymph nodes, unchanged.  No findings suspicious for new/progressive metastatic disease.      12/25/2016 Imaging    CT C/A/P: IMPRESSION: 1. Continued further decrease in mediastinal lymph nodes. 2. Index nodule antero medial right upper lobe has decreased. The new 5 mm right lower lobe subpleural nodule seen on the previous study has resolved in the interval. Ground-glass  nodules in the upper lobes bilaterally are stable. 3. Interval development of interstitial and alveolar opacity in the parahilar right lung is presumably radiation related. Attention on follow-up recommended. 4. New tiny right pleural effusion with areas of apparent pleural enhancement in the posterior right costophrenic sulcus. Close attention on follow-up recommended. 5. Marked abdominal aortic atherosclerosis with likely infrarenal significant stenosis.       07/02/2017 Imaging    CT Chest w/ contrast: Resolution of central right lower lobe infiltrate and right pleural effusion since prior study.  Stable ground-glass opacities in both upper lobes.  No new or progressive disease      11/16/2017 -  Chemotherapy    The patient had palonosetron (ALOXI) injection 0.25 mg, 0.25 mg, Intravenous,  Once, 1 of 4 cycles Administration: 0.25 mg (11/17/2017), 0.25 mg (11/19/2017), 0.25 mg (11/21/2017) azaCITIDine (VIDAZA) 130 mg in sodium chloride 0.9 % 50 mL chemo infusion, 75 mg/m2 = 130 mg, Intravenous, Once, 1 of 4 cycles Administration: 130 mg (11/17/2017), 130 mg (11/18/2017), 130 mg (11/19/2017), 130 mg (11/20/2017), 130 mg (11/21/2017)  for chemotherapy treatment.        MDS (myelodysplastic syndrome), high grade ([facility])   10/20/2017 Initial Diagnosis    MDS (myelodysplastic syndrome), high grade ([facility])      11/16/2017 -  Chemotherapy    The patient had palonosetron (ALOXI) injection 0.25 mg, 0.25 mg, Intravenous,  Once, 1 of 4 cycles Administration: 0.25 mg (11/17/2017), 0.25 mg (11/19/2017), 0.25 mg (11/21/2017) azaCITIDine (VIDAZA) 130 mg in sodium chloride 0.9 % 50 mL chemo infusion, 75 mg/m2 = 130 mg, Intravenous, Once, 1 of 4 cycles Administration: 130 mg (11/17/2017), 130 mg (11/18/2017), 130 mg (11/19/2017), 130 mg (11/20/2017), 130 mg (11/21/2017)  for chemotherapy treatment.          INTERVAL HISTORY:  Janet Fletcher 82 y.o. female returns for follow-up of MDS.    Treatment started with  Vidaza on 11/17/17.  Hgb was 7 g/dL at that time and she received 1 unit PRBCs at that time as well.   Critical lab values for today noted: Hgb 6 g/dL. WBCs 1.3. Other labs reviewed: Plts 56,000, ANC 800.   Here today with her daughter.   Reports feeling weak and tired. Her legs are weak. She has been constipated since starting Vidaza; her daughter reports that she started her on stool softeners, which is taking daily. Reports that Ms. Lenhard was diaphoretic on the toilet.  She also has had a fall since her last visit; her tailbone is still sore. Denies N&V. She did not take any nausea medications at home.  Her constipation is better.  She feels light-headed when standing.    Her daughter reports that her BP was low at the PCP's office 80s/40s.  BP stable today. Reviewed falls precautions with her and her daughter today.    She has itching on her right arm, with a rash; it has been there for about 1 month. Encouraged her to try hydrocortisone cream as well as Benadryl cream; this is not likely secondary to her chemo.  REVIEW OF SYSTEMS:  Review of Systems  Constitutional: Positive for fatigue.  Gastrointestinal: Positive for constipation.  Musculoskeletal:       Lower extremity weakness.  Skin: Positive for itching.       Itching in the right forearm.  Neurological: Positive for dizziness.  All other systems reviewed and are negative.    PAST MEDICAL/SURGICAL HISTORY:  Past Medical History:  Diagnosis Date  . Allergy   . Cancer ([facility])    lung  . Hyperlipidemia   . Iron deficiency anemia 12/15/2015  . Low vitamin B12 level 12/15/2015  . Osteopenia    Past Surgical History:  Procedure Laterality Date  . CATARACT EXTRACTION, BILATERAL Bilateral   . COLONOSCOPY N/A 12/06/2016   Procedure: COLONOSCOPY;  Surgeon: Danie Binder, MD;  Location: [facility];  Service: Endoscopy;  Laterality: N/A;  1:45pm  . ESOPHAGOGASTRODUODENOSCOPY N/A 12/06/2016   Procedure:  ESOPHAGOGASTRODUODENOSCOPY (EGD);  Surgeon: Danie Binder, MD;  Location: [facility];  Service: Endoscopy;  Laterality: N/A;  . MINOR EXCISION EAR CANAL CYST Left    30+ years ago  . SMALL INTESTINE SURGERY    . VIDEO BRONCHOSCOPY WITH ENDOBRONCHIAL ULTRASOUND N/A 12/11/2015   Procedure: VIDEO BRONCHOSCOPY WITH ENDOBRONCHIAL ULTRASOUND;  Surgeon: Melrose Nakayama, MD;  Location: [facility];  Service: Thoracic;  Laterality: N/A;     SOCIAL HISTORY:  Social History   Socioeconomic History  . Marital status: Married    Spouse name: Not on file  . Number of children: Not on file  . Years of education: Not on file  . Highest education level: Not on file  Occupational History  . Not on file  Social Needs  . Financial resource strain: Not on file  . Food insecurity:    Worry: Not on file    Inability: Not on file  . Transportation needs:    Medical: Not on file    Non-medical: Not on file  Tobacco Use  . Smoking status: Former Smoker    Packs/day: 0.25    Years: 50.00    Pack years: 12.50    Last attempt to quit: 01/08/2016    Years since quitting: 1.9  . Smokeless tobacco: Never Used  Substance and Sexual Activity  . Alcohol use: No  . Drug use: No  . Sexual activity: Not Currently  Lifestyle  . Physical activity:    Days per week: Not on file    Minutes per session: Not on file  . Stress: Not on file  Relationships  . Social connections:    Talks on phone: Not on file    Gets together: Not on file    Attends religious service: Not on file    Active member of club or organization: Not on file    Attends meetings of clubs or organizations: Not on file    Relationship status: Not on file  . Intimate partner violence:    Fear of current or ex partner: Not on file    Emotionally abused: Not on file    Physically abused: Not on file    Forced sexual activity: Not on file  Other Topics Concern  . Not on file  Social History Narrative  . Not on file    FAMILY  HISTORY:  Family History  Problem Relation Age of Onset  . Cancer Mother   . Hypertension Father   . Colon cancer Neg Hx     CURRENT MEDICATIONS:  Outpatient Encounter Medications as of 12/03/2017  Medication Sig  . cyanocobalamin (,VITAMIN B-12,) 1000 MCG/ML injection Inject 1 mL (1,000 mcg total) into the muscle every 30 (thirty) days.  Marland Kitchen donepezil (ARICEPT) 10 MG tablet Take 1 tablet (10 mg total) by mouth at bedtime.  . memantine (NAMENDA) 5 MG tablet Take 1 tablet (5 mg total) by mouth 2 (two) times daily.  . ondansetron (ZOFRAN) 8 MG tablet Take 1 tablet (8 mg total) by mouth 2 (two) times daily. Start on the third day from the last day of chemotherapy  . prochlorperazine (COMPAZINE) 10 MG tablet Take 1 tablet (10 mg total) by mouth every 6 (six) hours as needed for nausea or vomiting.   No facility-administered encounter medications on file as of 12/03/2017.     ALLERGIES:  Allergies  Allergen Reactions  . Flagyl [Metronidazole Hcl] Nausea Only  . Penicillins Other (See Comments)    Has patient had a PCN reaction causing immediate rash, facial/tongue/throat swelling, SOB or lightheadedness with hypotension: unknown Has patient had a PCN reaction causing severe rash involving mucus membranes or skin necrosis: unknown Has patient had a PCN reaction that required hospitalization unknown Has patient had a PCN reaction occurring within the last 10 years: unknown  If all of the above answers are "NO", then may proceed with Cephalosporin use.     PHYSICAL EXAM:  ECOG Performance status: 1  Vitals:   12/03/17 0934  BP: (!) 103/52  Pulse: 84  Resp: 16   There were no vitals filed for this visit.  Physical Exam Deferred.  LABORATORY DATA:  I have reviewed the labs as listed.  CBC    Component Value Date/Time   WBC 1.3 (LL) 12/03/2017 0817   RBC 2.03 (L) 12/03/2017 0817   HGB 6.0 (LL) 12/03/2017 0817   HCT 18.3 (L) 12/03/2017 0817   PLT 56 (L) 12/03/2017 0817   MCV  90.1 12/03/2017 0817   MCV 71.9 (A) 08/20/2013 1559   MCH 29.6 12/03/2017 0817   MCHC 32.8 12/03/2017 0817   RDW 14.7 12/03/2017 0817   LYMPHSABS 0.3 (L) 12/03/2017 0817   MONOABS 0.1 12/03/2017 0817   EOSABS 0.1 12/03/2017 0817   BASOSABS 0.0 12/03/2017 0817   CMP Latest Ref Rng & Units 12/03/2017 11/17/2017 10/27/2017  Glucose 65 - 99 mg/dL 116(H) 120(H) 91  BUN 6 - 20 mg/dL 29(H) 33(H) 24(H)  Creatinine 0.44 - 1.00 mg/dL 1.29(H) 1.34(H) 1.26(H)  Sodium 135 - 145 mmol/L 139 141 137  Potassium 3.5 - 5.1 mmol/L 3.9 4.0 3.8  Chloride 101 - 111 mmol/L 105 108 102  CO2 22 - 32 mmol/L 28 25 27   Calcium 8.9 - 10.3 mg/dL 8.7(L) 8.9 8.6(L)  Total Protein 6.5 - 8.1 g/dL 6.5 6.2(L) 6.4(L)  Total Bilirubin 0.3 - 1.2 mg/dL 1.2 1.1 1.1  Alkaline Phos 38 - 126 U/L 57 62 63  AST 15 - 41 U/L 24 22 23   ALT 14 - 54 U/L 14 12(L) 13(L)            ASSESSMENT & PLAN:   MDS (myelodysplastic syndrome), high grade ([facility]) 1.  MDS, high risk by IPSS-R: Bone marrow aspiration and biopsy done on 10/03/2017 shows erythroid hyperplasia, increased in erythroid precursors with atypia (less than 10%) in the form of nuclear irregularities, nuclear budding and multi nucleation.  Megakaryocytes are increased with frequent small, hyperlobulated and hyperchromatic forms.  Granulocytic precursors have no significant dysplasia. -Chromosome analysis shows 37, XX, del(3)(q21q29),add(7)(p15),del(13)(q12q14)[6]/46,XX[14] -FISH results show 5 q-, loss of chromosome 7, and trisomy  8. -She received a total of 5 points based on revised IPSS system, with a median survival in the absence of therapy of 1.6 years, 25% AML progression in the absence of therapy and 1.4 years.  She has received chemotherapy with carboplatin and etoposide in 2017.  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  She has been receiving Aranesp for more than a year, dose increased to 200 mcg every 2 weeks in December 2019.  We have given Aranesp at last  visit a week ago.  She completely lost response to erythropoiesis stimulating agents. - Azacitidine 75 mg/m x 5 days every 28 days cycle 1 started on 11/17/2017 - Received 1 unit of blood transfusion for hemoglobin of 7 on 11/17/2017, today hemoglobin is down to 6.  Nadir ANC is 800 with a white count of 1.3.  She is feeling weak.  We will transfuse 2 units of blood today.  She will be seen back in 2 weeks for her cycle 2.  We will make addition on dose reduction based on counts at that time.  2.  Weight loss: She lost about 3 pounds in the last 2 weeks.  She is drinking Ensure and eating 3 meals per day.   3.  Small cell lung cancer: She has completed chemoradiation therapy around July 2017.  Her last CT scan was in December 2018.  We will plan to repeat CT scan soon.      Orders placed this encounter:  No orders of the defined types were placed in this encounter.    This note includes documentation from Mike Craze, NP, who was present during this patient's office visit and evaluation.  I have reviewed this note for its completeness and accuracy.  I have edited this note accordingly based on my findings and medical opinion.      Derek Jack, MD [place] [phone]

## 2017-12-03 ENCOUNTER — Inpatient Hospital Stay (HOSPITAL_COMMUNITY): Payer: Medicare Other

## 2017-12-03 ENCOUNTER — Other Ambulatory Visit: Payer: Self-pay

## 2017-12-03 ENCOUNTER — Encounter (HOSPITAL_COMMUNITY): Payer: Self-pay | Admitting: Hematology

## 2017-12-03 ENCOUNTER — Inpatient Hospital Stay (HOSPITAL_BASED_OUTPATIENT_CLINIC_OR_DEPARTMENT_OTHER): Payer: Medicare Other | Admitting: Hematology

## 2017-12-03 VITALS — BP 103/52 | HR 84 | Resp 16

## 2017-12-03 DIAGNOSIS — K5900 Constipation, unspecified: Secondary | ICD-10-CM

## 2017-12-03 DIAGNOSIS — D46Z Other myelodysplastic syndromes: Secondary | ICD-10-CM | POA: Diagnosis not present

## 2017-12-03 DIAGNOSIS — R634 Abnormal weight loss: Secondary | ICD-10-CM

## 2017-12-03 DIAGNOSIS — Z9181 History of falling: Secondary | ICD-10-CM | POA: Diagnosis not present

## 2017-12-03 DIAGNOSIS — Z5111 Encounter for antineoplastic chemotherapy: Secondary | ICD-10-CM | POA: Diagnosis not present

## 2017-12-03 DIAGNOSIS — C3491 Malignant neoplasm of unspecified part of right bronchus or lung: Secondary | ICD-10-CM | POA: Diagnosis not present

## 2017-12-03 DIAGNOSIS — D500 Iron deficiency anemia secondary to blood loss (chronic): Secondary | ICD-10-CM

## 2017-12-03 DIAGNOSIS — D649 Anemia, unspecified: Secondary | ICD-10-CM

## 2017-12-03 DIAGNOSIS — R21 Rash and other nonspecific skin eruption: Secondary | ICD-10-CM

## 2017-12-03 DIAGNOSIS — D709 Neutropenia, unspecified: Secondary | ICD-10-CM

## 2017-12-03 LAB — CBC WITH DIFFERENTIAL/PLATELET
BASOS ABS: 0 10*3/uL (ref 0.0–0.1)
BASOS PCT: 1 %
Eosinophils Absolute: 0.1 10*3/uL (ref 0.0–0.7)
Eosinophils Relative: 4 %
HCT: 18.3 % — ABNORMAL LOW (ref 36.0–46.0)
HEMOGLOBIN: 6 g/dL — AB (ref 12.0–15.0)
Lymphocytes Relative: 27 %
Lymphs Abs: 0.3 10*3/uL — ABNORMAL LOW (ref 0.7–4.0)
MCH: 29.6 pg (ref 26.0–34.0)
MCHC: 32.8 g/dL (ref 30.0–36.0)
MCV: 90.1 fL (ref 78.0–100.0)
Monocytes Absolute: 0.1 10*3/uL (ref 0.1–1.0)
Monocytes Relative: 7 %
NEUTROS ABS: 0.8 10*3/uL — AB (ref 1.7–7.7)
NEUTROS PCT: 61 %
Platelets: 56 10*3/uL — ABNORMAL LOW (ref 150–400)
RBC: 2.03 MIL/uL — AB (ref 3.87–5.11)
RDW: 14.7 % (ref 11.5–15.5)
WBC: 1.3 10*3/uL — AB (ref 4.0–10.5)

## 2017-12-03 LAB — COMPREHENSIVE METABOLIC PANEL
ALBUMIN: 3.3 g/dL — AB (ref 3.5–5.0)
ALK PHOS: 57 U/L (ref 38–126)
ALT: 14 U/L (ref 14–54)
ANION GAP: 6 (ref 5–15)
AST: 24 U/L (ref 15–41)
BILIRUBIN TOTAL: 1.2 mg/dL (ref 0.3–1.2)
BUN: 29 mg/dL — ABNORMAL HIGH (ref 6–20)
CHLORIDE: 105 mmol/L (ref 101–111)
CO2: 28 mmol/L (ref 22–32)
CREATININE: 1.29 mg/dL — AB (ref 0.44–1.00)
Calcium: 8.7 mg/dL — ABNORMAL LOW (ref 8.9–10.3)
GFR calc Af Amer: 44 mL/min — ABNORMAL LOW (ref >60)
GFR calc non Af Amer: 38 mL/min — ABNORMAL LOW (ref >60)
GLUCOSE: 116 mg/dL — AB (ref 65–99)
Potassium: 3.9 mmol/L (ref 3.5–5.1)
SODIUM: 139 mmol/L (ref 135–145)
TOTAL PROTEIN: 6.5 g/dL (ref 6.5–8.1)

## 2017-12-03 LAB — SAMPLE TO BLOOD BANK

## 2017-12-03 LAB — LACTATE DEHYDROGENASE: LDH: 169 U/L (ref 98–192)

## 2017-12-03 LAB — PREPARE RBC (CROSSMATCH)

## 2017-12-03 MED ORDER — HEPARIN SOD (PORK) LOCK FLUSH 100 UNIT/ML IV SOLN
500.0000 [IU] | Freq: Every day | INTRAVENOUS | Status: AC | PRN
  Administered 2017-12-03: 500 [IU]
  Filled 2017-12-03: qty 5

## 2017-12-03 MED ORDER — SODIUM CHLORIDE 0.9% FLUSH: 3.0000 mL | INTRAVENOUS | Status: DC | PRN

## 2017-12-03 MED ORDER — SODIUM CHLORIDE 0.9% FLUSH
10.0000 mL | INTRAVENOUS | Status: AC | PRN
  Administered 2017-12-03: 10 mL

## 2017-12-03 MED ORDER — DIPHENHYDRAMINE HCL 25 MG PO CAPS
25.0000 mg | ORAL_CAPSULE | Freq: Once | ORAL | Status: AC
  Administered 2017-12-03: 25 mg via ORAL
  Filled 2017-12-03: qty 1

## 2017-12-03 MED ORDER — ACETAMINOPHEN 325 MG PO TABS
650.0000 mg | ORAL_TABLET | Freq: Once | ORAL | Status: AC
  Administered 2017-12-03: 650 mg via ORAL
  Filled 2017-12-03: qty 2

## 2017-12-03 MED ORDER — SODIUM CHLORIDE 0.9 % IV SOLN
250.0000 mL | Freq: Once | INTRAVENOUS | Status: AC
  Administered 2017-12-03: 250 mL via INTRAVENOUS

## 2017-12-03 MED ORDER — HEPARIN SOD (PORK) LOCK FLUSH 100 UNIT/ML IV SOLN
INTRAVENOUS | Status: AC
  Filled 2017-12-03: qty 5

## 2017-12-03 NOTE — Assessment & Plan Note (Signed)
1.  MDS, high risk by IPSS-R: Bone marrow aspiration and biopsy done on 10/03/2017 shows erythroid hyperplasia, increased in erythroid precursors with atypia (less than 10%) in the form of nuclear irregularities, nuclear budding and multi nucleation.  Megakaryocytes are increased with frequent small, hyperlobulated and hyperchromatic forms.  Granulocytic precursors have no significant dysplasia. -Chromosome analysis shows 74, XX, del(3)(q21q29),add(7)(p15),del(13)(q12q14)[6]/46,XX[14] -FISH results show 5 q-, loss of chromosome 7, and trisomy 8. -She received a total of 5 points based on revised IPSS system, with a median survival in the absence of therapy of 1.6 years, 25% AML progression in the absence of therapy and 1.4 years.  She has received chemotherapy with carboplatin and etoposide in 2017.  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  She has been receiving Aranesp for more than a year, dose increased to 200 mcg every 2 weeks in December 2019.  We have given Aranesp at last visit a week ago.  She completely lost response to erythropoiesis stimulating agents. - Azacitidine 75 mg/m x 5 days every 28 days cycle 1 started on 11/17/2017 - Received 1 unit of blood transfusion for hemoglobin of 7 on 11/17/2017, today hemoglobin is down to 6.  Nadir ANC is 800 with a white count of 1.3.  She is feeling weak.  We will transfuse 2 units of blood today.  She will be seen back in 2 weeks for her cycle 2.  We will make addition on dose reduction based on counts at that time.  2.  Weight loss: She lost about 3 pounds in the last 2 weeks.  She is drinking Ensure and eating 3 meals per day.   3.  Small cell lung cancer: She has completed chemoradiation therapy around July 2017.  Her last CT scan was in December 2018.  We will plan to repeat CT scan soon.

## 2017-12-03 NOTE — Progress Notes (Signed)
CRITICAL VALUE ALERT Critical value received:  Hemoglobin 6, WBC 1.26 Date of notification:  12/03/2017 Time of notification: 8453 Critical value read back:  Yes.   Nurse who received alert:  Joanne Gavel RN MD notified (1st page):  Dr Raliegh Ip and Mike Craze NP

## 2017-12-03 NOTE — Patient Instructions (Signed)
[place] at [facility]  Discharge Instructions:  You received a blood transfusion today.   Blood Transfusion, Care After This sheet gives you information about how to care for yourself after your procedure. Your doctor may also give you more specific instructions. If you have problems or questions, contact your doctor. Follow these instructions at home:  Take over-the-counter and prescription medicines only as told by your doctor.  Go back to your normal activities as told by your doctor.  Follow instructions from your doctor about how to take care of the area where an IV tube was put into your vein (insertion site). Make sure you: ? Wash your hands with soap and water before you change your bandage (dressing). If there is no soap and water, use hand sanitizer. ? Change your bandage as told by your doctor.  Check your IV insertion site every day for signs of infection. Check for: ? More redness, swelling, or pain. ? More fluid or blood. ? Warmth. ? Pus or a bad smell. Contact a doctor if:  You have more redness, swelling, or pain around the IV insertion site..  You have more fluid or blood coming from the IV insertion site.  Your IV insertion site feels warm to the touch.  You have pus or a bad smell coming from the IV insertion site.  Your pee (urine) turns pink, red, or brown.  You feel weak after doing your normal activities. Get help right away if:  You have signs of a serious allergic or body defense (immune) system reaction, including: ? Itchiness. ? Hives. ? Trouble breathing. ? Anxiety. ? Pain in your chest or lower back. ? Fever, flushing, and chills. ? Fast pulse. ? Rash. ? Watery poop (diarrhea). ? Throwing up (vomiting). ? Dark pee. ? Serious headache. ? Dizziness. ? Stiff neck. ? Yellow color in your face or the white parts of your eyes (jaundice). Summary  After a blood transfusion, return to your normal activities as  told by your doctor.  Every day, check for signs of infection where the IV tube was put into your vein.  Some signs of infection are warm skin, more redness and pain, more fluid or blood, and pus or a bad smell where the needle went in.  Contact your doctor if you feel weak or have any unusual symptoms. This information is not intended to replace advice given to you by your health care provider. Make sure you discuss any questions you have with your health care provider. Document Released: 07/29/2014 Document Revised: 03/01/2016 Document Reviewed: 03/01/2016 Elsevier Interactive Patient Education  2017 [employer].  _______________________________________________________________  Thank you for choosing [place] at [facility] to provide your oncology and hematology care.  To afford each patient quality time with our providers, please arrive at least 15 minutes before your scheduled appointment.  You need to re-schedule your appointment if you arrive 10 or more minutes late.  We strive to give you quality time with our providers, and arriving late affects you and other patients whose appointments are after yours.  Also, if you no show three or more times for appointments you may be dismissed from the clinic.  Again, thank you for choosing [place] at [facility] hope is that these requests will allow you access to exceptional care and in a timely manner. _______________________________________________________________  If you have questions after your visit, please contact our office at [phone] between  the hours of 8:30 a.m. and 5:00 p.m. Voicemails left after 4:30 p.m. will not be returned until the following business day. _______________________________________________________________  For prescription refill requests, have your pharmacy contact our  office. _______________________________________________________________  Recommendations made by the consultant and any test results will be sent to your referring physician. _______________________________________________________________

## 2017-12-03 NOTE — Progress Notes (Signed)
Patient tolerated blood transfusions with no complaints voiced.  Port site clean and dry with no bruising or swelling noted at site.  Blood return noted before and after administration of blood.  Band aid applied.  VSS with discharge and left via wheelchair with daughter.  No s/s of distress noted.  Reviewed s/s of a blood transfusion reaction with the patient and family with understanding verbalized.

## 2017-12-04 LAB — BPAM RBC
BLOOD PRODUCT EXPIRATION DATE: 201905282359
Blood Product Expiration Date: 201906022359
ISSUE DATE / TIME: 201905151055
ISSUE DATE / TIME: 201905151238
Unit Type and Rh: 6200
Unit Type and Rh: 6200

## 2017-12-04 LAB — TYPE AND SCREEN
ABO/RH(D): A POS
Antibody Screen: NEGATIVE
Unit division: 0
Unit division: 0

## 2017-12-11 ENCOUNTER — Other Ambulatory Visit (HOSPITAL_COMMUNITY): Payer: Self-pay

## 2017-12-11 DIAGNOSIS — D469 Myelodysplastic syndrome, unspecified: Secondary | ICD-10-CM

## 2017-12-11 DIAGNOSIS — D649 Anemia, unspecified: Secondary | ICD-10-CM

## 2017-12-11 DIAGNOSIS — D709 Neutropenia, unspecified: Secondary | ICD-10-CM

## 2017-12-11 DIAGNOSIS — D46Z Other myelodysplastic syndromes: Secondary | ICD-10-CM

## 2017-12-11 DIAGNOSIS — Z9289 Personal history of other medical treatment: Secondary | ICD-10-CM

## 2017-12-14 ENCOUNTER — Encounter (HOSPITAL_COMMUNITY): Payer: Self-pay | Admitting: Hematology

## 2017-12-16 ENCOUNTER — Inpatient Hospital Stay (HOSPITAL_COMMUNITY): Payer: Medicare Other

## 2017-12-16 ENCOUNTER — Encounter (HOSPITAL_COMMUNITY): Payer: Self-pay | Admitting: Hematology

## 2017-12-16 ENCOUNTER — Inpatient Hospital Stay (HOSPITAL_BASED_OUTPATIENT_CLINIC_OR_DEPARTMENT_OTHER): Payer: Medicare Other | Admitting: Hematology

## 2017-12-16 ENCOUNTER — Encounter (HOSPITAL_COMMUNITY): Payer: Self-pay

## 2017-12-16 VITALS — BP 120/58 | HR 88 | Temp 97.6°F | Resp 18 | Wt 132.0 lb

## 2017-12-16 VITALS — BP 105/48 | HR 73 | Temp 97.3°F | Resp 16

## 2017-12-16 DIAGNOSIS — C3491 Malignant neoplasm of unspecified part of right bronchus or lung: Secondary | ICD-10-CM

## 2017-12-16 DIAGNOSIS — D649 Anemia, unspecified: Secondary | ICD-10-CM

## 2017-12-16 DIAGNOSIS — R531 Weakness: Secondary | ICD-10-CM

## 2017-12-16 DIAGNOSIS — Z5111 Encounter for antineoplastic chemotherapy: Secondary | ICD-10-CM | POA: Diagnosis not present

## 2017-12-16 DIAGNOSIS — D469 Myelodysplastic syndrome, unspecified: Secondary | ICD-10-CM

## 2017-12-16 DIAGNOSIS — D46Z Other myelodysplastic syndromes: Secondary | ICD-10-CM | POA: Diagnosis not present

## 2017-12-16 DIAGNOSIS — R634 Abnormal weight loss: Secondary | ICD-10-CM

## 2017-12-16 DIAGNOSIS — Z9289 Personal history of other medical treatment: Secondary | ICD-10-CM

## 2017-12-16 DIAGNOSIS — D638 Anemia in other chronic diseases classified elsewhere: Secondary | ICD-10-CM

## 2017-12-16 LAB — COMPREHENSIVE METABOLIC PANEL
ALT: 16 U/L (ref 14–54)
AST: 25 U/L (ref 15–41)
Albumin: 3.2 g/dL — ABNORMAL LOW (ref 3.5–5.0)
Alkaline Phosphatase: 70 U/L (ref 38–126)
Anion gap: 8 (ref 5–15)
BUN: 31 mg/dL — ABNORMAL HIGH (ref 6–20)
CHLORIDE: 105 mmol/L (ref 101–111)
CO2: 27 mmol/L (ref 22–32)
CREATININE: 1.34 mg/dL — AB (ref 0.44–1.00)
Calcium: 9.1 mg/dL (ref 8.9–10.3)
GFR calc non Af Amer: 37 mL/min — ABNORMAL LOW (ref >60)
GFR, EST AFRICAN AMERICAN: 42 mL/min — AB (ref >60)
Glucose, Bld: 136 mg/dL — ABNORMAL HIGH (ref 65–99)
POTASSIUM: 3.6 mmol/L (ref 3.5–5.1)
SODIUM: 140 mmol/L (ref 135–145)
Total Bilirubin: 0.8 mg/dL (ref 0.3–1.2)
Total Protein: 7 g/dL (ref 6.5–8.1)

## 2017-12-16 LAB — CBC WITH DIFFERENTIAL/PLATELET
Basophils Absolute: 0.1 10*3/uL (ref 0.0–0.1)
Basophils Relative: 3 %
EOS ABS: 0.1 10*3/uL (ref 0.0–0.7)
Eosinophils Relative: 4 %
HCT: 25.7 % — ABNORMAL LOW (ref 36.0–46.0)
HEMOGLOBIN: 8 g/dL — AB (ref 12.0–15.0)
LYMPHS ABS: 0.4 10*3/uL (ref 0.7–4.0)
Lymphocytes Relative: 17 %
MCH: 29.2 pg (ref 26.0–34.0)
MCHC: 31.1 g/dL (ref 30.0–36.0)
MCV: 93.8 fL (ref 78.0–100.0)
MONOS PCT: 18 %
Monocytes Absolute: 0.5 10*3/uL (ref 0.1–1.0)
NEUTROS PCT: 58 %
Neutro Abs: 1.5 10*3/uL (ref 1.7–7.7)
Platelets: 350 10*3/uL (ref 150–400)
RBC: 2.74 MIL/uL — AB (ref 3.87–5.11)
RDW: 14 % (ref 11.5–15.5)
WBC: 2.6 10*3/uL — AB (ref 4.0–10.5)

## 2017-12-16 LAB — PREPARE RBC (CROSSMATCH)

## 2017-12-16 LAB — LACTATE DEHYDROGENASE: LDH: 220 U/L — AB (ref 98–192)

## 2017-12-16 MED ORDER — PALONOSETRON HCL INJECTION 0.25 MG/5ML
0.2500 mg | Freq: Once | INTRAVENOUS | Status: AC
  Administered 2017-12-16: 0.25 mg via INTRAVENOUS

## 2017-12-16 MED ORDER — SODIUM CHLORIDE 0.9 % IV SOLN
75.0000 mg/m2 | Freq: Once | INTRAVENOUS | Status: AC
  Administered 2017-12-16: 130 mg via INTRAVENOUS
  Filled 2017-12-16: qty 13

## 2017-12-16 MED ORDER — SODIUM CHLORIDE 0.9% FLUSH
10.0000 mL | INTRAVENOUS | Status: DC | PRN
  Administered 2017-12-16: 10 mL
  Filled 2017-12-16: qty 10

## 2017-12-16 MED ORDER — DEXAMETHASONE SODIUM PHOSPHATE 10 MG/ML IJ SOLN
10.0000 mg | Freq: Once | INTRAMUSCULAR | Status: AC
  Administered 2017-12-16: 10 mg via INTRAVENOUS

## 2017-12-16 MED ORDER — SODIUM CHLORIDE 0.9% FLUSH
10.0000 mL | INTRAVENOUS | Status: AC | PRN
  Administered 2017-12-16: 10 mL

## 2017-12-16 MED ORDER — ACETAMINOPHEN 325 MG PO TABS
650.0000 mg | ORAL_TABLET | Freq: Once | ORAL | Status: AC
  Administered 2017-12-16: 650 mg via ORAL

## 2017-12-16 MED ORDER — DEXAMETHASONE SODIUM PHOSPHATE 10 MG/ML IJ SOLN
INTRAMUSCULAR | Status: AC
  Filled 2017-12-16: qty 1

## 2017-12-16 MED ORDER — SODIUM CHLORIDE 0.9 % IV SOLN
Freq: Once | INTRAVENOUS | Status: AC
Start: 2017-12-16 — End: 2017-12-16
  Administered 2017-12-16: 10:00:00 via INTRAVENOUS

## 2017-12-16 MED ORDER — DIPHENHYDRAMINE HCL 25 MG PO CAPS
ORAL_CAPSULE | ORAL | Status: AC
  Filled 2017-12-16: qty 1

## 2017-12-16 MED ORDER — SODIUM CHLORIDE 0.9 % IV SOLN: 250.0000 mL | Freq: Once | INTRAVENOUS | Status: DC

## 2017-12-16 MED ORDER — SODIUM CHLORIDE 0.9 % IV SOLN: 10.0000 mg | Freq: Once | INTRAVENOUS | Status: DC

## 2017-12-16 MED ORDER — PALONOSETRON HCL INJECTION 0.25 MG/5ML
INTRAVENOUS | Status: AC
  Filled 2017-12-16: qty 5

## 2017-12-16 MED ORDER — DIPHENHYDRAMINE HCL 25 MG PO CAPS
25.0000 mg | ORAL_CAPSULE | Freq: Once | ORAL | Status: AC
  Administered 2017-12-16: 25 mg via ORAL
  Filled 2017-12-16: qty 1

## 2017-12-16 MED ORDER — DRONABINOL 2.5 MG PO CAPS: 2.5000 mg | ORAL_CAPSULE | Freq: Two times a day (BID) | ORAL | 0 refills | Status: DC

## 2017-12-16 MED ORDER — ACETAMINOPHEN 325 MG PO TABS
ORAL_TABLET | ORAL | Status: AC
  Filled 2017-12-16: qty 2

## 2017-12-16 NOTE — Assessment & Plan Note (Addendum)
1.  MDS, high risk by IPSS-R: Bone marrow aspiration and biopsy done on 10/03/2017 shows erythroid hyperplasia, increased in erythroid precursors with atypia (less than 10%) in the form of nuclear irregularities, nuclear budding and multi nucleation.  Megakaryocytes are increased with frequent small, hyperlobulated and hyperchromatic forms.  Granulocytic precursors have no significant dysplasia. -Chromosome analysis shows 13, XX, del(3)(q21q29),add(7)(p15),del(13)(q12q14)[6]/46,XX[14] -FISH results show 5 q-, loss of chromosome 7, and trisomy 8. -She received a total of 5 points based on revised IPSS system, with a median survival in the absence of therapy of 1.6 years, 25% AML progression in the absence of therapy and 1.4 years.  She has received chemotherapy with carboplatin and etoposide in 2017.  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  She has been receiving Aranesp for more than a year, dose increased to 200 mcg every 2 weeks in December 2019.  We have given Aranesp at last visit a week ago.  She completely lost response to erythropoiesis stimulating agents. - Azacitidine 75 mg/m x 5 days every 28 days cycle 1 started on 11/17/2017 - Received 1 unit of blood transfusion for hemoglobin of 7 on 11/17/2017, 2 units of blood transfusion on 12/03/2017, on cycle 1 day 14. - CBC from today shows ANC of 1500.  Hemoglobin is 8.  Platelet count is 350.  We have repeated CBC and confirm these results.  She has gotten excellent response after first cycle.  Even though her hemoglobin is 8, as she is symptomatic I will transfuse her 1 unit.  We will proceed with the azacitidine 75 mg per M square for 4 days to cycle.  We will reevaluate her in 2 weeks.  2.  Weight loss: She lost 5 to 6 pounds in the last 2 weeks.  She is only eating 2 meals per day.  We will start her on Marinol 2.5 mg twice daily.  I have discussed the side effects in detail.  We will titrate up as needed.  3.  Small cell lung cancer: She  has completed chemoradiation therapy around July 2017.  Her last CT scan was in December 2018.  We will plan to repeat CT scan soon.

## 2017-12-16 NOTE — Patient Instructions (Signed)
[facility] Discharge Instructions for Patients Receiving Chemotherapy  Today you received the following chemotherapy agents vidaza and one unit of blood today.    If you develop nausea and vomiting that is not controlled by your nausea medication, call the clinic.   BELOW ARE SYMPTOMS THAT SHOULD BE REPORTED IMMEDIATELY:  *FEVER GREATER THAN 100.5 F  *CHILLS WITH OR WITHOUT FEVER  NAUSEA AND VOMITING THAT IS NOT CONTROLLED WITH YOUR NAUSEA MEDICATION  *UNUSUAL SHORTNESS OF BREATH  *UNUSUAL BRUISING OR BLEEDING  TENDERNESS IN MOUTH AND THROAT WITH OR WITHOUT PRESENCE OF ULCERS  *URINARY PROBLEMS  *BOWEL PROBLEMS  UNUSUAL RASH Items with * indicate a potential emergency and should be followed up as soon as possible.  Feel free to call the clinic should you have any questions or concerns. The clinic phone number is [phone].  Please show the [facility] at check-in to the Emergency Department and triage nurse.

## 2017-12-16 NOTE — Progress Notes (Signed)
Patient seen by Dr. Delton Coombes and ok for treatment today.  Patient will receive days 1 thru 4 this week and will not return on Monday for day 5 verbal order Dr. Delton Coombes.  Patient will also receive a unit of blood today.    Patient tolerated chemotherapy and blood transfusion with no complaints voiced.  Port site left accessed with good blood return and no complaints of pain with flush.  Dressing intact.  VSS with discharge and left via wheelchair with family with no s/s of distress noted.

## 2017-12-16 NOTE — Progress Notes (Signed)
Janet Fletcher, [place] [address]   CLINIC:  Medical Oncology/Hematology  PCP:  Chevis Pretty, [place] [address] [place] [address] [phone]   REASON FOR VISIT:  Follow-up for MDS.  CURRENT THERAPY: Azacitidine   BRIEF ONCOLOGIC HISTORY:    Small cell carcinoma of right lung ([facility])   11/28/2015 Imaging    Large anterior mediastinal mass in continuity with a R hilar mass, narrowing of SVC, abnormal densities in RUL, 9 mm spiculated nodule, nonspec. hypodensity in liver      12/05/2015 PET scan    Large hypermetabolic paratracheal mass c/w SCLC, perihilar nodular densities in RML, mild metabolic activity RUL nodule, no distant metastatic disease      12/11/2015 Procedure    Video bronch with biopsies and brushings, endobronchial ultrasound with mediastinal LN aspiration. Dr. Roxan Hockey      12/11/2015 Pathology Results    Trachea biopsy negative, FNA RUL malignant cells c/w SCLC      12/13/2015 - 03/15/2016 Chemotherapy    Cisplatin/Etoposide x 5 cycles.  Cycle #6 cancelled due to significant cytopenias      12/25/2015 Imaging    MRI brain- No intracranial parenchymal enhancing lesion. Tiny right frontal calvarial enhancing lesion. Small metastatic lesion not excluded although this may represent an incidentally detected benign process.      12/29/2015 - 02/13/2016 Radiation Therapy    XRT      02/14/2016 Treatment Plan Change    Chemotherapy deferred x 1 week      02/21/2016 Treatment Plan Change    Carboplatin dose reduced by 20% and Etoposide dose reduced by 10%.      04/24/2016 Imaging    CT CAP- Chest Impression:  1. Marked reduction in mediastinal and RIGHT hilar lymphadenopathy with no residual pathologically enlarged nodes. 2. Resolution of perihilar nodularity in the RIGHT lung which was hypermetabolic on FDG PET. 3. Persistent nodule in the RIGHT upper lobe which is not hypermetabolic. 4. New ground-glass  nodule in the RIGHT upper lobe is nonspecific and may relate to therapy.  Abdomen / Pelvis Impression:  1. No evidence of metastatic disease in the abdomen pelvis. 2. Atherosclerotic calcification of the aorta.      04/24/2016 Imaging    Bone scan- Nonspecific increased tracer localization at approximately T11 vertebral body; this is of uncertain etiology, could represent a degenerative process though metastatic disease is not excluded.      04/29/2016 -  Radiation Therapy    PCI -- 25 Gy      07/24/2016 Imaging    CT CAP- 1. There has been interval decrease in size of mediastinal lymph nodes. The index lesion within the right upper lobe is also decreased in size from previous exam. 2. New subpleural nodule is noted within the posteromedial right lower lobe. Attention on follow-up imaging advised. 3. Stable ground-glass attenuating nodule within the right lower lobe and anteromedial left upper lobe 4. Aortic atherosclerosis and coronary artery calcification      07/29/2016 Imaging    MRI t-spine: 1. Stable benign slightly enhancing sclerotic lesion in the right posterolateral aspect of the T11 vertebral body, unchanged for 5 years. This is felt to account for the subtle increased activity at T11 on the SPECT bone scan. 2. Slight degenerative disc disease with small disc protrusions at T1-2 and T2-3 and T12-L1 with no neural impingement. 3. No evidence of metastatic disease.      09/23/2016 Imaging    CT chest- Radiation  changes in the central right upper lobe/ perihilar region.  Stable small mediastinal lymph nodes, unchanged.  No findings suspicious for new/progressive metastatic disease.      12/25/2016 Imaging    CT C/A/P: IMPRESSION: 1. Continued further decrease in mediastinal lymph nodes. 2. Index nodule antero medial right upper lobe has decreased. The new 5 mm right lower lobe subpleural nodule seen on the previous study has resolved in the interval. Ground-glass  nodules in the upper lobes bilaterally are stable. 3. Interval development of interstitial and alveolar opacity in the parahilar right lung is presumably radiation related. Attention on follow-up recommended. 4. New tiny right pleural effusion with areas of apparent pleural enhancement in the posterior right costophrenic sulcus. Close attention on follow-up recommended. 5. Marked abdominal aortic atherosclerosis with likely infrarenal significant stenosis.       07/02/2017 Imaging    CT Chest w/ contrast: Resolution of central right lower lobe infiltrate and right pleural effusion since prior study.  Stable ground-glass opacities in both upper lobes.  No new or progressive disease      11/16/2017 -  Chemotherapy    The patient had palonosetron (ALOXI) injection 0.25 mg, 0.25 mg, Intravenous,  Once, 1 of 4 cycles Administration: 0.25 mg (11/17/2017), 0.25 mg (11/19/2017), 0.25 mg (11/21/2017) azaCITIDine (VIDAZA) 130 mg in sodium chloride 0.9 % 50 mL chemo infusion, 75 mg/m2 = 130 mg, Intravenous, Once, 1 of 4 cycles Administration: 130 mg (11/17/2017), 130 mg (11/18/2017), 130 mg (11/19/2017), 130 mg (11/20/2017), 130 mg (11/21/2017)  for chemotherapy treatment.        MDS (myelodysplastic syndrome), high grade ([facility])   10/20/2017 Initial Diagnosis    MDS (myelodysplastic syndrome), high grade ([facility])      11/16/2017 -  Chemotherapy    The patient had palonosetron (ALOXI) injection 0.25 mg, 0.25 mg, Intravenous,  Once, 1 of 4 cycles Administration: 0.25 mg (11/17/2017), 0.25 mg (11/19/2017), 0.25 mg (11/21/2017) azaCITIDine (VIDAZA) 130 mg in sodium chloride 0.9 % 50 mL chemo infusion, 75 mg/m2 = 130 mg, Intravenous, Once, 1 of 4 cycles Administration: 130 mg (11/17/2017), 130 mg (11/18/2017), 130 mg (11/19/2017), 130 mg (11/20/2017), 130 mg (11/21/2017)  for chemotherapy treatment.          INTERVAL HISTORY:  Janet Fletcher 82 y.o. female returns for follow-up of MDS.    Treatment started with  Vidaza on 11/17/17.  Hgb was 7 g/dL at that time and she received 1 unit PRBCs at that time as well.   Critical lab values on cycle 1,day 14 noted: Hgb 6 g/dL. WBCs 1.3. Other labs reviewed: Plts 56,000, ANC 800.   Here today with her daughters.   She has been feeling weak.  Not moving much.  Family members are following her to the bathroom, as she does have occasional balance problems.  She is continuing stool softeners and her constipation is well controlled.  She feels cold all the time.  No fevers or chills noted.  Decreased in appetite was noted.  She lost about 5 to 6 pounds since last visit 2 weeks ago.  She does not have any taste changes.  She has received 2 units of blood transfusion about 2 weeks ago for hemoglobin of 6.  Denies any nausea, vomiting or diarrhea.  Denies any headaches or vision changes.  No lightheadedness was noted.     REVIEW OF SYSTEMS:  Review of Systems  Constitutional: Positive for appetite change, fatigue and unexpected weight change.  Respiratory: Positive for shortness of breath.  Gastrointestinal: Positive for constipation.  Musculoskeletal:       Lower extremity weakness.  Skin: Negative for itching.       Itching in the right forearm.  Neurological: Negative for dizziness.  All other systems reviewed and are negative.    PAST MEDICAL/SURGICAL HISTORY:  Past Medical History:  Diagnosis Date  . Allergy   . Cancer ([facility])    lung  . Hyperlipidemia   . Iron deficiency anemia 12/15/2015  . Low vitamin B12 level 12/15/2015  . Osteopenia    Past Surgical History:  Procedure Laterality Date  . CATARACT EXTRACTION, BILATERAL Bilateral   . COLONOSCOPY N/A 12/06/2016   Procedure: COLONOSCOPY;  Surgeon: Danie Binder, MD;  Location: [facility];  Service: Endoscopy;  Laterality: N/A;  1:45pm  . ESOPHAGOGASTRODUODENOSCOPY N/A 12/06/2016   Procedure: ESOPHAGOGASTRODUODENOSCOPY (EGD);  Surgeon: Danie Binder, MD;  Location: [facility];  Service:  Endoscopy;  Laterality: N/A;  . MINOR EXCISION EAR CANAL CYST Left    30+ years ago  . SMALL INTESTINE SURGERY    . VIDEO BRONCHOSCOPY WITH ENDOBRONCHIAL ULTRASOUND N/A 12/11/2015   Procedure: VIDEO BRONCHOSCOPY WITH ENDOBRONCHIAL ULTRASOUND;  Surgeon: Melrose Nakayama, MD;  Location: [facility];  Service: Thoracic;  Laterality: N/A;     SOCIAL HISTORY:  Social History   Socioeconomic History  . Marital status: Married    Spouse name: Not on file  . Number of children: Not on file  . Years of education: Not on file  . Highest education level: Not on file  Occupational History  . Not on file  Social Needs  . Financial resource strain: Not on file  . Food insecurity:    Worry: Not on file    Inability: Not on file  . Transportation needs:    Medical: Not on file    Non-medical: Not on file  Tobacco Use  . Smoking status: Former Smoker    Packs/day: 0.25    Years: 50.00    Pack years: 12.50    Last attempt to quit: 01/08/2016    Years since quitting: 1.9  . Smokeless tobacco: Never Used  Substance and Sexual Activity  . Alcohol use: No  . Drug use: No  . Sexual activity: Not Currently  Lifestyle  . Physical activity:    Days per week: Not on file    Minutes per session: Not on file  . Stress: Not on file  Relationships  . Social connections:    Talks on phone: Not on file    Gets together: Not on file    Attends religious service: Not on file    Active member of club or organization: Not on file    Attends meetings of clubs or organizations: Not on file    Relationship status: Not on file  . Intimate partner violence:    Fear of current or ex partner: Not on file    Emotionally abused: Not on file    Physically abused: Not on file    Forced sexual activity: Not on file  Other Topics Concern  . Not on file  Social History Narrative  . Not on file    FAMILY HISTORY:  Family History  Problem Relation Age of Onset  . Cancer Mother   . Hypertension Father   .  Colon cancer Neg Hx     CURRENT MEDICATIONS:  Outpatient Encounter Medications as of 12/16/2017  Medication Sig  . cyanocobalamin (,VITAMIN B-12,) 1000 MCG/ML injection Inject 1 mL (  1,000 mcg total) into the muscle every 30 (thirty) days.  Marland Kitchen donepezil (ARICEPT) 10 MG tablet Take 1 tablet (10 mg total) by mouth at bedtime.  . dronabinol (MARINOL) 2.5 MG capsule Take 1 capsule (2.5 mg total) by mouth 2 (two) times daily before a meal.  . memantine (NAMENDA) 5 MG tablet Take 1 tablet (5 mg total) by mouth 2 (two) times daily.  . ondansetron (ZOFRAN) 8 MG tablet Take 1 tablet (8 mg total) by mouth 2 (two) times daily. Start on the third day from the last day of chemotherapy  . prochlorperazine (COMPAZINE) 10 MG tablet Take 1 tablet (10 mg total) by mouth every 6 (six) hours as needed for nausea or vomiting.   No facility-administered encounter medications on file as of 12/16/2017.     ALLERGIES:  Allergies  Allergen Reactions  . Flagyl [Metronidazole Hcl] Nausea Only  . Penicillins Other (See Comments)    Has patient had a PCN reaction causing immediate rash, facial/tongue/throat swelling, SOB or lightheadedness with hypotension: unknown Has patient had a PCN reaction causing severe rash involving mucus membranes or skin necrosis: unknown Has patient had a PCN reaction that required hospitalization unknown Has patient had a PCN reaction occurring within the last 10 years: unknown  If all of the above answers are "NO", then may proceed with Cephalosporin use.     PHYSICAL EXAM:  ECOG Performance status: 1  Vitals:   12/16/17 0841  BP: (!) 120/58  Pulse: 88  Resp: 18  Temp: 97.6 F (36.4 C)   Filed Weights   12/16/17 0841  Weight: 132 lb (59.9 kg)    Physical Exam HEENT shows oropharynx has no thrush or mucositis. Cardiovascular S1-S2 regular rate and rhythm. Lungs are bilaterally clear to auscultation. Extremities has no edema or cyanosis. Skin has no rashes.  LABORATORY  DATA:  I have reviewed the labs as listed.  CBC    Component Value Date/Time   WBC 2.6 (L) 12/16/2017 0807   RBC 2.74 (L) 12/16/2017 0807   HGB 8.0 (L) 12/16/2017 0807   HCT 25.7 (L) 12/16/2017 0807   PLT 350 12/16/2017 0807   MCV 93.8 12/16/2017 0807   MCV 71.9 (A) 08/20/2013 1559   MCH 29.2 12/16/2017 0807   MCHC 31.1 12/16/2017 0807   RDW 14.0 12/16/2017 0807   LYMPHSABS 0.4 12/16/2017 0807   MONOABS 0.5 12/16/2017 0807   EOSABS 0.1 12/16/2017 0807   BASOSABS 0.1 12/16/2017 0807   CMP Latest Ref Rng & Units 12/16/2017 12/03/2017 11/17/2017  Glucose 65 - 99 mg/dL 136(H) 116(H) 120(H)  BUN 6 - 20 mg/dL 31(H) 29(H) 33(H)  Creatinine 0.44 - 1.00 mg/dL 1.34(H) 1.29(H) 1.34(H)  Sodium 135 - 145 mmol/L 140 139 141  Potassium 3.5 - 5.1 mmol/L 3.6 3.9 4.0  Chloride 101 - 111 mmol/L 105 105 108  CO2 22 - 32 mmol/L 27 28 25   Calcium 8.9 - 10.3 mg/dL 9.1 8.7(L) 8.9  Total Protein 6.5 - 8.1 g/dL 7.0 6.5 6.2(L)  Total Bilirubin 0.3 - 1.2 mg/dL 0.8 1.2 1.1  Alkaline Phos 38 - 126 U/L 70 57 62  AST 15 - 41 U/L 25 24 22   ALT 14 - 54 U/L 16 14 12(L)            ASSESSMENT & PLAN:   MDS (myelodysplastic syndrome), high grade (HCC) 1.  MDS, high risk by IPSS-R: Bone marrow aspiration and biopsy done on 10/03/2017 shows erythroid hyperplasia, increased in erythroid precursors with atypia (less  than 10%) in the form of nuclear irregularities, nuclear budding and multi nucleation.  Megakaryocytes are increased with frequent small, hyperlobulated and hyperchromatic forms.  Granulocytic precursors have no significant dysplasia. 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 -She received a total of 5 points based on revised IPSS system, with a median survival in the absence of therapy of 1.6 years, 25% AML progression in the absence of therapy and 1.4 years.  She has received chemotherapy  with carboplatin and etoposide in 2017.  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  She has been receiving Aranesp for more than a year, dose increased to 200 mcg every 2 weeks in December 2019.  We have given Aranesp at last visit a week ago.  She completely lost response to erythropoiesis stimulating agents. - Azacitidine 75 mg/m x 5 days every 28 days cycle 1 started on 11/17/2017 - Received 1 unit of blood transfusion for hemoglobin of 7 on 11/17/2017, 2 units of blood transfusion on 12/03/2017, on cycle 1 day 14. - CBC from today shows ANC of 1500.  Hemoglobin is 8.  Platelet count is 350.  We have repeated CBC and confirm these results.  She has gotten excellent response after first cycle.  Even though her hemoglobin is 8, as she is symptomatic I will transfuse her 1 unit.  We will proceed with the azacitidine 75 mg per M square for 4 days to cycle.  We will reevaluate her in 2 weeks.  2.  Weight loss: She lost 5 to 6 pounds in the last 2 weeks.  She is only eating 2 meals per day.  We will start her on Marinol 2.5 mg twice daily.  I have discussed the side effects in detail.  We will titrate up as needed.  3.  Small cell lung cancer: She has completed chemoradiation therapy around July 2017.  Her last CT scan was in December 2018.  We will plan to repeat CT scan soon.      Orders placed this encounter:  No orders of the defined types were placed in this encounter.    This note includes documentation from Mike Craze, NP, who was present during this patient's office visit and evaluation.  I have reviewed this note for its completeness and accuracy.  I have edited this note accordingly based on my findings and medical opinion.      Derek Jack, MD [place] [phone]

## 2017-12-17 ENCOUNTER — Inpatient Hospital Stay (HOSPITAL_COMMUNITY): Payer: Medicare Other

## 2017-12-17 VITALS — BP 98/40 | HR 80 | Temp 97.6°F | Resp 16

## 2017-12-17 DIAGNOSIS — D46Z Other myelodysplastic syndromes: Secondary | ICD-10-CM

## 2017-12-17 DIAGNOSIS — C3491 Malignant neoplasm of unspecified part of right bronchus or lung: Secondary | ICD-10-CM

## 2017-12-17 DIAGNOSIS — Z5111 Encounter for antineoplastic chemotherapy: Secondary | ICD-10-CM | POA: Diagnosis not present

## 2017-12-17 LAB — TYPE AND SCREEN
ABO/RH(D): A POS
Antibody Screen: NEGATIVE
UNIT DIVISION: 0

## 2017-12-17 LAB — BPAM RBC
Blood Product Expiration Date: 201906062359
ISSUE DATE / TIME: 201905281133
Unit Type and Rh: 6200

## 2017-12-17 MED ORDER — SODIUM CHLORIDE 0.9 % IV SOLN: 10.0000 mg | Freq: Once | INTRAVENOUS | Status: DC

## 2017-12-17 MED ORDER — DEXAMETHASONE SODIUM PHOSPHATE 10 MG/ML IJ SOLN
10.0000 mg | Freq: Once | INTRAMUSCULAR | Status: AC
  Administered 2017-12-17: 10 mg via INTRAVENOUS

## 2017-12-17 MED ORDER — SODIUM CHLORIDE 0.9 % IV SOLN
75.0000 mg/m2 | Freq: Once | INTRAVENOUS | Status: AC
  Administered 2017-12-17: 130 mg via INTRAVENOUS
  Filled 2017-12-17: qty 13

## 2017-12-17 MED ORDER — DEXAMETHASONE SODIUM PHOSPHATE 10 MG/ML IJ SOLN
INTRAMUSCULAR | Status: AC
  Filled 2017-12-17: qty 1

## 2017-12-17 MED ORDER — SODIUM CHLORIDE 0.9 % IV SOLN
Freq: Once | INTRAVENOUS | Status: AC
  Administered 2017-12-17: 09:00:00 via INTRAVENOUS

## 2017-12-17 MED ORDER — SODIUM CHLORIDE 0.9% FLUSH
10.0000 mL | INTRAVENOUS | Status: DC | PRN
  Administered 2017-12-17: 10 mL
  Filled 2017-12-17: qty 10

## 2017-12-17 NOTE — Patient Instructions (Signed)
[facility] Discharge Instructions for Patients Receiving Chemotherapy   Beginning January 23rd 2017 lab work for the Cancer Center will be done in the  Main lab at [facility] on 1st floor. If you have a lab appointment with the Cancer Center please come in thru the  Main Entrance and check in at the main information desk   Today you received the following chemotherapy agents   To help prevent nausea and vomiting after your treatment, we encourage you to take your nausea medication     If you develop nausea and vomiting, or diarrhea that is not controlled by your medication, call the clinic.  The clinic phone number is [phone]. Office hours are Monday-Friday 8:30am-5:00pm.  BELOW ARE SYMPTOMS THAT SHOULD BE REPORTED IMMEDIATELY:  *FEVER GREATER THAN 101.0 F  *CHILLS WITH OR WITHOUT FEVER  NAUSEA AND VOMITING THAT IS NOT CONTROLLED WITH YOUR NAUSEA MEDICATION  *UNUSUAL SHORTNESS OF BREATH  *UNUSUAL BRUISING OR BLEEDING  TENDERNESS IN MOUTH AND THROAT WITH OR WITHOUT PRESENCE OF ULCERS  *URINARY PROBLEMS  *BOWEL PROBLEMS  UNUSUAL RASH Items with * indicate a potential emergency and should be followed up as soon as possible. If you have an emergency after office hours please contact your primary care physician or go to the nearest emergency department.  Please call the clinic during office hours if you have any questions or concerns.   You may also contact the Patient Navigator at [phone] should you have any questions or need assistance in obtaining follow up care.      Resources For Cancer Patients and their Caregivers ? American Cancer Society: Can assist with transportation, wigs, general needs, runs Look Good Feel Better.        1-888-227-6333 ? Cancer Care: Provides financial assistance, online support groups, medication/co-pay assistance.  1-800-813-HOPE (4673) ? [facility] Assists [place] cancer  patients and their families through emotional , educational and financial support.  336-427-4357 ? [place] DSS Where to apply for food stamps, Medicaid and utility assistance. 336-342-1394 ? [facility]: Transportation to medical appointments. 336-347-2287 ? Social Security Administration: May apply for disability if have a Stage IV cancer. 336-342-7796 1-800-772-1213 ? [place] Aging, Disability and Transit Services: Assists with nutrition, care and transit needs. 336-349-2343         

## 2017-12-18 ENCOUNTER — Inpatient Hospital Stay (HOSPITAL_COMMUNITY): Payer: Medicare Other

## 2017-12-18 VITALS — BP 104/47 | HR 73 | Temp 97.4°F | Resp 16

## 2017-12-18 DIAGNOSIS — C3491 Malignant neoplasm of unspecified part of right bronchus or lung: Secondary | ICD-10-CM

## 2017-12-18 DIAGNOSIS — D46Z Other myelodysplastic syndromes: Secondary | ICD-10-CM

## 2017-12-18 DIAGNOSIS — Z5111 Encounter for antineoplastic chemotherapy: Secondary | ICD-10-CM | POA: Diagnosis not present

## 2017-12-18 MED ORDER — DEXAMETHASONE SODIUM PHOSPHATE 10 MG/ML IJ SOLN
INTRAMUSCULAR | Status: AC
  Filled 2017-12-18: qty 1

## 2017-12-18 MED ORDER — PALONOSETRON HCL INJECTION 0.25 MG/5ML
0.2500 mg | Freq: Once | INTRAVENOUS | Status: AC
  Administered 2017-12-18: 0.25 mg via INTRAVENOUS

## 2017-12-18 MED ORDER — SODIUM CHLORIDE 0.9% FLUSH
10.0000 mL | INTRAVENOUS | Status: DC | PRN
  Administered 2017-12-18 (×2): 10 mL
  Filled 2017-12-18 (×2): qty 10

## 2017-12-18 MED ORDER — DEXAMETHASONE SODIUM PHOSPHATE 100 MG/10ML IJ SOLN: 10.0000 mg | Freq: Once | INTRAMUSCULAR | Status: DC

## 2017-12-18 MED ORDER — SODIUM CHLORIDE 0.9 % IV SOLN
Freq: Once | INTRAVENOUS | Status: AC
Start: 2017-12-18 — End: 2017-12-18
  Administered 2017-12-18: 500 mL via INTRAVENOUS

## 2017-12-18 MED ORDER — SODIUM CHLORIDE 0.9 % IV SOLN
75.0000 mg/m2 | Freq: Once | INTRAVENOUS | Status: AC
  Administered 2017-12-18: 130 mg via INTRAVENOUS
  Filled 2017-12-18: qty 13

## 2017-12-18 MED ORDER — PALONOSETRON HCL INJECTION 0.25 MG/5ML
INTRAVENOUS | Status: AC
  Filled 2017-12-18: qty 5

## 2017-12-18 MED ORDER — DEXAMETHASONE SODIUM PHOSPHATE 10 MG/ML IJ SOLN
10.0000 mg | Freq: Once | INTRAMUSCULAR | Status: AC
  Administered 2017-12-18: 10 mg via INTRAVENOUS

## 2017-12-18 NOTE — Patient Instructions (Signed)
[facility] Discharge Instructions for Patients Receiving Chemotherapy  Today you received the following chemotherapy agents vidaza today.    If you develop nausea and vomiting that is not controlled by your nausea medication, call the clinic.   BELOW ARE SYMPTOMS THAT SHOULD BE REPORTED IMMEDIATELY:  *FEVER GREATER THAN 100.5 F  *CHILLS WITH OR WITHOUT FEVER  NAUSEA AND VOMITING THAT IS NOT CONTROLLED WITH YOUR NAUSEA MEDICATION  *UNUSUAL SHORTNESS OF BREATH  *UNUSUAL BRUISING OR BLEEDING  TENDERNESS IN MOUTH AND THROAT WITH OR WITHOUT PRESENCE OF ULCERS  *URINARY PROBLEMS  *BOWEL PROBLEMS  UNUSUAL RASH Items with * indicate a potential emergency and should be followed up as soon as possible.  Feel free to call the clinic should you have any questions or concerns. The clinic phone number is [phone].  Please show the [facility] at check-in to the Emergency Department and triage nurse.

## 2017-12-18 NOTE — Progress Notes (Signed)
Patient tolerated chemotherapy with no complaints voiced.  Port site clean and dry with no bruising or swelling noted at site.  Flushed with good blood return before and after administration of chemotherapy.  VSS with discharge and left via wheelchair with family.  No s/s of distress noted.

## 2017-12-19 ENCOUNTER — Inpatient Hospital Stay (HOSPITAL_COMMUNITY): Payer: Medicare Other

## 2017-12-19 VITALS — BP 101/45 | HR 69 | Temp 97.5°F | Resp 16

## 2017-12-19 DIAGNOSIS — D46Z Other myelodysplastic syndromes: Secondary | ICD-10-CM

## 2017-12-19 DIAGNOSIS — Z5111 Encounter for antineoplastic chemotherapy: Secondary | ICD-10-CM | POA: Diagnosis not present

## 2017-12-19 DIAGNOSIS — C3491 Malignant neoplasm of unspecified part of right bronchus or lung: Secondary | ICD-10-CM

## 2017-12-19 MED ORDER — SODIUM CHLORIDE 0.9 % IV SOLN: 10.0000 mg | Freq: Once | INTRAVENOUS | Status: DC

## 2017-12-19 MED ORDER — SODIUM CHLORIDE 0.9 % IV SOLN
Freq: Once | INTRAVENOUS | Status: AC
  Administered 2017-12-19: 09:00:00 via INTRAVENOUS

## 2017-12-19 MED ORDER — DEXAMETHASONE SODIUM PHOSPHATE 10 MG/ML IJ SOLN
10.0000 mg | Freq: Once | INTRAMUSCULAR | Status: AC
  Administered 2017-12-19: 10 mg via INTRAVENOUS
  Filled 2017-12-19: qty 1

## 2017-12-19 MED ORDER — HEPARIN SOD (PORK) LOCK FLUSH 100 UNIT/ML IV SOLN
500.0000 [IU] | Freq: Once | INTRAVENOUS | Status: AC | PRN
  Administered 2017-12-19: 500 [IU]
  Filled 2017-12-19: qty 5

## 2017-12-19 MED ORDER — SODIUM CHLORIDE 0.9 % IV SOLN
75.0000 mg/m2 | Freq: Once | INTRAVENOUS | Status: AC
  Administered 2017-12-19: 130 mg via INTRAVENOUS
  Filled 2017-12-19: qty 13

## 2017-12-19 NOTE — Progress Notes (Signed)
Tolerated infusion w/o adverse reaction.  Alert, in no distress.  Discharged via wheelchair in c/o family.

## 2017-12-22 ENCOUNTER — Ambulatory Visit (HOSPITAL_COMMUNITY): Payer: Medicare Other

## 2017-12-26 ENCOUNTER — Other Ambulatory Visit: Payer: Self-pay | Admitting: Nurse Practitioner

## 2017-12-26 DIAGNOSIS — R413 Other amnesia: Secondary | ICD-10-CM

## 2017-12-26 MED ORDER — MEMANTINE HCL 5 MG PO TABS: 5.0000 mg | ORAL_TABLET | Freq: Two times a day (BID) | ORAL | 1 refills | Status: DC

## 2017-12-26 NOTE — Telephone Encounter (Signed)
Pt aware Rx sent to [facility]

## 2017-12-30 ENCOUNTER — Inpatient Hospital Stay (HOSPITAL_COMMUNITY): Payer: Medicare Other | Attending: Hematology | Admitting: Dietician

## 2017-12-30 ENCOUNTER — Other Ambulatory Visit: Payer: Self-pay

## 2017-12-30 ENCOUNTER — Encounter (HOSPITAL_COMMUNITY): Payer: Self-pay | Admitting: Hematology

## 2017-12-30 ENCOUNTER — Inpatient Hospital Stay (HOSPITAL_COMMUNITY): Payer: Medicare Other | Attending: Hematology | Admitting: Hematology

## 2017-12-30 ENCOUNTER — Inpatient Hospital Stay (HOSPITAL_COMMUNITY): Payer: Medicare Other

## 2017-12-30 DIAGNOSIS — D46Z Other myelodysplastic syndromes: Secondary | ICD-10-CM

## 2017-12-30 DIAGNOSIS — Z5111 Encounter for antineoplastic chemotherapy: Secondary | ICD-10-CM | POA: Insufficient documentation

## 2017-12-30 DIAGNOSIS — Z9289 Personal history of other medical treatment: Secondary | ICD-10-CM

## 2017-12-30 DIAGNOSIS — L539 Erythematous condition, unspecified: Secondary | ICD-10-CM | POA: Insufficient documentation

## 2017-12-30 DIAGNOSIS — D649 Anemia, unspecified: Secondary | ICD-10-CM

## 2017-12-30 DIAGNOSIS — C3491 Malignant neoplasm of unspecified part of right bronchus or lung: Secondary | ICD-10-CM

## 2017-12-30 DIAGNOSIS — R5383 Other fatigue: Secondary | ICD-10-CM

## 2017-12-30 DIAGNOSIS — R634 Abnormal weight loss: Secondary | ICD-10-CM | POA: Diagnosis not present

## 2017-12-30 DIAGNOSIS — D469 Myelodysplastic syndrome, unspecified: Secondary | ICD-10-CM

## 2017-12-30 DIAGNOSIS — Z87891 Personal history of nicotine dependence: Secondary | ICD-10-CM | POA: Insufficient documentation

## 2017-12-30 LAB — COMPREHENSIVE METABOLIC PANEL
ALK PHOS: 63 U/L (ref 38–126)
ALT: 12 U/L — AB (ref 14–54)
ANION GAP: 8 (ref 5–15)
AST: 20 U/L (ref 15–41)
Albumin: 3.4 g/dL — ABNORMAL LOW (ref 3.5–5.0)
BUN: 22 mg/dL — ABNORMAL HIGH (ref 6–20)
CALCIUM: 9.2 mg/dL (ref 8.9–10.3)
CO2: 27 mmol/L (ref 22–32)
CREATININE: 1.12 mg/dL — AB (ref 0.44–1.00)
Chloride: 107 mmol/L (ref 101–111)
GFR, EST AFRICAN AMERICAN: 53 mL/min — AB (ref >60)
GFR, EST NON AFRICAN AMERICAN: 45 mL/min — AB (ref >60)
Glucose, Bld: 123 mg/dL — ABNORMAL HIGH (ref 65–99)
Potassium: 3.7 mmol/L (ref 3.5–5.1)
Sodium: 142 mmol/L (ref 135–145)
Total Bilirubin: 0.9 mg/dL (ref 0.3–1.2)
Total Protein: 6.4 g/dL — ABNORMAL LOW (ref 6.5–8.1)

## 2017-12-30 LAB — CBC WITH DIFFERENTIAL/PLATELET
BASOS ABS: 0 10*3/uL (ref 0.0–0.1)
BASOS PCT: 0 %
EOS ABS: 0.1 10*3/uL (ref 0.0–0.7)
Eosinophils Relative: 2 %
HEMATOCRIT: 28.8 % — AB (ref 36.0–46.0)
Hemoglobin: 9.2 g/dL — ABNORMAL LOW (ref 12.0–15.0)
Lymphocytes Relative: 11 %
Lymphs Abs: 0.3 10*3/uL — ABNORMAL LOW (ref 0.7–4.0)
MCH: 29.8 pg (ref 26.0–34.0)
MCHC: 31.9 g/dL (ref 30.0–36.0)
MCV: 93.2 fL (ref 78.0–100.0)
Monocytes Absolute: 0.1 10*3/uL (ref 0.1–1.0)
Monocytes Relative: 5 %
NEUTROS ABS: 2.4 10*3/uL (ref 1.7–7.7)
Neutrophils Relative %: 82 %
Platelets: 85 10*3/uL — ABNORMAL LOW (ref 150–400)
RBC: 3.09 MIL/uL — AB (ref 3.87–5.11)
RDW: 14.3 % (ref 11.5–15.5)
WBC: 2.9 10*3/uL — AB (ref 4.0–10.5)

## 2017-12-30 LAB — LACTATE DEHYDROGENASE: LDH: 176 U/L (ref 98–192)

## 2017-12-30 LAB — SAMPLE TO BLOOD BANK

## 2017-12-30 NOTE — Progress Notes (Signed)
Janet Fletcher, [place] [address]   CLINIC:  Medical Oncology/Hematology  PCP:  Janet Fletcher, [place] [address] [place] [address] [phone]   REASON FOR VISIT:  Follow-up for therapy related MDS.  CURRENT THERAPY: Azacitidine every 28 days.  BRIEF ONCOLOGIC HISTORY:    Small cell carcinoma of right lung (Janet Fletcher)   11/28/2015 Imaging    Large anterior mediastinal mass in continuity with a Janet hilar mass, narrowing of SVC, abnormal densities in RUL, 9 mm spiculated nodule, nonspec. hypodensity in liver      12/05/2015 PET scan    Large hypermetabolic paratracheal mass c/w SCLC, perihilar nodular densities in RML, mild metabolic activity RUL nodule, no distant metastatic disease      12/11/2015 Procedure    Video bronch with biopsies and brushings, endobronchial ultrasound with mediastinal LN aspiration. Janet Fletcher      12/11/2015 Pathology Results    Trachea biopsy negative, FNA RUL malignant cells c/w SCLC      12/13/2015 - 03/15/2016 Chemotherapy    Cisplatin/Etoposide x 5 cycles.  Cycle #6 cancelled due to significant cytopenias      12/25/2015 Imaging    MRI brain- No intracranial parenchymal enhancing lesion. Tiny right frontal calvarial enhancing lesion. Small metastatic lesion not excluded although this may represent an incidentally detected benign process.      12/29/2015 - 02/13/2016 Radiation Therapy    XRT      02/14/2016 Treatment Plan Change    Chemotherapy deferred x 1 week      02/21/2016 Treatment Plan Change    Carboplatin dose reduced by 20% and Etoposide dose reduced by 10%.      04/24/2016 Imaging    CT CAP- Chest Impression:  1. Marked reduction in mediastinal and RIGHT hilar lymphadenopathy with no residual pathologically enlarged nodes. 2. Resolution of perihilar nodularity in the RIGHT lung which was hypermetabolic on FDG PET. 3. Persistent nodule in the RIGHT upper lobe which is  not hypermetabolic. 4. New ground-glass nodule in the RIGHT upper lobe is nonspecific and may relate to therapy.  Abdomen / Pelvis Impression:  1. No evidence of metastatic disease in the abdomen pelvis. 2. Atherosclerotic calcification of the aorta.      04/24/2016 Imaging    Bone scan- Nonspecific increased tracer localization at approximately T11 vertebral body; this is of uncertain etiology, could represent a degenerative process though metastatic disease is not excluded.      04/29/2016 -  Radiation Therapy    PCI -- 25 Gy      07/24/2016 Imaging    CT CAP- 1. There has been interval decrease in size of mediastinal lymph nodes. The index lesion within the right upper lobe is also decreased in size from previous exam. 2. New subpleural nodule is noted within the posteromedial right lower lobe. Attention on follow-up imaging advised. 3. Stable ground-glass attenuating nodule within the right lower lobe and anteromedial left upper lobe 4. Aortic atherosclerosis and coronary artery calcification      07/29/2016 Imaging    MRI t-spine: 1. Stable benign slightly enhancing sclerotic lesion in the right posterolateral aspect of the T11 vertebral body, unchanged for 5 years. This is felt to account for the subtle increased activity at T11 on the SPECT bone scan. 2. Slight degenerative disc disease with small disc protrusions at T1-2 and T2-3 and T12-L1 with no neural impingement. 3. No evidence of metastatic disease.      09/23/2016 Imaging  CT chest- Radiation changes in the central right upper lobe/ perihilar region.  Stable small mediastinal lymph nodes, unchanged.  No findings suspicious for new/progressive metastatic disease.      12/25/2016 Imaging    CT C/A/P: IMPRESSION: 1. Continued further decrease in mediastinal lymph nodes. 2. Index nodule antero medial right upper lobe has decreased. The new 5 mm right lower lobe subpleural nodule seen on the previous study  has resolved in the interval. Ground-glass nodules in the upper lobes bilaterally are stable. 3. Interval development of interstitial and alveolar opacity in the parahilar right lung is presumably radiation related. Attention on follow-up recommended. 4. New tiny right pleural effusion with areas of apparent pleural enhancement in the posterior right costophrenic sulcus. Close attention on follow-up recommended. 5. Marked abdominal aortic atherosclerosis with likely infrarenal significant stenosis.       07/02/2017 Imaging    CT Chest w/ contrast: Resolution of central right lower lobe infiltrate and right pleural effusion since prior study.  Stable ground-glass opacities in both upper lobes.  No new or progressive disease      11/16/2017 -  Chemotherapy    The patient had palonosetron (ALOXI) injection 0.25 mg, 0.25 mg, Intravenous,  Once, 2 of 4 cycles Administration: 0.25 mg (11/17/2017), 0.25 mg (11/19/2017), 0.25 mg (11/21/2017), 0.25 mg (12/16/2017), 0.25 mg (12/18/2017) azaCITIDine (VIDAZA) 130 mg in sodium chloride 0.9 % 50 mL chemo infusion, 75 mg/m2 = 130 mg, Intravenous, Once, 2 of 4 cycles Administration: 130 mg (11/17/2017), 130 mg (11/18/2017), 130 mg (11/19/2017), 130 mg (11/20/2017), 130 mg (11/21/2017), 130 mg (12/16/2017), 130 mg (12/17/2017), 130 mg (12/18/2017)  for chemotherapy treatment.        MDS (myelodysplastic syndrome), high grade (Fletcher)   10/20/2017 Initial Diagnosis    MDS (myelodysplastic syndrome), high grade (Janet Fletcher)      11/16/2017 -  Chemotherapy    The patient had palonosetron (ALOXI) injection 0.25 mg, 0.25 mg, Intravenous,  Once, 2 of 4 cycles Administration: 0.25 mg (11/17/2017), 0.25 mg (11/19/2017), 0.25 mg (11/21/2017), 0.25 mg (12/16/2017), 0.25 mg (12/18/2017) azaCITIDine (VIDAZA) 130 mg in sodium chloride 0.9 % 50 mL chemo infusion, 75 mg/m2 = 130 mg, Intravenous, Once, 2 of 4 cycles Administration: 130 mg (11/17/2017), 130 mg (11/18/2017), 130 mg (11/19/2017), 130  mg (11/20/2017), 130 mg (11/21/2017), 130 mg (12/16/2017), 130 mg (12/17/2017), 130 mg (12/18/2017)  for chemotherapy treatment.         CANCER STAGING: Cancer Staging No matching staging information was found for the patient.   INTERVAL HISTORY:  Janet Fletcher 82 y.o. female returns for follow-up of MDS.  She is 2 weeks status post her second cycle of azacitidine.  Continues to have fatigue which is stable.  Denies any nausea, vomiting or diarrhea.  Occasional dizziness was reported.  Her two daughters are very concerned that she is not eating much.  She started taking Megace 2.5 mg twice daily few days ago.  She had to buy it as her drug plan did not cover it.  She is also drinking about 1 boost per day.  She is not moving much.  Daughters have noted erythema in the gluteal area.  They are concerned that she might develop bedsores.    REVIEW OF SYSTEMS:  Review of Systems  Constitutional: Positive for appetite change and fatigue.  Neurological: Positive for dizziness.  All other systems reviewed and are negative.    PAST MEDICAL/SURGICAL HISTORY:  Past Medical History:  Diagnosis Date  . Allergy   . Cancer (  [place])    lung  . Hyperlipidemia   . Iron deficiency anemia 12/15/2015  . Low vitamin B12 level 12/15/2015  . Osteopenia    Past Surgical History:  Procedure Laterality Date  . CATARACT EXTRACTION, BILATERAL Bilateral   . COLONOSCOPY N/A 12/06/2016   Procedure: COLONOSCOPY;  Surgeon: Danie Binder, MD;  Location: [facility];  Service: Endoscopy;  Laterality: N/A;  1:45pm  . ESOPHAGOGASTRODUODENOSCOPY N/A 12/06/2016   Procedure: ESOPHAGOGASTRODUODENOSCOPY (EGD);  Surgeon: Danie Binder, MD;  Location: [facility];  Service: Endoscopy;  Laterality: N/A;  . MINOR EXCISION EAR CANAL CYST Left    30+ years ago  . SMALL INTESTINE SURGERY    . VIDEO BRONCHOSCOPY WITH ENDOBRONCHIAL ULTRASOUND N/A 12/11/2015   Procedure: VIDEO BRONCHOSCOPY WITH ENDOBRONCHIAL ULTRASOUND;  Surgeon:  Melrose Nakayama, MD;  Location: [facility];  Service: Thoracic;  Laterality: N/A;     SOCIAL HISTORY:  Social History   Socioeconomic History  . Marital status: Married    Spouse name: Not on file  . Number of children: Not on file  . Years of education: Not on file  . Highest education level: Not on file  Occupational History  . Not on file  Social Needs  . Financial resource strain: Not on file  . Food insecurity:    Worry: Not on file    Inability: Not on file  . Transportation needs:    Medical: Not on file    Non-medical: Not on file  Tobacco Use  . Smoking status: Former Smoker    Packs/day: 0.25    Years: 50.00    Pack years: 12.50    Last attempt to quit: 01/08/2016    Years since quitting: 1.9  . Smokeless tobacco: Never Used  Substance and Sexual Activity  . Alcohol use: No  . Drug use: No  . Sexual activity: Not Currently  Lifestyle  . Physical activity:    Days per week: Not on file    Minutes per session: Not on file  . Stress: Not on file  Relationships  . Social connections:    Talks on phone: Not on file    Gets together: Not on file    Attends religious service: Not on file    Active member of club or organization: Not on file    Attends meetings of clubs or organizations: Not on file    Relationship status: Not on file  . Intimate partner violence:    Fear of current or ex partner: Not on file    Emotionally abused: Not on file    Physically abused: Not on file    Forced sexual activity: Not on file  Other Topics Concern  . Not on file  Social History Narrative  . Not on file    FAMILY HISTORY:  Family History  Problem Relation Age of Onset  . Cancer Mother   . Hypertension Father   . Colon cancer Neg Hx     CURRENT MEDICATIONS:  Outpatient Encounter Medications as of 12/30/2017  Medication Sig  . cyanocobalamin (,VITAMIN B-12,) 1000 MCG/ML injection Inject 1 mL (1,000 mcg total) into the muscle every 30 (thirty) days.  Marland Kitchen docusate  sodium (COLACE) 100 MG capsule Take 100 mg by mouth daily.  Marland Kitchen donepezil (ARICEPT) 10 MG tablet Take 1 tablet (10 mg total) by mouth at bedtime.  . dronabinol (MARINOL) 2.5 MG capsule Take 1 capsule (2.5 mg total) by mouth 2 (two) times daily before a meal.  .  memantine (NAMENDA) 5 MG tablet Take 1 tablet (5 mg total) by mouth 2 (two) times daily.  . ondansetron (ZOFRAN) 8 MG tablet Take 1 tablet (8 mg total) by mouth 2 (two) times daily. Start on the third day from the last day of chemotherapy  . prochlorperazine (COMPAZINE) 10 MG tablet Take 1 tablet (10 mg total) by mouth every 6 (six) hours as needed for nausea or vomiting.   No facility-administered encounter medications on file as of 12/30/2017.     ALLERGIES:  Allergies  Allergen Reactions  . Flagyl [Metronidazole Hcl] Nausea Only  . Penicillins Other (See Comments)    Has patient had a PCN reaction causing immediate rash, facial/tongue/throat swelling, SOB or lightheadedness with hypotension: unknown Has patient had a PCN reaction causing severe rash involving mucus membranes or skin necrosis: unknown Has patient had a PCN reaction that required hospitalization unknown Has patient had a PCN reaction occurring within the last 10 years: unknown  If all of the above answers are "NO", then may proceed with Cephalosporin use.     PHYSICAL EXAM:  ECOG Performance status: 2.  Vitals:   12/30/17 0917  BP: 96/68  Pulse: 82  Resp: 16  Temp: (!) 95 F (35 C)   Filed Weights   12/30/17 0917  Weight: 134 lb (60.8 kg)    Physical Exam HEENT: No erythema or mucositis or thrush. Chest: Bilaterally clear to auscultation. Cardiovascular: S1-S2 regular rate and rhythm. Extremities: No edema or cyanosis.  LABORATORY DATA:  I have reviewed the labs as listed.  CBC    Component Value Date/Time   WBC 2.9 (L) 12/30/2017 0825   RBC 3.09 (L) 12/30/2017 0825   HGB 9.2 (L) 12/30/2017 0825   HCT 28.8 (L) 12/30/2017 0825   PLT 85 (L)  12/30/2017 0825   MCV 93.2 12/30/2017 0825   MCV 71.9 (A) 08/20/2013 1559   MCH 29.8 12/30/2017 0825   MCHC 31.9 12/30/2017 0825   RDW 14.3 12/30/2017 0825   LYMPHSABS 0.3 (L) 12/30/2017 0825   MONOABS 0.1 12/30/2017 0825   EOSABS 0.1 12/30/2017 0825   BASOSABS 0.0 12/30/2017 0825   CMP Latest Ref Rng & Units 12/30/2017 12/16/2017 12/03/2017  Glucose 65 - 99 mg/dL 123(H) 136(H) 116(H)  BUN 6 - 20 mg/dL 22(H) 31(H) 29(H)  Creatinine 0.44 - 1.00 mg/dL 1.12(H) 1.34(H) 1.29(H)  Sodium 135 - 145 mmol/L 142 140 139  Potassium 3.5 - 5.1 mmol/L 3.7 3.6 3.9  Chloride 101 - 111 mmol/L 107 105 105  CO2 22 - 32 mmol/L 27 27 28   Calcium 8.9 - 10.3 mg/dL 9.2 9.1 8.7(L)  Total Protein 6.5 - 8.1 g/dL 6.4(L) 7.0 6.5  Total Bilirubin 0.3 - 1.2 mg/dL 0.9 0.8 1.2  Alkaline Phos 38 - 126 U/L 63 70 57  AST 15 - 41 U/L 20 25 24   ALT 14 - 54 U/L 12(L) 16 14        ASSESSMENT & PLAN:   MDS (myelodysplastic syndrome), high grade (HCC) 1. t-MDS, high risk by IPSS-Janet: Bone marrow aspiration and biopsy done on 10/03/2017 shows erythroid hyperplasia, increased in erythroid precursors with atypia (less than 10%) in the form of nuclear irregularities, nuclear budding and multi nucleation.  Megakaryocytes are increased with frequent small, hyperlobulated and hyperchromatic forms.  Granulocytic precursors have no significant dysplasia. 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 -She received a total of 5 points based on revised IPSS system,  with a median survival in the absence of therapy of 1.6 years, 25% AML progression in the absence of therapy and 1.4 years.  She has received chemotherapy with carboplatin and etoposide in 2017.  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  She has been receiving Aranesp for more than a year, dose increased to 200 mcg every 2 weeks in December 2019.  We have given  Aranesp at last visit a week ago.  She completely lost response to erythropoiesis stimulating agents. - Azacitidine 75 mg/m x 5 days every 28 days cycle 1 started on 11/17/2017 - Received 1 unit of blood transfusion for hemoglobin of 7 on 11/17/2017, 2 units of blood transfusion on 12/03/2017, on cycle 1 day 14. - Cycle 2 of azacitidine 75 mg/m2 x4 days on 12/16/2017.  She reports that she has not had any nausea vomiting or diarrhea.  Today her hemoglobin has improved to 9.2.  Platelet count is 85.  No transfusion necessary.  She will come back in 2 weeks to start her cycle 3.  Her fatigue has been stable.  However her daughters are concerned that she is not moving much.  They have noticed some erythema in her gluteal area from sitting.  We will make a referral to physical therapy for chemotherapy-induced fatigue.  2.  Weight loss: She lost some weight in the last few weeks.  We have started her on Marinol 2.5 mg twice daily at her last visit 2 weeks ago.  As her insurance did not cover it, she had to pay out of pocket and took only 30 pills.  Family not sure if it helped with appetite.  However her weight went up by 2 pounds.  3.  Small cell lung cancer: She has completed chemoradiation therapy around July 2017.  Her last CT scan was in December 2018.  We will plan to repeat CT scan soon.      Orders placed this encounter:  No orders of the defined types were placed in this encounter.     Derek Jack, MD [place] [phone]

## 2017-12-30 NOTE — Assessment & Plan Note (Signed)
1. 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  Megakaryocytes are increased with frequent small, hyperlobulated and hyperchromatic forms.  Granulocytic precursors have no significant dysplasia. -Chromosome analysis shows 61, XX, del(3)(q21q29),add(7)(p15),del(13)(q12q14)[6]/46,XX[14] -FISH results show 5 q-, loss of chromosome 7, and trisomy 8. -She received a total of 5 points based on revised IPSS system, with a median survival in the absence of therapy of 1.6 years, 25% AML progression in the absence of therapy and 1.4 years.  She has received chemotherapy with carboplatin and etoposide in 2017.  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  She has been receiving Aranesp for more than a year, dose increased to 200 mcg every 2 weeks in December 2019.  We have given Aranesp at last visit a week ago.  She completely lost response to erythropoiesis stimulating agents. - Azacitidine 75 mg/m x 5 days every 28 days cycle 1 started on 11/17/2017 - Received 1 unit of blood transfusion for hemoglobin of 7 on 11/17/2017, 2 units of blood transfusion on 12/03/2017, on cycle 1 day 14. - Cycle 2 of azacitidine 75 mg/m2 x4 days on 12/16/2017.  She reports that she has not had any nausea vomiting or diarrhea.  Today her hemoglobin has improved to 9.2.  Platelet count is 85.  No transfusion necessary.  She will come back in 2 weeks to start her cycle 3.  Her fatigue has been stable.  However her daughters are concerned that she is not moving much.  They have noticed some erythema in her gluteal area from sitting.  We will make a referral to physical therapy for chemotherapy-induced fatigue.  2.  Weight loss: She lost some weight in the last few weeks.  We have started her on Marinol 2.5 mg twice daily at her last visit  2 weeks ago.  As her insurance did not cover it, she had to pay out of pocket and took only 30 pills.  Family not sure if it helped with appetite.  However her weight went up by 2 pounds.  3.  Small cell lung cancer: She has completed chemoradiation therapy around July 2017.  Her last CT scan was in December 2018.  We will plan to repeat CT scan soon.

## 2017-12-30 NOTE — Progress Notes (Signed)
Nutrition Assessment  Reason for Assessment: Consult (Weight loss)  ASSESSMENT: 82 y/o female PMHx HLD, and SCLC -dx in 2017, successfully treated w/ chemoradiation. Still without signs of new/progressive disease. However, developed pancytopenia this past Feb and ultimately a Bone marrow aspirate biopsy showed myelodysplastic syndrome, thought related to her past chemotherapy regimen as a result. Started chemo 4/29  Patient is known to RD from her tx of [place] 2 years prior. Unfortunately, she had been a difficult patient due to always providing minimal history/information and the information she did give was nearly always contradicted by her family; she always reported great appetite, drinking <64 oz fluid and >4 Ensures/day, but family reported eating/drinking essentially nothing. Ultimately, it was believed she had minimal intake given her degree of wt loss in spite of her report of eating/drinking well; she lost from 132 to 111 during [place] tx.   Per chart, she actually regained all of her weight, and more, once her tx ended. Gaining to 147 this past March. However, she now again displays gradual wt loss she has lost 14 lbs since then  Today, patient seen with family. The nearly identical situation presents; the patient reports having a good appetite and drinking well, but daughters say this is blatantly false. Daughters state pt "eats like a baby". She will put a bit of food in her mouth and "chew it forever" She will take the smallest sips of fluid. They note it takes her "all day to drink one glass of fluid".  Daughters state. "its like she is just playing with her food". They report needing to consistently pressure her hard to eat/drink and "if we arent there, she does not eat or drink".  Due to minimal intake, she was placed on marinol. The daughters "are not quite sure" if this has had any positive impact. When patient asked same question, she says "I havent thought about it".   They strongly  feel her minimal intake is not related to feeling poor, rather, "she is just stubborn". Daughters deny pt having any n/v/d. She is constipated recently, which is thought to be due to minimal fluids. She uses all her energy just having a bowel movement. She is fatigued and "nods on and off all day". Today, she requires a wheelchair, when she had been ambulatory up until now  She has been supplementing diet with Ensure. Historically, patient has reported drinking 3-4/day, but daughters state they struggle to only ger her to drink one.   RD notes she has been started on aricept and namenda in past month Due to "memory changes".  Wt Readings from Last 10 Encounters:  12/30/17 134 lb (60.8 kg)  12/30/17 134 lb (60.8 kg)  12/16/17 132 lb (59.9 kg)  11/28/17 139 lb 9.6 oz (63.3 kg)  11/27/17 137 lb (62.1 kg)  11/21/17 142 lb 3.2 oz (64.5 kg)  11/17/17 142 lb 11.2 oz (64.7 kg)  10/28/17 138 lb (62.6 kg)  10/27/17 140 lb (63.5 kg)  10/20/17 140 lb (63.5 kg)   NUTRITION - FOCUSED PHYSICAL EXAM: Unable to assess due to heavy clothes/hat on.   MEDICATIONS: Marinol, Namenda, Aricept, zofran, B12, compazine, colace Chemo: Azacitidine  Recent Labs  Lab 12/30/17 0825  NA 142  K 3.7  CL 107  CO2 27  BUN 22*  CREATININE 1.12*  CALCIUM 9.2  GLUCOSE 123*   LABS:  Pancytopenia,  BUN/CReat: 22/1.12- improved from 31/1.34 2 wks ago.  Albumin: 3.4-improved from 2 wks ago.   ANTHROPOMETRICS: Height: '5\' 4"'  (  162.6 cm) Weight:134 lb (60.8 kg) KPV:VZSM mass index is 23 kg/m. IBW:120 lbs or 54.54 kg  Wt change- was 147 lbs in march. sig loss  ESTIMATED ENERGY NEEDS:  Kcal:1800-2000 (30-33 kcal/kg bw) Protein:80-90 g Pro (1.3-1.5 g/kg bw) Fluid:1.8-2 L ( 11m/kcal)  NUTRITION DIAGNOSIS:  Severe Malnutrition related to chronic illness, poor appetite, lethargy/confusion, cancer and cancer related treatments as evidenced by loss of 8.8% in 3 months and an estimated energy intake that has met </=  to 75% of needs for >/= to 1 month  DOCUMENTATION CODES:  Severe malnutrition in context of chronic illness  INTERVENTION:  As noted above, it is extremely difficult to determine patients actual level of intake and what exactly is causing patient not to eat. She is able to relay essentially no information and all info is disputed by daughters. Noted recent start of namenda/aricept-dementia? Pt reports of "playing with food/minimal intake" is common sign of dementia.   Recalled with patient what occurred 2 years ago; how she lost >15 lbs and struggled to maintain her weight. Explained we would like to prevent this this time as malnutrition can lead to dosage decreases of medication and poorer outcomes.   Unsure if marinol has had any impact. Reiterated to patient that, even if she does not have an appetite, she needs to eat. Explained that annorexia is related to physiological dysfunction in a cancer state and she will most often not have an appetite. This is par for the course. However, she still needs to eat/drink. She cannot rely on hunger/thirst cues. She notes understanding.   Daughters are convinced patient is only stubborn and note they need to coax her consistently to eat. If she is only eating when they are present, RD recommended trying to be around her as often as possible  Patient does not sound to be suffering from any n/v/d. Has some constipation, may be hydration related. We reviewed fluid diet recall. She is not drinking enough.  She sleeps the majority of the day, may be related to severe fatigue in pancytopenia. Emphasized small, frequent meals in between naps.   RD gave handout titled "Making the Most of Each Bite". Gave coupons for Ensure/Boost  Given pt is being seen for a new cancer and not one she was diagnosed with and treated for in the past, feel she is again eligible for ensure. They were interested in receiving ensure assistance again. RD gave case of Ensure today. She  is eligible for two more.   RD is able to offer limited assistance, given the fact her poor intake is thought most likely related to memory issues vs noncompliance with recommendations, rather than lack of education  GOAL:  Patient will meet greater than or equal to 90% of their needs  MONITOR:  PO intake, Weight trends, Labs  Next Visit: 6/28 by alternate RD  NBurtis JunesRD, LDN, CNSC Clinical Nutrition Available Tues-Sat via Pager: [phone]/05/2018 10:17 AM

## 2018-01-12 ENCOUNTER — Inpatient Hospital Stay (HOSPITAL_COMMUNITY): Payer: Medicare Other

## 2018-01-12 ENCOUNTER — Other Ambulatory Visit: Payer: Self-pay

## 2018-01-12 ENCOUNTER — Encounter (HOSPITAL_COMMUNITY): Payer: Self-pay | Admitting: Internal Medicine

## 2018-01-12 ENCOUNTER — Inpatient Hospital Stay (HOSPITAL_BASED_OUTPATIENT_CLINIC_OR_DEPARTMENT_OTHER): Payer: Medicare Other | Admitting: Internal Medicine

## 2018-01-12 ENCOUNTER — Ambulatory Visit (HOSPITAL_COMMUNITY)
Admission: RE | Admit: 2018-01-12 | Discharge: 2018-01-12 | Disposition: A | Discharge status: Home / Self Care | Payer: Medicare Other | Source: Ambulatory Visit | Attending: Internal Medicine | Admitting: Internal Medicine

## 2018-01-12 VITALS — BP 109/54 | HR 76 | Temp 97.9°F | Resp 18 | Wt 133.3 lb

## 2018-01-12 VITALS — BP 96/42 | HR 80 | Temp 97.6°F | Resp 18

## 2018-01-12 DIAGNOSIS — D46Z Other myelodysplastic syndromes: Secondary | ICD-10-CM

## 2018-01-12 DIAGNOSIS — D649 Anemia, unspecified: Secondary | ICD-10-CM

## 2018-01-12 DIAGNOSIS — D696 Thrombocytopenia, unspecified: Secondary | ICD-10-CM

## 2018-01-12 DIAGNOSIS — C3491 Malignant neoplasm of unspecified part of right bronchus or lung: Secondary | ICD-10-CM

## 2018-01-12 DIAGNOSIS — Z87891 Personal history of nicotine dependence: Secondary | ICD-10-CM

## 2018-01-12 DIAGNOSIS — Z5111 Encounter for antineoplastic chemotherapy: Secondary | ICD-10-CM | POA: Diagnosis not present

## 2018-01-12 DIAGNOSIS — D469 Myelodysplastic syndrome, unspecified: Secondary | ICD-10-CM

## 2018-01-12 DIAGNOSIS — J449 Chronic obstructive pulmonary disease, unspecified: Secondary | ICD-10-CM | POA: Diagnosis not present

## 2018-01-12 DIAGNOSIS — J439 Emphysema, unspecified: Secondary | ICD-10-CM | POA: Diagnosis not present

## 2018-01-12 DIAGNOSIS — J90 Pleural effusion, not elsewhere classified: Secondary | ICD-10-CM | POA: Insufficient documentation

## 2018-01-12 DIAGNOSIS — Z9289 Personal history of other medical treatment: Secondary | ICD-10-CM

## 2018-01-12 DIAGNOSIS — J984 Other disorders of lung: Secondary | ICD-10-CM | POA: Insufficient documentation

## 2018-01-12 LAB — CBC WITH DIFFERENTIAL/PLATELET
BASOS ABS: 0 10*3/uL (ref 0.0–0.1)
Basophils Relative: 1 %
Eosinophils Absolute: 0.1 10*3/uL (ref 0.0–0.7)
Eosinophils Relative: 2 %
HEMATOCRIT: 25.6 % — AB (ref 36.0–46.0)
HEMOGLOBIN: 8.1 g/dL — AB (ref 12.0–15.0)
LYMPHS PCT: 16 %
Lymphs Abs: 0.5 10*3/uL — ABNORMAL LOW (ref 0.7–4.0)
MCH: 30.2 pg (ref 26.0–34.0)
MCHC: 31.6 g/dL (ref 30.0–36.0)
MCV: 95.5 fL (ref 78.0–100.0)
MONO ABS: 0.1 10*3/uL (ref 0.1–1.0)
Monocytes Relative: 2 %
NEUTROS ABS: 2.5 10*3/uL (ref 1.7–7.7)
Neutrophils Relative %: 79 %
Platelets: 107 10*3/uL — ABNORMAL LOW (ref 150–400)
RBC: 2.68 MIL/uL — AB (ref 3.87–5.11)
RDW: 15.9 % — ABNORMAL HIGH (ref 11.5–15.5)
WBC: 3.2 10*3/uL — AB (ref 4.0–10.5)

## 2018-01-12 LAB — COMPREHENSIVE METABOLIC PANEL
ALK PHOS: 63 U/L (ref 38–126)
ALT: 12 U/L — AB (ref 14–54)
AST: 21 U/L (ref 15–41)
Albumin: 3.4 g/dL — ABNORMAL LOW (ref 3.5–5.0)
Anion gap: 7 (ref 5–15)
BUN: 30 mg/dL — AB (ref 6–20)
CO2: 28 mmol/L (ref 22–32)
CREATININE: 1.19 mg/dL — AB (ref 0.44–1.00)
Calcium: 8.9 mg/dL (ref 8.9–10.3)
Chloride: 106 mmol/L (ref 101–111)
GFR, EST AFRICAN AMERICAN: 49 mL/min — AB (ref >60)
GFR, EST NON AFRICAN AMERICAN: 42 mL/min — AB (ref >60)
Glucose, Bld: 112 mg/dL — ABNORMAL HIGH (ref 65–99)
Potassium: 4 mmol/L (ref 3.5–5.1)
Sodium: 141 mmol/L (ref 135–145)
Total Bilirubin: 0.8 mg/dL (ref 0.3–1.2)
Total Protein: 6.5 g/dL (ref 6.5–8.1)

## 2018-01-12 LAB — SAMPLE TO BLOOD BANK

## 2018-01-12 MED ORDER — SODIUM CHLORIDE 0.9 % IV SOLN: 10.0000 mg | Freq: Once | INTRAVENOUS | Status: DC

## 2018-01-12 MED ORDER — PALONOSETRON HCL INJECTION 0.25 MG/5ML
INTRAVENOUS | Status: AC
  Filled 2018-01-12: qty 5

## 2018-01-12 MED ORDER — DEXAMETHASONE SODIUM PHOSPHATE 10 MG/ML IJ SOLN
10.0000 mg | Freq: Once | INTRAMUSCULAR | Status: AC
  Administered 2018-01-12: 10 mg via INTRAVENOUS

## 2018-01-12 MED ORDER — SODIUM CHLORIDE 0.9 % IV SOLN
Freq: Once | INTRAVENOUS | Status: AC
  Administered 2018-01-12: 13:00:00 via INTRAVENOUS

## 2018-01-12 MED ORDER — DEXAMETHASONE SODIUM PHOSPHATE 10 MG/ML IJ SOLN
INTRAMUSCULAR | Status: AC
  Filled 2018-01-12: qty 1

## 2018-01-12 MED ORDER — SODIUM CHLORIDE 0.9 % IV SOLN
75.0000 mg/m2 | Freq: Once | INTRAVENOUS | Status: AC
  Administered 2018-01-12: 130 mg via INTRAVENOUS
  Filled 2018-01-12: qty 13

## 2018-01-12 MED ORDER — PALONOSETRON HCL INJECTION 0.25 MG/5ML
0.2500 mg | Freq: Once | INTRAVENOUS | Status: AC
  Administered 2018-01-12: 0.25 mg via INTRAVENOUS

## 2018-01-12 NOTE — Patient Instructions (Signed)
[facility] at [facility] Discharge Instructions  Today you saw Dr. Higgs.    Thank you for choosing  Cancer Center at [facility] to provide your oncology and hematology care.  To afford each patient quality time with our provider, please arrive at least 15 minutes before your scheduled appointment time.   If you have a lab appointment with the Cancer Center please come in thru the  Main Entrance and check in at the main information desk  You need to re-schedule your appointment should you arrive 10 or more minutes late.  We strive to give you quality time with our providers, and arriving late affects you and other patients whose appointments are after yours.  Also, if you no show three or more times for appointments you may be dismissed from the clinic at the providers discretion.     Again, thank you for choosing [facility].  Our hope is that these requests will decrease the amount of time that you wait before being seen by our physicians.       _____________________________________________________________  Should you have questions after your visit to [facility], please contact our office at [phone] between the hours of 8:30 a.m. and 4:30 p.m.  Voicemails left after 4:30 p.m. will not be returned until the following business day.  For prescription refill requests, have your pharmacy contact our office.       Resources For Cancer Patients and their Caregivers ? American Cancer Society: Can assist with transportation, wigs, general needs, runs Look Good Feel Better.        1-888-227-6333 ? Cancer Care: Provides financial assistance, online support groups, medication/co-pay assistance.  1-800-813-HOPE (4673) ? [facility] Assists [place] cancer patients and their families through emotional , educational and financial support.  336-427-4357 ? [place] DSS Where to apply for  food stamps, Medicaid and utility assistance. 336-342-1394 ? RCATS: Transportation to medical appointments. 336-347-2287 ? Social Security Administration: May apply for disability if have a Stage IV cancer. 336-342-7796 1-800-772-1213 ? [place] Aging, Disability and Transit Services: Assists with nutrition, care and transit needs. 336-349-2343  Cancer Center Support Programs:   > Cancer Support Group  2nd Tuesday of the month 1pm-2pm, Journey Room   > Creative Journey  3rd Tuesday of the month 1130am-1pm, Journey Room    

## 2018-01-13 ENCOUNTER — Ambulatory Visit (INDEPENDENT_AMBULATORY_CARE_PROVIDER_SITE_OTHER): Payer: Medicare Other

## 2018-01-13 ENCOUNTER — Inpatient Hospital Stay (HOSPITAL_COMMUNITY): Payer: Medicare Other

## 2018-01-13 ENCOUNTER — Encounter (HOSPITAL_COMMUNITY): Payer: Self-pay | Admitting: Lab

## 2018-01-13 VITALS — BP 90/41 | HR 88 | Temp 98.6°F | Resp 16 | Wt 133.6 lb

## 2018-01-13 DIAGNOSIS — M6281 Muscle weakness (generalized): Secondary | ICD-10-CM

## 2018-01-13 DIAGNOSIS — C3491 Malignant neoplasm of unspecified part of right bronchus or lung: Secondary | ICD-10-CM

## 2018-01-13 DIAGNOSIS — E43 Unspecified severe protein-calorie malnutrition: Secondary | ICD-10-CM | POA: Diagnosis not present

## 2018-01-13 DIAGNOSIS — R262 Difficulty in walking, not elsewhere classified: Secondary | ICD-10-CM

## 2018-01-13 DIAGNOSIS — Z5111 Encounter for antineoplastic chemotherapy: Secondary | ICD-10-CM | POA: Diagnosis not present

## 2018-01-13 DIAGNOSIS — D46Z Other myelodysplastic syndromes: Secondary | ICD-10-CM

## 2018-01-13 MED ORDER — SODIUM CHLORIDE 0.9 % IV SOLN
Freq: Once | INTRAVENOUS | Status: AC
  Administered 2018-01-13: 12:00:00 via INTRAVENOUS

## 2018-01-13 MED ORDER — SODIUM CHLORIDE 0.9 % IV SOLN: 10.0000 mg | Freq: Once | INTRAVENOUS | Status: DC

## 2018-01-13 MED ORDER — DEXAMETHASONE SODIUM PHOSPHATE 10 MG/ML IJ SOLN
INTRAMUSCULAR | Status: AC
  Filled 2018-01-13: qty 1

## 2018-01-13 MED ORDER — SODIUM CHLORIDE 0.9 % IV SOLN
75.0000 mg/m2 | Freq: Once | INTRAVENOUS | Status: AC
  Administered 2018-01-13: 130 mg via INTRAVENOUS
  Filled 2018-01-13: qty 13

## 2018-01-13 MED ORDER — SODIUM CHLORIDE 0.9% FLUSH
10.0000 mL | INTRAVENOUS | Status: DC | PRN
  Administered 2018-01-13: 10 mL
  Filled 2018-01-13: qty 10

## 2018-01-13 MED ORDER — DEXAMETHASONE SODIUM PHOSPHATE 10 MG/ML IJ SOLN
10.0000 mg | Freq: Once | INTRAMUSCULAR | Status: AC
  Administered 2018-01-13: 10 mg via INTRAVENOUS
  Filled 2018-01-13: qty 1

## 2018-01-13 NOTE — Progress Notes (Unsigned)
Referral sent to Dr Luan Pulling for COPD.  Records faxed on 6/25

## 2018-01-13 NOTE — Progress Notes (Signed)
Treatment given per orders. Patient tolerated it well without problems. Vitals stable and discharged home from clinic ambulatory. Follow up as scheduled.  

## 2018-01-13 NOTE — Progress Notes (Signed)
Diagnosis MDS (myelodysplastic syndrome), high grade ([place]) - Plan: DG Chest 2 View, CBC with Differential/Platelet, Comprehensive metabolic panel  Small cell carcinoma of right lung ([place]) - Plan: DG Chest 2 View, CBC with Differential/Platelet, Comprehensive metabolic panel  Staging Cancer Staging No matching staging information was found for the patient.  Assessment and Plan: 1.   MDS (myelodysplastic syndrome), high grade ([place])  Pt is followed by Dr. Worthy Keeler.  She has  t-MDS, high risk by IPSS-R:   Historical data obtained from note done 12/30/2017:   Bone marrow aspiration and biopsy done on 10/03/2017 shows erythroid hyperplasia, increased in erythroid precursors with atypia (less than 10%) in the form of nuclear irregularities, nuclear budding and multi nucleation.  Megakaryocytes are increased with frequent small, hyperlobulated and hyperchromatic forms.  Granulocytic precursors have no significant dysplasia. 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 -She received a total of 5 points based on revised IPSS system, with a median survival in the absence of therapy of 1.6 years, 25% AML progression in the absence of therapy and 1.4 years.  She has received chemotherapy with carboplatin and etoposide in 2017.  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  She has been receiving Aranesp for more than a year, dose increased to 200 mcg every 2 weeks in December 2019. She completely lost response to erythropoiesis stimulating agents.  Pt is here today for follow-up and for evaluation prior to Azacitadine.  Labs done 01/12/2018 reviewed with pt and showed WBC 3.2 HB 8.1 Plts 107,000.  She will proceed with therapy.  Will repeat labs in 1 week.   She should notify the office if any worsening of symptoms prior to next visit.  She will follow-up with Dr. Worthy Keeler in 4 weeks prior to next  cycle.    2.  Cough/SOB.  Pulse ox is 97% on room air.  CXR was done today 01/12/2018 and showed scarring and emphysema.  Will set up with pulmonary to help with management of COPD.    3.  Small cell lung cancer: She has completed chemoradiation therapy around July 2017.  Her last CT scan was in December 2018.  Pt will have repeat imaging in 06/2018.     4.  Anemia.  HB is 8.1.  Will repeat labs in 1 week after completion of this cycle of Azacitadine.    5.  Thrombocytopenia.  Plt count is 107,000.  Will repeat labs in 1 week after Azacitadine.    Current Status:  Pt is seen today for follow-up prior to Azacitadine.  Family member is reporting pt has increasing cough and SOB.      Small cell carcinoma of right lung ([place])   11/28/2015 Imaging    Large anterior mediastinal mass in continuity with a R hilar mass, narrowing of SVC, abnormal densities in RUL, 9 mm spiculated nodule, nonspec. hypodensity in liver      12/05/2015 PET scan    Large hypermetabolic paratracheal mass c/w SCLC, perihilar nodular densities in RML, mild metabolic activity RUL nodule, no distant metastatic disease      12/11/2015 Procedure    Video bronch with biopsies and brushings, endobronchial ultrasound with mediastinal LN aspiration. Dr. Roxan Hockey      12/11/2015 Pathology Results    Trachea biopsy negative, FNA RUL malignant cells c/w SCLC      12/13/2015 - 03/15/2016 Chemotherapy    Cisplatin/Etoposide x 5 cycles.  Cycle #6 cancelled due to significant  cytopenias      12/25/2015 Imaging    MRI brain- No intracranial parenchymal enhancing lesion. Tiny right frontal calvarial enhancing lesion. Small metastatic lesion not excluded although this may represent an incidentally detected benign process.      12/29/2015 - 02/13/2016 Radiation Therapy    XRT      02/14/2016 Treatment Plan Change    Chemotherapy deferred x 1 week      02/21/2016 Treatment Plan Change    Carboplatin dose reduced by 20% and Etoposide  dose reduced by 10%.      04/24/2016 Imaging    CT CAP- Chest Impression:  1. Marked reduction in mediastinal and RIGHT hilar lymphadenopathy with no residual pathologically enlarged nodes. 2. Resolution of perihilar nodularity in the RIGHT lung which was hypermetabolic on FDG PET. 3. Persistent nodule in the RIGHT upper lobe which is not hypermetabolic. 4. New ground-glass nodule in the RIGHT upper lobe is nonspecific and may relate to therapy.  Abdomen / Pelvis Impression:  1. No evidence of metastatic disease in the abdomen pelvis. 2. Atherosclerotic calcification of the aorta.      04/24/2016 Imaging    Bone scan- Nonspecific increased tracer localization at approximately T11 vertebral body; this is of uncertain etiology, could represent a degenerative process though metastatic disease is not excluded.      04/29/2016 -  Radiation Therapy    PCI -- 25 Gy      07/24/2016 Imaging    CT CAP- 1. There has been interval decrease in size of mediastinal lymph nodes. The index lesion within the right upper lobe is also decreased in size from previous exam. 2. New subpleural nodule is noted within the posteromedial right lower lobe. Attention on follow-up imaging advised. 3. Stable ground-glass attenuating nodule within the right lower lobe and anteromedial left upper lobe 4. Aortic atherosclerosis and coronary artery calcification      07/29/2016 Imaging    MRI t-spine: 1. Stable benign slightly enhancing sclerotic lesion in the right posterolateral aspect of the T11 vertebral body, unchanged for 5 years. This is felt to account for the subtle increased activity at T11 on the SPECT bone scan. 2. Slight degenerative disc disease with small disc protrusions at T1-2 and T2-3 and T12-L1 with no neural impingement. 3. No evidence of metastatic disease.      09/23/2016 Imaging    CT chest- Radiation changes in the central right upper lobe/ perihilar region.  Stable small  mediastinal lymph nodes, unchanged.  No findings suspicious for new/progressive metastatic disease.      12/25/2016 Imaging    CT C/A/P: IMPRESSION: 1. Continued further decrease in mediastinal lymph nodes. 2. Index nodule antero medial right upper lobe has decreased. The new 5 mm right lower lobe subpleural nodule seen on the previous study has resolved in the interval. Ground-glass nodules in the upper lobes bilaterally are stable. 3. Interval development of interstitial and alveolar opacity in the parahilar right lung is presumably radiation related. Attention on follow-up recommended. 4. New tiny right pleural effusion with areas of apparent pleural enhancement in the posterior right costophrenic sulcus. Close attention on follow-up recommended. 5. Marked abdominal aortic atherosclerosis with likely infrarenal significant stenosis.       07/02/2017 Imaging    CT Chest w/ contrast: Resolution of central right lower lobe infiltrate and right pleural effusion since prior study.  Stable ground-glass opacities in both upper lobes.  No new or progressive disease      11/16/2017 -  Chemotherapy  The patient had palonosetron (ALOXI) injection 0.25 mg, 0.25 mg, Intravenous,  Once, 3 of 4 cycles Administration: 0.25 mg (11/17/2017), 0.25 mg (11/19/2017), 0.25 mg (11/21/2017), 0.25 mg (01/12/2018), 0.25 mg (12/16/2017), 0.25 mg (12/18/2017) azaCITIDine (VIDAZA) 130 mg in sodium chloride 0.9 % 50 mL chemo infusion, 75 mg/m2 = 130 mg, Intravenous, Once, 3 of 4 cycles Administration: 130 mg (11/17/2017), 130 mg (11/18/2017), 130 mg (11/19/2017), 130 mg (11/20/2017), 130 mg (11/21/2017), 130 mg (12/16/2017), 130 mg (12/17/2017), 130 mg (12/18/2017), 130 mg (12/19/2017), 130 mg (01/12/2018), 130 mg (01/13/2018)  for chemotherapy treatment.        MDS (myelodysplastic syndrome), high grade (HCC)   10/20/2017 Initial Diagnosis    MDS (myelodysplastic syndrome), high grade ([place])      11/16/2017 -   Chemotherapy    The patient had palonosetron (ALOXI) injection 0.25 mg, 0.25 mg, Intravenous,  Once, 3 of 4 cycles Administration: 0.25 mg (11/17/2017), 0.25 mg (11/19/2017), 0.25 mg (11/21/2017), 0.25 mg (01/12/2018), 0.25 mg (12/16/2017), 0.25 mg (12/18/2017) azaCITIDine (VIDAZA) 130 mg in sodium chloride 0.9 % 50 mL chemo infusion, 75 mg/m2 = 130 mg, Intravenous, Once, 3 of 4 cycles Administration: 130 mg (11/17/2017), 130 mg (11/18/2017), 130 mg (11/19/2017), 130 mg (11/20/2017), 130 mg (11/21/2017), 130 mg (12/16/2017), 130 mg (12/17/2017), 130 mg (12/18/2017), 130 mg (12/19/2017), 130 mg (01/12/2018), 130 mg (01/13/2018)  for chemotherapy treatment.         Problem List Patient Active Problem List   Diagnosis Date Noted  . Fatigue [R53.83] 11/27/2017  . MDS (myelodysplastic syndrome), high grade ([place]) [G81.Z] 10/20/2017  . Anemia in chronic kidney disease (CKD) [N18.9, D63.1] 09/20/2017  . Mild memory disturbance [R41.3] 06/05/2017  . Helicobacter pylori gastritis [K29.70, B96.81] 04/03/2017  . Heme + stool [R19.5] 11/07/2016  . Tobacco abuse [Z72.0] 12/15/2015  . Absolute anemia [D64.9] 12/15/2015  . Low vitamin B12 level [E53.8] 12/15/2015  . Iron deficiency anemia [D50.9] 12/15/2015  . Small cell carcinoma of right lung ([place]) [C34.91] 12/07/2015  . 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 Combined forms of age-related cataract of left eye [H25.812] 12/15/2014  . Hyperlipidemia with target LDL less than 100 [E78.5] 09/29/2014  . GAD (generalized anxiety disorder) [F41.1] 09/29/2014    Past Medical History Past Medical History:  Diagnosis Date  . Allergy   . Cancer ([place])    lung  . Hyperlipidemia   . Iron deficiency anemia 12/15/2015  . Low vitamin B12 level 12/15/2015  . Osteopenia     Past Surgical History Past Surgical History:  Procedure Laterality Date  . CATARACT EXTRACTION, BILATERAL Bilateral   . COLONOSCOPY N/A 12/06/2016   Procedure:  COLONOSCOPY;  Surgeon: Danie Binder, MD;  Location: [facility];  Service: Endoscopy;  Laterality: N/A;  1:45pm  . ESOPHAGOGASTRODUODENOSCOPY N/A 12/06/2016   Procedure: ESOPHAGOGASTRODUODENOSCOPY (EGD);  Surgeon: Danie Binder, MD;  Location: [facility];  Service: Endoscopy;  Laterality: N/A;  . MINOR EXCISION EAR CANAL CYST Left    30+ years ago  . SMALL INTESTINE SURGERY    . VIDEO BRONCHOSCOPY WITH ENDOBRONCHIAL ULTRASOUND N/A 12/11/2015   Procedure: VIDEO BRONCHOSCOPY WITH ENDOBRONCHIAL ULTRASOUND;  Surgeon: Melrose Nakayama, MD;  Location: [facility] OR;  Service: Thoracic;  Laterality: N/A;    Family History Family History  Problem Relation Age of Onset  . Cancer Mother   . Hypertension Father   . Colon cancer Neg Hx      Social History  reports that she quit  smoking about 2 years ago. She has a 12.50 pack-year smoking history. She has never used smokeless tobacco. She reports that she does not drink alcohol or use drugs.  Medications  Current Outpatient Medications:  .  cyanocobalamin (,VITAMIN B-12,) 1000 MCG/ML injection, Inject 1 mL (1,000 mcg total) into the muscle every 30 (thirty) days., Disp: 3 mL, Rfl: 1 .  docusate sodium (COLACE) 100 MG capsule, Take 100 mg by mouth daily., Disp: , Rfl:  .  donepezil (ARICEPT) 10 MG tablet, Take 1 tablet (10 mg total) by mouth at bedtime., Disp: 30 tablet, Rfl: 0 .  dronabinol (MARINOL) 2.5 MG capsule, Take 1 capsule (2.5 mg total) by mouth 2 (two) times daily before a meal., Disp: 60 capsule, Rfl: 0 .  memantine (NAMENDA) 5 MG tablet, Take 1 tablet (5 mg total) by mouth 2 (two) times daily., Disp: 180 tablet, Rfl: 1 .  ondansetron (ZOFRAN) 8 MG tablet, Take 1 tablet (8 mg total) by mouth 2 (two) times daily. Start on the third day from the last day of chemotherapy, Disp: 20 tablet, Rfl: 1 .  prochlorperazine (COMPAZINE) 10 MG tablet, Take 1 tablet (10 mg total) by mouth every 6 (six) hours as needed for nausea or vomiting., Disp:  30 tablet, Rfl: 1 No current facility-administered medications for this visit.   Facility-Administered Medications Ordered in Other Visits:  .  sodium chloride flush (NS) 0.9 % injection 10 mL, 10 mL, Intracatheter, PRN, Derek Jack, MD, 10 mL at 01/13/18 1139  Allergies Flagyl [metronidazole hcl] and Penicillins  Review of Systems Review of Systems - Oncology ROS as per HPI otherwise 12 point ROS is negative.   Physical Exam  Vitals Wt Readings from Last 3 Encounters:  01/13/18 133 lb 9.6 oz (60.6 kg)  01/12/18 133 lb 4.8 oz (60.5 kg)  12/30/17 134 lb (60.8 kg)   Temp Readings from Last 3 Encounters:  01/13/18 98.6 F (37 C) (Oral)  01/12/18 97.6 F (36.4 C) (Oral)  01/12/18 97.9 F (36.6 C) (Oral)   BP Readings from Last 3 Encounters:  01/13/18 (!) 90/41  01/12/18 (!) 96/42  01/12/18 (!) 109/54   Pulse Readings from Last 3 Encounters:  01/13/18 88  01/12/18 80  01/12/18 76   Constitutional: Well-developed, well-nourished, and in no distress.   HENT: Head: Normocephalic and atraumatic.  Mouth/Throat: No oropharyngeal exudate. Mucosa moist. Eyes: Pupils are equal, round, and reactive to light. Conjunctivae are normal. No scleral icterus.  Neck: Normal range of motion. Neck supple. No JVD present.  Cardiovascular: Normal rate, regular rhythm and normal heart sounds.  Exam reveals no gallop and no friction rub.   No murmur heard. Pulmonary/Chest: Effort normal and breath sounds normal. No respiratory distress. No wheezes.No rales.  Abdominal: Soft. Bowel sounds are normal. No distension. There is no tenderness. There is no guarding.  Musculoskeletal: No edema or tenderness.  Lymphadenopathy: No cervical, axillary or supraclavicular adenopathy.  Neurological: Alert and oriented to person, place, and time. No cranial nerve deficit.  Skin: Skin is warm and dry. No rash noted. No erythema. No pallor.  Psychiatric: Affect and judgment normal.   Labs Infusion  on 01/12/2018  Component Date Value Ref Range Status  . Sodium 01/12/2018 141  135 - 145 mmol/L Final  . Potassium 01/12/2018 4.0  3.5 - 5.1 mmol/L Final  . Chloride 01/12/2018 106  101 - 111 mmol/L Final  . CO2 01/12/2018 28  22 - 32 mmol/L Final  . Glucose, Bld 01/12/2018 112* 65 -  99 mg/dL Final  . BUN 01/12/2018 30* 6 - 20 mg/dL Final  . Creatinine, Ser 01/12/2018 1.19* 0.44 - 1.00 mg/dL Final  . Calcium 01/12/2018 8.9  8.9 - 10.3 mg/dL Final  . Total Protein 01/12/2018 6.5  6.5 - 8.1 g/dL Final  . Albumin 01/12/2018 3.4* 3.5 - 5.0 g/dL Final  . AST 01/12/2018 21  15 - 41 U/L Final  . ALT 01/12/2018 12* 14 - 54 U/L Final  . Alkaline Phosphatase 01/12/2018 63  38 - 126 U/L Final  . Total Bilirubin 01/12/2018 0.8  0.3 - 1.2 mg/dL Final  . GFR calc non Af Amer 01/12/2018 42* >60 mL/min Final  . GFR calc Af Amer 01/12/2018 49* >60 mL/min Final   Comment: (NOTE) The eGFR has been calculated using the CKD EPI equation. This calculation has not been validated in all clinical situations. eGFR's persistently <60 mL/min signify possible Chronic Kidney Disease.   Georgiann Hahn gap 01/12/2018 7  5 - 15 Final   Performed at [facility], [address] [place], [place] [address]  Appointment on 01/12/2018  Component Date Value Ref Range Status  . Blood Bank Specimen 01/12/2018 SAMPLE AVAILABLE FOR TESTING   Final  . Sample Expiration 01/12/2018    Final                   Value:01/15/2018 Performed at [facility], [address] [place], [place] [address]   . WBC 01/12/2018 3.2* 4.0 - 10.5 K/uL Final  . RBC 01/12/2018 2.68* 3.87 - 5.11 MIL/uL Final  . Hemoglobin 01/12/2018 8.1* 12.0 - 15.0 g/dL Final  . HCT 01/12/2018 25.6* 36.0 - 46.0 % Final  . MCV 01/12/2018 95.5  78.0 - 100.0 fL Final  . MCH 01/12/2018 30.2  26.0 - 34.0 pg Final  . MCHC 01/12/2018 31.6  30.0 - 36.0 g/dL Final  . RDW 01/12/2018 15.9* 11.5 - 15.5 % Final  . Platelets 01/12/2018 107* 150 - 400 K/uL Final   Comment:  SPECIMEN CHECKED FOR CLOTS PLATELET COUNT CONFIRMED BY SMEAR   . Neutrophils Relative % 01/12/2018 79  % Final  . Neutro Abs 01/12/2018 2.5  1.7 - 7.7 K/uL Final  . Lymphocytes Relative 01/12/2018 16  % Final  . Lymphs Abs 01/12/2018 0.5* 0.7 - 4.0 K/uL Final  . Monocytes Relative 01/12/2018 2  % Final  . Monocytes Absolute 01/12/2018 0.1  0.1 - 1.0 K/uL Final  . Eosinophils Relative 01/12/2018 2  % Final  . Eosinophils Absolute 01/12/2018 0.1  0.0 - 0.7 K/uL Final  . Basophils Relative 01/12/2018 1  % Final  . Basophils Absolute 01/12/2018 0.0  0.0 - 0.1 K/uL Final   Performed at [facility], [address], [place], [place] [address]     Pathology Orders Placed This Encounter  Procedures  . DG Chest 2 View    Standing Status:   Future    Number of Occurrences:   1    Standing Expiration Date:   01/12/2019    Order Specific Question:   Reason for Exam (SYMPTOM  OR DIAGNOSIS REQUIRED)    Answer:   cough, MDS and lung cancer    Order Specific Question:   Preferred imaging location?    Answer:   [facility]    Order Specific Question:   Radiology Contrast Protocol - do NOT remove file path    Answer:   [email or web address]  . CBC with Differential/Platelet    Standing Status:   Future  Standing Expiration Date:   01/13/2019  . Comprehensive metabolic panel    Standing Status:   Future    Standing Expiration Date:   01/13/2019       Zoila Shutter MD

## 2018-01-13 NOTE — Patient Instructions (Signed)
[facility] Discharge Instructions for Patients Receiving Chemotherapy   Beginning January 23rd 2017 lab work for the Cancer Center will be done in the  Main lab at [facility] on 1st floor. If you have a lab appointment with the Cancer Center please come in thru the  Main Entrance and check in at the main information desk   Today you received the following chemotherapy agents   To help prevent nausea and vomiting after your treatment, we encourage you to take your nausea medication     If you develop nausea and vomiting, or diarrhea that is not controlled by your medication, call the clinic.  The clinic phone number is [phone]. Office hours are Monday-Friday 8:30am-5:00pm.  BELOW ARE SYMPTOMS THAT SHOULD BE REPORTED IMMEDIATELY:  *FEVER GREATER THAN 101.0 F  *CHILLS WITH OR WITHOUT FEVER  NAUSEA AND VOMITING THAT IS NOT CONTROLLED WITH YOUR NAUSEA MEDICATION  *UNUSUAL SHORTNESS OF BREATH  *UNUSUAL BRUISING OR BLEEDING  TENDERNESS IN MOUTH AND THROAT WITH OR WITHOUT PRESENCE OF ULCERS  *URINARY PROBLEMS  *BOWEL PROBLEMS  UNUSUAL RASH Items with * indicate a potential emergency and should be followed up as soon as possible. If you have an emergency after office hours please contact your primary care physician or go to the nearest emergency department.  Please call the clinic during office hours if you have any questions or concerns.   You may also contact the Patient Navigator at [phone] should you have any questions or need assistance in obtaining follow up care.      Resources For Cancer Patients and their Caregivers ? American Cancer Society: Can assist with transportation, wigs, general needs, runs Look Good Feel Better.        1-888-227-6333 ? Cancer Care: Provides financial assistance, online support groups, medication/co-pay assistance.  1-800-813-HOPE (4673) ? [facility] Assists [place] cancer  patients and their families through emotional , educational and financial support.  336-427-4357 ? [place] DSS Where to apply for food stamps, Medicaid and utility assistance. 336-342-1394 ? RCATS: Transportation to medical appointments. 336-347-2287 ? Social Security Administration: May apply for disability if have a Stage IV cancer. 336-342-7796 1-800-772-1213 ? [place] Aging, Disability and Transit Services: Assists with nutrition, care and transit needs. 336-349-2343         

## 2018-01-14 ENCOUNTER — Inpatient Hospital Stay (HOSPITAL_COMMUNITY): Payer: Medicare Other

## 2018-01-14 VITALS — BP 84/38 | HR 73 | Temp 98.6°F | Resp 16 | Wt 132.0 lb

## 2018-01-14 DIAGNOSIS — D46Z Other myelodysplastic syndromes: Secondary | ICD-10-CM

## 2018-01-14 DIAGNOSIS — C3491 Malignant neoplasm of unspecified part of right bronchus or lung: Secondary | ICD-10-CM

## 2018-01-14 DIAGNOSIS — Z5111 Encounter for antineoplastic chemotherapy: Secondary | ICD-10-CM | POA: Diagnosis not present

## 2018-01-14 MED ORDER — DEXAMETHASONE SODIUM PHOSPHATE 10 MG/ML IJ SOLN
INTRAMUSCULAR | Status: AC
  Filled 2018-01-14: qty 1

## 2018-01-14 MED ORDER — PALONOSETRON HCL INJECTION 0.25 MG/5ML
0.2500 mg | Freq: Once | INTRAVENOUS | Status: AC
  Administered 2018-01-14: 0.25 mg via INTRAVENOUS

## 2018-01-14 MED ORDER — SODIUM CHLORIDE 0.9 % IV SOLN
Freq: Once | INTRAVENOUS | Status: AC
  Administered 2018-01-14: 12:00:00 via INTRAVENOUS

## 2018-01-14 MED ORDER — SODIUM CHLORIDE 0.9 % IV SOLN: 10.0000 mg | Freq: Once | INTRAVENOUS | Status: DC

## 2018-01-14 MED ORDER — SODIUM CHLORIDE 0.9 % IV SOLN
75.0000 mg/m2 | Freq: Once | INTRAVENOUS | Status: AC
  Administered 2018-01-14: 130 mg via INTRAVENOUS
  Filled 2018-01-14: qty 13

## 2018-01-14 MED ORDER — PALONOSETRON HCL INJECTION 0.25 MG/5ML
INTRAVENOUS | Status: AC
  Filled 2018-01-14: qty 5

## 2018-01-14 MED ORDER — DEXAMETHASONE SODIUM PHOSPHATE 10 MG/ML IJ SOLN
10.0000 mg | Freq: Once | INTRAMUSCULAR | Status: AC
  Administered 2018-01-14: 10 mg via INTRAVENOUS

## 2018-01-14 NOTE — Progress Notes (Signed)
Tolerated infusion w/o adverse reaction.  Alert, in no distress.  Discharged via wheelchair in c/o family.

## 2018-01-15 ENCOUNTER — Inpatient Hospital Stay (HOSPITAL_COMMUNITY): Payer: Medicare Other

## 2018-01-15 VITALS — BP 98/50 | HR 86 | Temp 97.8°F | Resp 16

## 2018-01-15 DIAGNOSIS — Z5111 Encounter for antineoplastic chemotherapy: Secondary | ICD-10-CM | POA: Diagnosis not present

## 2018-01-15 DIAGNOSIS — D46Z Other myelodysplastic syndromes: Secondary | ICD-10-CM

## 2018-01-15 DIAGNOSIS — C3491 Malignant neoplasm of unspecified part of right bronchus or lung: Secondary | ICD-10-CM

## 2018-01-15 MED ORDER — SODIUM CHLORIDE 0.9 % IV SOLN: 10.0000 mg | Freq: Once | INTRAVENOUS | Status: DC

## 2018-01-15 MED ORDER — SODIUM CHLORIDE 0.9 % IV SOLN
75.0000 mg/m2 | Freq: Once | INTRAVENOUS | Status: AC
  Administered 2018-01-15: 130 mg via INTRAVENOUS
  Filled 2018-01-15: qty 13

## 2018-01-15 MED ORDER — DEXAMETHASONE SODIUM PHOSPHATE 10 MG/ML IJ SOLN
10.0000 mg | Freq: Once | INTRAMUSCULAR | Status: AC
  Administered 2018-01-15: 10 mg via INTRAVENOUS

## 2018-01-15 MED ORDER — DEXAMETHASONE SODIUM PHOSPHATE 10 MG/ML IJ SOLN
INTRAMUSCULAR | Status: AC
  Filled 2018-01-15: qty 1

## 2018-01-15 MED ORDER — SODIUM CHLORIDE 0.9 % IV SOLN
Freq: Once | INTRAVENOUS | Status: AC
  Administered 2018-01-15: 12:00:00 via INTRAVENOUS

## 2018-01-15 NOTE — Progress Notes (Signed)
Treatment given per orders. Patient tolerated it well without problems. Vitals stable and discharged home from clinic ambulatory. Follow up as scheduled.  

## 2018-01-15 NOTE — Patient Instructions (Signed)
[facility] Discharge Instructions for Patients Receiving Chemotherapy   Beginning January 23rd 2017 lab work for the Cancer Center will be done in the  Main lab at [facility] on 1st floor. If you have a lab appointment with the Cancer Center please come in thru the  Main Entrance and check in at the main information desk   Today you received the following chemotherapy agents   To help prevent nausea and vomiting after your treatment, we encourage you to take your nausea medication     If you develop nausea and vomiting, or diarrhea that is not controlled by your medication, call the clinic.  The clinic phone number is [phone]. Office hours are Monday-Friday 8:30am-5:00pm.  BELOW ARE SYMPTOMS THAT SHOULD BE REPORTED IMMEDIATELY:  *FEVER GREATER THAN 101.0 F  *CHILLS WITH OR WITHOUT FEVER  NAUSEA AND VOMITING THAT IS NOT CONTROLLED WITH YOUR NAUSEA MEDICATION  *UNUSUAL SHORTNESS OF BREATH  *UNUSUAL BRUISING OR BLEEDING  TENDERNESS IN MOUTH AND THROAT WITH OR WITHOUT PRESENCE OF ULCERS  *URINARY PROBLEMS  *BOWEL PROBLEMS  UNUSUAL RASH Items with * indicate a potential emergency and should be followed up as soon as possible. If you have an emergency after office hours please contact your primary care physician or go to the nearest emergency department.  Please call the clinic during office hours if you have any questions or concerns.   You may also contact the Patient Navigator at [phone] should you have any questions or need assistance in obtaining follow up care.      Resources For Cancer Patients and their Caregivers ? American Cancer Society: Can assist with transportation, wigs, general needs, runs Look Good Feel Better.        1-888-227-6333 ? Cancer Care: Provides financial assistance, online support groups, medication/co-pay assistance.  1-800-813-HOPE (4673) ? [facility] Assists [place] cancer  patients and their families through emotional , educational and financial support.  336-427-4357 ? [place] DSS Where to apply for food stamps, Medicaid and utility assistance. 336-342-1394 ? RCATS: Transportation to medical appointments. 336-347-2287 ? Social Security Administration: May apply for disability if have a Stage IV cancer. 336-342-7796 1-800-772-1213 ? [place] Aging, Disability and Transit Services: Assists with nutrition, care and transit needs. 336-349-2343         

## 2018-01-16 ENCOUNTER — Inpatient Hospital Stay (HOSPITAL_COMMUNITY): Payer: Medicare Other

## 2018-01-16 VITALS — BP 100/47 | HR 88 | Temp 97.5°F | Resp 16

## 2018-01-16 DIAGNOSIS — C3491 Malignant neoplasm of unspecified part of right bronchus or lung: Secondary | ICD-10-CM

## 2018-01-16 DIAGNOSIS — Z5111 Encounter for antineoplastic chemotherapy: Secondary | ICD-10-CM | POA: Diagnosis not present

## 2018-01-16 DIAGNOSIS — D46Z Other myelodysplastic syndromes: Secondary | ICD-10-CM

## 2018-01-16 MED ORDER — DEXAMETHASONE SODIUM PHOSPHATE 10 MG/ML IJ SOLN
10.0000 mg | Freq: Once | INTRAMUSCULAR | Status: AC
  Administered 2018-01-16: 10 mg via INTRAVENOUS

## 2018-01-16 MED ORDER — SODIUM CHLORIDE 0.9 % IV SOLN
75.0000 mg/m2 | Freq: Once | INTRAVENOUS | Status: AC
  Administered 2018-01-16: 130 mg via INTRAVENOUS
  Filled 2018-01-16: qty 13

## 2018-01-16 MED ORDER — DEXAMETHASONE SODIUM PHOSPHATE 10 MG/ML IJ SOLN
INTRAMUSCULAR | Status: AC
  Filled 2018-01-16: qty 1

## 2018-01-16 MED ORDER — PALONOSETRON HCL INJECTION 0.25 MG/5ML
0.2500 mg | Freq: Once | INTRAVENOUS | Status: AC
  Administered 2018-01-16: 0.25 mg via INTRAVENOUS

## 2018-01-16 MED ORDER — SODIUM CHLORIDE 0.9 % IV SOLN: 10.0000 mg | Freq: Once | INTRAVENOUS | Status: DC

## 2018-01-16 MED ORDER — PALONOSETRON HCL INJECTION 0.25 MG/5ML
INTRAVENOUS | Status: AC
  Filled 2018-01-16: qty 5

## 2018-01-16 MED ORDER — SODIUM CHLORIDE 0.9 % IV SOLN
Freq: Once | INTRAVENOUS | Status: AC
  Administered 2018-01-16: 10:00:00 via INTRAVENOUS

## 2018-01-16 MED ORDER — SODIUM CHLORIDE 0.9% FLUSH
10.0000 mL | INTRAVENOUS | Status: DC | PRN
  Administered 2018-01-16: 10 mL
  Filled 2018-01-16: qty 10

## 2018-01-16 MED ORDER — HEPARIN SOD (PORK) LOCK FLUSH 100 UNIT/ML IV SOLN
500.0000 [IU] | Freq: Once | INTRAVENOUS | Status: AC | PRN
  Administered 2018-01-16: 500 [IU]
  Filled 2018-01-16: qty 5

## 2018-01-16 NOTE — Progress Notes (Signed)
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    Met with patient and daughter during infusion today.  Patient sleeping most of visit.  Daughter reports that patient usually eats good breakfast of rice, sausage, biscuit but then may not eat again until 3pm.  Typically that is mandarin oranges, pudding.  Supper is usually chicken or some kind of meat, potato/vegetable.  Patient reports that patient's husband is with her 24/7 but her and other family members check on them regularly.  Reports that they do not force her to eat but remind her and fix her something regularly.  Reports that usually what they fix for her she will eat except for few bites.  Reports that she is drinking about 2 ensure per day.    Daughter feels that memory loss is effecting intake as well.  "I think she thinks she has already eaten when she really has not."  Medications: reviewed  Labs: reviewed  Anthropometrics:   Noted weight 132 lb on 6/26 decreased from 134 lb on 6/11   NUTRITION DIAGNOSIS: Severe malnutrition continues   MALNUTRITION DIAGNOSIS: severe malnutrition continues   INTERVENTION:  Discussed importance of family reminding patient to eat.   Discussed high calorie, high protein foods Provided 2nd case of ensure enlive today with coupons.    MONITORING, EVALUATION, GOAL: weight trends, intake   NEXT VISIT: July 23 during infusion  Eufelia Veno B. Zenia Resides, [place], [place] Registered Dietitian [phone] (pager)

## 2018-01-16 NOTE — Patient Instructions (Signed)
[facility] Discharge Instructions for Patients Receiving Chemotherapy   Beginning January 23rd 2017 lab work for the Cancer Center will be done in the  Main lab at [facility] on 1st floor. If you have a lab appointment with the Cancer Center please come in thru the  Main Entrance and check in at the main information desk   Today you received the following chemotherapy agents   To help prevent nausea and vomiting after your treatment, we encourage you to take your nausea medication     If you develop nausea and vomiting, or diarrhea that is not controlled by your medication, call the clinic.  The clinic phone number is [phone]. Office hours are Monday-Friday 8:30am-5:00pm.  BELOW ARE SYMPTOMS THAT SHOULD BE REPORTED IMMEDIATELY:  *FEVER GREATER THAN 101.0 F  *CHILLS WITH OR WITHOUT FEVER  NAUSEA AND VOMITING THAT IS NOT CONTROLLED WITH YOUR NAUSEA MEDICATION  *UNUSUAL SHORTNESS OF BREATH  *UNUSUAL BRUISING OR BLEEDING  TENDERNESS IN MOUTH AND THROAT WITH OR WITHOUT PRESENCE OF ULCERS  *URINARY PROBLEMS  *BOWEL PROBLEMS  UNUSUAL RASH Items with * indicate a potential emergency and should be followed up as soon as possible. If you have an emergency after office hours please contact your primary care physician or go to the nearest emergency department.  Please call the clinic during office hours if you have any questions or concerns.   You may also contact the Patient Navigator at [phone] should you have any questions or need assistance in obtaining follow up care.      Resources For Cancer Patients and their Caregivers ? American Cancer Society: Can assist with transportation, wigs, general needs, runs Look Good Feel Better.        1-888-227-6333 ? Cancer Care: Provides financial assistance, online support groups, medication/co-pay assistance.  1-800-813-HOPE (4673) ? [facility] Assists [place] cancer  patients and their families through emotional , educational and financial support.  336-427-4357 ? [place] DSS Where to apply for food stamps, Medicaid and utility assistance. 336-342-1394 ? RCATS: Transportation to medical appointments. 336-347-2287 ? Social Security Administration: May apply for disability if have a Stage IV cancer. 336-342-7796 1-800-772-1213 ? [place] Aging, Disability and Transit Services: Assists with nutrition, care and transit needs. 336-349-2343         

## 2018-01-16 NOTE — Progress Notes (Signed)
Treatment given per orders. Patient tolerated it well without problems. Vitals stable and discharged home from clinic ambulatory. Follow up as scheduled.  

## 2018-01-19 ENCOUNTER — Encounter (HOSPITAL_COMMUNITY): Payer: Self-pay

## 2018-01-19 ENCOUNTER — Other Ambulatory Visit (HOSPITAL_COMMUNITY): Payer: Self-pay

## 2018-01-19 ENCOUNTER — Inpatient Hospital Stay (HOSPITAL_COMMUNITY): Payer: Medicare Other | Attending: Hematology

## 2018-01-19 DIAGNOSIS — D631 Anemia in chronic kidney disease: Secondary | ICD-10-CM

## 2018-01-19 DIAGNOSIS — N183 Chronic kidney disease, stage 3 unspecified: Secondary | ICD-10-CM

## 2018-01-19 DIAGNOSIS — D46Z Other myelodysplastic syndromes: Secondary | ICD-10-CM | POA: Diagnosis present

## 2018-01-19 DIAGNOSIS — R5383 Other fatigue: Secondary | ICD-10-CM | POA: Diagnosis not present

## 2018-01-19 DIAGNOSIS — C3491 Malignant neoplasm of unspecified part of right bronchus or lung: Secondary | ICD-10-CM | POA: Insufficient documentation

## 2018-01-19 DIAGNOSIS — Z5111 Encounter for antineoplastic chemotherapy: Secondary | ICD-10-CM | POA: Diagnosis not present

## 2018-01-19 DIAGNOSIS — Z87891 Personal history of nicotine dependence: Secondary | ICD-10-CM | POA: Diagnosis not present

## 2018-01-19 LAB — CBC WITH DIFFERENTIAL/PLATELET
BASOS ABS: 0 10*3/uL (ref 0.0–0.1)
Basophils Relative: 1 %
EOS ABS: 0.2 10*3/uL (ref 0.0–0.7)
Eosinophils Relative: 11 %
HCT: 24.5 % — ABNORMAL LOW (ref 36.0–46.0)
HEMOGLOBIN: 7.9 g/dL — AB (ref 12.0–15.0)
Lymphocytes Relative: 26 %
Lymphs Abs: 0.5 10*3/uL — ABNORMAL LOW (ref 0.7–4.0)
MCH: 31 pg (ref 26.0–34.0)
MCHC: 32.2 g/dL (ref 30.0–36.0)
MCV: 96.1 fL (ref 78.0–100.0)
Monocytes Absolute: 0.1 10*3/uL (ref 0.1–1.0)
Monocytes Relative: 4 %
Neutro Abs: 1.2 10*3/uL — ABNORMAL LOW (ref 1.7–7.7)
Neutrophils Relative %: 58 %
PLATELETS: 172 10*3/uL (ref 150–400)
RBC: 2.55 MIL/uL — AB (ref 3.87–5.11)
RDW: 16.2 % — ABNORMAL HIGH (ref 11.5–15.5)
WBC: 2 10*3/uL — AB (ref 4.0–10.5)

## 2018-01-19 LAB — SAMPLE TO BLOOD BANK

## 2018-02-09 ENCOUNTER — Inpatient Hospital Stay (HOSPITAL_BASED_OUTPATIENT_CLINIC_OR_DEPARTMENT_OTHER): Payer: Medicare Other | Admitting: Hematology

## 2018-02-09 ENCOUNTER — Encounter (HOSPITAL_COMMUNITY): Payer: Self-pay | Admitting: Hematology

## 2018-02-09 ENCOUNTER — Inpatient Hospital Stay (HOSPITAL_COMMUNITY): Payer: Medicare Other | Attending: Hematology

## 2018-02-09 ENCOUNTER — Other Ambulatory Visit: Payer: Self-pay

## 2018-02-09 ENCOUNTER — Inpatient Hospital Stay (HOSPITAL_COMMUNITY): Payer: Medicare Other

## 2018-02-09 DIAGNOSIS — Z9289 Personal history of other medical treatment: Secondary | ICD-10-CM

## 2018-02-09 DIAGNOSIS — R5383 Other fatigue: Secondary | ICD-10-CM

## 2018-02-09 DIAGNOSIS — Z87891 Personal history of nicotine dependence: Secondary | ICD-10-CM | POA: Diagnosis not present

## 2018-02-09 DIAGNOSIS — D46Z Other myelodysplastic syndromes: Secondary | ICD-10-CM

## 2018-02-09 DIAGNOSIS — D649 Anemia, unspecified: Secondary | ICD-10-CM

## 2018-02-09 DIAGNOSIS — D469 Myelodysplastic syndrome, unspecified: Secondary | ICD-10-CM

## 2018-02-09 DIAGNOSIS — C3491 Malignant neoplasm of unspecified part of right bronchus or lung: Secondary | ICD-10-CM

## 2018-02-09 DIAGNOSIS — Z5111 Encounter for antineoplastic chemotherapy: Secondary | ICD-10-CM | POA: Diagnosis not present

## 2018-02-09 LAB — CBC WITH DIFFERENTIAL/PLATELET
BASOS PCT: 2 %
Basophils Absolute: 0 10*3/uL (ref 0.0–0.1)
EOS ABS: 0 10*3/uL (ref 0.0–0.7)
EOS PCT: 4 %
HEMATOCRIT: 25.1 % — AB (ref 36.0–46.0)
Hemoglobin: 8.1 g/dL — ABNORMAL LOW (ref 12.0–15.0)
Lymphocytes Relative: 50 %
Lymphs Abs: 0.5 10*3/uL (ref 0.7–4.0)
MCH: 32 pg (ref 26.0–34.0)
MCHC: 32.3 g/dL (ref 30.0–36.0)
MCV: 99.2 fL (ref 78.0–100.0)
MONO ABS: 0.1 10*3/uL (ref 0.1–1.0)
Monocytes Relative: 9 %
Neutro Abs: 0.3 10*3/uL (ref 1.7–7.7)
Neutrophils Relative %: 35 %
PLATELETS: 219 10*3/uL (ref 150–400)
RBC: 2.53 MIL/uL — ABNORMAL LOW (ref 3.87–5.11)
RDW: 17 % — AB (ref 11.5–15.5)
WBC: 0.9 10*3/uL — CL (ref 4.0–10.5)

## 2018-02-09 LAB — COMPREHENSIVE METABOLIC PANEL
ALBUMIN: 3.3 g/dL — AB (ref 3.5–5.0)
ALT: 13 U/L (ref 0–44)
ANION GAP: 7 (ref 5–15)
AST: 18 U/L (ref 15–41)
Alkaline Phosphatase: 59 U/L (ref 38–126)
BILIRUBIN TOTAL: 0.7 mg/dL (ref 0.3–1.2)
BUN: 22 mg/dL (ref 8–23)
CO2: 27 mmol/L (ref 22–32)
Calcium: 8.5 mg/dL — ABNORMAL LOW (ref 8.9–10.3)
Chloride: 106 mmol/L (ref 98–111)
Creatinine, Ser: 1.18 mg/dL — ABNORMAL HIGH (ref 0.44–1.00)
GFR calc Af Amer: 49 mL/min — ABNORMAL LOW (ref >60)
GFR calc non Af Amer: 43 mL/min — ABNORMAL LOW (ref >60)
GLUCOSE: 115 mg/dL — AB (ref 70–99)
POTASSIUM: 3.9 mmol/L (ref 3.5–5.1)
SODIUM: 140 mmol/L (ref 135–145)
TOTAL PROTEIN: 6.1 g/dL — AB (ref 6.5–8.1)

## 2018-02-09 LAB — SAMPLE TO BLOOD BANK

## 2018-02-09 NOTE — Progress Notes (Signed)
Patient not to be treated today per oncologist.

## 2018-02-09 NOTE — Patient Instructions (Addendum)
[place] at [facility] Discharge Instructions  Start treatment back Monday with labs before . Follow up with [place] in 5 weeks with treatment and labs that day.   Today you saw Dr. Raliegh Ip.    Thank you for choosing [place] at [facility] to provide your oncology and hematology care.  To afford each patient quality time with our provider, please arrive at least 15 minutes before your scheduled appointment time.   If you have a lab appointment with the [place] please come in thru the  Main Entrance and check in at the main information desk  You need to re-schedule your appointment should you arrive 10 or more minutes late.  We strive to give you quality time with our providers, and arriving late affects you and other patients whose appointments are after yours.  Also, if you no show three or more times for appointments you may be dismissed from the clinic at the providers discretion.     Again, thank you for choosing [facility].  Our hope is that these requests will decrease the amount of time that you wait before being seen by our physicians.       _____________________________________________________________  Should you have questions after your visit to [facility], please contact our office at [phone] between the hours of 8:30 a.m. and 4:30 p.m.  Voicemails left after 4:30 p.m. will not be returned until the following business day.  For prescription refill requests, have your pharmacy contact our office.       Resources For Cancer Patients and their Caregivers ? American Cancer Society: Can assist with transportation, wigs, general needs, runs Look Good Feel Better.        303-633-3283 ? Cancer Care: Provides financial assistance, online support groups, medication/co-pay assistance.  1-800-813-HOPE (715) 273-0245) ? [facility] Assists [place] cancer patients and their families  through emotional , educational and financial support.  757-042-3330 ? [place] DSS Where to apply for food stamps, Medicaid and utility assistance. 219 171 6684 ? RCATS: Transportation to medical appointments. (518)797-1287 ? Social Security Administration: May apply for disability if have a Stage IV cancer. 321 445 5577 520-776-6938 ? [employer], Disability and Transit Services: Assists with nutrition, care and transit needs. [place] Support Programs:   > Cancer Support Group  2nd Tuesday of the month 1pm-2pm, Journey Room   > Creative Journey  3rd Tuesday of the month 1130am-1pm, Journey Room

## 2018-02-09 NOTE — Progress Notes (Signed)
CRITICAL VALUE ALERT  Critical Value:  WBC 0.9  Date & Time Notied:  02/09/18 at 0956  Provider Notified: Dr. Delton Coombes  Orders Received/Actions taken: n/a

## 2018-02-09 NOTE — Assessment & Plan Note (Signed)
1. 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  Megakaryocytes are increased with frequent small, hyperlobulated and hyperchromatic forms.  Granulocytic precursors have no significant dysplasia. 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 -She received a total of 5 points based on revised IPSS system, with a median survival in the absence of therapy of 1.6 years, 25% AML progression in the absence of therapy and 1.4 years.  She has received chemotherapy with carboplatin and etoposide in 2017.  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  She has been receiving Aranesp for more than a year, dose increased to 200 mcg every 2 weeks in December 2019.  We have given Aranesp at last visit a week ago.  She completely lost response to erythropoiesis stimulating agents. - Azacitidine 75 mg/m x 5 days every 28 days cycle 1 started on 11/17/2017 - Received 1 unit of blood transfusion for hemoglobin of 7 on 11/17/2017, 2 units of blood transfusion on 12/03/2017, on cycle 1 day 14. - Cycle 3 of azacitidine on 01/12/2018.  Patient did not require any blood transfusions in the past few weeks.  Today her white count is 0.9.  I would hold off on her next cycle today.  She will come back next Monday to check her blood counts and start her cycle 4.  I will see her back in 5 weeks for follow-up prior to start of cycle 5.  I think she is beginning to get a response as she is becoming transfusion dependent.  2.  Weight loss: She has used Marinol in the past without much help.  Right now her appetite has picked up.  She gained about 2 pounds since last visit.  3.  Small cell lung cancer: She has completed chemoradiation  therapy around July 2017.  Her last CT scan was in December 2018.  We will plan to repeat CT scan soon.

## 2018-02-09 NOTE — Progress Notes (Signed)
[place], [place] [address]   CLINIC:  Medical Oncology/Hematology  PCP:  Chevis Pretty, [place] [address] [phone]   REASON FOR VISIT:  Follow-up for myelodysplastic syndrome (MDS)  CURRENT THERAPY: Azacitidine every 28 days    BRIEF ONCOLOGIC HISTORY:    Small cell carcinoma of right lung ([facility])   11/28/2015 Imaging    Large anterior mediastinal mass in continuity with a R hilar mass, narrowing of SVC, abnormal densities in RUL, 9 mm spiculated nodule, nonspec. hypodensity in liver      12/05/2015 PET scan    Large hypermetabolic paratracheal mass c/w SCLC, perihilar nodular densities in RML, mild metabolic activity RUL nodule, no distant metastatic disease      12/11/2015 Procedure    Video bronch with biopsies and brushings, endobronchial ultrasound with mediastinal LN aspiration. Dr. Roxan Hockey      12/11/2015 Pathology Results    Trachea biopsy negative, FNA RUL malignant cells c/w SCLC      12/13/2015 - 03/15/2016 Chemotherapy    Cisplatin/Etoposide x 5 cycles.  Cycle #6 cancelled due to significant cytopenias      12/25/2015 Imaging    MRI brain- No intracranial parenchymal enhancing lesion. Tiny right frontal calvarial enhancing lesion. Small metastatic lesion not excluded although this may represent an incidentally detected benign process.      12/29/2015 - 02/13/2016 Radiation Therapy    XRT      02/14/2016 Treatment Plan Change    Chemotherapy deferred x 1 week      02/21/2016 Treatment Plan Change    Carboplatin dose reduced by 20% and Etoposide dose reduced by 10%.      04/24/2016 Imaging    CT CAP- Chest Impression:  1. Marked reduction in mediastinal and RIGHT hilar lymphadenopathy with no residual pathologically enlarged nodes. 2. Resolution of perihilar nodularity in the RIGHT lung which was hypermetabolic on FDG PET. 3. Persistent nodule in the RIGHT upper lobe which is  not hypermetabolic. 4. New ground-glass nodule in the RIGHT upper lobe is nonspecific and may relate to therapy.  Abdomen / Pelvis Impression:  1. No evidence of metastatic disease in the abdomen pelvis. 2. Atherosclerotic calcification of the aorta.      04/24/2016 Imaging    Bone scan- Nonspecific increased tracer localization at approximately T11 vertebral body; this is of uncertain etiology, could represent a degenerative process though metastatic disease is not excluded.      04/29/2016 -  Radiation Therapy    PCI -- 25 Gy      07/24/2016 Imaging    CT CAP- 1. There has been interval decrease in size of mediastinal lymph nodes. The index lesion within the right upper lobe is also decreased in size from previous exam. 2. New subpleural nodule is noted within the posteromedial right lower lobe. Attention on follow-up imaging advised. 3. Stable ground-glass attenuating nodule within the right lower lobe and anteromedial left upper lobe 4. Aortic atherosclerosis and coronary artery calcification      07/29/2016 Imaging    MRI t-spine: 1. Stable benign slightly enhancing sclerotic lesion in the right posterolateral aspect of the T11 vertebral body, unchanged for 5 years. This is felt to account for the subtle increased activity at T11 on the SPECT bone scan. 2. Slight degenerative disc disease with small disc protrusions at T1-2 and T2-3 and T12-L1 with no neural impingement. 3. No evidence of metastatic disease.      09/23/2016 Imaging  CT chest- Radiation changes in the central right upper lobe/ perihilar region.  Stable small mediastinal lymph nodes, unchanged.  No findings suspicious for new/progressive metastatic disease.      12/25/2016 Imaging    CT C/A/P: IMPRESSION: 1. Continued further decrease in mediastinal lymph nodes. 2. Index nodule antero medial right upper lobe has decreased. The new 5 mm right lower lobe subpleural nodule seen on the previous study  has resolved in the interval. Ground-glass nodules in the upper lobes bilaterally are stable. 3. Interval development of interstitial and alveolar opacity in the parahilar right lung is presumably radiation related. Attention on follow-up recommended. 4. New tiny right pleural effusion with areas of apparent pleural enhancement in the posterior right costophrenic sulcus. Close attention on follow-up recommended. 5. Marked abdominal aortic atherosclerosis with likely infrarenal significant stenosis.       07/02/2017 Imaging    CT Chest w/ contrast: Resolution of central right lower lobe infiltrate and right pleural effusion since prior study.  Stable ground-glass opacities in both upper lobes.  No new or progressive disease      11/16/2017 -  Chemotherapy    The patient had palonosetron (ALOXI) injection 0.25 mg, 0.25 mg, Intravenous,  Once, 3 of 4 cycles Administration: 0.25 mg (11/17/2017), 0.25 mg (11/19/2017), 0.25 mg (11/21/2017), 0.25 mg (01/12/2018), 0.25 mg (01/14/2018), 0.25 mg (01/16/2018), 0.25 mg (12/16/2017), 0.25 mg (12/18/2017) azaCITIDine (VIDAZA) 130 mg in sodium chloride 0.9 % 50 mL chemo infusion, 75 mg/m2 = 130 mg, Intravenous, Once, 3 of 4 cycles Administration: 130 mg (11/17/2017), 130 mg (11/18/2017), 130 mg (11/19/2017), 130 mg (11/20/2017), 130 mg (11/21/2017), 130 mg (12/16/2017), 130 mg (12/17/2017), 130 mg (12/18/2017), 130 mg (12/19/2017), 130 mg (01/12/2018), 130 mg (01/13/2018), 130 mg (01/14/2018), 130 mg (01/15/2018), 130 mg (01/16/2018)  for chemotherapy treatment.        MDS (myelodysplastic syndrome), high grade (HCC)   10/20/2017 Initial Diagnosis    MDS (myelodysplastic syndrome), high grade ([facility])      11/16/2017 -  Chemotherapy    The patient had palonosetron (ALOXI) injection 0.25 mg, 0.25 mg, Intravenous,  Once, 3 of 4 cycles Administration: 0.25 mg (11/17/2017), 0.25 mg (11/19/2017), 0.25 mg (11/21/2017), 0.25 mg (01/12/2018), 0.25 mg (01/14/2018), 0.25 mg (01/16/2018),  0.25 mg (12/16/2017), 0.25 mg (12/18/2017) azaCITIDine (VIDAZA) 130 mg in sodium chloride 0.9 % 50 mL chemo infusion, 75 mg/m2 = 130 mg, Intravenous, Once, 3 of 4 cycles Administration: 130 mg (11/17/2017), 130 mg (11/18/2017), 130 mg (11/19/2017), 130 mg (11/20/2017), 130 mg (11/21/2017), 130 mg (12/16/2017), 130 mg (12/17/2017), 130 mg (12/18/2017), 130 mg (12/19/2017), 130 mg (01/12/2018), 130 mg (01/13/2018), 130 mg (01/14/2018), 130 mg (01/15/2018), 130 mg (01/16/2018)  for chemotherapy treatment.         INTERVAL HISTORY:  Ms. Strother 82 y.o. female returns for routine follow-up for MDS and consideration for next cycle of chemotherapy. Patient is here today with her daughter. She states her energy level is much better since we held her treatment for a week. She is still getting physical therapy at home and it is helping her fatigue. She is using her walker at her house and it is helping her stability. She denies any nausea, vomiting, or diarrhea. Patient states her appetite is better and she is increasing her weight. She continues to drink ensure daily.     REVIEW OF SYSTEMS:  Review of Systems  Constitutional: Positive for fatigue.  HENT:  Negative.   Eyes: Negative.   Respiratory: Negative.   Cardiovascular: Negative.  Gastrointestinal: Negative.   Endocrine: Negative.   Genitourinary: Negative.    Musculoskeletal: Negative.   Skin: Negative.   Neurological: Negative.   Hematological: Negative.   Psychiatric/Behavioral: Negative.      PAST MEDICAL/SURGICAL HISTORY:  Past Medical History:  Diagnosis Date  . Allergy   . Cancer ([facility])    lung  . Hyperlipidemia   . Iron deficiency anemia 12/15/2015  . Low vitamin B12 level 12/15/2015  . Osteopenia    Past Surgical History:  Procedure Laterality Date  . CATARACT EXTRACTION, BILATERAL Bilateral   . COLONOSCOPY N/A 12/06/2016   Procedure: COLONOSCOPY;  Surgeon: Danie Binder, MD;  Location: [facility];  Service: Endoscopy;  Laterality:  N/A;  1:45pm  . ESOPHAGOGASTRODUODENOSCOPY N/A 12/06/2016   Procedure: ESOPHAGOGASTRODUODENOSCOPY (EGD);  Surgeon: Danie Binder, MD;  Location: [facility];  Service: Endoscopy;  Laterality: N/A;  . MINOR EXCISION EAR CANAL CYST Left    30+ years ago  . SMALL INTESTINE SURGERY    . VIDEO BRONCHOSCOPY WITH ENDOBRONCHIAL ULTRASOUND N/A 12/11/2015   Procedure: VIDEO BRONCHOSCOPY WITH ENDOBRONCHIAL ULTRASOUND;  Surgeon: Melrose Nakayama, MD;  Location: [facility];  Service: Thoracic;  Laterality: N/A;     SOCIAL HISTORY:  Social History   Socioeconomic History  . Marital status: Married    Spouse name: Not on file  . Number of children: Not on file  . Years of education: Not on file  . Highest education level: Not on file  Occupational History  . Not on file  Social Needs  . Financial resource strain: Not on file  . Food insecurity:    Worry: Not on file    Inability: Not on file  . Transportation needs:    Medical: Not on file    Non-medical: Not on file  Tobacco Use  . Smoking status: Former Smoker    Packs/day: 0.25    Years: 50.00    Pack years: 12.50    Last attempt to quit: 01/08/2016    Years since quitting: 2.0  . Smokeless tobacco: Never Used  Substance and Sexual Activity  . Alcohol use: No  . Drug use: No  . Sexual activity: Not Currently  Lifestyle  . Physical activity:    Days per week: Not on file    Minutes per session: Not on file  . Stress: Not on file  Relationships  . Social connections:    Talks on phone: Not on file    Gets together: Not on file    Attends religious service: Not on file    Active member of club or organization: Not on file    Attends meetings of clubs or organizations: Not on file    Relationship status: Not on file  . Intimate partner violence:    Fear of current or ex partner: Not on file    Emotionally abused: Not on file    Physically abused: Not on file    Forced sexual activity: Not on file  Other Topics Concern  .  Not on file  Social History Narrative  . Not on file    FAMILY HISTORY:  Family History  Problem Relation Age of Onset  . Cancer Mother   . Hypertension Father   . Colon cancer Neg Hx     CURRENT MEDICATIONS:  Outpatient Encounter Medications as of 02/09/2018  Medication Sig  . cyanocobalamin (,VITAMIN B-12,) 1000 MCG/ML injection Inject 1 mL (1,000 mcg total) into the muscle every 30 (thirty) days.  Marland Kitchen  docusate sodium (COLACE) 100 MG capsule Take 100 mg by mouth daily.  Marland Kitchen donepezil (ARICEPT) 10 MG tablet Take 1 tablet (10 mg total) by mouth at bedtime.  . dronabinol (MARINOL) 2.5 MG capsule Take 1 capsule (2.5 mg total) by mouth 2 (two) times daily before a meal.  . memantine (NAMENDA) 5 MG tablet Take 1 tablet (5 mg total) by mouth 2 (two) times daily.  . ondansetron (ZOFRAN) 8 MG tablet Take 1 tablet (8 mg total) by mouth 2 (two) times daily. Start on the third day from the last day of chemotherapy  . prochlorperazine (COMPAZINE) 10 MG tablet Take 1 tablet (10 mg total) by mouth every 6 (six) hours as needed for nausea or vomiting.   No facility-administered encounter medications on file as of 02/09/2018.     ALLERGIES:  Allergies  Allergen Reactions  . Flagyl [Metronidazole Hcl] Nausea Only  . Penicillins Other (See Comments)    Has patient had a PCN reaction causing immediate rash, facial/tongue/throat swelling, SOB or lightheadedness with hypotension: unknown Has patient had a PCN reaction causing severe rash involving mucus membranes or skin necrosis: unknown Has patient had a PCN reaction that required hospitalization unknown Has patient had a PCN reaction occurring within the last 10 years: unknown  If all of the above answers are "NO", then may proceed with Cephalosporin use.     PHYSICAL EXAM:  ECOG Performance status: 2  Vitals:   02/09/18 1009  BP: 110/61  Pulse: 74  Resp: 18  Temp: 97.6 F (36.4 C)  SpO2: 100%   Filed Weights   02/09/18 1009  Weight:  135 lb 4.8 oz (61.4 kg)    Physical Exam   LABORATORY DATA:  I have reviewed the labs as listed.  CBC    Component Value Date/Time   WBC 0.9 (LL) 02/09/2018 0933   RBC 2.53 (L) 02/09/2018 0933   HGB 8.1 (L) 02/09/2018 0933   HCT 25.1 (L) 02/09/2018 0933   PLT 219 02/09/2018 0933   MCV 99.2 02/09/2018 0933   MCV 71.9 (A) 08/20/2013 1559   MCH 32.0 02/09/2018 0933   MCHC 32.3 02/09/2018 0933   RDW 17.0 (H) 02/09/2018 0933   LYMPHSABS 0.5 02/09/2018 0933   MONOABS 0.1 02/09/2018 0933   EOSABS 0.0 02/09/2018 0933   BASOSABS 0.0 02/09/2018 0933   CMP Latest Ref Rng & Units 02/09/2018 01/12/2018 12/30/2017  Glucose 70 - 99 mg/dL 115(H) 112(H) 123(H)  BUN 8 - 23 mg/dL 22 30(H) 22(H)  Creatinine 0.44 - 1.00 mg/dL 1.18(H) 1.19(H) 1.12(H)  Sodium 135 - 145 mmol/L 140 141 142  Potassium 3.5 - 5.1 mmol/L 3.9 4.0 3.7  Chloride 98 - 111 mmol/L 106 106 107  CO2 22 - 32 mmol/L 27 28 27   Calcium 8.9 - 10.3 mg/dL 8.5(L) 8.9 9.2  Total Protein 6.5 - 8.1 g/dL 6.1(L) 6.5 6.4(L)  Total Bilirubin 0.3 - 1.2 mg/dL 0.7 0.8 0.9  Alkaline Phos 38 - 126 U/L 59 63 63  AST 15 - 41 U/L 18 21 20   ALT 0 - 44 U/L 13 12(L) 12(L)        ASSESSMENT & PLAN:   MDS (myelodysplastic syndrome), high grade (HCC) 1. 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  Megakaryocytes are increased with frequent small, hyperlobulated and hyperchromatic forms.  Granulocytic precursors  have no significant dysplasia. 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 -She received a total of 5 points based on revised IPSS system, with a median survival in the absence of therapy of 1.6 years, 25% AML progression in the  absence of therapy and 1.4 years.  She has received chemotherapy with carboplatin and etoposide in 2017.  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  She has been receiving Aranesp for more than a year, dose increased to 200 mcg every 2 weeks in December 2019.  We have given Aranesp at last visit a week ago.  She completely lost response to erythropoiesis stimulating agents. - Azacitidine 75 mg/m x 5 days every 28 days cycle 1 started on 11/17/2017 - Received 1 unit of blood transfusion for hemoglobin of 7 on 11/17/2017, 2 units of blood transfusion on 12/03/2017, on cycle 1 day 14. - Cycle 3 of azacitidine on 01/12/2018.  Patient did not require any blood transfusions in the past few weeks.  Today her white count is 0.9.  I would hold off on her next cycle today.  She will come back next Monday to check her blood counts and start her cycle 4.  I will see her back in 5 weeks for follow-up prior to start of cycle 5.  I think she is beginning to get a response as she is becoming transfusion dependent.  2.  Weight loss: She has used Marinol in the past without much help.  Right now her appetite has picked up.  She gained about 2 pounds since last visit.  3.  Small cell lung cancer: She has completed chemoradiation therapy around July 2017.  Her last CT scan was in December 2018.  We will plan to repeat CT scan soon.      Orders placed this encounter:  Orders Placed This Encounter  Procedures  . CBC with Differential/Platelet  . Comprehensive metabolic panel  . CBC with Differential/Platelet  . Comprehensive metabolic panel      Derek Jack, MD [facility] [phone]

## 2018-02-10 ENCOUNTER — Ambulatory Visit (HOSPITAL_COMMUNITY): Payer: Medicare Other

## 2018-02-10 ENCOUNTER — Encounter (HOSPITAL_COMMUNITY): Payer: Medicare Other | Admitting: Dietician

## 2018-02-11 ENCOUNTER — Ambulatory Visit (HOSPITAL_COMMUNITY): Payer: Medicare Other

## 2018-02-12 ENCOUNTER — Ambulatory Visit (HOSPITAL_COMMUNITY): Payer: Medicare Other

## 2018-02-13 ENCOUNTER — Ambulatory Visit (HOSPITAL_COMMUNITY): Payer: Medicare Other

## 2018-02-16 ENCOUNTER — Inpatient Hospital Stay (HOSPITAL_COMMUNITY): Payer: Medicare Other

## 2018-02-16 ENCOUNTER — Other Ambulatory Visit: Payer: Self-pay

## 2018-02-16 ENCOUNTER — Encounter (HOSPITAL_COMMUNITY): Payer: Self-pay

## 2018-02-16 VITALS — BP 120/54 | HR 62 | Temp 97.7°F | Resp 16 | Wt 134.6 lb

## 2018-02-16 DIAGNOSIS — D46Z Other myelodysplastic syndromes: Secondary | ICD-10-CM

## 2018-02-16 DIAGNOSIS — C3491 Malignant neoplasm of unspecified part of right bronchus or lung: Secondary | ICD-10-CM

## 2018-02-16 DIAGNOSIS — Z5111 Encounter for antineoplastic chemotherapy: Secondary | ICD-10-CM | POA: Diagnosis not present

## 2018-02-16 LAB — COMPREHENSIVE METABOLIC PANEL
ALK PHOS: 65 U/L (ref 38–126)
ALT: 11 U/L (ref 0–44)
ANION GAP: 6 (ref 5–15)
AST: 19 U/L (ref 15–41)
Albumin: 3.5 g/dL (ref 3.5–5.0)
BUN: 18 mg/dL (ref 8–23)
CO2: 26 mmol/L (ref 22–32)
Calcium: 8.7 mg/dL — ABNORMAL LOW (ref 8.9–10.3)
Chloride: 109 mmol/L (ref 98–111)
Creatinine, Ser: 1.25 mg/dL — ABNORMAL HIGH (ref 0.44–1.00)
GFR calc non Af Amer: 40 mL/min — ABNORMAL LOW (ref >60)
GFR, EST AFRICAN AMERICAN: 46 mL/min — AB (ref >60)
Glucose, Bld: 103 mg/dL — ABNORMAL HIGH (ref 70–99)
Potassium: 3.9 mmol/L (ref 3.5–5.1)
SODIUM: 141 mmol/L (ref 135–145)
TOTAL PROTEIN: 6.2 g/dL — AB (ref 6.5–8.1)
Total Bilirubin: 0.7 mg/dL (ref 0.3–1.2)

## 2018-02-16 LAB — CBC WITH DIFFERENTIAL/PLATELET
BASOS ABS: 0 10*3/uL (ref 0.0–0.1)
Basophils Relative: 1 %
Eosinophils Absolute: 0 10*3/uL (ref 0.0–0.7)
Eosinophils Relative: 1 %
HCT: 26.1 % — ABNORMAL LOW (ref 36.0–46.0)
Hemoglobin: 8.4 g/dL — ABNORMAL LOW (ref 12.0–15.0)
LYMPHS ABS: 0.5 10*3/uL — AB (ref 0.7–4.0)
Lymphocytes Relative: 34 %
MCH: 31.9 pg (ref 26.0–34.0)
MCHC: 32.2 g/dL (ref 30.0–36.0)
MCV: 99.2 fL (ref 78.0–100.0)
MONOS PCT: 17 %
Monocytes Absolute: 0.3 10*3/uL (ref 0.1–1.0)
Neutro Abs: 0.8 10*3/uL — ABNORMAL LOW (ref 1.7–7.7)
Neutrophils Relative %: 47 %
PLATELETS: 203 10*3/uL (ref 150–400)
RBC: 2.63 MIL/uL — AB (ref 3.87–5.11)
RDW: 16.2 % — ABNORMAL HIGH (ref 11.5–15.5)
WBC: 1.6 10*3/uL — AB (ref 4.0–10.5)

## 2018-02-16 MED ORDER — DEXAMETHASONE SODIUM PHOSPHATE 100 MG/10ML IJ SOLN: 10.0000 mg | Freq: Once | INTRAMUSCULAR | Status: DC

## 2018-02-16 MED ORDER — SODIUM CHLORIDE 0.9 % IV SOLN
67.5000 mg/m2 | Freq: Once | INTRAVENOUS | Status: AC
  Administered 2018-02-16: 115 mg via INTRAVENOUS
  Filled 2018-02-16: qty 11.5

## 2018-02-16 MED ORDER — SODIUM CHLORIDE 0.9 % IV SOLN: 510.0000 mg | Freq: Once | INTRAVENOUS | Status: DC

## 2018-02-16 MED ORDER — SODIUM CHLORIDE 0.9 % IV SOLN
INTRAVENOUS | Status: DC
  Administered 2018-02-16: 12:00:00 via INTRAVENOUS

## 2018-02-16 MED ORDER — PALONOSETRON HCL INJECTION 0.25 MG/5ML
0.2500 mg | Freq: Once | INTRAVENOUS | Status: AC
  Administered 2018-02-16: 0.25 mg via INTRAVENOUS
  Filled 2018-02-16: qty 5

## 2018-02-16 MED ORDER — DEXAMETHASONE SODIUM PHOSPHATE 10 MG/ML IJ SOLN
10.0000 mg | Freq: Once | INTRAMUSCULAR | Status: AC
  Administered 2018-02-16: 10 mg via INTRAVENOUS
  Filled 2018-02-16: qty 1

## 2018-02-16 NOTE — Progress Notes (Signed)
Labs reviewed with Dr. Delton Coombes - okay to tx today per MD.   Tolerated infusion w/o adverse reaction.  Alert, in no distress.  Discharged via wheelchair in c/o family.

## 2018-02-17 ENCOUNTER — Inpatient Hospital Stay (HOSPITAL_COMMUNITY): Payer: Medicare Other | Attending: Internal Medicine | Admitting: Dietician

## 2018-02-17 ENCOUNTER — Inpatient Hospital Stay (HOSPITAL_COMMUNITY): Payer: Medicare Other

## 2018-02-17 VITALS — BP 108/44 | HR 69 | Temp 97.6°F | Resp 18

## 2018-02-17 DIAGNOSIS — D46Z Other myelodysplastic syndromes: Secondary | ICD-10-CM

## 2018-02-17 DIAGNOSIS — Z5111 Encounter for antineoplastic chemotherapy: Secondary | ICD-10-CM | POA: Diagnosis not present

## 2018-02-17 DIAGNOSIS — C3491 Malignant neoplasm of unspecified part of right bronchus or lung: Secondary | ICD-10-CM

## 2018-02-17 MED ORDER — SODIUM CHLORIDE 0.9 % IV SOLN
Freq: Once | INTRAVENOUS | Status: AC
  Administered 2018-02-17: 10:00:00 via INTRAVENOUS

## 2018-02-17 MED ORDER — SODIUM CHLORIDE 0.9 % IV SOLN
10.0000 mg | Freq: Once | INTRAVENOUS | Status: AC
  Administered 2018-02-17: 10 mg via INTRAVENOUS
  Filled 2018-02-17: qty 1

## 2018-02-17 MED ORDER — AZACITIDINE CHEMO INJECTION 100 MG
67.5000 mg/m2 | Freq: Once | INTRAMUSCULAR | Status: AC
  Administered 2018-02-17: 115 mg via INTRAVENOUS
  Filled 2018-02-17: qty 11.5

## 2018-02-17 NOTE — Progress Notes (Signed)
Tolerated infusion w/o adverse reaction.  Alert, in no distress.  Discharged ambulatory (using walker) in c/o family.

## 2018-02-17 NOTE — Progress Notes (Signed)
Nutrition Follow-up 82 y/o female PMHx HLD, and SCLC -dx in 2017, successfully treated w/ chemoradiation. Remains in remission. Developed pancytopenia this past Feb and ultimately a Bone marrow aspirate biopsy showed myelodysplastic syndrome, thought to be result of past SCLC chemotherapy regimen. Started chemo for therapy-related MDS 4/29  Patient seen in infusion with one of her daughters. Given patients cognitive deficits, most history received from daughter.   She reports that they (her and other daughter) have "been on her more" and "more firm" about reminding the patient to eat. Daughter notes they feel they have found an eating routine that works. They have been directing the patient to eat every couple hours. She is drinking 2 ensures each day.   Patient reports that last nights dinner was [employer], cabbage, cornbread and buttermilk. She drinks spite during the day.   Weight wise, patients weight has stabilized. Has been 132-135 x 2 months now.   Patient denies any chemotherapy related side effects. No n/v/c/d.   Wt Readings from Last 10 Encounters:  02/16/18 134 lb 9.6 oz (61.1 kg)  02/09/18 135 lb 4.8 oz (61.4 kg)  01/14/18 132 lb (59.9 kg)  01/13/18 133 lb 9.6 oz (60.6 kg)  01/12/18 133 lb 4.8 oz (60.5 kg)  12/30/17 134 lb (60.8 kg)  12/30/17 134 lb (60.8 kg)  12/16/17 132 lb (59.9 kg)  11/28/17 139 lb 9.6 oz (63.3 kg)  11/27/17 137 lb (62.1 kg)   MEDICATIONS: Marinol, Namenda, Aricept, zofran, B12, compazine, colace Chemo: Azacitidine  LABS: Creat:1.25 (about her baseline), H/H:8.4/26.1, WBC:1.6, Albumin:3.5  Recent Labs  Lab 02/16/18 0959  NA 141  K 3.9  CL 109  CO2 26  BUN 18  CREATININE 1.25*  CALCIUM 8.7*  GLUCOSE 103*    ANTHROPOMETRICS: Height:  Ht Readings from Last 1 Encounters:  12/30/17 5\' 4"  (1.626 m)   Weight:  Wt Readings from Last 1 Encounters:  02/16/18 134 lb 9.6 oz (61.1 kg)   BMI:  BMI Readings from Last 1 Encounters:  02/16/18  23.10 kg/m   IBW: 120 lbs or 54.54 kg      ESTIMATED ENERGY NEEDS:  Kcal: 1700-1900 (28-31 kcal/kg bw) Protein: 80-90g Pro (1.3-1.5g/kg bw) Fluid: 1.7-1.9 L fluid (78ml/kcal)  NUTRITION DIAGNOSIS:  Increased protein/kcal needs r/t cancer and cancer related treatments as evidenced by the nutritional recommendations for this disease state and tx   DOCUMENTATION CODES:  Malnutrition no longer applicable given report of increased intakes and weight stability x2 months  INTERVENTION:  Thanks to excellent family oversight, the patient seems to have stabilized. The daughters are having the patient follow oncology nutritional recommendations-she is eating every few hours and is prioritizing high kcal/pro items. If the patient says she does not want to eat anything, the daughters have the patient drink a supplement.    Patient herself denies any adverse side effects from chemotherapy and says she feels well.   She says she mostly drinks diet sprite. Patient has a history of inadequate oral intake. RD inquired about fluid intake.  Denies any lightheadedness, headaches, dark urine.    Patient notes she is about out of Ensure. RD provided patient with her third case of ensure enlive today.   Given the patients excellent adherence to an appropriate diet and weight stability, no follow up scheduled at this time.   GOAL:  Patient will meet greater than or equal to 90% of their needs  MONITOR:  PO intake, Weight trends, Labs  Next Visit: As needed  Ovid Curd  Cecille Rubin RD, LDN, CNSC Clinical Nutrition Available Tues-Sat via Pager: [phone] 02/17/2018 12:13 PM

## 2018-02-18 ENCOUNTER — Inpatient Hospital Stay (HOSPITAL_COMMUNITY): Payer: Medicare Other

## 2018-02-18 ENCOUNTER — Other Ambulatory Visit (HOSPITAL_COMMUNITY): Payer: Self-pay | Admitting: Oncology

## 2018-02-18 VITALS — BP 99/48 | HR 74 | Temp 98.1°F | Resp 18

## 2018-02-18 DIAGNOSIS — Z5111 Encounter for antineoplastic chemotherapy: Secondary | ICD-10-CM | POA: Diagnosis not present

## 2018-02-18 DIAGNOSIS — C3491 Malignant neoplasm of unspecified part of right bronchus or lung: Secondary | ICD-10-CM

## 2018-02-18 DIAGNOSIS — D46Z Other myelodysplastic syndromes: Secondary | ICD-10-CM

## 2018-02-18 MED ORDER — SODIUM CHLORIDE 0.9 % IV SOLN
Freq: Once | INTRAVENOUS | Status: AC
  Administered 2018-02-18: 14:00:00 via INTRAVENOUS

## 2018-02-18 MED ORDER — SODIUM CHLORIDE 0.9 % IV SOLN
10.0000 mg | Freq: Once | INTRAVENOUS | Status: AC
  Administered 2018-02-18: 10 mg via INTRAVENOUS
  Filled 2018-02-18: qty 1

## 2018-02-18 MED ORDER — PALONOSETRON HCL INJECTION 0.25 MG/5ML
0.2500 mg | Freq: Once | INTRAVENOUS | Status: AC
  Administered 2018-02-18: 0.25 mg via INTRAVENOUS
  Filled 2018-02-18: qty 5

## 2018-02-18 MED ORDER — SODIUM CHLORIDE 0.9 % IV SOLN
67.5000 mg/m2 | Freq: Once | INTRAVENOUS | Status: AC
  Administered 2018-02-18: 115 mg via INTRAVENOUS
  Filled 2018-02-18: qty 11.5

## 2018-02-18 NOTE — Progress Notes (Signed)
Tolerated infusion w/o adverse reaction.  Alert, in no distress.  VSS.  Discharged ambulatory in c/o daughter.  

## 2018-02-19 ENCOUNTER — Inpatient Hospital Stay (HOSPITAL_COMMUNITY): Payer: Medicare Other | Attending: Internal Medicine

## 2018-02-19 VITALS — BP 107/45 | HR 74 | Temp 97.6°F | Resp 16

## 2018-02-19 DIAGNOSIS — Z923 Personal history of irradiation: Secondary | ICD-10-CM | POA: Insufficient documentation

## 2018-02-19 DIAGNOSIS — D46Z Other myelodysplastic syndromes: Secondary | ICD-10-CM | POA: Diagnosis present

## 2018-02-19 DIAGNOSIS — C3491 Malignant neoplasm of unspecified part of right bronchus or lung: Secondary | ICD-10-CM | POA: Insufficient documentation

## 2018-02-19 DIAGNOSIS — Z5111 Encounter for antineoplastic chemotherapy: Secondary | ICD-10-CM | POA: Diagnosis not present

## 2018-02-19 DIAGNOSIS — Z87891 Personal history of nicotine dependence: Secondary | ICD-10-CM | POA: Diagnosis not present

## 2018-02-19 MED ORDER — SODIUM CHLORIDE 0.9 % IV SOLN
67.5000 mg/m2 | Freq: Once | INTRAVENOUS | Status: AC
  Administered 2018-02-19: 115 mg via INTRAVENOUS
  Filled 2018-02-19: qty 11.5

## 2018-02-19 MED ORDER — SODIUM CHLORIDE 0.9% FLUSH
10.0000 mL | INTRAVENOUS | Status: DC | PRN
  Administered 2018-02-19: 10 mL
  Filled 2018-02-19: qty 10

## 2018-02-19 MED ORDER — SODIUM CHLORIDE 0.9 % IV SOLN
Freq: Once | INTRAVENOUS | Status: AC
  Administered 2018-02-19: 12:00:00 via INTRAVENOUS

## 2018-02-19 MED ORDER — DEXAMETHASONE SODIUM PHOSPHATE 100 MG/10ML IJ SOLN
10.0000 mg | Freq: Once | INTRAMUSCULAR | Status: AC
  Administered 2018-02-19: 10 mg via INTRAVENOUS
  Filled 2018-02-19: qty 1

## 2018-02-19 NOTE — Progress Notes (Signed)
Patient presented today for Day 4 of vidiza. No new issues at this time. Will proceed with treatment today per orders.   Treatment given per orders. Patient tolerated it well without problems. Vitals stable and discharged home from clinic ambulatory. Follow up as scheduled.

## 2018-02-19 NOTE — Patient Instructions (Signed)
[facility] Discharge Instructions for Patients Receiving Chemotherapy   Beginning January 23rd 2017 lab work for the Cancer Center will be done in the  Main lab at [facility] on 1st floor. If you have a lab appointment with the Cancer Center please come in thru the  Main Entrance and check in at the main information desk   Today you received the following chemotherapy agents   To help prevent nausea and vomiting after your treatment, we encourage you to take your nausea medication     If you develop nausea and vomiting, or diarrhea that is not controlled by your medication, call the clinic.  The clinic phone number is [phone]. Office hours are Monday-Friday 8:30am-5:00pm.  BELOW ARE SYMPTOMS THAT SHOULD BE REPORTED IMMEDIATELY:  *FEVER GREATER THAN 101.0 F  *CHILLS WITH OR WITHOUT FEVER  NAUSEA AND VOMITING THAT IS NOT CONTROLLED WITH YOUR NAUSEA MEDICATION  *UNUSUAL SHORTNESS OF BREATH  *UNUSUAL BRUISING OR BLEEDING  TENDERNESS IN MOUTH AND THROAT WITH OR WITHOUT PRESENCE OF ULCERS  *URINARY PROBLEMS  *BOWEL PROBLEMS  UNUSUAL RASH Items with * indicate a potential emergency and should be followed up as soon as possible. If you have an emergency after office hours please contact your primary care physician or go to the nearest emergency department.  Please call the clinic during office hours if you have any questions or concerns.   You may also contact the Patient Navigator at [phone] should you have any questions or need assistance in obtaining follow up care.      Resources For Cancer Patients and their Caregivers ? American Cancer Society: Can assist with transportation, wigs, general needs, runs Look Good Feel Better.        1-888-227-6333 ? Cancer Care: Provides financial assistance, online support groups, medication/co-pay assistance.  1-800-813-HOPE (4673) ? [facility] Assists [place] cancer  patients and their families through emotional , educational and financial support.  336-427-4357 ? [place] DSS Where to apply for food stamps, Medicaid and utility assistance. 336-342-1394 ? [facility]: Transportation to medical appointments. 336-347-2287 ? Social Security Administration: May apply for disability if have a Stage IV cancer. 336-342-7796 1-800-772-1213 ? [place] [facility]ging, Disability and Transit Services: Assists with nutrition, care and transit needs. 336-349-2343         

## 2018-02-20 ENCOUNTER — Inpatient Hospital Stay (HOSPITAL_COMMUNITY): Payer: Medicare Other

## 2018-02-20 VITALS — BP 112/53 | HR 77 | Temp 97.8°F | Resp 16

## 2018-02-20 DIAGNOSIS — D46Z Other myelodysplastic syndromes: Secondary | ICD-10-CM

## 2018-02-20 DIAGNOSIS — C3491 Malignant neoplasm of unspecified part of right bronchus or lung: Secondary | ICD-10-CM

## 2018-02-20 DIAGNOSIS — Z5111 Encounter for antineoplastic chemotherapy: Secondary | ICD-10-CM | POA: Diagnosis not present

## 2018-02-20 MED ORDER — PALONOSETRON HCL INJECTION 0.25 MG/5ML
0.2500 mg | Freq: Once | INTRAVENOUS | Status: AC
  Administered 2018-02-20: 0.25 mg via INTRAVENOUS

## 2018-02-20 MED ORDER — AZACITIDINE CHEMO INJECTION 100 MG
67.5000 mg/m2 | Freq: Once | INTRAMUSCULAR | Status: AC
  Administered 2018-02-20: 115 mg via INTRAVENOUS
  Filled 2018-02-20: qty 11.5

## 2018-02-20 MED ORDER — SODIUM CHLORIDE 0.9 % IV SOLN
Freq: Once | INTRAVENOUS | Status: AC
  Administered 2018-02-20: 11:00:00 via INTRAVENOUS

## 2018-02-20 MED ORDER — SODIUM CHLORIDE 0.9% FLUSH
10.0000 mL | INTRAVENOUS | Status: DC | PRN
  Administered 2018-02-20: 10 mL
  Filled 2018-02-20: qty 10

## 2018-02-20 MED ORDER — SODIUM CHLORIDE 0.9 % IV SOLN
10.0000 mg | Freq: Once | INTRAVENOUS | Status: AC
  Administered 2018-02-20: 10 mg via INTRAVENOUS
  Filled 2018-02-20: qty 1

## 2018-02-20 MED ORDER — PALONOSETRON HCL INJECTION 0.25 MG/5ML
INTRAVENOUS | Status: AC
  Filled 2018-02-20: qty 5

## 2018-02-20 MED ORDER — HEPARIN SOD (PORK) LOCK FLUSH 100 UNIT/ML IV SOLN
500.0000 [IU] | Freq: Once | INTRAVENOUS | Status: AC | PRN
  Administered 2018-02-20: 500 [IU]
  Filled 2018-02-20: qty 5

## 2018-02-20 NOTE — Progress Notes (Signed)
Treatment given per orders. Patient tolerated it well without problems. Vitals stable and discharged home from clinic ambulatory. Follow up as scheduled.  

## 2018-02-20 NOTE — Patient Instructions (Signed)
[facility] Discharge Instructions for Patients Receiving Chemotherapy   Beginning January 23rd 2017 lab work for the Cancer Center will be done in the  Main lab at [facility] on 1st floor. If you have a lab appointment with the Cancer Center please come in thru the  Main Entrance and check in at the main information desk   Today you received the following chemotherapy agents   To help prevent nausea and vomiting after your treatment, we encourage you to take your nausea medication     If you develop nausea and vomiting, or diarrhea that is not controlled by your medication, call the clinic.  The clinic phone number is [phone]. Office hours are Monday-Friday 8:30am-5:00pm.  BELOW ARE SYMPTOMS THAT SHOULD BE REPORTED IMMEDIATELY:  *FEVER GREATER THAN 101.0 F  *CHILLS WITH OR WITHOUT FEVER  NAUSEA AND VOMITING THAT IS NOT CONTROLLED WITH YOUR NAUSEA MEDICATION  *UNUSUAL SHORTNESS OF BREATH  *UNUSUAL BRUISING OR BLEEDING  TENDERNESS IN MOUTH AND THROAT WITH OR WITHOUT PRESENCE OF ULCERS  *URINARY PROBLEMS  *BOWEL PROBLEMS  UNUSUAL RASH Items with * indicate a potential emergency and should be followed up as soon as possible. If you have an emergency after office hours please contact your primary care physician or go to the nearest emergency department.  Please call the clinic during office hours if you have any questions or concerns.   You may also contact the Patient Navigator at [phone] should you have any questions or need assistance in obtaining follow up care.      Resources For Cancer Patients and their Caregivers ? American Cancer Society: Can assist with transportation, wigs, general needs, runs Look Good Feel Better.        1-888-227-6333 ? Cancer Care: Provides financial assistance, online support groups, medication/co-pay assistance.  1-800-813-HOPE (4673) ? [facility] Assists [place] cancer  patients and their families through emotional , educational and financial support.  336-427-4357 ? [place] DSS Where to apply for food stamps, Medicaid and utility assistance. 336-342-1394 ? [facility]: Transportation to medical appointments. 336-347-2287 ? Social Security Administration: May apply for disability if have a Stage IV cancer. 336-342-7796 1-800-772-1213 ? [place] Aging, Disability and Transit Services: Assists with nutrition, care and transit needs. 336-349-2343         

## 2018-02-25 ENCOUNTER — Other Ambulatory Visit: Payer: Self-pay | Admitting: Nurse Practitioner

## 2018-02-25 DIAGNOSIS — R413 Other amnesia: Secondary | ICD-10-CM

## 2018-02-25 MED ORDER — DONEPEZIL HCL 10 MG PO TABS
10.0000 mg | ORAL_TABLET | Freq: Every day | ORAL | 0 refills | Status: DC
Start: 2018-02-25 — End: 2018-03-31

## 2018-02-25 MED ORDER — DONEPEZIL HCL 10 MG PO TABS: 10.0000 mg | ORAL_TABLET | Freq: Every day | ORAL | 0 refills | Status: DC

## 2018-02-25 MED ORDER — MEMANTINE HCL 5 MG PO TABS: 5.0000 mg | ORAL_TABLET | Freq: Two times a day (BID) | ORAL | 0 refills | Status: DC

## 2018-02-25 NOTE — Telephone Encounter (Signed)
Pt aware refill sent to pharmacy appt made for 03/31/18

## 2018-03-16 ENCOUNTER — Inpatient Hospital Stay (HOSPITAL_COMMUNITY): Payer: Medicare Other

## 2018-03-16 ENCOUNTER — Encounter (HOSPITAL_COMMUNITY): Payer: Self-pay | Admitting: Hematology

## 2018-03-16 ENCOUNTER — Inpatient Hospital Stay (HOSPITAL_BASED_OUTPATIENT_CLINIC_OR_DEPARTMENT_OTHER): Payer: Medicare Other | Admitting: Hematology

## 2018-03-16 VITALS — BP 103/60 | HR 75 | Temp 98.1°F | Resp 16

## 2018-03-16 VITALS — BP 120/52 | HR 86 | Temp 97.0°F | Resp 16 | Wt 137.1 lb

## 2018-03-16 DIAGNOSIS — C3491 Malignant neoplasm of unspecified part of right bronchus or lung: Secondary | ICD-10-CM

## 2018-03-16 DIAGNOSIS — Z87891 Personal history of nicotine dependence: Secondary | ICD-10-CM | POA: Diagnosis not present

## 2018-03-16 DIAGNOSIS — D649 Anemia, unspecified: Secondary | ICD-10-CM

## 2018-03-16 DIAGNOSIS — D46Z Other myelodysplastic syndromes: Secondary | ICD-10-CM

## 2018-03-16 DIAGNOSIS — Z5111 Encounter for antineoplastic chemotherapy: Secondary | ICD-10-CM | POA: Diagnosis not present

## 2018-03-16 LAB — COMPREHENSIVE METABOLIC PANEL
ALBUMIN: 3.4 g/dL — AB (ref 3.5–5.0)
ALT: 17 U/L (ref 0–44)
AST: 22 U/L (ref 15–41)
Alkaline Phosphatase: 70 U/L (ref 38–126)
Anion gap: 8 (ref 5–15)
BUN: 27 mg/dL — AB (ref 8–23)
CHLORIDE: 106 mmol/L (ref 98–111)
CO2: 28 mmol/L (ref 22–32)
CREATININE: 1.21 mg/dL — AB (ref 0.44–1.00)
Calcium: 8.8 mg/dL — ABNORMAL LOW (ref 8.9–10.3)
GFR calc Af Amer: 48 mL/min — ABNORMAL LOW (ref >60)
GFR calc non Af Amer: 41 mL/min — ABNORMAL LOW (ref >60)
GLUCOSE: 126 mg/dL — AB (ref 70–99)
POTASSIUM: 4 mmol/L (ref 3.5–5.1)
Sodium: 142 mmol/L (ref 135–145)
Total Bilirubin: 0.7 mg/dL (ref 0.3–1.2)
Total Protein: 6.5 g/dL (ref 6.5–8.1)

## 2018-03-16 LAB — CBC WITH DIFFERENTIAL/PLATELET
BASOS ABS: 0 10*3/uL (ref 0.0–0.1)
BASOS PCT: 1 %
EOS PCT: 1 %
Eosinophils Absolute: 0 10*3/uL (ref 0.0–0.7)
HEMATOCRIT: 22.8 % — AB (ref 36.0–46.0)
Hemoglobin: 7.5 g/dL — ABNORMAL LOW (ref 12.0–15.0)
LYMPHS PCT: 19 %
Lymphs Abs: 0.4 10*3/uL — ABNORMAL LOW (ref 0.7–4.0)
MCH: 32.8 pg (ref 26.0–34.0)
MCHC: 32.9 g/dL (ref 30.0–36.0)
MCV: 99.6 fL (ref 78.0–100.0)
MONO ABS: 0.1 10*3/uL (ref 0.1–1.0)
Monocytes Relative: 6 %
NEUTROS ABS: 1.6 10*3/uL — AB (ref 1.7–7.7)
Neutrophils Relative %: 73 %
PLATELETS: 68 10*3/uL — AB (ref 150–400)
RBC: 2.29 MIL/uL — AB (ref 3.87–5.11)
RDW: 14.9 % (ref 11.5–15.5)
WBC: 2.2 10*3/uL — AB (ref 4.0–10.5)

## 2018-03-16 LAB — PREPARE RBC (CROSSMATCH)

## 2018-03-16 MED ORDER — ACETAMINOPHEN 325 MG PO TABS
ORAL_TABLET | ORAL | Status: AC
  Filled 2018-03-16: qty 2

## 2018-03-16 MED ORDER — DIPHENHYDRAMINE HCL 25 MG PO CAPS
25.0000 mg | ORAL_CAPSULE | Freq: Once | ORAL | Status: AC
  Administered 2018-03-16: 25 mg via ORAL

## 2018-03-16 MED ORDER — SODIUM CHLORIDE 0.9% FLUSH
10.0000 mL | INTRAVENOUS | Status: AC | PRN
  Administered 2018-03-16: 10 mL

## 2018-03-16 MED ORDER — HEPARIN SOD (PORK) LOCK FLUSH 100 UNIT/ML IV SOLN
500.0000 [IU] | Freq: Every day | INTRAVENOUS | Status: AC | PRN
  Administered 2018-03-16: 500 [IU]

## 2018-03-16 MED ORDER — DIPHENHYDRAMINE HCL 25 MG PO CAPS
ORAL_CAPSULE | ORAL | Status: AC
  Filled 2018-03-16: qty 1

## 2018-03-16 MED ORDER — ACETAMINOPHEN 325 MG PO TABS
650.0000 mg | ORAL_TABLET | Freq: Once | ORAL | Status: AC
  Administered 2018-03-16: 650 mg via ORAL

## 2018-03-16 MED ORDER — SODIUM CHLORIDE 0.9% IV SOLUTION
250.0000 mL | Freq: Once | INTRAVENOUS | Status: AC
  Administered 2018-03-16: 250 mL via INTRAVENOUS

## 2018-03-16 NOTE — Assessment & Plan Note (Signed)
1. 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  Megakaryocytes are increased with frequent small, hyperlobulated and hyperchromatic forms.  Granulocytic precursors have no significant dysplasia. -Chromosome analysis shows 75, XX, del(3)(q21q29),add(7)(p15),del(13)(q12q14)[6]/46,XX[14] -FISH results show 5 q-, loss of chromosome 7, and trisomy 8. -She received a total of 5 points based on revised IPSS system, with a median survival in the absence of therapy of 1.6 years, 25% AML progression in the absence of therapy and 1.4 years.  She has received chemotherapy with carboplatin and etoposide in 2017.  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  She has been receiving Aranesp for more than a year, dose increased to 200 mcg every 2 weeks in December 2019.  We have given Aranesp at last visit a week ago.  She completely lost response to erythropoiesis stimulating agents. - Azacitidine 75 mg/m x 5 days every 28 days cycle 1 started on 11/17/2017 - Received 1 unit of blood transfusion for hemoglobin of 7 on 11/17/2017, 2 units of blood transfusion on 12/03/2017, on cycle 1 day 14. - Cycle 3 of azacitidine on 01/12/2018. - Cycle 4 of azacitidine was on 02/16/2018.  She did feel dizzy and had some constipation.  Hemoglobin is 7.5.  We will hold off on her next cycle today.  She will receive 1 unit of blood transfusion.  Her platelet count is low at 68.  ANC was within normal limits. -I plan to start her cycle 5 next Tuesday.  As our clinic is closed on Monday, she will only received 4 days of treatment.   2.  Weight loss: She has used Marinol in the past without much help.  Right now her appetite has picked up.  Her weight has been stable.  3.  Small cell lung cancer: She has completed chemoradiation therapy  around July 2017.  Her last CT scan was in December 2018.  We will plan to repeat CT scan soon.

## 2018-03-16 NOTE — Progress Notes (Signed)
[address], [place] [address]   CLINIC:  Medical Oncology/Hematology  PCP:  Chevis Pretty, [place] [address] [phone]   REASON FOR VISIT:  Follow-up for Myelodysplastic syndrome MDS  CURRENT THERAPY: Azacitidine every 28 days  BRIEF ONCOLOGIC HISTORY:    Small cell carcinoma of right lung ([place])   11/28/2015 Imaging    Large anterior mediastinal mass in continuity with a R hilar mass, narrowing of SVC, abnormal densities in RUL, 9 mm spiculated nodule, nonspec. hypodensity in liver    12/05/2015 PET scan    Large hypermetabolic paratracheal mass c/w SCLC, perihilar nodular densities in RML, mild metabolic activity RUL nodule, no distant metastatic disease    12/11/2015 Procedure    Video bronch with biopsies and brushings, endobronchial ultrasound with mediastinal LN aspiration. Dr. Roxan Hockey    12/11/2015 Pathology Results    Trachea biopsy negative, FNA RUL malignant cells c/w SCLC    12/13/2015 - 03/15/2016 Chemotherapy    Cisplatin/Etoposide x 5 cycles.  Cycle #6 cancelled due to significant cytopenias    12/25/2015 Imaging    MRI brain- No intracranial parenchymal enhancing lesion. Tiny right frontal calvarial enhancing lesion. Small metastatic lesion not excluded although this may represent an incidentally detected benign process.    12/29/2015 - 02/13/2016 Radiation Therapy    XRT    02/14/2016 Treatment Plan Change    Chemotherapy deferred x 1 week    02/21/2016 Treatment Plan Change    Carboplatin dose reduced by 20% and Etoposide dose reduced by 10%.    04/24/2016 Imaging    CT CAP- Chest Impression:  1. Marked reduction in mediastinal and RIGHT hilar lymphadenopathy with no residual pathologically enlarged nodes. 2. Resolution of perihilar nodularity in the RIGHT lung which was hypermetabolic on FDG PET. 3. Persistent nodule in the RIGHT upper lobe which is not hypermetabolic. 4. New  ground-glass nodule in the RIGHT upper lobe is nonspecific and may relate to therapy.  Abdomen / Pelvis Impression:  1. No evidence of metastatic disease in the abdomen pelvis. 2. Atherosclerotic calcification of the aorta.    04/24/2016 Imaging    Bone scan- Nonspecific increased tracer localization at approximately T11 vertebral body; this is of uncertain etiology, could represent a degenerative process though metastatic disease is not excluded.    04/29/2016 -  Radiation Therapy    PCI -- 25 Gy    07/24/2016 Imaging    CT CAP- 1. There has been interval decrease in size of mediastinal lymph nodes. The index lesion within the right upper lobe is also decreased in size from previous exam. 2. New subpleural nodule is noted within the posteromedial right lower lobe. Attention on follow-up imaging advised. 3. Stable ground-glass attenuating nodule within the right lower lobe and anteromedial left upper lobe 4. Aortic atherosclerosis and coronary artery calcification    07/29/2016 Imaging    MRI t-spine: 1. Stable benign slightly enhancing sclerotic lesion in the right posterolateral aspect of the T11 vertebral body, unchanged for 5 years. This is felt to account for the subtle increased activity at T11 on the SPECT bone scan. 2. Slight degenerative disc disease with small disc protrusions at T1-2 and T2-3 and T12-L1 with no neural impingement. 3. No evidence of metastatic disease.    09/23/2016 Imaging    CT chest- Radiation changes in the central right upper lobe/ perihilar region.  Stable small mediastinal lymph nodes, unchanged.  No findings suspicious for new/progressive metastatic disease.  12/25/2016 Imaging    CT C/A/P: IMPRESSION: 1. Continued further decrease in mediastinal lymph nodes. 2. Index nodule antero medial right upper lobe has decreased. The new 5 mm right lower lobe subpleural nodule seen on the previous study has resolved in the interval. Ground-glass  nodules in the upper lobes bilaterally are stable. 3. Interval development of interstitial and alveolar opacity in the parahilar right lung is presumably radiation related. Attention on follow-up recommended. 4. New tiny right pleural effusion with areas of apparent pleural enhancement in the posterior right costophrenic sulcus. Close attention on follow-up recommended. 5. Marked abdominal aortic atherosclerosis with likely infrarenal significant stenosis.     07/02/2017 Imaging    CT Chest w/ contrast: Resolution of central right lower lobe infiltrate and right pleural effusion since prior study.  Stable ground-glass opacities in both upper lobes.  No new or progressive disease    11/16/2017 -  Chemotherapy    The patient had palonosetron (ALOXI) injection 0.25 mg, 0.25 mg, Intravenous,  Once, 4 of 4 cycles Administration: 0.25 mg (11/17/2017), 0.25 mg (11/19/2017), 0.25 mg (11/21/2017), 0.25 mg (01/12/2018), 0.25 mg (01/14/2018), 0.25 mg (01/16/2018), 0.25 mg (12/16/2017), 0.25 mg (12/18/2017), 0.25 mg (02/16/2018), 0.25 mg (02/18/2018), 0.25 mg (02/20/2018) azaCITIDine (VIDAZA) 130 mg in sodium chloride 0.9 % 50 mL chemo infusion, 75 mg/m2 = 130 mg, Intravenous, Once, 4 of 4 cycles Dose modification: 67.5 mg/m2 (90 % of original dose 75 mg/m2, Cycle 4, Reason: Provider Judgment, Comment: prolonged cytopenias) Administration: 130 mg (11/17/2017), 130 mg (11/18/2017), 130 mg (11/19/2017), 130 mg (11/20/2017), 130 mg (11/21/2017), 130 mg (12/16/2017), 130 mg (12/17/2017), 130 mg (12/18/2017), 130 mg (12/19/2017), 130 mg (01/12/2018), 130 mg (01/13/2018), 130 mg (01/14/2018), 130 mg (01/15/2018), 130 mg (01/16/2018), 115 mg (02/16/2018), 115 mg (02/17/2018), 115 mg (02/18/2018), 115 mg (02/19/2018), 115 mg (02/20/2018)  for chemotherapy treatment.      MDS (myelodysplastic syndrome), high grade (HCC)   10/20/2017 Initial Diagnosis    MDS (myelodysplastic syndrome), high grade ([place])    11/16/2017 -  Chemotherapy    The  patient had palonosetron (ALOXI) injection 0.25 mg, 0.25 mg, Intravenous,  Once, 4 of 4 cycles Administration: 0.25 mg (11/17/2017), 0.25 mg (11/19/2017), 0.25 mg (11/21/2017), 0.25 mg (01/12/2018), 0.25 mg (01/14/2018), 0.25 mg (01/16/2018), 0.25 mg (12/16/2017), 0.25 mg (12/18/2017), 0.25 mg (02/16/2018), 0.25 mg (02/18/2018), 0.25 mg (02/20/2018) azaCITIDine (VIDAZA) 130 mg in sodium chloride 0.9 % 50 mL chemo infusion, 75 mg/m2 = 130 mg, Intravenous, Once, 4 of 4 cycles Dose modification: 67.5 mg/m2 (90 % of original dose 75 mg/m2, Cycle 4, Reason: Provider Judgment, Comment: prolonged cytopenias) Administration: 130 mg (11/17/2017), 130 mg (11/18/2017), 130 mg (11/19/2017), 130 mg (11/20/2017), 130 mg (11/21/2017), 130 mg (12/16/2017), 130 mg (12/17/2017), 130 mg (12/18/2017), 130 mg (12/19/2017), 130 mg (01/12/2018), 130 mg (01/13/2018), 130 mg (01/14/2018), 130 mg (01/15/2018), 130 mg (01/16/2018), 115 mg (02/16/2018), 115 mg (02/17/2018), 115 mg (02/18/2018), 115 mg (02/19/2018), 115 mg (02/20/2018)  for chemotherapy treatment.       INTERVAL HISTORY:  Ms. Dix 82 y.o. female returns for routine follow-up for MDS. Patient is here today with her daughter. She had a busy weekend at a family reunion and she is fatigued from the activity. She has had some constipation occasionally. Patient reports her appetite is good but her energy level is decreased. Patient denies any new pain. Denies any nausea, vomiting, or diarrhea. Denies any mouth sores. Denies any bleeding.     REVIEW OF SYSTEMS:  Review of Systems  Gastrointestinal:  Positive for constipation.  Neurological: Positive for dizziness.  All other systems reviewed and are negative.    PAST MEDICAL/SURGICAL HISTORY:  Past Medical History:  Diagnosis Date  . Allergy   . Cancer ([place])    lung  . Hyperlipidemia   . Iron deficiency anemia 12/15/2015  . Low vitamin B12 level 12/15/2015  . Osteopenia    Past Surgical History:  Procedure Laterality Date  . CATARACT  EXTRACTION, BILATERAL Bilateral   . COLONOSCOPY N/A 12/06/2016   Procedure: COLONOSCOPY;  Surgeon: Danie Binder, MD;  Location[facility];  Service: Endoscopy;  Laterality: N/A;  1:45pm  . ESOPHAGOGASTRODUODENOSCOPY N/A 12/06/2016   Procedure: ESOPHAGOGASTRODUODENOSCOPY (EGD);  Surgeon: Danie Binder, MD;  Location: [facility];  Service: Endoscopy;  Laterality: N/A;  . MINOR EXCISION EAR CANAL CYST Left    30+ years ago  . SMALL INTESTINE SURGERY    . VIDEO BRONCHOSCOPY WITH ENDOBRONCHIAL ULTRASOUND N/A 12/11/2015   Procedure: VIDEO BRONCHOSCOPY WITH ENDOBRONCHIAL ULTRASOUND;  Surgeon: Melrose Nakayama, MD;  Location: [facility];  Service: Thoracic;  Laterality: N/A;     SOCIAL HISTORY:  Social History   Socioeconomic History  . Marital status: Married    Spouse name: Not on file  . Number of children: Not on file  . Years of education: Not on file  . Highest education level: Not on file  Occupational History  . Not on file  Social Needs  . Financial resource strain: Not on file  . Food insecurity:    Worry: Not on file    Inability: Not on file  . Transportation needs:    Medical: Not on file    Non-medical: Not on file  Tobacco Use  . Smoking status: Former Smoker    Packs/day: 0.25    Years: 50.00    Pack years: 12.50    Last attempt to quit: 01/08/2016    Years since quitting: 2.1  . Smokeless tobacco: Never Used  Substance and Sexual Activity  . Alcohol use: No  . Drug use: No  . Sexual activity: Not Currently  Lifestyle  . Physical activity:    Days per week: Not on file    Minutes per session: Not on file  . Stress: Not on file  Relationships  . Social connections:    Talks on phone: Not on file    Gets together: Not on file    Attends religious service: Not on file    Active member of club or organization: Not on file    Attends meetings of clubs or organizations: Not on file    Relationship status: Not on file  . Intimate partner violence:     Fear of current or ex partner: Not on file    Emotionally abused: Not on file    Physically abused: Not on file    Forced sexual activity: Not on file  Other Topics Concern  . Not on file  Social History Narrative  . Not on file    FAMILY HISTORY:  Family History  Problem Relation Age of Onset  . Cancer Mother   . Hypertension Father   . Colon cancer Neg Hx     CURRENT MEDICATIONS:  Outpatient Encounter Medications as of 03/16/2018  Medication Sig  . cyanocobalamin (,VITAMIN B-12,) 1000 MCG/ML injection Inject 1 mL (1,000 mcg total) into the muscle every 30 (thirty) days.  Marland Kitchen docusate sodium (COLACE) 100 MG capsule Take 100 mg by mouth daily.  Marland Kitchen donepezil (ARICEPT)  10 MG tablet Take 1 tablet (10 mg total) by mouth at bedtime.  . dronabinol (MARINOL) 2.5 MG capsule Take 1 capsule (2.5 mg total) by mouth 2 (two) times daily before a meal.  . memantine (NAMENDA) 5 MG tablet Take 1 tablet (5 mg total) by mouth 2 (two) times daily.  . ondansetron (ZOFRAN) 8 MG tablet Take 1 tablet (8 mg total) by mouth 2 (two) times daily. Start on the third day from the last day of chemotherapy  . prochlorperazine (COMPAZINE) 10 MG tablet Take 1 tablet (10 mg total) by mouth every 6 (six) hours as needed for nausea or vomiting.   No facility-administered encounter medications on file as of 03/16/2018.     ALLERGIES:  Allergies  Allergen Reactions  . Flagyl [Metronidazole Hcl] Nausea Only  . Penicillins Other (See Comments)    Has patient had a PCN reaction causing immediate rash, facial/tongue/throat swelling, SOB or lightheadedness with hypotension: unknown Has patient had a PCN reaction causing severe rash involving mucus membranes or skin necrosis: unknown Has patient had a PCN reaction that required hospitalization unknown Has patient had a PCN reaction occurring within the last 10 years: unknown  If all of the above answers are "NO", then may proceed with Cephalosporin use.     PHYSICAL  EXAM:  ECOG Performance status: 1  Vitals:   03/16/18 1021  BP: (!) 120/52  Pulse: 86  Resp: 16  Temp: (!) 97 F (36.1 C)  SpO2: 100%   Filed Weights   03/16/18 1021  Weight: 137 lb 1.6 oz (62.2 kg)    Physical Exam  Constitutional: She is oriented to person, place, and time. She appears well-developed and well-nourished.  Cardiovascular: Normal rate, regular rhythm and normal heart sounds.  Pulmonary/Chest: Effort normal and breath sounds normal.  Neurological: She is alert and oriented to person, place, and time.  Skin: Skin is warm and dry.     LABORATORY DATA:  I have reviewed the labs as listed.  CBC    Component Value Date/Time   WBC 2.2 (L) 03/16/2018 0944   RBC 2.29 (L) 03/16/2018 0944   HGB 7.5 (L) 03/16/2018 0944   HCT 22.8 (L) 03/16/2018 0944   PLT 68 (L) 03/16/2018 0944   MCV 99.6 03/16/2018 0944   MCV 71.9 (A) 08/20/2013 1559   MCH 32.8 03/16/2018 0944   MCHC 32.9 03/16/2018 0944   RDW 14.9 03/16/2018 0944   LYMPHSABS 0.4 (L) 03/16/2018 0944   MONOABS 0.1 03/16/2018 0944   EOSABS 0.0 03/16/2018 0944   BASOSABS 0.0 03/16/2018 0944   CMP Latest Ref Rng & Units 03/16/2018 02/16/2018 02/09/2018  Glucose 70 - 99 mg/dL 126(H) 103(H) 115(H)  BUN 8 - 23 mg/dL 27(H) 18 22  Creatinine 0.44 - 1.00 mg/dL 1.21(H) 1.25(H) 1.18(H)  Sodium 135 - 145 mmol/L 142 141 140  Potassium 3.5 - 5.1 mmol/L 4.0 3.9 3.9  Chloride 98 - 111 mmol/L 106 109 106  CO2 22 - 32 mmol/L 28 26 27   Calcium 8.9 - 10.3 mg/dL 8.8(L) 8.7(L) 8.5(L)  Total Protein 6.5 - 8.1 g/dL 6.5 6.2(L) 6.1(L)  Total Bilirubin 0.3 - 1.2 mg/dL 0.7 0.7 0.7  Alkaline Phos 38 - 126 U/L 70 65 59  AST 15 - 41 U/L 22 19 18   ALT 0 - 44 U/L 17 11 13         ASSESSMENT & PLAN:   MDS (myelodysplastic syndrome), high grade (HCC) 1. 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  Megakaryocytes are increased with frequent small, hyperlobulated and hyperchromatic forms.  Granulocytic precursors have no significant dysplasia. -Chromosome analysis shows 74, XX, del(3)(q21q29),add(7)(p15),del(13)(q12q14)[6]/46,XX[14] -FISH results show 5 q-, loss of chromosome 7, and trisomy 8. -She received a total of 5 points based on revised IPSS system, with a median survival in the absence of therapy of 1.6 years, 25% AML progression in the absence of therapy and 1.4 years.  She has received chemotherapy with carboplatin and etoposide in 2017.  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  She has been receiving Aranesp for more than a year, dose increased to 200 mcg every 2 weeks in December 2019.  We have given Aranesp at last visit a week ago.  She completely lost response to erythropoiesis stimulating agents. - Azacitidine 75 mg/m x 5 days every 28 days cycle 1 started on 11/17/2017 - Received 1 unit of blood transfusion for hemoglobin of 7 on 11/17/2017, 2 units of blood transfusion on 12/03/2017, on cycle 1 day 14. - Cycle 3 of azacitidine on 01/12/2018. - Cycle 4 of azacitidine was on 02/16/2018.  She did feel dizzy and had some constipation.  Hemoglobin is 7.5.  We will hold off on her next cycle today.  She will receive 1 unit of blood transfusion.  Her platelet count is low at 68.  ANC was within normal limits. -I plan to start her cycle 5 next Tuesday.  As our clinic is closed on Monday, she will only received 4 days of treatment.   2.  Weight loss: She has used Marinol in the past without much help.  Right now her appetite has picked up.  Her weight has been stable.  3.  Small cell lung cancer: She has completed chemoradiation therapy around July 2017.  Her last CT scan was in December 2018.  We will plan to repeat CT scan soon.      Orders placed this encounter:  Orders Placed This Encounter  Procedures  . CBC  with Differential/Platelet  . Comprehensive metabolic panel  . Lactate dehydrogenase  . CBC with Differential/Platelet  . Comprehensive metabolic panel  . Lactate dehydrogenase      Derek Jack, MD [place] [phone]

## 2018-03-16 NOTE — Progress Notes (Signed)
Labs reviewed and pt seen by Dr. Delton Coombes today. Labs, including hgb 7.5 and platelets 68, addressed with Dr. Delton Coombes. Order received to transfuse one unit PRBC today and hold chemo tx until next week. Janet Fletcher tolerated blood transfusion without incident or complaint. VSS upon completion of treatment. Discharged self ambulatory in satisfactory condition in presence of daughter.

## 2018-03-16 NOTE — Patient Instructions (Signed)
[facility] at [facility] Discharge Instructions     Thank you for choosing [facility] at [facility] to provide your oncology and hematology care.  To afford each patient quality time with our provider, please arrive at least 15 minutes before your scheduled appointment time.   If you have a lab appointment with the Cancer Center please come in thru the  Main Entrance and check in at the main information desk  You need to re-schedule your appointment should you arrive 10 or more minutes late.  We strive to give you quality time with our providers, and arriving late affects you and other patients whose appointments are after yours.  Also, if you no show three or more times for appointments you may be dismissed from the clinic at the providers discretion.     Again, thank you for choosing [facility].  Our hope is that these requests will decrease the amount of time that you wait before being seen by our physicians.       _____________________________________________________________  Should you have questions after your visit to [facility], please contact our office at [phone] between the hours of 8:00 a.m. and 4:30 p.m.  Voicemails left after 4:00 p.m. will not be returned until the following business day.  For prescription refill requests, have your pharmacy contact our office and allow 72 hours.    Cancer Center Support Programs:   > Cancer Support Group  2nd Tuesday of the month 1pm-2pm, Journey Room    

## 2018-03-16 NOTE — Patient Instructions (Signed)
[facility] at [facility] Discharge Instructions  Chemo tx held today.Blood transfusion with 1 unit of blood given today. Follow-up as scheduled. Call clinic for any questions or concerns   Thank you for choosing [facility] at [facility] to provide your oncology and hematology care.  To afford each patient quality time with our provider, please arrive at least 15 minutes before your scheduled appointment time.   If you have a lab appointment with the [facility] please come in thru the  Main Entrance and check in at the main information desk  You need to re-schedule your appointment should you arrive 10 or more minutes late.  We strive to give you quality time with our providers, and arriving late affects you and other patients whose appointments are after yours.  Also, if you no show three or more times for appointments you may be dismissed from the clinic at the providers discretion.     Again, thank you for choosing [facility].  Our hope is that these requests will decrease the amount of time that you wait before being seen by our physicians.       _____________________________________________________________  Should you have questions after your visit to [facility], please contact our office at [phone] between the hours of 8:00 a.m. and 4:30 p.m.  Voicemails left after 4:00 p.m. will not be returned until the following business day.  For prescription refill requests, have your pharmacy contact our office and allow 72 hours.    Cancer Center Support Programs:   > Cancer Support Group  2nd Tuesday of the month 1pm-2pm, Journey Room

## 2018-03-17 ENCOUNTER — Ambulatory Visit (HOSPITAL_COMMUNITY): Payer: Medicare Other

## 2018-03-17 LAB — TYPE AND SCREEN
ABO/RH(D): A POS
Antibody Screen: NEGATIVE
UNIT DIVISION: 0

## 2018-03-17 LAB — BPAM RBC
BLOOD PRODUCT EXPIRATION DATE: 201908312359
ISSUE DATE / TIME: 201908261306
Unit Type and Rh: 6200

## 2018-03-18 ENCOUNTER — Ambulatory Visit (HOSPITAL_COMMUNITY): Payer: Medicare Other

## 2018-03-19 ENCOUNTER — Ambulatory Visit (HOSPITAL_COMMUNITY): Payer: Medicare Other

## 2018-03-20 ENCOUNTER — Ambulatory Visit (HOSPITAL_COMMUNITY): Payer: Medicare Other

## 2018-03-24 ENCOUNTER — Inpatient Hospital Stay (HOSPITAL_COMMUNITY): Payer: Medicare Other

## 2018-03-24 ENCOUNTER — Inpatient Hospital Stay (HOSPITAL_COMMUNITY): Payer: Medicare Other | Attending: Hematology

## 2018-03-24 VITALS — BP 101/46 | HR 78 | Temp 97.7°F | Resp 18 | Wt 136.9 lb

## 2018-03-24 DIAGNOSIS — R197 Diarrhea, unspecified: Secondary | ICD-10-CM | POA: Insufficient documentation

## 2018-03-24 DIAGNOSIS — C3491 Malignant neoplasm of unspecified part of right bronchus or lung: Secondary | ICD-10-CM | POA: Insufficient documentation

## 2018-03-24 DIAGNOSIS — D46Z Other myelodysplastic syndromes: Secondary | ICD-10-CM | POA: Diagnosis present

## 2018-03-24 DIAGNOSIS — R531 Weakness: Secondary | ICD-10-CM | POA: Diagnosis not present

## 2018-03-24 DIAGNOSIS — Z5111 Encounter for antineoplastic chemotherapy: Secondary | ICD-10-CM | POA: Insufficient documentation

## 2018-03-24 DIAGNOSIS — R5383 Other fatigue: Secondary | ICD-10-CM | POA: Insufficient documentation

## 2018-03-24 DIAGNOSIS — Z87891 Personal history of nicotine dependence: Secondary | ICD-10-CM | POA: Insufficient documentation

## 2018-03-24 DIAGNOSIS — Z23 Encounter for immunization: Secondary | ICD-10-CM | POA: Diagnosis not present

## 2018-03-24 DIAGNOSIS — R634 Abnormal weight loss: Secondary | ICD-10-CM | POA: Diagnosis not present

## 2018-03-24 LAB — CBC WITH DIFFERENTIAL/PLATELET
BASOS PCT: 0 %
Basophils Absolute: 0 10*3/uL (ref 0.0–0.1)
EOS ABS: 0 10*3/uL (ref 0.0–0.7)
Eosinophils Relative: 2 %
HCT: 25.3 % — ABNORMAL LOW (ref 36.0–46.0)
HEMOGLOBIN: 8.3 g/dL — AB (ref 12.0–15.0)
Lymphocytes Relative: 21 %
Lymphs Abs: 0.6 10*3/uL — ABNORMAL LOW (ref 0.7–4.0)
MCH: 31.7 pg (ref 26.0–34.0)
MCHC: 32.8 g/dL (ref 30.0–36.0)
MCV: 96.6 fL (ref 78.0–100.0)
Monocytes Absolute: 0.3 10*3/uL (ref 0.1–1.0)
Monocytes Relative: 9 %
NEUTROS PCT: 68 %
Neutro Abs: 1.9 10*3/uL (ref 1.7–7.7)
PLATELETS: 67 10*3/uL — AB (ref 150–400)
RBC: 2.62 MIL/uL — ABNORMAL LOW (ref 3.87–5.11)
RDW: 14.8 % (ref 11.5–15.5)
WBC: 2.7 10*3/uL — ABNORMAL LOW (ref 4.0–10.5)

## 2018-03-24 LAB — COMPREHENSIVE METABOLIC PANEL
ALBUMIN: 3.5 g/dL (ref 3.5–5.0)
ALT: 13 U/L (ref 0–44)
AST: 18 U/L (ref 15–41)
Alkaline Phosphatase: 74 U/L (ref 38–126)
Anion gap: 5 (ref 5–15)
BUN: 27 mg/dL — ABNORMAL HIGH (ref 8–23)
CHLORIDE: 108 mmol/L (ref 98–111)
CO2: 29 mmol/L (ref 22–32)
Calcium: 8.7 mg/dL — ABNORMAL LOW (ref 8.9–10.3)
Creatinine, Ser: 1.24 mg/dL — ABNORMAL HIGH (ref 0.44–1.00)
GFR calc Af Amer: 47 mL/min — ABNORMAL LOW (ref >60)
GFR calc non Af Amer: 40 mL/min — ABNORMAL LOW (ref >60)
GLUCOSE: 98 mg/dL (ref 70–99)
POTASSIUM: 4.1 mmol/L (ref 3.5–5.1)
SODIUM: 142 mmol/L (ref 135–145)
Total Bilirubin: 0.6 mg/dL (ref 0.3–1.2)
Total Protein: 6.5 g/dL (ref 6.5–8.1)

## 2018-03-24 LAB — LACTATE DEHYDROGENASE: LDH: 146 U/L (ref 98–192)

## 2018-03-24 MED ORDER — SODIUM CHLORIDE 0.9 % IV SOLN
Freq: Once | INTRAVENOUS | Status: AC
  Administered 2018-03-24: 14:00:00 via INTRAVENOUS

## 2018-03-24 MED ORDER — SODIUM CHLORIDE 0.9 % IV SOLN
75.0000 mg/m2 | Freq: Once | INTRAVENOUS | Status: AC
  Administered 2018-03-24: 130 mg via INTRAVENOUS
  Filled 2018-03-24: qty 13

## 2018-03-24 MED ORDER — SODIUM CHLORIDE 0.9 % IV SOLN
10.0000 mg | Freq: Once | INTRAVENOUS | Status: AC
  Administered 2018-03-24: 10 mg via INTRAVENOUS
  Filled 2018-03-24: qty 1

## 2018-03-24 MED ORDER — PALONOSETRON HCL INJECTION 0.25 MG/5ML
0.2500 mg | Freq: Once | INTRAVENOUS | Status: AC
  Administered 2018-03-24: 0.25 mg via INTRAVENOUS
  Filled 2018-03-24: qty 5

## 2018-03-24 NOTE — Progress Notes (Signed)
Labs reviewed with Dr. Delton Coombes - he is aware of platelet count of 67,000.  Okay to proceed with tx per MD.   Tolerated infusion w/o adverse reaction.  Alert, in no distress.  VSS.  Discharged ambulatory in c/o daughter.

## 2018-03-25 ENCOUNTER — Encounter (HOSPITAL_COMMUNITY): Payer: Self-pay

## 2018-03-25 ENCOUNTER — Other Ambulatory Visit: Payer: Self-pay

## 2018-03-25 ENCOUNTER — Inpatient Hospital Stay (HOSPITAL_COMMUNITY): Payer: Medicare Other

## 2018-03-25 VITALS — BP 103/48 | HR 79 | Temp 97.6°F | Resp 18

## 2018-03-25 DIAGNOSIS — D46Z Other myelodysplastic syndromes: Secondary | ICD-10-CM

## 2018-03-25 DIAGNOSIS — C3491 Malignant neoplasm of unspecified part of right bronchus or lung: Secondary | ICD-10-CM

## 2018-03-25 DIAGNOSIS — Z5111 Encounter for antineoplastic chemotherapy: Secondary | ICD-10-CM | POA: Diagnosis not present

## 2018-03-25 MED ORDER — SODIUM CHLORIDE 0.9 % IV SOLN
Freq: Once | INTRAVENOUS | Status: AC
  Administered 2018-03-25: 13:00:00 via INTRAVENOUS

## 2018-03-25 MED ORDER — SODIUM CHLORIDE 0.9 % IV SOLN
75.0000 mg/m2 | Freq: Once | INTRAVENOUS | Status: AC
  Administered 2018-03-25: 130 mg via INTRAVENOUS
  Filled 2018-03-25: qty 13

## 2018-03-25 MED ORDER — SODIUM CHLORIDE 0.9% FLUSH: 10.0000 mL | INTRAVENOUS | Status: DC | PRN

## 2018-03-25 MED ORDER — SODIUM CHLORIDE 0.9 % IV SOLN
10.0000 mg | Freq: Once | INTRAVENOUS | Status: AC
  Administered 2018-03-25: 10 mg via INTRAVENOUS
  Filled 2018-03-25: qty 1

## 2018-03-25 NOTE — Progress Notes (Signed)
Tolerated infusion w/o adverse reaction.  Alert, in no distress.  Discharged ambulatory in c/o daughter.

## 2018-03-26 ENCOUNTER — Inpatient Hospital Stay (HOSPITAL_COMMUNITY): Payer: Medicare Other

## 2018-03-26 VITALS — BP 98/49 | HR 83 | Temp 97.7°F | Resp 16

## 2018-03-26 DIAGNOSIS — Z5111 Encounter for antineoplastic chemotherapy: Secondary | ICD-10-CM | POA: Diagnosis not present

## 2018-03-26 DIAGNOSIS — D46Z Other myelodysplastic syndromes: Secondary | ICD-10-CM

## 2018-03-26 DIAGNOSIS — C3491 Malignant neoplasm of unspecified part of right bronchus or lung: Secondary | ICD-10-CM

## 2018-03-26 MED ORDER — PALONOSETRON HCL INJECTION 0.25 MG/5ML
INTRAVENOUS | Status: AC
  Filled 2018-03-26: qty 5

## 2018-03-26 MED ORDER — SODIUM CHLORIDE 0.9 % IV SOLN
10.0000 mg | Freq: Once | INTRAVENOUS | Status: AC
  Administered 2018-03-26: 10 mg via INTRAVENOUS
  Filled 2018-03-26: qty 1

## 2018-03-26 MED ORDER — PALONOSETRON HCL INJECTION 0.25 MG/5ML
0.2500 mg | Freq: Once | INTRAVENOUS | Status: AC
  Administered 2018-03-26: 0.25 mg via INTRAVENOUS

## 2018-03-26 MED ORDER — SODIUM CHLORIDE 0.9% FLUSH
10.0000 mL | INTRAVENOUS | Status: DC | PRN
  Administered 2018-03-26: 10 mL
  Filled 2018-03-26: qty 10

## 2018-03-26 MED ORDER — SODIUM CHLORIDE 0.9 % IV SOLN
75.0000 mg/m2 | Freq: Once | INTRAVENOUS | Status: AC
  Administered 2018-03-26: 130 mg via INTRAVENOUS
  Filled 2018-03-26: qty 3

## 2018-03-26 MED ORDER — SODIUM CHLORIDE 0.9 % IV SOLN
Freq: Once | INTRAVENOUS | Status: AC
  Administered 2018-03-26: 14:00:00 via INTRAVENOUS

## 2018-03-26 MED ORDER — CYANOCOBALAMIN 1000 MCG/ML IJ SOLN: 1000.0000 ug | INTRAMUSCULAR | 2 refills | Status: AC

## 2018-03-26 NOTE — Patient Instructions (Signed)
[facility] Discharge Instructions for Patients Receiving Chemotherapy   Beginning January 23rd 2017 lab work for the Cancer Center will be done in the  Main lab at [facility] on 1st floor. If you have a lab appointment with the Cancer Center please come in thru the  Main Entrance and check in at the main information desk   Today you received the following chemotherapy agents   To help prevent nausea and vomiting after your treatment, we encourage you to take your nausea medication     If you develop nausea and vomiting, or diarrhea that is not controlled by your medication, call the clinic.  The clinic phone number is [phone]. Office hours are Monday-Friday 8:30am-5:00pm.  BELOW ARE SYMPTOMS THAT SHOULD BE REPORTED IMMEDIATELY:  *FEVER GREATER THAN 101.0 F  *CHILLS WITH OR WITHOUT FEVER  NAUSEA AND VOMITING THAT IS NOT CONTROLLED WITH YOUR NAUSEA MEDICATION  *UNUSUAL SHORTNESS OF BREATH  *UNUSUAL BRUISING OR BLEEDING  TENDERNESS IN MOUTH AND THROAT WITH OR WITHOUT PRESENCE OF ULCERS  *URINARY PROBLEMS  *BOWEL PROBLEMS  UNUSUAL RASH Items with * indicate a potential emergency and should be followed up as soon as possible. If you have an emergency after office hours please contact your primary care physician or go to the nearest emergency department.  Please call the clinic during office hours if you have any questions or concerns.   You may also contact the Patient Navigator at [phone] should you have any questions or need assistance in obtaining follow up care.      Resources For Cancer Patients and their Caregivers ? American Cancer Society: Can assist with transportation, wigs, general needs, runs Look Good Feel Better.        1-888-227-6333 ? Cancer Care: Provides financial assistance, online support groups, medication/co-pay assistance.  1-800-813-HOPE (4673) ? [facility] Assists [place] cancer  patients and their families through emotional , educational and financial support.  336-427-4357 ? [place] DSS Where to apply for food stamps, Medicaid and utility assistance. 336-342-1394 ? [facility]: Transportation to medical appointments. 336-347-2287 ? Social Security Administration: May apply for disability if have a Stage IV cancer. 336-342-7796 1-800-772-1213 ? [place] Aging, Disability and Transit Services: Assists with nutrition, care and transit needs. 336-349-2343         

## 2018-03-26 NOTE — Progress Notes (Unsigned)
Treatment given per orders. Patient tolerated it well without problems. Vitals stable and discharged home from clinic ambulatory. Follow up as scheduled.  

## 2018-03-27 ENCOUNTER — Inpatient Hospital Stay (HOSPITAL_COMMUNITY): Payer: Medicare Other

## 2018-03-27 ENCOUNTER — Ambulatory Visit (HOSPITAL_COMMUNITY): Payer: Medicare Other

## 2018-03-27 VITALS — BP 104/45 | HR 79 | Temp 97.6°F | Resp 16

## 2018-03-27 DIAGNOSIS — Z5111 Encounter for antineoplastic chemotherapy: Secondary | ICD-10-CM | POA: Diagnosis not present

## 2018-03-27 DIAGNOSIS — D46Z Other myelodysplastic syndromes: Secondary | ICD-10-CM

## 2018-03-27 DIAGNOSIS — C3491 Malignant neoplasm of unspecified part of right bronchus or lung: Secondary | ICD-10-CM

## 2018-03-27 MED ORDER — SODIUM CHLORIDE 0.9 % IV SOLN
Freq: Once | INTRAVENOUS | Status: AC
  Administered 2018-03-27: 13:00:00 via INTRAVENOUS

## 2018-03-27 MED ORDER — SODIUM CHLORIDE 0.9 % IV SOLN
10.0000 mg | Freq: Once | INTRAVENOUS | Status: AC
  Administered 2018-03-27: 10 mg via INTRAVENOUS
  Filled 2018-03-27: qty 1

## 2018-03-27 MED ORDER — SODIUM CHLORIDE 0.9 % IV SOLN
75.0000 mg/m2 | Freq: Once | INTRAVENOUS | Status: AC
  Administered 2018-03-27: 130 mg via INTRAVENOUS
  Filled 2018-03-27: qty 13

## 2018-03-27 MED ORDER — HEPARIN SOD (PORK) LOCK FLUSH 100 UNIT/ML IV SOLN
500.0000 [IU] | Freq: Once | INTRAVENOUS | Status: AC | PRN
  Administered 2018-03-27: 500 [IU]

## 2018-03-27 MED ORDER — SODIUM CHLORIDE 0.9% FLUSH
10.0000 mL | INTRAVENOUS | Status: DC | PRN
  Administered 2018-03-27: 10 mL
  Filled 2018-03-27: qty 10

## 2018-03-27 NOTE — Progress Notes (Signed)
Treatment given per orders. Patient tolerated it well without problems. Vitals stable and discharged home from clinic ambulatory. Follow up as scheduled.  

## 2018-03-30 NOTE — Patient Instructions (Signed)
[facility] Discharge Instructions for Patients Receiving Chemotherapy   Beginning January 23rd 2017 lab work for the Cancer Center will be done in the  Main lab at [facility] on 1st floor. If you have a lab appointment with the Cancer Center please come in thru the  Main Entrance and check in at the main information desk   Today you received the following chemotherapy agents   To help prevent nausea and vomiting after your treatment, we encourage you to take your nausea medication     If you develop nausea and vomiting, or diarrhea that is not controlled by your medication, call the clinic.  The clinic phone number is [phone]. Office hours are Monday-Friday 8:30am-5:00pm.  BELOW ARE SYMPTOMS THAT SHOULD BE REPORTED IMMEDIATELY:  *FEVER GREATER THAN 101.0 F  *CHILLS WITH OR WITHOUT FEVER  NAUSEA AND VOMITING THAT IS NOT CONTROLLED WITH YOUR NAUSEA MEDICATION  *UNUSUAL SHORTNESS OF BREATH  *UNUSUAL BRUISING OR BLEEDING  TENDERNESS IN MOUTH AND THROAT WITH OR WITHOUT PRESENCE OF ULCERS  *URINARY PROBLEMS  *BOWEL PROBLEMS  UNUSUAL RASH Items with * indicate a potential emergency and should be followed up as soon as possible. If you have an emergency after office hours please contact your primary care physician or go to the nearest emergency department.  Please call the clinic during office hours if you have any questions or concerns.   You may also contact the Patient Navigator at [phone] should you have any questions or need assistance in obtaining follow up care.      Resources For Cancer Patients and their Caregivers ? American Cancer Society: Can assist with transportation, wigs, general needs, runs Look Good Feel Better.        1-888-227-6333 ? Cancer Care: Provides financial assistance, online support groups, medication/co-pay assistance.  1-800-813-HOPE (4673) ? [facility] Assists [place] cancer  patients and their families through emotional , educational and financial support.  336-427-4357 ? [place] DSS Where to apply for food stamps, Medicaid and utility assistance. 336-342-1394 ? [facility]: Transportation to medical appointments. 336-347-2287 ? Social Security Administration: May apply for disability if have a Stage IV cancer. 336-342-7796 1-800-772-1213 ? [place] Aging, Disability and Transit Services: Assists with nutrition, care and transit needs. 336-349-2343         

## 2018-03-31 ENCOUNTER — Ambulatory Visit (INDEPENDENT_AMBULATORY_CARE_PROVIDER_SITE_OTHER): Payer: Medicare Other | Admitting: Nurse Practitioner

## 2018-03-31 ENCOUNTER — Telehealth: Payer: Self-pay

## 2018-03-31 ENCOUNTER — Encounter: Payer: Self-pay | Admitting: Nurse Practitioner

## 2018-03-31 VITALS — BP 107/65 | HR 83 | Temp 96.5°F | Ht 64.0 in | Wt 139.0 lb

## 2018-03-31 DIAGNOSIS — N183 Chronic kidney disease, stage 3 unspecified: Secondary | ICD-10-CM

## 2018-03-31 DIAGNOSIS — R5383 Other fatigue: Secondary | ICD-10-CM

## 2018-03-31 DIAGNOSIS — D508 Other iron deficiency anemias: Secondary | ICD-10-CM

## 2018-03-31 DIAGNOSIS — C3491 Malignant neoplasm of unspecified part of right bronchus or lung: Secondary | ICD-10-CM | POA: Diagnosis not present

## 2018-03-31 DIAGNOSIS — E785 Hyperlipidemia, unspecified: Secondary | ICD-10-CM | POA: Diagnosis not present

## 2018-03-31 DIAGNOSIS — D631 Anemia in chronic kidney disease: Secondary | ICD-10-CM

## 2018-03-31 DIAGNOSIS — R413 Other amnesia: Secondary | ICD-10-CM | POA: Diagnosis not present

## 2018-03-31 DIAGNOSIS — F411 Generalized anxiety disorder: Secondary | ICD-10-CM

## 2018-03-31 LAB — HEMOGLOBIN, FINGERSTICK: Hemoglobin: 8.1 g/dL — ABNORMAL LOW (ref 11.1–15.9)

## 2018-03-31 MED ORDER — MEMANTINE HCL 5 MG PO TABS: 5.0000 mg | ORAL_TABLET | Freq: Two times a day (BID) | ORAL | 1 refills | Status: AC

## 2018-03-31 MED ORDER — DONEPEZIL HCL 10 MG PO TABS: 10.0000 mg | ORAL_TABLET | Freq: Every day | ORAL | 1 refills | Status: DC

## 2018-03-31 NOTE — Telephone Encounter (Signed)
Patient was in our office today to see her PCP. Her hgb was 8.1. PCP wanted you to be aware.

## 2018-03-31 NOTE — Progress Notes (Signed)
Subjective:    Patient ID: Janet Fletcher, female    DOB: Jun 04, 1936, 82 y.o.   MRN: [id number]   Chief Complaint: Medical Management of Chronic Issues   HPI:  1. Small cell carcinoma of right lung [facility])  Patient is still receiving chemotherapy. According to chart she goes daily for 5 days then has a  3 week break. She is doing okay. Is very weak., tired and confused for several days after she completes treatments . 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  2. Mild memory disturbance  patient is currently on namenda and aricept  3. Other iron deficiency anemia  Anemia is caused by the type of cancer that she has. Last hgb was 8.3 1 week ago.  4. Hyperlipidemia with target LDL less than 100  Has a very poor appetite so they just give her whatever she will eat.  5. GAD (generalized anxiety disorder)  Is currently not on anything. Family says she is doing ok despite what she is going through with her cancer treatments.  6. Fatigue, unspecified type  This is probably due to chemo that she is on.  7. Anemia in stage 3 chronic kidney disease ([place])  Last creatine was 1.24. With hgb of 8.3. She gets labs checked weekly by oncology.    Outpatient Encounter Medications as of 03/31/2018  Medication Sig  . cyanocobalamin (,VITAMIN B-12,) 1000 MCG/ML injection Inject 1 mL (1,000 mcg total) into the muscle every 30 (thirty) days.  Marland Kitchen docusate sodium (COLACE) 100 MG capsule Take 100 mg by mouth daily.  Marland Kitchen donepezil (ARICEPT) 10 MG tablet Take 1 tablet (10 mg total) by mouth at bedtime.  . dronabinol (MARINOL) 2.5 MG capsule Take 1 capsule (2.5 mg total) by mouth 2 (two) times daily before a meal.  . memantine (NAMENDA) 5 MG tablet Take 1 tablet (5 mg total) by mouth 2 (two) times daily.  . ondan setron (ZOFRAN) 8 MG tablet Take 1 tablet (8 mg total) by mouth 2 (two) times daily. Start on the third day from the last day of chemotherapy  . prochlorperazine (COMPAZINE) 10 MG  tablet Take 1 tablet (10 mg total) by mouth every 6 (six) hours as needed for nausea or vomiting.      New complaints: none  Social history: Husband is with her all the time and she has several daughters that check on her daily.   Review of Systems  Constitutional: Positive for fatigue. Negative for activity change and appetite change.  HENT: Negative.   Eyes: Negative for pain.  Respiratory: Positive for shortness of breath.   Cardiovascular: Negative for chest pain, palpitations and leg swelling.  Gastrointestinal: Negative for abdominal pain.  Endocrine: Negative for polydipsia.  Genitourinary: Negative.   Skin: Negative for rash.  Neurological: Negative for dizziness, weakness and headaches.  Hematological: Does not bruise/bleed easily.  Psychiatric/Behavioral: Negative.   All other systems reviewed and are negative.      Objective:   Physical Exam  Constitutional: She is oriented to person, place, and time. She appears well-developed and well-nourished. No distress.  HENT:  Right Ear: External ear normal.  Left Ear: External ear normal.  Nose: Nose normal.  Mouth/Throat: Oropharynx is clear and moist.  Eyes: Pupils are equal, round, and reactive to light.  Neck: Normal range of motion. Neck supple.  Cardiovascular: Normal rate and regular rhythm.  Pulmonary/Chest: Effort normal.  Abdominal: Soft.  Musculoskeletal: Normal range of motion.  Neurological: She  is alert and oriented to person, place, and time.  Goes in and out of orientation to place and time.  Skin: Skin is warm.  Psychiatric: She has a normal mood and affect. Her behavior is normal. Thought content normal.  Nursing note and vitals reviewed.  BP 107/65   Pulse 83   Temp (!) 96.5 F (35.8 C) (Oral)   Ht 5\' 4"  (1.626 m)   Wt 139 lb (63 kg)   BMI 23.86 kg/m   hgb 8.1      Assessment & Plan:  Janet Fletcher comes in today with chief complaint of Medical Management of Chronic  Issues   Diagnosis and orders addressed:  1. Small cell carcinoma of right lung ([place]) keep follow up appointments with oncology  2. Mild memory disturbance Continue to orient daily  3. Other iron deficiency anemia Oncology is dealing with this- I will let them know what her hgb is  4. Hyperlipidemia with target LDL less than 100 Low fat diet  5. GAD (generalized anxiety disorder) Stress management  6. Fatigue, unspecified type rest  7. Anemia in stage 3 chronic kidney disease ([facility]) - Hemoglobin, fingerstick  8. Memory deficit - memantine (NAMENDA) 5 MG tablet; Take 1 tablet (5 mg total) by mouth 2 (two) times daily.  Dispense: 180 tablet; Refill: 1 - donepezil (ARICEPT) 10 MG tablet; Take 1 tablet (10 mg total) by mouth at bedtime.  Dispense: 90 tablet; Refill: 1   Labs pending Health Maintenance reviewed Diet and exercise encouraged  Follow up plan: 3 months   Mary-Margaret Hassell Done, FNP

## 2018-03-31 NOTE — Patient Instructions (Signed)
Anemia Anemia is a condition in which you do not have enough red blood cells or hemoglobin. Hemoglobin is a substance in red blood cells that carries oxygen. When you do not have enough red blood cells or hemoglobin (are anemic), your body cannot get enough oxygen and your organs may not work properly. As a result, you may feel very tired or have other problems. What are the causes? Common causes of anemia include:  Excessive bleeding. Anemia can be caused by excessive bleeding inside or outside the body, including bleeding from the intestine or from periods in women.  Poor nutrition.  Long-lasting (chronic) kidney, thyroid, and liver disease.  Bone marrow disorders.  Cancer and treatments for cancer.  HIV (human immunodeficiency virus) and AIDS (acquired immunodeficiency syndrome).  Treatments for HIV and AIDS.  Spleen problems.  Blood disorders.  Infections, medicines, and autoimmune disorders that destroy red blood cells.  What are the signs or symptoms? Symptoms of this condition include:  Minor weakness.  Dizziness.  Headache.  Feeling heartbeats that are irregular or faster than normal (palpitations).  Shortness of breath, especially with exercise.  Paleness.  Cold sensitivity.  Indigestion.  Nausea.  Difficulty sleeping.  Difficulty concentrating.  Symptoms may occur suddenly or develop slowly. If your anemia is mild, you may not have symptoms. How is this diagnosed? This condition is diagnosed based on:  Blood tests.  Your medical history.  A physical exam.  Bone marrow biopsy.  Your health care provider may also check your stool (feces) for blood and may do additional testing to look for the cause of your bleeding. You may also have other tests, including:  Imaging tests, such as a CT scan or MRI.  Endoscopy.  Colonoscopy.  How is this treated? Treatment for this condition depends on the cause. If you continue to lose a lot of blood,  you may need to be treated at a hospital. Treatment may include:  Taking supplements of iron, vitamin B12, or folic acid.  Taking a hormone medicine (erythropoietin) that can help to stimulate red blood cell growth.  Having a blood transfusion. This may be needed if you lose a lot of blood.  Making changes to your diet.  Having surgery to remove your spleen.  Follow these instructions at home:  Take over-the-counter and prescription medicines only as told by your health care provider.  Take supplements only as told by your health care provider.  Follow any diet instructions that you were given.  Keep all follow-up visits as told by your health care provider. This is important. Contact a health care provider if:  You develop new bleeding anywhere in the body. Get help right away if:  You are very weak.  You are short of breath.  You have pain in your abdomen or chest.  You are dizzy or feel faint.  You have trouble concentrating.  You have bloody or black, tarry stools.  You vomit repeatedly or you vomit up blood. Summary  Anemia is a condition in which you do not have enough red blood cells or enough of a substance in your red blood cells that carries oxygen (hemoglobin).  Symptoms may occur suddenly or develop slowly.  If your anemia is mild, you may not have symptoms.  This condition is diagnosed with blood tests as well as a medical history and physical exam. Other tests may be needed.  Treatment for this condition depends on the cause of the anemia. This information is not intended to replace advice   given to you by your health care provider. Make sure you discuss any questions you have with your health care provider. Document Released: 08/15/2004 Document Revised: 08/09/2016 Document Reviewed: 08/09/2016 Elsevier Interactive Patient Education  [employer].

## 2018-04-20 ENCOUNTER — Inpatient Hospital Stay (HOSPITAL_COMMUNITY): Payer: Medicare Other

## 2018-04-20 ENCOUNTER — Inpatient Hospital Stay (HOSPITAL_BASED_OUTPATIENT_CLINIC_OR_DEPARTMENT_OTHER): Payer: Medicare Other | Admitting: Hematology

## 2018-04-20 ENCOUNTER — Other Ambulatory Visit: Payer: Self-pay | Admitting: Nurse Practitioner

## 2018-04-20 ENCOUNTER — Other Ambulatory Visit: Payer: Self-pay

## 2018-04-20 ENCOUNTER — Encounter (HOSPITAL_COMMUNITY): Payer: Self-pay | Admitting: Hematology

## 2018-04-20 VITALS — BP 107/53 | HR 70 | Temp 97.5°F | Resp 18

## 2018-04-20 VITALS — BP 106/54 | HR 96 | Temp 96.8°F | Resp 17 | Wt 137.2 lb

## 2018-04-20 DIAGNOSIS — R5383 Other fatigue: Secondary | ICD-10-CM | POA: Diagnosis not present

## 2018-04-20 DIAGNOSIS — D469 Myelodysplastic syndrome, unspecified: Secondary | ICD-10-CM

## 2018-04-20 DIAGNOSIS — R531 Weakness: Secondary | ICD-10-CM

## 2018-04-20 DIAGNOSIS — D649 Anemia, unspecified: Secondary | ICD-10-CM

## 2018-04-20 DIAGNOSIS — D46Z Other myelodysplastic syndromes: Secondary | ICD-10-CM | POA: Diagnosis not present

## 2018-04-20 DIAGNOSIS — Z87891 Personal history of nicotine dependence: Secondary | ICD-10-CM

## 2018-04-20 DIAGNOSIS — Z9289 Personal history of other medical treatment: Secondary | ICD-10-CM

## 2018-04-20 DIAGNOSIS — R413 Other amnesia: Secondary | ICD-10-CM

## 2018-04-20 DIAGNOSIS — R197 Diarrhea, unspecified: Secondary | ICD-10-CM | POA: Diagnosis not present

## 2018-04-20 DIAGNOSIS — C3491 Malignant neoplasm of unspecified part of right bronchus or lung: Secondary | ICD-10-CM | POA: Diagnosis not present

## 2018-04-20 DIAGNOSIS — Z5111 Encounter for antineoplastic chemotherapy: Secondary | ICD-10-CM | POA: Diagnosis not present

## 2018-04-20 DIAGNOSIS — R634 Abnormal weight loss: Secondary | ICD-10-CM

## 2018-04-20 LAB — CBC WITH DIFFERENTIAL/PLATELET
BASOS ABS: 0 10*3/uL (ref 0.0–0.1)
Basophils Relative: 1 %
Eosinophils Absolute: 0 10*3/uL (ref 0.0–0.7)
Eosinophils Relative: 1 %
HEMATOCRIT: 17.3 % — AB (ref 36.0–46.0)
HEMOGLOBIN: 5.9 g/dL — AB (ref 12.0–15.0)
LYMPHS ABS: 0.4 10*3/uL — AB (ref 0.7–4.0)
LYMPHS PCT: 21 %
MCH: 32.2 pg (ref 26.0–34.0)
MCHC: 34.1 g/dL (ref 30.0–36.0)
MCV: 94.5 fL (ref 78.0–100.0)
Monocytes Absolute: 0.1 10*3/uL (ref 0.1–1.0)
Monocytes Relative: 8 %
NEUTROS ABS: 1.2 10*3/uL — AB (ref 1.7–7.7)
NEUTROS PCT: 69 %
Platelets: 44 10*3/uL — ABNORMAL LOW (ref 150–400)
RBC: 1.83 MIL/uL — AB (ref 3.87–5.11)
RDW: 14.3 % (ref 11.5–15.5)
WBC: 1.7 10*3/uL — AB (ref 4.0–10.5)

## 2018-04-20 LAB — SAMPLE TO BLOOD BANK

## 2018-04-20 LAB — COMPREHENSIVE METABOLIC PANEL
ALK PHOS: 68 U/L (ref 38–126)
ALT: 19 U/L (ref 0–44)
AST: 24 U/L (ref 15–41)
Albumin: 3.3 g/dL — ABNORMAL LOW (ref 3.5–5.0)
Anion gap: 6 (ref 5–15)
BUN: 29 mg/dL — ABNORMAL HIGH (ref 8–23)
CHLORIDE: 109 mmol/L (ref 98–111)
CO2: 26 mmol/L (ref 22–32)
Calcium: 8.7 mg/dL — ABNORMAL LOW (ref 8.9–10.3)
Creatinine, Ser: 1.42 mg/dL — ABNORMAL HIGH (ref 0.44–1.00)
GFR, EST AFRICAN AMERICAN: 39 mL/min — AB (ref >60)
GFR, EST NON AFRICAN AMERICAN: 34 mL/min — AB (ref >60)
Glucose, Bld: 135 mg/dL — ABNORMAL HIGH (ref 70–99)
Potassium: 4 mmol/L (ref 3.5–5.1)
Sodium: 141 mmol/L (ref 135–145)
Total Bilirubin: 0.9 mg/dL (ref 0.3–1.2)
Total Protein: 6.3 g/dL — ABNORMAL LOW (ref 6.5–8.1)

## 2018-04-20 LAB — PREPARE RBC (CROSSMATCH)

## 2018-04-20 LAB — LACTATE DEHYDROGENASE: LDH: 245 U/L — ABNORMAL HIGH (ref 98–192)

## 2018-04-20 MED ORDER — ACETAMINOPHEN 325 MG PO TABS
650.0000 mg | ORAL_TABLET | Freq: Once | ORAL | Status: AC
  Administered 2018-04-20: 650 mg via ORAL
  Filled 2018-04-20: qty 2

## 2018-04-20 MED ORDER — INFLUENZA VAC SPLIT HIGH-DOSE 0.5 ML IM SUSY
0.5000 mL | PREFILLED_SYRINGE | INTRAMUSCULAR | Status: AC
  Administered 2018-04-20: 0.5 mL via INTRAMUSCULAR
  Filled 2018-04-20: qty 0.5

## 2018-04-20 MED ORDER — DIPHENHYDRAMINE HCL 25 MG PO CAPS
25.0000 mg | ORAL_CAPSULE | Freq: Once | ORAL | Status: AC
  Administered 2018-04-20: 25 mg via ORAL
  Filled 2018-04-20: qty 1

## 2018-04-20 MED ORDER — DONEPEZIL HCL 10 MG PO TABS: 10.0000 mg | ORAL_TABLET | Freq: Every day | ORAL | 0 refills | Status: DC

## 2018-04-20 MED ORDER — HEPARIN SOD (PORK) LOCK FLUSH 100 UNIT/ML IV SOLN
500.0000 [IU] | Freq: Every day | INTRAVENOUS | Status: DC | PRN
  Filled 2018-04-20: qty 5

## 2018-04-20 MED ORDER — SODIUM CHLORIDE 0.9% IV SOLUTION
250.0000 mL | Freq: Once | INTRAVENOUS | Status: AC
  Administered 2018-04-20: 250 mL via INTRAVENOUS

## 2018-04-20 NOTE — Assessment & Plan Note (Addendum)
1. 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  Megakaryocytes are increased with frequent small, hyperlobulated and hyperchromatic forms.  Granulocytic precursors have no significant dysplasia. 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 -She received a total of 5 points based on revised IPSS system, with a median survival in the absence of therapy of 1.6 years, 25% AML progression in the absence of therapy and 1.4 years.  She has received chemotherapy with carboplatin and etoposide in 2017.  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  She has been receiving Aranesp for more than a year, dose increased to 200 mcg every 2 weeks in December 2019.  We have given Aranesp at last visit a week ago.  She completely lost response to erythropoiesis stimulating agents. - Azacitidine 75 mg/m x 5 days every 28 days cycle 1 started on 11/17/2017 - Received 1 unit of blood transfusion for hemoglobin of 7 on 11/17/2017, 2 units of blood transfusion on 12/03/2017, on cycle 1 day 14. - Cycle 3 of azacitidine on 01/12/2018. - Cycle 4 was on 02/16/2018. Cycle 5 was on 03/24/2018. - Today her hemoglobin is 5.9.  She is severely weak.  She will receive 2 units of blood transfusion.  She reports having black stools over the weekend.  We will check her stool for occult blood.  We will plan to delay her next cycle by 1 week.  We will check her blood counts next Monday to initiate her next cycle.  She will receive high-dose flu vaccine today.  2.  Weight loss: She has used Marinol in the past without much help.  Her appetite usually picks up after 2 weeks of each chemo.  She is drinking 4 cans of boost per day.  3.   Small cell lung cancer: She completed chemoradiation therapy around July 2017.  Last CT of the chest was done in December 2018.  We will order CT scan of her chest with contrast for follow-up.

## 2018-04-20 NOTE — Patient Instructions (Signed)
[facility] at [facility] Discharge Instructions     Thank you for choosing [facility] at [facility] to provide your oncology and hematology care.  To afford each patient quality time with our provider, please arrive at least 15 minutes before your scheduled appointment time.   If you have a lab appointment with the Cancer Center please come in thru the  Main Entrance and check in at the main information desk  You need to re-schedule your appointment should you arrive 10 or more minutes late.  We strive to give you quality time with our providers, and arriving late affects you and other patients whose appointments are after yours.  Also, if you no show three or more times for appointments you may be dismissed from the clinic at the providers discretion.     Again, thank you for choosing [facility].  Our hope is that these requests will decrease the amount of time that you wait before being seen by our physicians.       _____________________________________________________________  Should you have questions after your visit to [facility], please contact our office at [phone] between the hours of 8:00 a.m. and 4:30 p.m.  Voicemails left after 4:00 p.m. will not be returned until the following business day.  For prescription refill requests, have your pharmacy contact our office and allow 72 hours.    Cancer Center Support Programs:   > Cancer Support Group  2nd Tuesday of the month 1pm-2pm, Journey Room    

## 2018-04-20 NOTE — Telephone Encounter (Signed)
What is the name of the medication? donepezil (ARICEPT) 10 MG tablet  Have you contacted your pharmacy to request a refill? yes  Which pharmacy would you like this sent to? [facility]   Patient notified that their request is being sent to the clinical staff for review and that they should receive a call once it is complete. If they do not receive a call within 24 hours they can check with their pharmacy or our office.

## 2018-04-20 NOTE — Progress Notes (Signed)
CRITICAL VALUE ALERT  Critical Value:  Hgb 5.9  Date & Time Notied:  04/20/18 at 1003  Provider Notified: Dr. Delton Coombes  Orders Received/Actions taken: orders rec'd to transfuse 2 units PRBC   Tolerated transfusion of 1 unit PRBC w/o adverse reaction.  Alert, in no distress.  VSS.  Discharged ambulatory in c/o daughter.  Pt will return tomorrow to receive 2nd unit PRBC.

## 2018-04-20 NOTE — Progress Notes (Signed)
Janet Fletcher, Janet Fletcher [id number]   CLINIC:  Medical Oncology/Hematology  PCP:  Chevis Pretty, [address] [name] [address] [phone]   REASON FOR VISIT: Follow-up for MDS high grade  CURRENT THERAPY: Azacitidine every 28 days  BRIEF ONCOLOGIC HISTORY:    Small cell carcinoma of right lung ([place])   11/28/2015 Imaging    Large anterior mediastinal mass in continuity with a R hilar mass, narrowing of SVC, abnormal densities in RUL, 9 mm spiculated nodule, nonspec. hypodensity in liver    12/05/2015 PET scan    Large hypermetabolic paratracheal mass c/w SCLC, perihilar nodular densities in RML, mild metabolic activity RUL nodule, no distant metastatic disease    12/11/2015 Procedure    Video bronch with biopsies and brushings, endobronchial ultrasound with mediastinal LN aspiration. Dr. Roxan Hockey    12/11/2015 Pathology Results    Trachea biopsy negative, FNA RUL malignant cells c/w SCLC    12/13/2015 - 03/15/2016 Chemotherapy    Cisplatin/Etoposide x 5 cycles.  Cycle #6 cancelled due to significant cytopenias    12/25/2015 Imaging    MRI brain- No intracranial parenchymal enhancing lesion. Tiny right frontal calvarial enhancing lesion. Small metastatic lesion not excluded although this may represent an incidentally detected benign process.    12/29/2015 - 02/13/2016 Radiation Therapy    XRT    02/14/2016 Treatment Plan Change    Chemotherapy deferred x 1 week    02/21/2016 Treatment Plan Change    Carboplatin dose reduced by 20% and Etoposide dose reduced by 10%.    04/24/2016 Imaging    CT CAP- Chest Impression:  1. Marked reduction in mediastinal and RIGHT hilar lymphadenopathy with no residual pathologically enlarged nodes. 2. Resolution of perihilar nodularity in the RIGHT lung which was hypermetabolic on FDG PET. 3. Persistent nodule in the RIGHT upper lobe which is not hypermetabolic. 4. New ground-glass nodule  in the RIGHT upper lobe is nonspecific and may relate to therapy.  Abdomen / Pelvis Impression:  1. No evidence of metastatic disease in the abdomen pelvis. 2. Atherosclerotic calcification of the aorta.    04/24/2016 Imaging    Bone scan- Nonspecific increased tracer localization at approximately T11 vertebral body; this is of uncertain etiology, could represent a degenerative process though metastatic disease is not excluded.    04/29/2016 -  Radiation Therapy    PCI -- 25 Gy    07/24/2016 Imaging    CT CAP- 1. There has been interval decrease in size of mediastinal lymph nodes. The index lesion within the right upper lobe is also decreased in size from previous exam. 2. New subpleural nodule is noted within the posteromedial right lower lobe. Attention on follow-up imaging advised. 3. Stable ground-glass attenuating nodule within the right lower lobe and anteromedial left upper lobe 4. Aortic atherosclerosis and coronary artery calcification    07/29/2016 Imaging    MRI t-spine: 1. Stable benign slightly enhancing sclerotic lesion in the right posterolateral aspect of the T11 vertebral body, unchanged for 5 years. This is felt to account for the subtle increased activity at T11 on the SPECT bone scan. 2. Slight degenerative disc disease with small disc protrusions at T1-2 and T2-3 and T12-L1 with no neural impingement. 3. No evidence of metastatic disease.    09/23/2016 Imaging    CT chest- Radiation changes in the central right upper lobe/ perihilar region.  Stable small mediastinal lymph nodes, unchanged.  No findings suspicious for new/progressive metastatic disease.  12/25/2016 Imaging    CT C/A/P: IMPRESSION: 1. Continued further decrease in mediastinal lymph nodes. 2. Index nodule antero medial right upper lobe has decreased. The new 5 mm right lower lobe subpleural nodule seen on the previous study has resolved in the interval. Ground-glass nodules in the upper  lobes bilaterally are stable. 3. Interval development of interstitial and alveolar opacity in the parahilar right lung is presumably radiation related. Attention on follow-up recommended. 4. New tiny right pleural effusion with areas of apparent pleural enhancement in the posterior right costophrenic sulcus. Close attention on follow-up recommended. 5. Marked abdominal aortic atherosclerosis with likely infrarenal significant stenosis.     07/02/2017 Imaging    CT Chest w/ contrast: Resolution of central right lower lobe infiltrate and right pleural effusion since prior study.  Stable ground-glass opacities in both upper lobes.  No new or progressive disease    11/16/2017 -  Chemotherapy    The patient had palonosetron (ALOXI) injection 0.25 mg, 0.25 mg, Intravenous,  Once, 5 of 5 cycles Administration: 0.25 mg (11/17/2017), 0.25 mg (11/19/2017), 0.25 mg (11/21/2017), 0.25 mg (01/12/2018), 0.25 mg (01/14/2018), 0.25 mg (01/16/2018), 0.25 mg (12/16/2017), 0.25 mg (12/18/2017), 0.25 mg (02/16/2018), 0.25 mg (02/18/2018), 0.25 mg (02/20/2018), 0.25 mg (03/24/2018), 0.25 mg (03/26/2018) azaCITIDine (VIDAZA) 130 mg in sodium chloride 0.9 % 50 mL chemo infusion, 75 mg/m2 = 130 mg, Intravenous, Once, 5 of 5 cycles Dose modification: 67.5 mg/m2 (90 % of original dose 75 mg/m2, Cycle 4, Reason: Provider Judgment, Comment: prolonged cytopenias) Administration: 130 mg (11/17/2017), 130 mg (11/18/2017), 130 mg (11/19/2017), 130 mg (11/20/2017), 130 mg (11/21/2017), 130 mg (12/16/2017), 130 mg (12/17/2017), 130 mg (12/18/2017), 130 mg (12/19/2017), 130 mg (01/12/2018), 130 mg (01/13/2018), 130 mg (01/14/2018), 130 mg (01/15/2018), 130 mg (01/16/2018), 115 mg (02/16/2018), 115 mg (02/17/2018), 115 mg (02/18/2018), 115 mg (02/19/2018), 115 mg (02/20/2018), 130 mg (03/24/2018), 130 mg (03/25/2018), 130 mg (03/26/2018), 130 mg (03/27/2018)  for chemotherapy treatment.      MDS (myelodysplastic syndrome), high grade (HCC)   10/20/2017 Initial Diagnosis      MDS (myelodysplastic syndrome), high grade ([place])    11/16/2017 -  Chemotherapy    The patient had palonosetron (ALOXI) injection 0.25 mg, 0.25 mg, Intravenous,  Once, 5 of 5 cycles Administration: 0.25 mg (11/17/2017), 0.25 mg (11/19/2017), 0.25 mg (11/21/2017), 0.25 mg (01/12/2018), 0.25 mg (01/14/2018), 0.25 mg (01/16/2018), 0.25 mg (12/16/2017), 0.25 mg (12/18/2017), 0.25 mg (02/16/2018), 0.25 mg (02/18/2018), 0.25 mg (02/20/2018), 0.25 mg (03/24/2018), 0.25 mg (03/26/2018) azaCITIDine (VIDAZA) 130 mg in sodium chloride 0.9 % 50 mL chemo infusion, 75 mg/m2 = 130 mg, Intravenous, Once, 5 of 5 cycles Dose modification: 67.5 mg/m2 (90 % of original dose 75 mg/m2, Cycle 4, Reason: Provider Judgment, Comment: prolonged cytopenias) Administration: 130 mg (11/17/2017), 130 mg (11/18/2017), 130 mg (11/19/2017), 130 mg (11/20/2017), 130 mg (11/21/2017), 130 mg (12/16/2017), 130 mg (12/17/2017), 130 mg (12/18/2017), 130 mg (12/19/2017), 130 mg (01/12/2018), 130 mg (01/13/2018), 130 mg (01/14/2018), 130 mg (01/15/2018), 130 mg (01/16/2018), 115 mg (02/16/2018), 115 mg (02/17/2018), 115 mg (02/18/2018), 115 mg (02/19/2018), 115 mg (02/20/2018), 130 mg (03/24/2018), 130 mg (03/25/2018), 130 mg (03/26/2018), 130 mg (03/27/2018)  for chemotherapy treatment.      INTERVAL HISTORY:  Ms. Klepper 82 y.o. female returns for routine follow-up for Myelodysplastic syndrome. Patient is here with her daughter today. Patient is very weak and pale. She has been fatigued over the past week. She went out of town to [place] for a couple days and she is  more fatigued from her trip. She has bruising up and down both arms. She has not fallen. She has been having black stools for the past weekend. She has occasional diarrhea due to the boost. She is drinking 4 cans of boost daily. She has lost 1 pound but overall doing well maintaining her weight. Her appetite is 75%. Her energy level is 50%. She denies any new pains. Denies any nausea or vomiting. Denies any SOB.    REVIEW OF  SYSTEMS:  Review of Systems  Constitutional: Positive for fatigue.  Cardiovascular: Positive for leg swelling.  Gastrointestinal: Positive for diarrhea.  Neurological: Positive for dizziness and extremity weakness.  Hematological: Bruises/bleeds easily.  All other systems reviewed and are negative.    PAST MEDICAL/SURGICAL HISTORY:  Past Medical History:  Diagnosis Date  . Allergy   . Cancer ([place])    lung  . Hyperlipidemia   . Iron deficiency anemia 12/15/2015  . Low vitamin B12 level 12/15/2015  . Osteopenia    Past Surgical History:  Procedure Laterality Date  . CATARACT EXTRACTION, BILATERAL Bilateral   . COLONOSCOPY N/A 12/06/2016   Procedure: COLONOSCOPY;  Surgeon: Danie Binder, MD;  [facility];  Service: Endoscopy;  Laterality: N/A;  1:45pm  . ESOPHAGOGASTRODUODENOSCOPY N/A 12/06/2016   Procedure: ESOPHAGOGASTRODUODENOSCOPY (EGD);  Surgeon: Danie Binder, MD;  Location: [facility];  Service: Endoscopy;  Laterality: N/A;  . MINOR EXCISION EAR CANAL CYST Left    30+ years ago  . SMALL INTESTINE SURGERY    . VIDEO BRONCHOSCOPY WITH ENDOBRONCHIAL ULTRASOUND N/A 12/11/2015   Procedure: VIDEO BRONCHOSCOPY WITH ENDOBRONCHIAL ULTRASOUND;  Surgeon: Melrose Nakayama, MD;  Location: [facility];  Service: Thoracic;  Laterality: N/A;     SOCIAL HISTORY:  Social History   Socioeconomic History  . Marital status: Married    Spouse name: Not on file  . Number of children: Not on file  . Years of education: Not on file  . Highest education level: Not on file  Occupational History  . Not on file  Social Needs  . Financial resource strain: Not on file  . Food insecurity:    Worry: Not on file    Inability: Not on file  . Transportation needs:    Medical: Not on file    Non-medical: Not on file  Tobacco Use  . Smoking status: Former Smoker    Packs/day: 0.25    Years: 50.00    Pack years: 12.50    Last attempt to quit: 01/08/2016    Years since quitting:  2.2  . Smokeless tobacco: Never Used  Substance and Sexual Activity  . Alcohol use: No  . Drug use: No  . Sexual activity: Not Currently  Lifestyle  . Physical activity:    Days per week: Not on file    Minutes per session: Not on file  . Stress: Not on file  Relationships  . Social connections:    Talks on phone: Not on file    Gets together: Not on file    Attends religious service: Not on file    Active member of club or organization: Not on file    Attends meetings of clubs or organizations: Not on file    Relationship status: Not on file  . Intimate partner violence:    Fear of current or ex partner: Not on file    Emotionally abused: Not on file    Physically abused: Not on file    Forced sexual  activity: Not on file  Other Topics Concern  . Not on file  Social History Narrative  . Not on file    FAMILY HISTORY:  Family History  Problem Relation Age of Onset  . Cancer Mother   . Hypertension Father   . Colon cancer Neg Hx     CURRENT MEDICATIONS:  Outpatient Encounter Medications as of 04/20/2018  Medication Sig  . ANORO ELLIPTA 62.5-25 MCG/INH AEPB INHALE 1 PUFF BY MOUTH INTO LUNGS DAILY  . cyanocobalamin (,VITAMIN B-12,) 1000 MCG/ML injection Inject 1 mL (1,000 mcg total) into the muscle every 30 (thirty) days.  Marland Kitchen docusate sodium (COLACE) 100 MG capsule Take 100 mg by mouth daily.  Marland Kitchen donepezil (ARICEPT) 10 MG tablet Take 1 tablet (10 mg total) by mouth at bedtime.  . dronabinol (MARINOL) 2.5 MG capsule Take 1 capsule (2.5 mg total) by mouth 2 (two) times daily before a meal.  . memantine (NAMENDA) 5 MG tablet Take 1 tablet (5 mg total) by mouth 2 (two) times daily.  . ondansetron (ZOFRAN) 8 MG tablet Take 1 tablet (8 mg total) by mouth 2 (two) times daily. Start on the third day from the last day of chemotherapy  . prochlorperazine (COMPAZINE) 10 MG tablet Take 1 tablet (10 mg total) by mouth every 6 (six) hours as needed for nausea or vomiting.    Facility-Administered Encounter Medications as of 04/20/2018  Medication  . sodium chloride flush (NS) 0.9 % injection 10 mL    ALLERGIES:  Allergies  Allergen Reactions  . Flagyl [Metronidazole Hcl] Nausea Only  . Penicillins Other (See Comments)    Has patient had a PCN reaction causing immediate rash, facial/tongue/throat swelling, SOB or lightheadedness with hypotension: unknown Has patient had a PCN reaction causing severe rash involving mucus membranes or skin necrosis: unknown Has patient had a PCN reaction that required hospitalization unknown Has patient had a PCN reaction occurring within the last 10 years: unknown  If all of the above answers are "NO", then may proceed with Cephalosporin use.     PHYSICAL EXAM:  ECOG Performance status: 1  Vitals:   04/20/18 1032  BP: (!) 106/54  Pulse: 96  Resp: 17  Temp: (!) 96.8 F (36 C)  SpO2: 100%   Filed Weights   04/20/18 1032  Weight: 137 lb 3.2 oz (62.2 kg)    Physical Exam  Constitutional: She is oriented to person, place, and time. She appears well-developed and well-nourished.  Cardiovascular: Normal rate, regular rhythm and normal heart sounds.  Pulmonary/Chest: Effort normal and breath sounds normal.  Musculoskeletal: Normal range of motion.  Neurological: She is alert and oriented to person, place, and time.  Skin: There is pallor.  bruising bilateral arms  Psychiatric: She has a normal mood and affect. Her behavior is normal. Judgment and thought content normal.     LABORATORY DATA:  I have reviewed the labs as listed.  CBC    Component Value Date/Time   WBC 1.7 (L) 04/20/2018 0930   RBC 1.83 (L) 04/20/2018 0930   HGB 5.9 (LL) 04/20/2018 0930   HCT 17.3 (L) 04/20/2018 0930   PLT 44 (L) 04/20/2018 0930   MCV 94.5 04/20/2018 0930   MCV 71.9 (A) 08/20/2013 1559   MCH 32.2 04/20/2018 0930   MCHC 34.1 04/20/2018 0930   RDW 14.3 04/20/2018 0930   LYMPHSABS 0.4 (L) 04/20/2018 0930   MONOABS 0.1  04/20/2018 0930   EOSABS 0.0 04/20/2018 0930   BASOSABS 0.0 04/20/2018 0930  CMP Latest Ref Rng & Units 04/20/2018 03/24/2018 03/16/2018  Glucose 70 - 99 mg/dL 135(H) 98 126(H)  BUN 8 - 23 mg/dL 29(H) 27(H) 27(H)  Creatinine 0.44 - 1.00 mg/dL 1.42(H) 1.24(H) 1.21(H)  Sodium 135 - 145 mmol/L 141 142 142  Potassium 3.5 - 5.1 mmol/L 4.0 4.1 4.0  Chloride 98 - 111 mmol/L 109 108 106  CO2 22 - 32 mmol/L 26 29 28   Calcium 8.9 - 10.3 mg/dL 8.7(L) 8.7(L) 8.8(L)  Total Protein 6.5 - 8.1 g/dL 6.3(L) 6.5 6.5  Total Bilirubin 0.3 - 1.2 mg/dL 0.9 0.6 0.7  Alkaline Phos 38 - 126 U/L 68 74 70  AST 15 - 41 U/L 24 18 22   ALT 0 - 44 U/L 19 13 17           ASSESSMENT & PLAN:   MDS (myelodysplastic syndrome), high grade (HCC) 1. 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  Megakaryocytes are increased with frequent small, hyperlobulated and hyperchromatic forms.  Granulocytic precursors have no significant dysplasia. 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 -She received a total of 5 points based on revised IPSS system, with a median survival in the absence of therapy of 1.6 years, 25% AML progression in the absence of therapy and 1.4 years.  She has received chemotherapy with carboplatin and etoposide in 2017.  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  She has been receiving Aranesp for more than a year, dose increased to 200 mcg every 2 weeks in December 2019.  We have given Aranesp at last visit a week ago.  She completely lost response to erythropoiesis stimulating agents. - Azacitidine 75 mg/m x 5 days every 28 days cycle 1 started on 11/17/2017 - Received 1 unit of blood transfusion  for hemoglobin of 7 on 11/17/2017, 2 units of blood transfusion on 12/03/2017, on cycle 1 day 14. - Cycle 3 of azacitidine on 01/12/2018. - Cycle 4 was on 02/16/2018. Cycle 5 was on 03/24/2018. - Today her hemoglobin is 5.9.  She is severely weak.  She will receive 2 units of blood transfusion.  She reports having black stools over the weekend.  We will check her stool for occult blood.  We will plan to delay her next cycle by 1 week.  We will check her blood counts next Monday to initiate her next cycle.  She will receive high-dose flu vaccine today.  2.  Weight loss: She has used Marinol in the past without much help.  Her appetite usually picks up after 2 weeks of each chemo.  She is drinking 4 cans of boost per day.  3.  Small cell lung cancer: She completed chemoradiation therapy around July 2017.  Last CT of the chest was done in December 2018.  We will order CT scan of her chest with contrast for follow-up.      Orders placed this encounter:  Orders Placed This Encounter  Procedures  . CT Chest W Contrast  . CBC with Differential/Platelet  . Comprehensive metabolic panel  . Lactate dehydrogenase  . CBC with Differential/Platelet  . Comprehensive metabolic panel  . Lactate dehydrogenase  . CBC with Differential/Platelet  . Comprehensive metabolic panel  . Lactate dehydrogenase      Derek Jack, MD [facility]  [phone]  

## 2018-04-20 NOTE — Telephone Encounter (Signed)
Aware Rf sent to [facility]

## 2018-04-21 ENCOUNTER — Other Ambulatory Visit (HOSPITAL_COMMUNITY): Payer: Self-pay | Admitting: *Deleted

## 2018-04-21 ENCOUNTER — Inpatient Hospital Stay (HOSPITAL_COMMUNITY): Payer: Medicare Other | Attending: Internal Medicine

## 2018-04-21 ENCOUNTER — Ambulatory Visit (HOSPITAL_COMMUNITY): Payer: Medicare Other

## 2018-04-21 ENCOUNTER — Other Ambulatory Visit (HOSPITAL_COMMUNITY): Payer: Self-pay | Admitting: Nurse Practitioner

## 2018-04-21 DIAGNOSIS — D46Z Other myelodysplastic syndromes: Secondary | ICD-10-CM | POA: Insufficient documentation

## 2018-04-21 DIAGNOSIS — Z5111 Encounter for antineoplastic chemotherapy: Secondary | ICD-10-CM | POA: Insufficient documentation

## 2018-04-21 DIAGNOSIS — R195 Other fecal abnormalities: Secondary | ICD-10-CM

## 2018-04-21 LAB — OCCULT BLOOD X 1 CARD TO LAB, STOOL: Fecal Occult Bld: POSITIVE — AB

## 2018-04-21 LAB — PREPARE RBC (CROSSMATCH)

## 2018-04-21 MED ORDER — DIPHENHYDRAMINE HCL 25 MG PO CAPS
ORAL_CAPSULE | ORAL | Status: AC
  Filled 2018-04-21: qty 1

## 2018-04-21 MED ORDER — SODIUM CHLORIDE 0.9% IV SOLUTION
250.0000 mL | Freq: Once | INTRAVENOUS | Status: AC
  Administered 2018-04-21: 250 mL via INTRAVENOUS

## 2018-04-21 MED ORDER — ACETAMINOPHEN 325 MG PO TABS
650.0000 mg | ORAL_TABLET | Freq: Once | ORAL | Status: AC
  Administered 2018-04-21: 650 mg via ORAL

## 2018-04-21 MED ORDER — HEPARIN SOD (PORK) LOCK FLUSH 100 UNIT/ML IV SOLN
500.0000 [IU] | Freq: Every day | INTRAVENOUS | Status: AC | PRN
  Administered 2018-04-21: 500 [IU]

## 2018-04-21 MED ORDER — DIPHENHYDRAMINE HCL 25 MG PO CAPS
25.0000 mg | ORAL_CAPSULE | Freq: Once | ORAL | Status: AC
  Administered 2018-04-21: 25 mg via ORAL

## 2018-04-21 MED ORDER — ACETAMINOPHEN 325 MG PO TABS
ORAL_TABLET | ORAL | Status: AC
  Filled 2018-04-21: qty 2

## 2018-04-21 MED ORDER — SODIUM CHLORIDE 0.9% FLUSH
10.0000 mL | INTRAVENOUS | Status: AC | PRN
  Administered 2018-04-21: 10 mL

## 2018-04-21 NOTE — Patient Instructions (Signed)
[place] at [facility] Discharge Instructions  One unit of blood given today Follow up as scheduled   Thank you for choosing [place] at [facility] to provide your oncology and hematology care.  To afford each patient quality time with our provider, please arrive at least 15 minutes before your scheduled appointment time.   If you have a lab appointment with the [facility] please come in thru the  Main Entrance and check in at the main information desk  You need to re-schedule your appointment should you arrive 10 or more minutes late.  We strive to give you quality time with our providers, and arriving late affects you and other patients whose appointments are after yours.  Also, if you no show three or more times for appointments you may be dismissed from the clinic at the providers discretion.     Again, thank you for choosing [facility].  Our hope is that these requests will decrease the amount of time that you wait before being seen by our physicians.       _____________________________________________________________  Should you have questions after your visit to [facility], please contact our office at [phone] between the hours of 8:00 a.m. and 4:30 p.m.  Voicemails left after 4:00 p.m. will not be returned until the following business day.  For prescription refill requests, have your pharmacy contact our office and allow 72 hours.    Cancer Center Support Programs:   > Cancer Support Group  2nd Tuesday of the month 1pm-2pm, Journey Room

## 2018-04-21 NOTE — Progress Notes (Signed)
One unit of blood given today per orders.   Patient tolerated it well without problems. Vitals stable and discharged home from clinic via ambulatory with walker and daughter at her side.  Follow up as scheduled.

## 2018-04-22 ENCOUNTER — Ambulatory Visit (HOSPITAL_COMMUNITY): Payer: Medicare Other

## 2018-04-22 LAB — TYPE AND SCREEN
ABO/RH(D): A POS
Antibody Screen: NEGATIVE
UNIT DIVISION: 0
UNIT DIVISION: 0
UNIT DIVISION: 0

## 2018-04-22 LAB — BPAM RBC
BLOOD PRODUCT EXPIRATION DATE: 201911012359
BLOOD PRODUCT EXPIRATION DATE: 201911022359
BLOOD PRODUCT EXPIRATION DATE: 201911022359
ISSUE DATE / TIME: 201910011458
ISSUE DATE / TIME: 201909301412
ISSUE DATE / TIME: 201910011446
UNIT TYPE AND RH: 6200
UNIT TYPE AND RH: 6200
Unit Type and Rh: 6200

## 2018-04-23 ENCOUNTER — Ambulatory Visit (HOSPITAL_COMMUNITY): Payer: Medicare Other

## 2018-04-24 ENCOUNTER — Other Ambulatory Visit (HOSPITAL_COMMUNITY): Payer: Self-pay | Admitting: Nurse Practitioner

## 2018-04-24 ENCOUNTER — Ambulatory Visit (HOSPITAL_COMMUNITY): Payer: Medicare Other

## 2018-04-27 ENCOUNTER — Other Ambulatory Visit (HOSPITAL_COMMUNITY): Payer: Self-pay | Admitting: *Deleted

## 2018-04-27 ENCOUNTER — Inpatient Hospital Stay (HOSPITAL_COMMUNITY): Payer: Medicare Other

## 2018-04-27 VITALS — BP 98/49 | HR 77 | Temp 97.7°F | Resp 16 | Wt 136.6 lb

## 2018-04-27 DIAGNOSIS — D46Z Other myelodysplastic syndromes: Secondary | ICD-10-CM

## 2018-04-27 DIAGNOSIS — C3491 Malignant neoplasm of unspecified part of right bronchus or lung: Secondary | ICD-10-CM

## 2018-04-27 DIAGNOSIS — Z9289 Personal history of other medical treatment: Secondary | ICD-10-CM

## 2018-04-27 DIAGNOSIS — Z5111 Encounter for antineoplastic chemotherapy: Secondary | ICD-10-CM | POA: Diagnosis not present

## 2018-04-27 DIAGNOSIS — D649 Anemia, unspecified: Secondary | ICD-10-CM

## 2018-04-27 DIAGNOSIS — D469 Myelodysplastic syndrome, unspecified: Secondary | ICD-10-CM

## 2018-04-27 LAB — CBC WITH DIFFERENTIAL/PLATELET
BASOS ABS: 0 10*3/uL (ref 0.0–0.1)
BASOS PCT: 0 %
EOS ABS: 0 10*3/uL (ref 0.0–0.7)
EOS PCT: 2 %
HCT: 25.6 % — ABNORMAL LOW (ref 36.0–46.0)
HEMOGLOBIN: 8.5 g/dL — AB (ref 12.0–15.0)
Lymphocytes Relative: 29 %
Lymphs Abs: 0.6 10*3/uL — ABNORMAL LOW (ref 0.7–4.0)
MCH: 30.5 pg (ref 26.0–34.0)
MCHC: 33.2 g/dL (ref 30.0–36.0)
MCV: 91.8 fL (ref 78.0–100.0)
Monocytes Absolute: 0.2 10*3/uL (ref 0.1–1.0)
Monocytes Relative: 10 %
NEUTROS PCT: 59 %
Neutro Abs: 1.2 10*3/uL — ABNORMAL LOW (ref 1.7–7.7)
PLATELETS: 44 10*3/uL — AB (ref 150–400)
RBC: 2.79 MIL/uL — AB (ref 3.87–5.11)
RDW: 14 % (ref 11.5–15.5)
WBC: 1.9 10*3/uL — AB (ref 4.0–10.5)

## 2018-04-27 LAB — COMPREHENSIVE METABOLIC PANEL
ALBUMIN: 3.5 g/dL (ref 3.5–5.0)
ALK PHOS: 65 U/L (ref 38–126)
ALT: 21 U/L (ref 0–44)
AST: 26 U/L (ref 15–41)
Anion gap: 7 (ref 5–15)
BUN: 27 mg/dL — ABNORMAL HIGH (ref 8–23)
CALCIUM: 8.8 mg/dL — AB (ref 8.9–10.3)
CHLORIDE: 107 mmol/L (ref 98–111)
CO2: 25 mmol/L (ref 22–32)
CREATININE: 1.18 mg/dL — AB (ref 0.44–1.00)
GFR calc Af Amer: 49 mL/min — ABNORMAL LOW (ref >60)
GFR calc non Af Amer: 42 mL/min — ABNORMAL LOW (ref >60)
GLUCOSE: 94 mg/dL (ref 70–99)
Potassium: 4.2 mmol/L (ref 3.5–5.1)
SODIUM: 139 mmol/L (ref 135–145)
Total Bilirubin: 0.7 mg/dL (ref 0.3–1.2)
Total Protein: 6.5 g/dL (ref 6.5–8.1)

## 2018-04-27 LAB — SAMPLE TO BLOOD BANK

## 2018-04-27 MED ORDER — SODIUM CHLORIDE 0.9 % IV SOLN
Freq: Once | INTRAVENOUS | Status: AC
  Administered 2018-04-27: 14:00:00 via INTRAVENOUS

## 2018-04-27 MED ORDER — SODIUM CHLORIDE 0.9 % IV SOLN
67.5000 mg/m2 | Freq: Once | INTRAVENOUS | Status: AC
  Administered 2018-04-27: 115 mg via INTRAVENOUS
  Filled 2018-04-27: qty 11.5

## 2018-04-27 MED ORDER — PALONOSETRON HCL INJECTION 0.25 MG/5ML
0.2500 mg | Freq: Once | INTRAVENOUS | Status: AC
  Administered 2018-04-27: 0.25 mg via INTRAVENOUS
  Filled 2018-04-27: qty 5

## 2018-04-27 MED ORDER — SODIUM CHLORIDE 0.9 % IV SOLN
10.0000 mg | Freq: Once | INTRAVENOUS | Status: AC
  Administered 2018-04-27: 10 mg via INTRAVENOUS
  Filled 2018-04-27: qty 1

## 2018-04-27 MED ORDER — SODIUM CHLORIDE 0.9% FLUSH
10.0000 mL | INTRAVENOUS | Status: DC | PRN
  Administered 2018-04-27: 10 mL
  Filled 2018-04-27: qty 10

## 2018-04-27 NOTE — Progress Notes (Signed)
1400 labs reviewed with Dr. Delton Coombes including Platelets 44, and ANC 1.2. Per MD, ok to proceed with treatment this week.  Lilli Light Franqui tolerated Vidaza without incident or complaint. VSS upon completion of treatment. Port flushed and left accessed for use tomorrow. Discharged using rolling walker in presence of daughter.

## 2018-04-27 NOTE — Patient Instructions (Signed)
[facility] Discharge Instructions for Patients Receiving Chemotherapy   Beginning January 23rd 2017 lab work for the [facility] will be done in the  Main lab at [facility] on 1st floor. If you have a lab appointment with the [facility] please come in thru the  Main Entrance and check in at the main information desk   Today you received the following chemotherapy agents Vidaza. Follow up as scheduled. Call clinic for any questions or concerns.   To help prevent nausea and vomiting after your treatment, we encourage you to take your nausea medication   If you develop nausea and vomiting, or diarrhea that is not controlled by your medication, call the clinic.  The clinic phone number is [phone]. Office hours are Monday-Friday 8:30am-5:00pm.  BELOW ARE SYMPTOMS THAT SHOULD BE REPORTED IMMEDIATELY:  *FEVER GREATER THAN 101.0 F  *CHILLS WITH OR WITHOUT FEVER  NAUSEA AND VOMITING THAT IS NOT CONTROLLED WITH YOUR NAUSEA MEDICATION  *UNUSUAL SHORTNESS OF BREATH  *UNUSUAL BRUISING OR BLEEDING  TENDERNESS IN MOUTH AND THROAT WITH OR WITHOUT PRESENCE OF ULCERS  *URINARY PROBLEMS  *BOWEL PROBLEMS  UNUSUAL RASH Items with * indicate a potential emergency and should be followed up as soon as possible. If you have an emergency after office hours please contact your primary care physician or go to the nearest emergency department.  Please call the clinic during office hours if you have any questions or concerns.   You may also contact the Patient Navigator at [phone] should you have any questions or need assistance in obtaining follow up care.      Resources For Cancer Patients and their Caregivers ? American Cancer Society: Can assist with transportation, wigs, general needs, runs Look Good Feel Better.        ([phone] ? Cancer Care: Provides financial assistance, online support groups, medication/co-pay assistance.  1-800-813-HOPE  539-762-5635) ? [facility] Assists [place] cancer patients and their families through emotional , educational and financial support.  ([phone] 785-7202 ? [place] DSS Where to apply for food stamps, Medicaid and utility assistance. 330-615-5806 ? [facility]: Transportation to medical appointments. 828-456-0292 ? Social Security Administration: May apply for disability if have a Stage IV cancer. 930-632-0018 6156910234 ? [employer], Disability and Transit Services: Assists with nutrition, care and transit needs. 818-188-6696

## 2018-04-28 ENCOUNTER — Ambulatory Visit (HOSPITAL_COMMUNITY)
Admission: RE | Admit: 2018-04-28 | Discharge: 2018-04-28 | Disposition: A | Discharge status: Home / Self Care | Payer: Medicare Other | Source: Ambulatory Visit | Attending: Nurse Practitioner | Admitting: Nurse Practitioner

## 2018-04-28 ENCOUNTER — Inpatient Hospital Stay (HOSPITAL_COMMUNITY): Payer: Medicare Other

## 2018-04-28 VITALS — BP 103/46 | HR 80 | Temp 97.6°F | Resp 16

## 2018-04-28 DIAGNOSIS — J90 Pleural effusion, not elsewhere classified: Secondary | ICD-10-CM | POA: Insufficient documentation

## 2018-04-28 DIAGNOSIS — R918 Other nonspecific abnormal finding of lung field: Secondary | ICD-10-CM | POA: Diagnosis not present

## 2018-04-28 DIAGNOSIS — D46Z Other myelodysplastic syndromes: Secondary | ICD-10-CM

## 2018-04-28 DIAGNOSIS — I700 Atherosclerosis of aorta: Secondary | ICD-10-CM | POA: Diagnosis not present

## 2018-04-28 DIAGNOSIS — Y842 Radiological procedure and radiotherapy as the cause of abnormal reaction of the patient, or of later complication, without mention of misadventure at the time of the procedure: Secondary | ICD-10-CM | POA: Diagnosis not present

## 2018-04-28 DIAGNOSIS — J432 Centrilobular emphysema: Secondary | ICD-10-CM | POA: Insufficient documentation

## 2018-04-28 DIAGNOSIS — C3491 Malignant neoplasm of unspecified part of right bronchus or lung: Secondary | ICD-10-CM

## 2018-04-28 DIAGNOSIS — Z5111 Encounter for antineoplastic chemotherapy: Secondary | ICD-10-CM | POA: Diagnosis not present

## 2018-04-28 MED ORDER — SODIUM CHLORIDE 0.9% FLUSH
10.0000 mL | INTRAVENOUS | Status: DC | PRN
  Administered 2018-04-28: 10 mL
  Filled 2018-04-28: qty 10

## 2018-04-28 MED ORDER — IOHEXOL 300 MG/ML  SOLN
75.0000 mL | Freq: Once | INTRAMUSCULAR | Status: AC | PRN
  Administered 2018-04-28: 75 mL via INTRAVENOUS

## 2018-04-28 MED ORDER — SODIUM CHLORIDE 0.9 % IV SOLN
Freq: Once | INTRAVENOUS | Status: AC
  Administered 2018-04-28: 13:00:00 via INTRAVENOUS

## 2018-04-28 MED ORDER — SODIUM CHLORIDE 0.9 % IV SOLN
10.0000 mg | Freq: Once | INTRAVENOUS | Status: AC
  Administered 2018-04-28: 10 mg via INTRAVENOUS
  Filled 2018-04-28: qty 1

## 2018-04-28 MED ORDER — SODIUM CHLORIDE 0.9 % IV SOLN
67.5000 mg/m2 | Freq: Once | INTRAVENOUS | Status: AC
  Administered 2018-04-28: 115 mg via INTRAVENOUS
  Filled 2018-04-28: qty 11.5

## 2018-04-28 NOTE — Progress Notes (Signed)
Janet Fletcher tolerated Vidaza without incident or complaint. VSS upon completion of treatment. Port flushed and left accessed for use tomorrow. Pt discharged using rolling walker in satisfactory condition in presence of daughter.

## 2018-04-28 NOTE — Patient Instructions (Signed)
[facility] Discharge Instructions for Patients Receiving Chemotherapy   Beginning January 23rd 2017 lab work for the [facility] will be done in the  Main lab at [facility] on 1st floor. If you have a lab appointment with the [facility] please come in thru the  Main Entrance and check in at the main information desk   Today you received the following chemotherapy agents Vidaza. Follow up as scheduled. Call clinic for any questions or concerns.   To help prevent nausea and vomiting after your treatment, we encourage you to take your nausea medication   If you develop nausea and vomiting, or diarrhea that is not controlled by your medication, call the clinic.  The clinic phone number is [phone]. Office hours are Monday-Friday 8:30am-5:00pm.  BELOW ARE SYMPTOMS THAT SHOULD BE REPORTED IMMEDIATELY:  *FEVER GREATER THAN 101.0 F  *CHILLS WITH OR WITHOUT FEVER  NAUSEA AND VOMITING THAT IS NOT CONTROLLED WITH YOUR NAUSEA MEDICATION  *UNUSUAL SHORTNESS OF BREATH  *UNUSUAL BRUISING OR BLEEDING  TENDERNESS IN MOUTH AND THROAT WITH OR WITHOUT PRESENCE OF ULCERS  *URINARY PROBLEMS  *BOWEL PROBLEMS  UNUSUAL RASH Items with * indicate a potential emergency and should be followed up as soon as possible. If you have an emergency after office hours please contact your primary care physician or go to the nearest emergency department.  Please call the clinic during office hours if you have any questions or concerns.   You may also contact the Patient Navigator at [phone] should you have any questions or need assistance in obtaining follow up care.      Resources For Cancer Patients and their Caregivers ? American Cancer Society: Can assist with transportation, wigs, general needs, runs Look Good Feel Better.        ([phone] ? Cancer Care: Provides financial assistance, online support groups, medication/co-pay assistance.  1-800-813-HOPE  579-493-1179) ? [place] Assists [place] cancer patients and their families through emotional , educational and financial support.  878-244-9282 ? [place] DSS Where to apply for food stamps, Medicaid and utility assistance. (913) 709-4549 ? RCATS: Transportation to medical appointments. 406-114-5679 ? Social Security Administration: May apply for disability if have a Stage IV cancer. [phone] ? [employer], Disability and Transit Services: Assists with nutrition, care and transit needs. [phone]

## 2018-04-29 ENCOUNTER — Encounter (HOSPITAL_COMMUNITY): Payer: Self-pay

## 2018-04-29 ENCOUNTER — Inpatient Hospital Stay (HOSPITAL_COMMUNITY): Payer: Medicare Other

## 2018-04-29 VITALS — BP 107/57 | HR 88 | Temp 97.5°F | Resp 18

## 2018-04-29 DIAGNOSIS — C3491 Malignant neoplasm of unspecified part of right bronchus or lung: Secondary | ICD-10-CM

## 2018-04-29 DIAGNOSIS — D46Z Other myelodysplastic syndromes: Secondary | ICD-10-CM

## 2018-04-29 DIAGNOSIS — Z5111 Encounter for antineoplastic chemotherapy: Secondary | ICD-10-CM | POA: Diagnosis not present

## 2018-04-29 MED ORDER — SODIUM CHLORIDE 0.9 % IV SOLN
67.5000 mg/m2 | Freq: Once | INTRAVENOUS | Status: AC
  Administered 2018-04-29: 115 mg via INTRAVENOUS
  Filled 2018-04-29: qty 11.5

## 2018-04-29 MED ORDER — HEPARIN SOD (PORK) LOCK FLUSH 100 UNIT/ML IV SOLN
INTRAVENOUS | Status: AC
  Filled 2018-04-29: qty 5

## 2018-04-29 MED ORDER — SODIUM CHLORIDE 0.9 % IV SOLN
Freq: Once | INTRAVENOUS | Status: AC
  Administered 2018-04-29: 14:00:00 via INTRAVENOUS

## 2018-04-29 MED ORDER — SODIUM CHLORIDE 0.9 % IV SOLN
10.0000 mg | Freq: Once | INTRAVENOUS | Status: AC
  Administered 2018-04-29: 10 mg via INTRAVENOUS
  Filled 2018-04-29: qty 1

## 2018-04-29 MED ORDER — SODIUM CHLORIDE 0.9% FLUSH
10.0000 mL | INTRAVENOUS | Status: DC | PRN
  Administered 2018-04-29: 10 mL
  Filled 2018-04-29: qty 10

## 2018-04-29 MED ORDER — PALONOSETRON HCL INJECTION 0.25 MG/5ML
0.2500 mg | Freq: Once | INTRAVENOUS | Status: AC
  Administered 2018-04-29: 0.25 mg via INTRAVENOUS
  Filled 2018-04-29: qty 5

## 2018-04-29 NOTE — Patient Instructions (Signed)
[facility] Discharge Instructions for Patients Receiving Chemotherapy   Beginning January 23rd 2017 lab work for the [facility] will be done in the  Main lab at [facility] on 1st floor. If you have a lab appointment with the [facility] please come in thru the  Main Entrance and check in at the main information desk   Today you received the following chemotherapy agents Vidaza. Follow-up as scheduled. Call clinic for any questions or concerns  To help prevent nausea and vomiting after your treatment, we encourage you to take your nausea medication   If you develop nausea and vomiting, or diarrhea that is not controlled by your medication, call the clinic.  The clinic phone number is [phone]. Office hours are Monday-Friday 8:30am-5:00pm.  BELOW ARE SYMPTOMS THAT SHOULD BE REPORTED IMMEDIATELY:  *FEVER GREATER THAN 101.0 F  *CHILLS WITH OR WITHOUT FEVER  NAUSEA AND VOMITING THAT IS NOT CONTROLLED WITH YOUR NAUSEA MEDICATION  *UNUSUAL SHORTNESS OF BREATH  *UNUSUAL BRUISING OR BLEEDING  TENDERNESS IN MOUTH AND THROAT WITH OR WITHOUT PRESENCE OF ULCERS  *URINARY PROBLEMS  *BOWEL PROBLEMS  UNUSUAL RASH Items with * indicate a potential emergency and should be followed up as soon as possible. If you have an emergency after office hours please contact your primary care physician or go to the nearest emergency department.  Please call the clinic during office hours if you have any questions or concerns.   You may also contact the Patient Navigator at [phone] should you have any questions or need assistance in obtaining follow up care.      Resources For Cancer Patients and their Caregivers ? American Cancer Society: Can assist with transportation, wigs, general needs, runs Look Good Feel Better.        [phone] ? Cancer Care: Provides financial assistance, online support groups, medication/co-pay assistance.  1-800-813-HOPE  309-837-4936) ? [place] Assists [place] cancer patients and their families through emotional , educational and financial support.  2366015783 ? [place] DSS Where to apply for food stamps, Medicaid and utility assistance. [phone] ? RC[employer]: Transportation to medical appointments. 684-317-0408 ? Social Security Administration: May apply for disability if have a Stage IV cancer. [phone] 559-533-7762 ? [employer], Disability and Transit Services: Assists with nutrition, care and transit needs. [phone]

## 2018-04-29 NOTE — Progress Notes (Signed)
Lilli Light Prajapati tolerated Vidaza infusion well without complaints or incident. Port flushed and left accessed for use tomorrow. VSS upon discharge. Pt discharged self ambulatory using walker in satisfactory condition accompanied by family member

## 2018-04-30 ENCOUNTER — Inpatient Hospital Stay (HOSPITAL_COMMUNITY): Payer: Medicare Other

## 2018-04-30 ENCOUNTER — Encounter (HOSPITAL_COMMUNITY): Payer: Self-pay

## 2018-04-30 VITALS — BP 98/46 | HR 83 | Temp 97.8°F | Resp 18

## 2018-04-30 DIAGNOSIS — C3491 Malignant neoplasm of unspecified part of right bronchus or lung: Secondary | ICD-10-CM

## 2018-04-30 DIAGNOSIS — D46Z Other myelodysplastic syndromes: Secondary | ICD-10-CM

## 2018-04-30 DIAGNOSIS — Z5111 Encounter for antineoplastic chemotherapy: Secondary | ICD-10-CM | POA: Diagnosis not present

## 2018-04-30 MED ORDER — SODIUM CHLORIDE 0.9 % IV SOLN
Freq: Once | INTRAVENOUS | Status: AC
  Administered 2018-04-30: 14:00:00 via INTRAVENOUS

## 2018-04-30 MED ORDER — SODIUM CHLORIDE 0.9% FLUSH
10.0000 mL | INTRAVENOUS | Status: DC | PRN
  Administered 2018-04-30: 10 mL
  Filled 2018-04-30: qty 10

## 2018-04-30 MED ORDER — SODIUM CHLORIDE 0.9 % IV SOLN
67.5000 mg/m2 | Freq: Once | INTRAVENOUS | Status: AC
  Administered 2018-04-30: 115 mg via INTRAVENOUS
  Filled 2018-04-30: qty 11.5

## 2018-04-30 MED ORDER — SODIUM CHLORIDE 0.9 % IV SOLN
10.0000 mg | Freq: Once | INTRAVENOUS | Status: AC
  Administered 2018-04-30: 10 mg via INTRAVENOUS
  Filled 2018-04-30: qty 1

## 2018-04-30 NOTE — Progress Notes (Signed)
Janet Fletcher tolerated Vidaza infusion well without complaints or incident.Port left accessed for use tomorrow VSS upon discharge. Pt discharged self ambulatory using her walker in satisfactory condition accompanied by her daughter

## 2018-04-30 NOTE — Patient Instructions (Signed)
[facility] Discharge Instructions for Patients Receiving Chemotherapy   Beginning January 23rd 2017 lab work for the [facility] will be done in the  Main lab at [facility] on 1st floor. If you have a lab appointment with the [facility] please come in thru the  Main Entrance and check in at the main information desk   Today you received the following chemotherapy agents Vidaza. Follow-up as scheduled. Call clinic for any questions or concerns  To help prevent nausea and vomiting after your treatment, we encourage you to take your nausea medication   If you develop nausea and vomiting, or diarrhea that is not controlled by your medication, call the clinic.  The clinic phone number is [phone]. Office hours are Monday-Friday 8:30am-5:00pm.  BELOW ARE SYMPTOMS THAT SHOULD BE REPORTED IMMEDIATELY:  *FEVER GREATER THAN 101.0 F  *CHILLS WITH OR WITHOUT FEVER  NAUSEA AND VOMITING THAT IS NOT CONTROLLED WITH YOUR NAUSEA MEDICATION  *UNUSUAL SHORTNESS OF BREATH  *UNUSUAL BRUISING OR BLEEDING  TENDERNESS IN MOUTH AND THROAT WITH OR WITHOUT PRESENCE OF ULCERS  *URINARY PROBLEMS  *BOWEL PROBLEMS  UNUSUAL RASH Items with * indicate a potential emergency and should be followed up as soon as possible. If you have an emergency after office hours please contact your primary care physician or go to the nearest emergency department.  Please call the clinic during office hours if you have any questions or concerns.   You may also contact the Patient Navigator at [phone] should you have any questions or need assistance in obtaining follow up care.      Resources For Cancer Patients and their Caregivers ? American Cancer Society: Can assist with transportation, wigs, general needs, runs Look Good Feel Better.        442-029-2105 ? Cancer Care: Provides financial assistance, online support groups, medication/co-pay assistance.  1-800-813-HOPE  515-489-0611) ? [place] Assists [place] cancer patients and their families through emotional , educational and financial support.  506-453-1950 ? [place] DSS Where to apply for food stamps, Medicaid and utility assistance. [phone] ? RCATS: Transportation to medical appointments. 281-404-3535 ? Social Security Administration: May apply for disability if have a Stage IV cancer. [phone] ? [employer], Disability and Transit Services: Assists with nutrition, care and transit needs. [phone]

## 2018-05-01 ENCOUNTER — Inpatient Hospital Stay (HOSPITAL_COMMUNITY): Payer: Medicare Other

## 2018-05-01 ENCOUNTER — Other Ambulatory Visit: Payer: Self-pay

## 2018-05-01 VITALS — BP 106/53 | HR 81 | Temp 97.5°F | Resp 16 | Wt 141.0 lb

## 2018-05-01 DIAGNOSIS — C3491 Malignant neoplasm of unspecified part of right bronchus or lung: Secondary | ICD-10-CM

## 2018-05-01 DIAGNOSIS — D46Z Other myelodysplastic syndromes: Secondary | ICD-10-CM

## 2018-05-01 DIAGNOSIS — Z5111 Encounter for antineoplastic chemotherapy: Secondary | ICD-10-CM | POA: Diagnosis not present

## 2018-05-01 MED ORDER — SODIUM CHLORIDE 0.9% FLUSH
10.0000 mL | INTRAVENOUS | Status: DC | PRN
  Administered 2018-05-01: 10 mL
  Filled 2018-05-01: qty 10

## 2018-05-01 MED ORDER — PALONOSETRON HCL INJECTION 0.25 MG/5ML
0.2500 mg | Freq: Once | INTRAVENOUS | Status: AC
  Administered 2018-05-01: 0.25 mg via INTRAVENOUS

## 2018-05-01 MED ORDER — PALONOSETRON HCL INJECTION 0.25 MG/5ML
INTRAVENOUS | Status: AC
  Filled 2018-05-01: qty 5

## 2018-05-01 MED ORDER — SODIUM CHLORIDE 0.9 % IV SOLN
67.5000 mg/m2 | Freq: Once | INTRAVENOUS | Status: AC
  Administered 2018-05-01: 115 mg via INTRAVENOUS
  Filled 2018-05-01: qty 11.5

## 2018-05-01 MED ORDER — SODIUM CHLORIDE 0.9 % IV SOLN
10.0000 mg | Freq: Once | INTRAVENOUS | Status: AC
  Administered 2018-05-01: 10 mg via INTRAVENOUS
  Filled 2018-05-01: qty 1

## 2018-05-01 MED ORDER — SODIUM CHLORIDE 0.9 % IV SOLN
Freq: Once | INTRAVENOUS | Status: AC
  Administered 2018-05-01: 09:00:00 via INTRAVENOUS

## 2018-05-01 MED ORDER — HEPARIN SOD (PORK) LOCK FLUSH 100 UNIT/ML IV SOLN
INTRAVENOUS | Status: AC
  Filled 2018-05-01: qty 5

## 2018-05-01 MED ORDER — HEPARIN SOD (PORK) LOCK FLUSH 100 UNIT/ML IV SOLN
500.0000 [IU] | Freq: Once | INTRAVENOUS | Status: AC | PRN
  Administered 2018-05-01: 500 [IU]

## 2018-05-01 NOTE — Progress Notes (Signed)
Pt ambulated to room 403. No complaints at this time. VSS. Day 5 , cycle 8. Azacitidine treatment today.      Pt tolerated treatment well. Pt ambulated off unit to private vehicle. Assisted with walker. VSS. F/U as scheduled.

## 2018-05-01 NOTE — Patient Instructions (Signed)
[facility] Discharge Instructions for Patients Receiving Chemotherapy   Beginning January 23rd 2017 lab work for the [facility] will be done in the  Main lab at [facility] on 1st floor. If you have a lab appointment with the [facility] please come in thru the  Main Entrance and check in at the main information desk   Today you received the following chemotherapy   To help prevent nausea and vomiting after your treatment, we encourage you to take your nausea medication    If you develop nausea and vomiting, or diarrhea that is not controlled by your medication, call the clinic.  The clinic phone number is [phone]. Office hours are Monday-Friday 8:30am-5:00pm.  BELOW ARE SYMPTOMS THAT SHOULD BE REPORTED IMMEDIATELY:  *FEVER GREATER THAN 101.0 F  *CHILLS WITH OR WITHOUT FEVER  NAUSEA AND VOMITING THAT IS NOT CONTROLLED WITH YOUR NAUSEA MEDICATION  *UNUSUAL SHORTNESS OF BREATH  *UNUSUAL BRUISING OR BLEEDING  TENDERNESS IN MOUTH AND THROAT WITH OR WITHOUT PRESENCE OF ULCERS  *URINARY PROBLEMS  *BOWEL PROBLEMS  UNUSUAL RASH Items with * indicate a potential emergency and should be followed up as soon as possible. If you have an emergency after office hours please contact your primary care physician or go to the nearest emergency department.  Please call the clinic during office hours if you have any questions or concerns.   You may also contact the Patient Navigator at [phone] should you have any questions or need assistance in obtaining follow up care.      Resources For Cancer Patients and their Caregivers ? American Cancer Society: Can assist with transportation, wigs, general needs, runs Look Good Feel Better.        [phone] ? Cancer Care: Provides financial assistance, online support groups, medication/co-pay assistance.  1-800-813-HOPE 704-535-0632) ? [place] Assists [place] cancer patients and  their families through emotional , educational and financial support.  716-693-8893 ? [place] DSS Where to apply for food stamps, Medicaid and utility assistance. 207-510-7785 ? RCATS: Transportation to medical appointments. 8591693068 ? Social Security Administration: May apply for disability if have a Stage IV cancer. [phone] ? [employer], Disability and Transit Services: Assists with nutrition, care and transit needs. [phone]

## 2018-05-18 ENCOUNTER — Emergency Department (HOSPITAL_COMMUNITY): Payer: Medicare Other

## 2018-05-18 ENCOUNTER — Inpatient Hospital Stay (HOSPITAL_COMMUNITY)
Admission: EM | Admit: 2018-05-18 | Discharge: 2018-05-21 | DRG: 809 | Disposition: A | Discharge status: Home Health | Payer: Medicare Other | Attending: Internal Medicine | Admitting: Internal Medicine

## 2018-05-18 ENCOUNTER — Other Ambulatory Visit: Payer: Self-pay

## 2018-05-18 ENCOUNTER — Encounter (HOSPITAL_COMMUNITY): Payer: Self-pay | Admitting: Emergency Medicine

## 2018-05-18 DIAGNOSIS — N183 Chronic kidney disease, stage 3 (moderate): Secondary | ICD-10-CM | POA: Diagnosis present

## 2018-05-18 DIAGNOSIS — D62 Acute posthemorrhagic anemia: Secondary | ICD-10-CM | POA: Diagnosis present

## 2018-05-18 DIAGNOSIS — D508 Other iron deficiency anemias: Secondary | ICD-10-CM | POA: Diagnosis not present

## 2018-05-18 DIAGNOSIS — I959 Hypotension, unspecified: Secondary | ICD-10-CM

## 2018-05-18 DIAGNOSIS — D631 Anemia in chronic kidney disease: Secondary | ICD-10-CM | POA: Diagnosis present

## 2018-05-18 DIAGNOSIS — D61818 Other pancytopenia: Secondary | ICD-10-CM | POA: Diagnosis not present

## 2018-05-18 DIAGNOSIS — Z79899 Other long term (current) drug therapy: Secondary | ICD-10-CM

## 2018-05-18 DIAGNOSIS — E538 Deficiency of other specified B group vitamins: Secondary | ICD-10-CM | POA: Diagnosis present

## 2018-05-18 DIAGNOSIS — N189 Chronic kidney disease, unspecified: Secondary | ICD-10-CM

## 2018-05-18 DIAGNOSIS — C3490 Malignant neoplasm of unspecified part of unspecified bronchus or lung: Secondary | ICD-10-CM | POA: Diagnosis present

## 2018-05-18 DIAGNOSIS — Z888 Allergy status to other drugs, medicaments and biological substances status: Secondary | ICD-10-CM

## 2018-05-18 DIAGNOSIS — Z66 Do not resuscitate: Secondary | ICD-10-CM | POA: Diagnosis present

## 2018-05-18 DIAGNOSIS — Z515 Encounter for palliative care: Secondary | ICD-10-CM

## 2018-05-18 DIAGNOSIS — D509 Iron deficiency anemia, unspecified: Secondary | ICD-10-CM | POA: Diagnosis present

## 2018-05-18 DIAGNOSIS — Z880 Allergy status to penicillin: Secondary | ICD-10-CM

## 2018-05-18 DIAGNOSIS — K922 Gastrointestinal hemorrhage, unspecified: Secondary | ICD-10-CM | POA: Diagnosis present

## 2018-05-18 DIAGNOSIS — Z7951 Long term (current) use of inhaled steroids: Secondary | ICD-10-CM

## 2018-05-18 DIAGNOSIS — D469 Myelodysplastic syndrome, unspecified: Secondary | ICD-10-CM | POA: Diagnosis present

## 2018-05-18 LAB — COMPREHENSIVE METABOLIC PANEL
ALBUMIN: 3 g/dL — AB (ref 3.5–5.0)
ALK PHOS: 49 U/L (ref 38–126)
ALT: 32 U/L (ref 0–44)
AST: 42 U/L — ABNORMAL HIGH (ref 15–41)
Anion gap: 5 (ref 5–15)
BUN: 39 mg/dL — ABNORMAL HIGH (ref 8–23)
CALCIUM: 8.6 mg/dL — AB (ref 8.9–10.3)
CHLORIDE: 109 mmol/L (ref 98–111)
CO2: 26 mmol/L (ref 22–32)
CREATININE: 1.38 mg/dL — AB (ref 0.44–1.00)
GFR calc Af Amer: 41 mL/min — ABNORMAL LOW (ref >60)
GFR calc non Af Amer: 35 mL/min — ABNORMAL LOW (ref >60)
Glucose, Bld: 130 mg/dL — ABNORMAL HIGH (ref 70–99)
Potassium: 4.2 mmol/L (ref 3.5–5.1)
SODIUM: 140 mmol/L (ref 135–145)
Total Bilirubin: 1.1 mg/dL (ref 0.3–1.2)
Total Protein: 6 g/dL — ABNORMAL LOW (ref 6.5–8.1)

## 2018-05-18 LAB — CBC WITH DIFFERENTIAL/PLATELET
Abs Immature Granulocytes: 0.01 10*3/uL (ref 0.00–0.07)
Basophils Absolute: 0 10*3/uL (ref 0.0–0.1)
Basophils Relative: 0 %
Eosinophils Absolute: 0 10*3/uL (ref 0.0–0.5)
Eosinophils Relative: 0 %
HCT: 16.7 % — ABNORMAL LOW (ref 36.0–46.0)
HEMOGLOBIN: 5.2 g/dL — AB (ref 12.0–15.0)
IMMATURE GRANULOCYTES: 1 %
LYMPHS ABS: 0.2 10*3/uL — AB (ref 0.7–4.0)
LYMPHS PCT: 20 %
MCH: 29.1 pg (ref 26.0–34.0)
MCHC: 31.1 g/dL (ref 30.0–36.0)
MCV: 93.3 fL (ref 80.0–100.0)
MONO ABS: 0.2 10*3/uL (ref 0.1–1.0)
Monocytes Relative: 19 %
NEUTROS ABS: 0.6 10*3/uL — AB (ref 1.7–7.7)
Neutrophils Relative %: 60 %
Platelets: 15 10*3/uL — CL (ref 150–400)
RBC: 1.79 MIL/uL — ABNORMAL LOW (ref 3.87–5.11)
RDW: 14.4 % (ref 11.5–15.5)
WBC: 1 10*3/uL — CL (ref 4.0–10.5)
nRBC: 28.7 % — ABNORMAL HIGH (ref 0.0–0.2)

## 2018-05-18 LAB — URINALYSIS, ROUTINE W REFLEX MICROSCOPIC
BILIRUBIN URINE: NEGATIVE
GLUCOSE, UA: NEGATIVE mg/dL
Hgb urine dipstick: NEGATIVE
KETONES UR: NEGATIVE mg/dL
LEUKOCYTES UA: NEGATIVE
Nitrite: NEGATIVE
PH: 5 (ref 5.0–8.0)
Protein, ur: NEGATIVE mg/dL
SPECIFIC GRAVITY, URINE: 1.017 (ref 1.005–1.030)

## 2018-05-18 LAB — PREPARE RBC (CROSSMATCH)

## 2018-05-18 MED ORDER — THIAMINE HCL 100 MG/ML IJ SOLN
100.0000 mg | Freq: Once | INTRAMUSCULAR | Status: AC
  Administered 2018-05-18: 100 mg via INTRAVENOUS
  Filled 2018-05-18: qty 2

## 2018-05-18 MED ORDER — SODIUM CHLORIDE 0.9 % IV BOLUS
1000.0000 mL | Freq: Once | INTRAVENOUS | Status: AC
  Administered 2018-05-18: 1000 mL via INTRAVENOUS

## 2018-05-18 MED ORDER — SODIUM CHLORIDE 0.9% IV SOLUTION
Freq: Once | INTRAVENOUS | Status: AC
  Administered 2018-05-19: via INTRAVENOUS

## 2018-05-18 NOTE — ED Notes (Signed)
CRITICAL VALUE ALERT  Critical Value:  WBC 1.0 /Platelets 15  Date & Time Notied:  05/18/18 & 2334 hrs  Provider Notified: Dr. Lacinda Axon  Orders Received/Actions taken: N/A

## 2018-05-18 NOTE — ED Notes (Signed)
CRITICAL VALUE ALERT  Critical Value:  Hemoglobin 5.3  Date & Time Notied:  05/18/18 & 2307 hrs  Provider Notified: Dr. Lacinda Axon  Orders Received/Actions taken: N/A

## 2018-05-18 NOTE — ED Provider Notes (Signed)
[facility] EMERGENCY DEPARTMENT Provider Note   CSN: [id number] Arrival date & time: 05/18/18  2054     History   Chief Complaint Chief Complaint  Patient presents with  . Weakness    HPI Janet Fletcher is a 82 y.o. female.  Complains of generalized weakness progressively worsening over the past 1 week.  Other symptoms include mild cough.  No fever.  Her daughters report that she may need a transfusion.  She had similar episodes in the past with anemia requiring transfusion.  She is been too weak to walk over the past few days.  Is normally ambulatory with a walker.  No treatment prior to coming here.  Nothing makes symptoms better or worse.  Also had diminished appetite over the past several days.  Daughters report that stools have been dark  HPI  Past Medical History:  Diagnosis Date  . Allergy   . Cancer ([place])    lung  . Hyperlipidemia   . Iron deficiency anemia 12/15/2015  . Low vitamin B12 level 12/15/2015  . Osteopenia     Patient Active Problem List   Diagnosis Date Noted  . Fatigue 11/27/2017  . MDS (myelodysplastic syndrome), high grade ([place]) 10/20/2017  . Anemia in chronic kidney disease (CKD) 09/20/2017  . Mild memory disturbance 06/05/2017  . Helicobacter pylori gastritis 04/03/2017  . Tobacco abuse 12/15/2015  . Low vitamin B12 level 12/15/2015  . Iron deficiency anemia 12/15/2015  . Small cell carcinoma of right lung ([place]) 12/07/2015  . Combined forms of age-related cataract of right eye 01/19/2015  . Combined forms of age-related cataract of left eye 12/15/2014  . Hyperlipidemia with target LDL less than 100 09/29/2014  . GAD (generalized anxiety disorder) 09/29/2014    Past Surgical History:  Procedure Laterality Date  . CATARACT EXTRACTION, BILATERAL Bilateral   . COLONOSCOPY N/A 12/06/2016   Procedure: COLONOSCOPY;  Surgeon: Danie Binder, MD;  Location: [facility] ENDO [facility];  Service: Endoscopy;  Laterality: N/A;  1:45pm  .  ESOPHAGOGASTRODUODENOSCOPY N/A 12/06/2016   Procedure: ESOPHAGOGASTRODUODENOSCOPY (EGD);  Surgeon: Danie Binder, MD;  Location: [facility] ENDO SUITE;  Service: Endoscopy;  Laterality: N/A;  . MINOR EXCISION EAR CANAL CYST Left    30+ years ago  . SMALL INTESTINE SURGERY    . VIDEO BRONCHOSCOPY WITH ENDOBRONCHIAL ULTRASOUND N/A 12/11/2015   Procedure: VIDEO BRONCHOSCOPY WITH ENDOBRONCHIAL ULTRASOUND;  Surgeon: Melrose Nakayama, MD;  Location: [facility];  Service: Thoracic;  Laterality: N/A;     OB History   None      Home Medications    Prior to Admission medications   Medication Sig Start Date End Date Taking? Authorizing Provider  ANORO ELLIPTA 62.5-25 MCG/INH AEPB INHALE 1 PUFF BY MOUTH INTO LUNGS DAILY 04/07/18   [provider]  cyanocobalamin (,VITAMIN B-12,) 1000 MCG/ML injection Inject 1 mL (1,000 mcg total) into the muscle every 30 (thirty) days. 03/26/18   Derek Jack, MD  docusate sodium (COLACE) 100 MG capsule Take 100 mg by mouth daily.    [provider]  donepezil (ARICEPT) 10 MG tablet Take 1 tablet (10 mg total) by mouth at bedtime. 04/20/18   Hassell Done Mary-Margaret, FNP  dronabinol (MARINOL) 2.5 MG capsule Take 1 capsule (2.5 mg total) by mouth 2 (two) times daily before a meal. 12/16/17   Derek Jack, MD  memantine (NAMENDA) 5 MG tablet Take 1 tablet (5 mg total) by mouth 2 (two) times daily. 03/31/18   Hassell Done Mary-Margaret, FNP  ondansetron [facility]) 8  MG tablet Take 1 tablet (8 mg total) by mouth 2 (two) times daily. Start on the third day from the last day of chemotherapy 11/17/17   Derek Jack, MD  prochlorperazine (COMPAZINE) 10 MG tablet Take 1 tablet (10 mg total) by mouth every 6 (six) hours as needed for nausea or vomiting. 11/17/17   Derek Jack, MD    Family History Family History  Problem Relation Age of Onset  . Cancer Mother   . Hypertension Father   . Colon cancer Neg Hx     Social History Social History    Tobacco Use  . Smoking status: Former Smoker    Packs/day: 0.25    Years: 50.00    Pack years: 12.50    Last attempt to quit: 01/08/2016    Years since quitting: 2.3  . Smokeless tobacco: Never Used  Substance Use Topics  . Alcohol use: No  . Drug use: No     Allergies   Flagyl [metronidazole hcl] and Penicillins   Review of Systems Review of Systems  Constitutional: Positive for appetite change.  Respiratory: Positive for cough.   Allergic/Immunologic: Positive for immunocompromised state.  Neurological: Positive for weakness.       Generalized weakness  All other systems reviewed and are negative.    Physical Exam Updated Vital Signs BP (!) 92/42 (BP Location: Right Arm)   Pulse (!) 101   Temp 98.6 F (37 C) (Oral)   SpO2 93%   Physical Exam  Constitutional:  Chronically ill appearing and cachectic  HENT:  Head: Normocephalic and atraumatic.  mucus membranes dry pale  Eyes: Pupils are equal, round, and reactive to light.  conjuctivae pale  Neck: Neck supple. No tracheal deviation present. No thyromegaly present.  Cardiovascular: Normal rate and regular rhythm.  No murmur heard. Pulmonary/Chest: Effort normal and breath sounds normal.  Abdominal: Soft. Bowel sounds are normal. She exhibits no distension. There is no tenderness.  Genitourinary: Rectal exam shows guaiac positive stool.  Genitourinary Comments: Rectum normal tone brown stool trace Hemoccult positive  Musculoskeletal: Normal range of motion. She exhibits no edema or tenderness.  Neurological: Coordination normal.  Sleepy arousable to verbal stimulus  Skin: Skin is warm and dry. No rash noted. There is pallor.  Psychiatric: She has a normal mood and affect.  Nursing note and vitals reviewed.    ED Treatments / Results  Labs (all labs ordered are listed, but only abnormal results are displayed) Labs Reviewed  CBC WITH DIFFERENTIAL/PLATELET  COMPREHENSIVE METABOLIC PANEL  URINALYSIS,  ROUTINE W REFLEX MICROSCOPIC  POC OCCULT BLOOD, ED  POC OCCULT BLOOD, ED  TYPE AND SCREEN    EKG EKG Interpretation  Date/Time:  Monday May 18 2018 21:17:02 EDT Ventricular Rate:  89 PR Interval:    QRS Duration: 98 QT Interval:  392 QTC Calculation: 477 R Axis:   83 Text Interpretation:  Sinus rhythm Borderline right axis deviation Nonspecific T abnormalities, anterior leads No significant change since last tracing Confirmed by Orlie Dakin [phone]) on 05/18/2018 9:52:49 PM   Radiology No results found.  Procedures Procedures (including critical care time)  Medications Ordered in ED Medications  thiamine (B-1) injection 100 mg (has no administration in time range)  sodium chloride 0.9 % bolus 1,000 mL (has no administration in time range)     Initial Impression / Assessment and Plan / ED Course  I have reviewed the triage vital signs and the nursing notes.  Pertinent labs & imaging results that were available during  my care of the patient were reviewed by me and considered in my medical decision making (see chart for details).     Patient noted to be hypotensive.  IV ns bolus IV thiamine ordered.  Diagnostic tests pending pt signed out to Dr. Lacinda Axon 1030 pm   Final Clinical Impressions(s) / ED Diagnoses  Diagnoses #1 generalized weakness #2 hypotension Final diagnoses:  None    ED Discharge Orders    None       Orlie Dakin, MD 05/18/18 2235

## 2018-05-18 NOTE — ED Triage Notes (Signed)
Pt is a cancer pt and c/o weakness x 4 days, pt normally can ambulate with assistance but is unable to

## 2018-05-19 ENCOUNTER — Encounter (HOSPITAL_COMMUNITY): Payer: Self-pay | Admitting: *Deleted

## 2018-05-19 ENCOUNTER — Observation Stay (HOSPITAL_COMMUNITY): Payer: Medicare Other

## 2018-05-19 DIAGNOSIS — C3490 Malignant neoplasm of unspecified part of unspecified bronchus or lung: Secondary | ICD-10-CM | POA: Diagnosis present

## 2018-05-19 DIAGNOSIS — D61818 Other pancytopenia: Principal | ICD-10-CM

## 2018-05-19 DIAGNOSIS — D469 Myelodysplastic syndrome, unspecified: Secondary | ICD-10-CM | POA: Diagnosis present

## 2018-05-19 DIAGNOSIS — Z7951 Long term (current) use of inhaled steroids: Secondary | ICD-10-CM | POA: Diagnosis not present

## 2018-05-19 DIAGNOSIS — D500 Iron deficiency anemia secondary to blood loss (chronic): Secondary | ICD-10-CM | POA: Diagnosis not present

## 2018-05-19 DIAGNOSIS — Z880 Allergy status to penicillin: Secondary | ICD-10-CM | POA: Diagnosis not present

## 2018-05-19 DIAGNOSIS — E538 Deficiency of other specified B group vitamins: Secondary | ICD-10-CM | POA: Diagnosis present

## 2018-05-19 DIAGNOSIS — Z66 Do not resuscitate: Secondary | ICD-10-CM | POA: Diagnosis present

## 2018-05-19 DIAGNOSIS — I959 Hypotension, unspecified: Secondary | ICD-10-CM | POA: Diagnosis present

## 2018-05-19 DIAGNOSIS — D696 Thrombocytopenia, unspecified: Secondary | ICD-10-CM

## 2018-05-19 DIAGNOSIS — K922 Gastrointestinal hemorrhage, unspecified: Secondary | ICD-10-CM | POA: Diagnosis present

## 2018-05-19 DIAGNOSIS — D631 Anemia in chronic kidney disease: Secondary | ICD-10-CM | POA: Diagnosis present

## 2018-05-19 DIAGNOSIS — Z79899 Other long term (current) drug therapy: Secondary | ICD-10-CM | POA: Diagnosis not present

## 2018-05-19 DIAGNOSIS — N183 Chronic kidney disease, stage 3 (moderate): Secondary | ICD-10-CM | POA: Diagnosis present

## 2018-05-19 DIAGNOSIS — Z888 Allergy status to other drugs, medicaments and biological substances status: Secondary | ICD-10-CM | POA: Diagnosis not present

## 2018-05-19 DIAGNOSIS — D62 Acute posthemorrhagic anemia: Secondary | ICD-10-CM | POA: Diagnosis present

## 2018-05-19 DIAGNOSIS — D709 Neutropenia, unspecified: Secondary | ICD-10-CM

## 2018-05-19 DIAGNOSIS — D509 Iron deficiency anemia, unspecified: Secondary | ICD-10-CM | POA: Diagnosis present

## 2018-05-19 DIAGNOSIS — Z515 Encounter for palliative care: Secondary | ICD-10-CM | POA: Diagnosis not present

## 2018-05-19 LAB — COMPREHENSIVE METABOLIC PANEL
ALT: 31 U/L (ref 0–44)
ANION GAP: 5 (ref 5–15)
AST: 40 U/L (ref 15–41)
Albumin: 2.9 g/dL — ABNORMAL LOW (ref 3.5–5.0)
Alkaline Phosphatase: 53 U/L (ref 38–126)
BILIRUBIN TOTAL: 1.7 mg/dL — AB (ref 0.3–1.2)
BUN: 33 mg/dL — ABNORMAL HIGH (ref 8–23)
CO2: 24 mmol/L (ref 22–32)
Calcium: 8.4 mg/dL — ABNORMAL LOW (ref 8.9–10.3)
Chloride: 113 mmol/L — ABNORMAL HIGH (ref 98–111)
Creatinine, Ser: 1.23 mg/dL — ABNORMAL HIGH (ref 0.44–1.00)
GFR calc non Af Amer: 40 mL/min — ABNORMAL LOW (ref >60)
GFR, EST AFRICAN AMERICAN: 47 mL/min — AB (ref >60)
Glucose, Bld: 101 mg/dL — ABNORMAL HIGH (ref 70–99)
POTASSIUM: 4.1 mmol/L (ref 3.5–5.1)
SODIUM: 142 mmol/L (ref 135–145)
TOTAL PROTEIN: 5.7 g/dL — AB (ref 6.5–8.1)

## 2018-05-19 LAB — CBC
HCT: 20.5 % — ABNORMAL LOW (ref 36.0–46.0)
HEMOGLOBIN: 6.6 g/dL — AB (ref 12.0–15.0)
MCH: 28.7 pg (ref 26.0–34.0)
MCHC: 32.2 g/dL (ref 30.0–36.0)
MCV: 89.1 fL (ref 80.0–100.0)
NRBC: 16.1 % — AB (ref 0.0–0.2)
Platelets: 16 10*3/uL — CL (ref 150–400)
RBC: 2.3 MIL/uL — AB (ref 3.87–5.11)
RDW: 14.9 % (ref 11.5–15.5)
WBC: 1.4 10*3/uL — CL (ref 4.0–10.5)

## 2018-05-19 LAB — MRSA PCR SCREENING: MRSA BY PCR: NEGATIVE

## 2018-05-19 LAB — TROPONIN I

## 2018-05-19 LAB — LACTATE DEHYDROGENASE: LDH: 493 U/L — ABNORMAL HIGH (ref 98–192)

## 2018-05-19 LAB — SAVE SMEAR (SSMR)

## 2018-05-19 LAB — PREPARE RBC (CROSSMATCH)

## 2018-05-19 MED ORDER — ACETAMINOPHEN 650 MG RE SUPP: 650.0000 mg | Freq: Four times a day (QID) | RECTAL | Status: DC | PRN

## 2018-05-19 MED ORDER — SODIUM CHLORIDE 0.9% FLUSH
3.0000 mL | Freq: Two times a day (BID) | INTRAVENOUS | Status: DC
  Administered 2018-05-19 – 2018-05-21 (×5): 3 mL via INTRAVENOUS

## 2018-05-19 MED ORDER — DOCUSATE SODIUM 100 MG PO CAPS
100.0000 mg | ORAL_CAPSULE | Freq: Two times a day (BID) | ORAL | Status: DC
  Administered 2018-05-19 – 2018-05-21 (×6): 100 mg via ORAL
  Filled 2018-05-19 (×6): qty 1

## 2018-05-19 MED ORDER — MEMANTINE HCL 10 MG PO TABS
5.0000 mg | ORAL_TABLET | Freq: Two times a day (BID) | ORAL | Status: DC
  Administered 2018-05-19 – 2018-05-21 (×6): 5 mg via ORAL
  Filled 2018-05-19 (×6): qty 1

## 2018-05-19 MED ORDER — UMECLIDINIUM-VILANTEROL 62.5-25 MCG/INH IN AEPB
1.0000 | INHALATION_SPRAY | Freq: Every day | RESPIRATORY_TRACT | Status: DC
  Administered 2018-05-20 – 2018-05-21 (×2): 1 via RESPIRATORY_TRACT
  Filled 2018-05-19 (×4): qty 14

## 2018-05-19 MED ORDER — PROCHLORPERAZINE MALEATE 5 MG PO TABS: 10.0000 mg | ORAL_TABLET | Freq: Four times a day (QID) | ORAL | Status: DC | PRN

## 2018-05-19 MED ORDER — DONEPEZIL HCL 5 MG PO TABS
10.0000 mg | ORAL_TABLET | Freq: Every day | ORAL | Status: DC
  Administered 2018-05-19 – 2018-05-20 (×3): 10 mg via ORAL
  Filled 2018-05-19 (×3): qty 2

## 2018-05-19 MED ORDER — ACETAMINOPHEN 325 MG PO TABS: 650.0000 mg | ORAL_TABLET | Freq: Four times a day (QID) | ORAL | Status: DC | PRN

## 2018-05-19 MED ORDER — SODIUM CHLORIDE 0.9% FLUSH: 3.0000 mL | INTRAVENOUS | Status: DC | PRN

## 2018-05-19 MED ORDER — ALBUTEROL SULFATE (2.5 MG/3ML) 0.083% IN NEBU: 3.0000 mL | INHALATION_SOLUTION | Freq: Four times a day (QID) | RESPIRATORY_TRACT | Status: DC | PRN

## 2018-05-19 MED ORDER — SODIUM CHLORIDE 0.9% IV SOLUTION
Freq: Once | INTRAVENOUS | Status: AC
  Administered 2018-05-19: 19:00:00 via INTRAVENOUS

## 2018-05-19 MED ORDER — SODIUM CHLORIDE 0.9 % IV SOLN: 250.0000 mL | INTRAVENOUS | Status: DC | PRN

## 2018-05-19 MED ORDER — ENSURE ENLIVE PO LIQD
237.0000 mL | Freq: Two times a day (BID) | ORAL | Status: DC
  Administered 2018-05-19 – 2018-05-20 (×4): 237 mL via ORAL

## 2018-05-19 NOTE — Progress Notes (Signed)
No charge note.  I introduced myself to the patient and her 5 children.  Discussed with Dr. Manuella Ghazi - I will wait for Oncology to weigh in later today before Palliative consultation.  Florentina Jenny, PA-C Palliative Medicine Pager: [phone]

## 2018-05-19 NOTE — Care Management (Signed)
Patient is active with [facility] for PT services.

## 2018-05-19 NOTE — H&P (Signed)
TRH H&P   Patient Demographics:    Janet Fletcher, is a 82 y.o. female  MRN: [id number]   DOB - 03-Jan-1936  Admit Date - 05/18/2018  Outpatient Primary MD for the patient is Chevis Pretty, [place]  Referring MD/NP/PA: Nat Christen  Outpatient Specialists:  Katherine Mantle   Patient coming from: home  Chief Complaint  Patient presents with  . Weakness      HPI:    Janet Fletcher  is a 82 y.o. 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    In Ed,  T 98.6, P 79 R 23, Bp 84/40  Pox 93% on RA  CXR IMPRESSION: Cardiomegaly with aortic atherosclerosis. No active pulmonary Disease.  Wbc 1.0, Hgb 5.2, Plt 15 Na 140, K 4.2, Bun 39, Creatinine 1.38 Glucose 130 Alb 3.0 Ast 42, Alt 32, Alk phos 49, T. Bili 1.1  LDH 493 Urinalysis negative  Pt will be admitted for pancytopenia.  / weakness           Review of systems:    In addition to the HPI above, No Fever-chills, No Headache, No changes with Vision or hearing, No problems swallowing food or Liquids, No Chest pain, Cough or Shortness of Breath, No Abdominal pain, No Nausea or Vommitting, Bowel movements are regular, No Blood in stool or Urine, No dysuria, No new skin rashes or bruises, No new joints pains-aches,  No new weakness, tingling, numbness in any extremity, No recent weight gain or loss, No polyuria, polydypsia or polyphagia, No significant Mental Stressors.  A full 10 point Review of Systems was done, except as stated above, all other Review of Systems were negative.   With Past History of the following :    Past Medical History:  Diagnosis Date  . Allergy   . Cancer ([place])    lung  . Hyperlipidemia   . Iron deficiency anemia 12/15/2015  . Low vitamin B12 level 12/15/2015  .  Osteopenia       Past Surgical History:  Procedure Laterality Date  . CATARACT EXTRACTION, BILATERAL Bilateral   . COLONOSCOPY N/A 12/06/2016   Procedure: COLONOSCOPY;  Surgeon: Danie Binder, MD;  Location[facility];  Service: Endoscopy;  Laterality: N/A;  1:45pm  . ESOPHAGOGASTRODUODENOSCOPY N/A 12/06/2016   Procedure: ESOPHAGOGASTRODUODENOSCOPY (EGD);  Surgeon: Danie Binder, MD;  Location: [facility];  Service: Endoscopy;  Laterality: N/A;  . MINOR EXCISION EAR CANAL CYST Left    30+ years ago  . SMALL INTESTINE SURGERY    . VIDEO BRONCHOSCOPY WITH ENDOBRONCHIAL ULTRASOUND N/A 12/11/2015   Procedure: VIDEO BRONCHOSCOPY WITH ENDOBRONCHIAL ULTRASOUND;  Surgeon: Melrose Nakayama, MD;  Location: [facility];  Service: Thoracic;  Laterality: N/A;      Social History:     Social History   Tobacco Use  .  Smoking status: Former Smoker    Packs/day: 0.25    Years: 50.00    Pack years: 12.50    Last attempt to quit: 01/08/2016    Years since quitting: 2.3  . Smokeless tobacco: Never Used  Substance Use Topics  . Alcohol use: No     Lives - at home  Mobility - unclear   Family History :     Family History  Problem Relation Age of Onset  . Cancer Mother   . Hypertension Father   . Colon cancer Neg Hx       Home Medications:   Prior to Admission medications   Medication Sig Start Date End Date Taking? Authorizing Provider  ANORO ELLIPTA 62.5-25 MCG/INH AEPB Inhale 1 puff into the lungs daily.  04/07/18  Yes [provider]  cyanocobalamin (,VITAMIN B-12,) 1000 MCG/ML injection Inject 1 mL (1,000 mcg total) into the muscle every 30 (thirty) days. 03/26/18  Yes Derek Jack, MD  docusate sodium (COLACE) 100 MG capsule Take 100 mg by mouth 2 (two) times daily.    Yes [provider]  donepezil (ARICEPT) 10 MG tablet Take 1 tablet (10 mg total) by mouth at bedtime. 04/20/18  Yes Martin, Mary-Margaret, FNP  memantine (NAMENDA) 5 MG tablet Take 1  tablet (5 mg total) by mouth 2 (two) times daily. 03/31/18  Yes Martin, Mary-Margaret, FNP  ondansetron (ZOFRAN) 8 MG tablet Take 1 tablet (8 mg total) by mouth 2 (two) times daily. Start on the third day from the last day of chemotherapy 11/17/17  Yes Derek Jack, MD  PROAIR HFA 108 [phone] Base) MCG/ACT inhaler Inhale 2 puffs into the lungs every 6 (six) hours as needed for wheezing or shortness of breath.  05/04/18  Yes [provider]  prochlorperazine (COMPAZINE) 10 MG tablet Take 1 tablet (10 mg total) by mouth every 6 (six) hours as needed for nausea or vomiting. 11/17/17  Yes Derek Jack, MD     Allergies:     Allergies  Allergen Reactions  . Flagyl [Metronidazole Hcl] Nausea Only  . Penicillins Other (See Comments)    Has patient had a PCN reaction causing immediate rash, facial/tongue/throat swelling, SOB or lightheadedness with hypotension: unknown Has patient had a PCN reaction causing severe rash involving mucus membranes or skin necrosis: unknown Has patient had a PCN reaction that required hospitalization unknown Has patient had a PCN reaction occurring within the last 10 years: unknown  If all of the above answers are "NO", then may proceed with Cephalosporin use.     Physical Exam:   Vitals  Blood pressure (!) 84/40, pulse 79, temperature 98.6 F (37 C), temperature source Oral, resp. rate (!) 23, SpO2 93 %.   1. General  lying in bed lethargic  2. Normal affect and insight, Not Suicidal or Homicidal, Awake Alert, Oriented X 3.  3. No F.N deficits, ALL C.Nerves Intact, Strength 5/5 all 4 extremities, Sensation intact all 4 extremities, Plantars down going.  4. Ears and Eyes appear Normal, Conjunctivae clear, PERRLA. Moist Oral Mucosa.  5. Supple Neck, No JVD, No cervical lymphadenopathy appriciated, No Carotid Bruits.  6. Symmetrical Chest wall movement, Good air movement bilaterally, CTAB.  7. RRR, No Gallops, Rubs or Murmurs, No  Parasternal Heave.  8. Positive Bowel Sounds, Abdomen Soft, No tenderness, No organomegaly appriciated,No rebound -guarding or rigidity.  9.  No Cyanosis, Normal Skin Turgor, No Skin Rash or Bruise.  10. Good muscle tone,  joints appear normal , no effusions,  Normal ROM.  11. No Palpable Lymph Nodes in Neck or Axillae   No petechiae   Data Review:    CBC Recent Labs  Lab 05/18/18 2210  WBC 1.0*  HGB 5.2*  HCT 16.7*  PLT 15*  MCV 93.3  MCH 29.1  MCHC 31.1  RDW 14.4  LYMPHSABS 0.2*  MONOABS 0.2  EOSABS 0.0  BASOSABS 0.0   ------------------------------------------------------------------------------------------------------------------  Chemistries  Recent Labs  Lab 05/18/18 2210  NA 140  K 4.2  CL 109  CO2 26  GLUCOSE 130*  BUN 39*  CREATININE 1.38*  CALCIUM 8.6*  AST 42*  ALT 32  ALKPHOS 49  BILITOT 1.1   ------------------------------------------------------------------------------------------------------------------ CrCl cannot be calculated (Unknown ideal weight.). ------------------------------------------------------------------------------------------------------------------ No results for input(s): TSH, T4TOTAL, T3FREE, THYROIDAB in the last 72 hours.  Invalid input(s): FREET3  Coagulation profile No results for input(s): INR, PROTIME in the last 168 hours. ------------------------------------------------------------------------------------------------------------------- No results for input(s): DDIMER in the last 72 hours. -------------------------------------------------------------------------------------------------------------------  Cardiac Enzymes No results for input(s): CKMB, TROPONINI, MYOGLOBIN in the last 168 hours.  Invalid input(s): CK ------------------------------------------------------------------------------------------------------------------ No results found for:  BNP   ---------------------------------------------------------------------------------------------------------------  Urinalysis    Component Value Date/Time   COLORURINE YELLOW 05/18/2018 2227   APPEARANCEUR HAZY (A) 05/18/2018 2227   LABSPEC 1.017 05/18/2018 2227   PHURINE 5.0 05/18/2018 2227   GLUCOSEU NEGATIVE 05/18/2018 2227   HGBUR NEGATIVE 05/18/2018 2227   BILIRUBINUR NEGATIVE 05/18/2018 2227   BILIRUBINUR NEG 09/18/2015 1037   KETONESUR NEGATIVE 05/18/2018 2227   PROTEINUR NEGATIVE 05/18/2018 2227   UROBILINOGEN negative 09/18/2015 1037   NITRITE NEGATIVE 05/18/2018 2227   LEUKOCYTESUR NEGATIVE 05/18/2018 2227    ----------------------------------------------------------------------------------------------------------------   Imaging Results:    Dg Chest 2 View  Result Date: 05/18/2018 CLINICAL DATA:  Weakness x4 days with cough. History of lung cancer. Former smoker. EXAM: CHEST - 2 VIEW COMPARISON:  Chest CT 04/28/2018, CXR 01/12/2018 FINDINGS: Cardiomegaly with mild aortic atherosclerosis. Port catheter tip terminates in the distal SVC. Mild bilateral hilar prominence similar to prior. No acute pulmonary consolidation, effusion or pneumothorax. IMPRESSION: Cardiomegaly with aortic atherosclerosis. No active pulmonary disease. Electronically Signed   By: Ashley Royalty M.D.   On: 05/18/2018 23:31    ekg nsr at 90, nl axis, t inversion in v1-3    Assessment & Plan:    Principal Problem:   Pancytopenia ([place]) Active Problems:   Iron deficiency anemia   Anemia in chronic kidney disease (CKD)   Hypotension    Generalized weakness Check CT brain  Pancytopenia Transfuse 2 units prbc as ordered by ED.  Neutropenic precautions Check cbc in am Please consult oncology regarding pancytopenia  Hypotension Check trop   Iron def anemia b12 deficiency     DVT Prophylaxis SCD AM Labs Ordered, also please review Full Orders  Family Communication: Admission,  patients condition and plan of care including tests being ordered have been discussed with the patient and daughter   who indicate understanding and agree with the plan and Code Status.  Code Status  DNR  Likely DC to  home  Condition GUARDED   Consults called: please call oncology in AM  Admission status: observation  Time spent in minutes : 70     Jani Gravel M.D on 05/19/2018 at 12:05 AM  Between 7am to 7pm - Pager - [phone]   After 7pm go to [email or web address] - password [facility]  [facility] - Office  [phone]

## 2018-05-19 NOTE — Progress Notes (Signed)
PROGRESS NOTE    Janet Fletcher  WYO:[id number] DOB: Dec 07, 1935 DOA: 05/18/2018 PCP: Chevis Pretty, FNP   Brief Narrative:   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  She has received 2 units of PRBC transfusion and is currently being monitored in the ICU.  Assessment & Plan:   Principal Problem:   Pancytopenia ([place]) Active Problems:   Iron deficiency anemia   Anemia in chronic kidney disease (CKD)   Hypotension   1. Generalized weakness likely secondary to worsening pancytopenia in the setting of MDS.  Consult to oncology for further assistance in management and discussion with family members regarding further care options at this point.  Palliative care has also appropriately been consulted and I believe that patient may be a candidate for hospice soon. 2. Worsening anemia likely secondary to acute blood loss anemia.  Patient continues to have black, tarry stools as well as FOBT that has been positive.  Continue to check CBC in a.m. and transfuse further as needed for symptomatic management.  Appreciate further oncology recommendations. 3. Small cell lung cancer.  Further recommendations per oncology.   DVT prophylaxis: SCD Code Status: DNR Family Communication: 4 daughters at bedside Disposition Plan: Plan for further discussion with oncology and subsequently palliative care to determine further course of treatment   Consultants:   Oncology  Palliative care  Procedures:   None  Antimicrobials:   None   Subjective: Patient seen and evaluated today with no new acute complaints or concerns. No acute concerns or events noted overnight.  She states that she continues to feel weak.  Objective: Vitals:   05/19/18 0747 05/19/18 0800 05/19/18  1142 05/19/18 1216  BP:  (!) 116/51    Pulse:  82    Resp:  (!) 23    Temp: 98 F (36.7 C)  97.6 F (36.4 C)   TempSrc: Oral  Oral   SpO2:  95%  94%  Weight:      Height:        Intake/Output Summary (Last 24 hours) at 05/19/2018 1343 Last data filed at 05/19/2018 1142 Gross per 24 hour  Intake 2122.84 ml  Output -  Net 2122.84 ml   Filed Weights   05/19/18 0138  Weight: 62.2 kg    Examination:  General exam: Somnolent Respiratory system: Clear to auscultation. Respiratory effort normal. Cardiovascular system: S1 & S2 heard, RRR. No JVD, murmurs, rubs, gallops or clicks. No pedal edema. Gastrointestinal system: Abdomen is nondistended, soft and nontender. No organomegaly or masses felt. Normal bowel sounds heard. Central nervous system: Somnolent Extremities: Symmetric 5 x 5 power. Skin: No rashes, lesions or ulcers Psychiatry: Cannot be assessed.    Data Reviewed: I have personally reviewed following labs and imaging studies  CBC: Recent Labs  Lab 05/18/18 2210 05/19/18 0734  WBC 1.0* 1.4*  NEUTROABS 0.6*  --   HGB 5.2* 6.6*  HCT 16.7* 20.5*  MCV 93.3 89.1  PLT 15* 16*   Basic Metabolic Panel: Recent Labs  Lab 05/18/18 2210 05/19/18 0734  NA 140 142  K 4.2 4.1  CL 109 113*  CO2 26 24  GLUCOSE 130* 101*  BUN 39* 33*  CREATININE 1.38* 1.23*  CALCIUM 8.6* 8.4*   GFR: Estimated Creatinine Clearance: 32.8 mL/min (A) (by C-G formula based on SCr of 1.23 mg/dL (H)).  Liver Function Tests: Recent Labs  Lab 05/18/18 2210 05/19/18 0734  AST 42* 40  ALT 32 31  ALKPHOS 49 53  BILITOT 1.1 1.7*  PROT 6.0* 5.7*  ALBUMIN 3.0* 2.9*   No results for input(s): LIPASE, AMYLASE in the last 168 hours. No results for input(s): AMMONIA in the last 168 hours. Coagulation Profile: No results for input(s): INR, PROTIME in the last 168 hours. Cardiac Enzymes: Recent Labs  Lab 05/19/18 0734  TROPONINI <0.03   BNP (last 3 results) No results for input(s):  PROBNP in the last 8760 hours. HbA1C: No results for input(s): HGBA1C in the last 72 hours. CBG: No results for input(s): GLUCAP in the last 168 hours. Lipid Profile: No results for input(s): CHOL, HDL, LDLCALC, TRIG, CHOLHDL, LDLDIRECT in the last 72 hours. Thyroid Function Tests: No results for input(s): TSH, T4TOTAL, FREET4, T3FREE, THYROIDAB in the last 72 hours. Anemia Panel: No results for input(s): VITAMINB12, FOLATE, FERRITIN, TIBC, IRON, RETICCTPCT in the last 72 hours. Sepsis Labs: No results for input(s): PROCALCITON, LATICACIDVEN in the last 168 hours.  Recent Results (from the past 240 hour(s))  MRSA PCR Screening     Status: None   Collection Time: 05/19/18  1:24 AM  Result Value Ref Range Status   MRSA by PCR NEGATIVE NEGATIVE Final    Comment:        The GeneXpert MRSA Assay (FDA approved for NASAL specimens only), is one component of a comprehensive MRSA colonization surveillance program. It is not intended to diagnose MRSA infection nor to guide or monitor treatment for MRSA infections. Performed at [facility], [address], [place], [place] [address]          Radiology Studies: Dg Chest 2 View  Result Date: 05/18/2018 CLINICAL DATA:  Weakness x4 days with cough. History of lung cancer. Former smoker. EXAM: CHEST - 2 VIEW COMPARISON:  Chest CT 04/28/2018, CXR 01/12/2018 FINDINGS: Cardiomegaly with mild aortic atherosclerosis. Port catheter tip terminates in the distal SVC. Mild bilateral hilar prominence similar to prior. No acute pulmonary consolidation, effusion or pneumothorax. IMPRESSION: Cardiomegaly with aortic atherosclerosis. No active pulmonary disease. Electronically Signed   By: Ashley Royalty M.D.   On: 05/18/2018 23:31   Ct Head Wo Contrast  Result Date: 05/19/2018 CLINICAL DATA:  82 year old female with generalized weakness. EXAM: CT HEAD WITHOUT CONTRAST TECHNIQUE: Contiguous axial images were obtained from the base of the skull through  the vertex without intravenous contrast. COMPARISON:  Brain MRI dated 08/22/2017 FINDINGS: Brain: There is mild age-related atrophy and moderate chronic microvascular ischemic changes. There is no acute intracranial hemorrhage. No mass effect or midline shift. No extra-axial fluid collection. Vascular: No hyperdense vessel or unexpected calcification. Skull: Normal. Negative for fracture or focal lesion. Sinuses/Orbits: No acute finding. Other: None IMPRESSION: 1. No acute intracranial hemorrhage. 2. Age related atrophy and chronic microvascular ischemic changes. Electronically Signed   By: Anner Crete M.D.   On: 05/19/2018 01:58        Scheduled Meds: . docusate sodium  100 mg Oral BID  . donepezil  10 mg Oral QHS  . feeding supplement (ENSURE ENLIVE)  237 mL Oral BID BM  . memantine  5 mg Oral BID  . sodium chloride flush  3 mL Intravenous Q12H  . umeclidinium-vilanterol  1 puff Inhalation Daily   Continuous Infusions: . sodium chloride       LOS: 0 days    Time spent: 30 minutes    Elysia Grand Darleen Crocker, DO  [facility] Pager [phone]  If 7PM-7AM, please contact night-coverage [email or web address] Password [email or web address] 05/19/2018, 1:43 PM

## 2018-05-19 NOTE — Consult Note (Signed)
[facility] Consultation Oncology  Name: Janet Fletcher      MRN: [id number]    Location: IC04/IC04-01  Date: 05/19/2018 Time:5:07 PM   REFERRING PHYSICIAN: Dr. Manuella Ghazi  REASON FOR CONSULT: Severe anemia and thrombocytopenia   DIAGNOSIS: Myelosuppression from MDS treatment and GI bleed contributing to anemia.  HISTORY OF PRESENT ILLNESS: She is 82 year old very pleasant African-American female who is seen in consultation today for severe anemia.  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  She was started on azacitidine in April.  She received cycle 6 on 04/27/2018.  She was found to be very lethargic and pale yesterday evening and was brought in to the ER by her daughters.  She was found to be severely low hemoglobin of 5.2.  She did receive blood transfusions.  She was also found to have low platelet count of 15.  Her white count was also low at presentation at 1.0 with ANC of 600.  She was afebrile.  She felt much better after blood transfusions.  Her daughters also noticed that her stool was very dark prior to her presentation to the ER.  She had a history of gastritis on a prior endoscopy in May 2018.  She denies any fevers prior to presentation.  PAST MEDICAL HISTORY:   Past Medical History:  Diagnosis Date  . Allergy   . Cancer ([place])    lung  . Hyperlipidemia   . Iron deficiency anemia 12/15/2015  . Low vitamin B12 level 12/15/2015  . Osteopenia     ALLERGIES: Allergies  Allergen Reactions  . Flagyl [Metronidazole Hcl] Nausea Only  . Penicillins Other (See Comments)    Has patient had a PCN reaction causing immediate rash, facial/tongue/throat swelling, SOB or lightheadedness with hypotension: unknown Has patient had a PCN reaction causing severe rash involving mucus membranes or skin necrosis: unknown Has patient had a PCN reaction that required hospitalization unknown Has patient had a PCN reaction occurring within the  last 10 years: unknown  If all of the above answers are "NO", then may proceed with Cephalosporin use.      MEDICATIONS: I have reviewed the patient's current medications.     PAST SURGICAL HISTORY Past Surgical History:  Procedure Laterality Date  . CATARACT EXTRACTION, BILATERAL Bilateral   . COLONOSCOPY N/A 12/06/2016   Procedure: COLONOSCOPY;  Surgeon: Danie Binder, MD;  [facility];  Service: Endoscopy;  Laterality: N/A;  1:45pm  . ESOPHAGOGASTRODUODENOSCOPY N/A 12/06/2016   Procedure: ESOPHAGOGASTRODUODENOSCOPY (EGD);  Surgeon: Danie Binder, MD;  Location[facility];  Service: Endoscopy;  Laterality: N/A;  . MINOR EXCISION EAR CANAL CYST Left    30+ years ago  . SMALL INTESTINE SURGERY    . VIDEO BRONCHOSCOPY WITH ENDOBRONCHIAL ULTRASOUND N/A 12/11/2015   Procedure: VIDEO BRONCHOSCOPY WITH ENDOBRONCHIAL ULTRASOUND;  Surgeon: Melrose Nakayama, MD;  Location: [place];  Service: Thoracic;  Laterality: N/A;    FAMILY HISTORY: Family History  Problem Relation Age of Onset  . Cancer Mother   . Hypertension Father   . Colon cancer Neg Hx     SOCIAL HISTORY:  reports that she quit smoking about 2 years ago. She has a 12.50 pack-year smoking history. She has never used smokeless tobacco. She reports that she does not drink alcohol or use drugs.  PERFORMANCE STATUS: The patient's performance status is 2 - Symptomatic, <50% confined to bed  PHYSICAL EXAM: Most Recent Vital Signs: Blood  pressure (!) 116/51, pulse 82, temperature 97.8 F (36.6 C), temperature source Oral, resp. rate (!) 23, height '5\' 5"'  (1.651 m), weight 137 lb 2 oz (62.2 kg), SpO2 94 %. BP (!) 116/51   Pulse 82   Temp 97.8 F (36.6 C) (Oral)   Resp (!) 23   Ht '5\' 5"'  (1.651 m)   Wt 137 lb 2 oz (62.2 kg)   SpO2 94%   BMI 22.82 kg/m  General appearance: alert and cooperative Neck: no adenopathy and supple, symmetrical, trachea midline Lungs: clear to auscultation bilaterally Heart:  regular rate and rhythm Abdomen: soft, non-tender; bowel sounds normal; no masses,  no organomegaly Extremities: extremities normal, atraumatic, no cyanosis or edema Skin: Skin color, texture, turgor normal. No rashes or lesions Neurologic: Grossly normal  LABORATORY DATA:  Results for orders placed or performed during the hospital encounter of 05/18/18 (from the past 48 hour(s))  Type and screen     Status: None (Preliminary result)   Collection Time: 05/18/18 10:10 PM  Result Value Ref Range   ABO/RH(D) A POS    Antibody Screen NEG    Sample Expiration 05/21/2018    Unit Number [id number]    Blood Component Type RED CELLS,LR    Unit division 00    Status of Unit ISSUED    Transfusion Status OK TO TRANSFUSE    Crossmatch Result      Compatible Performed at [facility], [address] [place], [place] [address]    Unit Number [id number]    Blood Component Type RED CELLS,LR    Unit division 00    Status of Unit ISSUED    Transfusion Status OK TO TRANSFUSE    Crossmatch Result Compatible   CBC with Differential/Platelet     Status: Abnormal   Collection Time: 05/18/18 10:10 PM  Result Value Ref Range   WBC 1.0 (LL) 4.0 - 10.5 K/uL    Comment: REPEATED TO VERIFY WHITE COUNT CONFIRMED ON SMEAR THIS CRITICAL RESULT HAS VERIFIED AND BEEN CALLED TO WALL,E BY BOBBIE MATTHEWS ON 10 28 2019 AT 2333, AND HAS BEEN READ BACK. CRITICAL RESULTS VERIFED    RBC 1.79 (L) 3.87 - 5.11 MIL/uL   Hemoglobin 5.2 (LL) 12.0 - 15.0 g/dL    Comment: REPEATED TO VERIFY THIS CRITICAL RESULT HAS VERIFIED AND BEEN CALLED TO WALL,E BY BOBBIE MATTHEWS ON 10 28 2019 AT 2333, AND HAS BEEN READ BACK. CRITICAL RESULTS VERIFED    HCT 16.7 (L) 36.0 - 46.0 %   MCV 93.3 80.0 - 100.0 fL   MCH 29.1 26.0 - 34.0 pg   MCHC 31.1 30.0 - 36.0 g/dL   RDW 14.4 11.5 - 15.5 %   Platelets 15 (LL) 150 - 400 K/uL    Comment: PLATELET COUNT CONFIRMED BY SMEAR SPECIMEN CHECKED FOR CLOTS Immature Platelet Fraction may  be clinically indicated, consider ordering this additional test [id number] THIS CRITICAL RESULT HAS VERIFIED AND BEEN CALLED TO WALL,E BY BOBBIE MATTHEWS ON 10 28 2019 AT 2333, AND HAS BEEN READ BACK. CRITICAL RESULTS VERIFED    nRBC 28.7 (H) 0.0 - 0.2 %   Neutrophils Relative % 60 %   Neutro Abs 0.6 (L) 1.7 - 7.7 K/uL   Lymphocytes Relative 20 %   Lymphs Abs 0.2 (L) 0.7 - 4.0 K/uL   Monocytes Relative 19 %   Monocytes Absolute 0.2 0.1 - 1.0 K/uL   Eosinophils Relative 0 %   Eosinophils Absolute 0.0 0.0 - 0.5 K/uL   Basophils Relative  0 %   Basophils Absolute 0.0 0.0 - 0.1 K/uL   RBC Morphology NUCLEATED RBC PRESENT    Immature Granulocytes 1 %   Abs Immature Granulocytes 0.01 0.00 - 0.07 K/uL   Polychromasia PRESENT    Basophilic Stippling PRESENT     Comment: Performed at [facility], [address], [place], [place] [address]  Comprehensive metabolic panel     Status: Abnormal   Collection Time: 05/18/18 10:10 PM  Result Value Ref Range   Sodium 140 135 - 145 mmol/L   Potassium 4.2 3.5 - 5.1 mmol/L   Chloride 109 98 - 111 mmol/L   CO2 26 22 - 32 mmol/L   Glucose, Bld 130 (H) 70 - 99 mg/dL   BUN 39 (H) 8 - 23 mg/dL   Creatinine, Ser 1.38 (H) 0.44 - 1.00 mg/dL   Calcium 8.6 (L) 8.9 - 10.3 mg/dL   Total Protein 6.0 (L) 6.5 - 8.1 g/dL   Albumin 3.0 (L) 3.5 - 5.0 g/dL   AST 42 (H) 15 - 41 U/L   ALT 32 0 - 44 U/L   Alkaline Phosphatase 49 38 - 126 U/L   Total Bilirubin 1.1 0.3 - 1.2 mg/dL   GFR calc non Af Amer 35 (L) >60 mL/min   GFR calc Af Amer 41 (L) >60 mL/min    Comment: (NOTE) The eGFR has been calculated using the CKD EPI equation. This calculation has not been validated in all clinical situations. eGFR's persistently <60 mL/min signify possible Chronic Kidney Disease.    Anion gap 5 5 - 15    Comment: Performed at [facility], [address] [place], [place] [address]  Prepare RBC     Status: None   Collection Time: 05/18/18 10:10 PM  Result Value Ref Range    Order Confirmation      ORDER PROCESSED BY BLOOD BANK Performed at [facility], [address], [place], [place] [address]   Lactate dehydrogenase     Status: Abnormal   Collection Time: 05/18/18 10:10 PM  Result Value Ref Range   LDH 493 (H) 98 - 192 U/L    Comment: Performed at [facility], [address], [place], [place] [address]  Urinalysis, Routine w reflex microscopic     Status: Abnormal   Collection Time: 05/18/18 10:27 PM  Result Value Ref Range   Color, Urine YELLOW YELLOW   APPearance HAZY (A) CLEAR   Specific Gravity, Urine 1.017 1.005 - 1.030   pH 5.0 5.0 - 8.0   Glucose, UA NEGATIVE NEGATIVE mg/dL   Hgb urine dipstick NEGATIVE NEGATIVE   Bilirubin Urine NEGATIVE NEGATIVE   Ketones, ur NEGATIVE NEGATIVE mg/dL   Protein, ur NEGATIVE NEGATIVE mg/dL   Nitrite NEGATIVE NEGATIVE   Leukocytes, UA NEGATIVE NEGATIVE    Comment: Performed at [facility], [address], [place], [place] [address]  Save Smear     Status: None   Collection Time: 05/19/18 12:13 AM  Result Value Ref Range   Smear Review SMEAR STAINED AND AVAILABLE FOR REVIEW     Comment: Performed at [facility], [address] [place], [place] [address]  MRSA PCR Screening     Status: None   Collection Time: 05/19/18  1:24 AM  Result Value Ref Range   MRSA by PCR NEGATIVE NEGATIVE    Comment:        The GeneXpert MRSA Assay (FDA approved for NASAL specimens only), is one component of a comprehensive MRSA colonization surveillance program. It  is not intended to diagnose MRSA infection nor to guide or monitor treatment for MRSA infections. Performed at [facility], [address], [place], [place] [address]   Comprehensive metabolic panel     Status: Abnormal   Collection Time: 05/19/18  7:34 AM  Result Value Ref Range   Sodium 142 135 - 145 mmol/L   Potassium 4.1 3.5 - 5.1 mmol/L   Chloride 113 (H) 98 - 111 mmol/L   CO2 24 22 - 32 mmol/L   Glucose, Bld 101 (H) 70 - 99 mg/dL   BUN 33 (H) 8 - 23  mg/dL   Creatinine, Ser 1.23 (H) 0.44 - 1.00 mg/dL   Calcium 8.4 (L) 8.9 - 10.3 mg/dL   Total Protein 5.7 (L) 6.5 - 8.1 g/dL   Albumin 2.9 (L) 3.5 - 5.0 g/dL   AST 40 15 - 41 U/L   ALT 31 0 - 44 U/L   Alkaline Phosphatase 53 38 - 126 U/L   Total Bilirubin 1.7 (H) 0.3 - 1.2 mg/dL   GFR calc non Af Amer 40 (L) >60 mL/min   GFR calc Af Amer 47 (L) >60 mL/min    Comment: (NOTE) The eGFR has been calculated using the CKD EPI equation. This calculation has not been validated in all clinical situations. eGFR's persistently <60 mL/min signify possible Chronic Kidney Disease.    Anion gap 5 5 - 15    Comment: Performed at [facility], [address] [place], [place] [address]  CBC     Status: Abnormal   Collection Time: 05/19/18  7:34 AM  Result Value Ref Range   WBC 1.4 (LL) 4.0 - 10.5 K/uL    Comment: REPEATED TO VERIFY WHITE COUNT CONFIRMED ON SMEAR THIS CRITICAL RESULT HAS VERIFIED AND BEEN CALLED TO EMURPHY BY HILLARY FLYNT ON 10 29 2019 AT 0833, AND HAS BEEN READ BACK. CRITICAL RESULT VERIFIED    RBC 2.30 (L) 3.87 - 5.11 MIL/uL   Hemoglobin 6.6 (LL) 12.0 - 15.0 g/dL    Comment: REPEATED TO VERIFY THIS CRITICAL RESULT HAS VERIFIED AND BEEN CALLED TO EMURPHY BY HILLARY FLYNT ON 10 29 2019 AT 0833, AND HAS BEEN READ BACK. CRITICAL RESULT VERIFIED    HCT 20.5 (L) 36.0 - 46.0 %   MCV 89.1 80.0 - 100.0 fL   MCH 28.7 26.0 - 34.0 pg   MCHC 32.2 30.0 - 36.0 g/dL   RDW 14.9 11.5 - 15.5 %   Platelets 16 (LL) 150 - 400 K/uL    Comment: PLATELET COUNT CONFIRMED BY SMEAR SPECIMEN CHECKED FOR CLOTS Immature Platelet Fraction may be clinically indicated, consider ordering this additional test [id number] THIS CRITICAL RESULT HAS VERIFIED AND BEEN CALLED TO EMURPHY BY HILLARY FLYNT ON 10 29 2019 AT 0833, AND HAS BEEN READ BACK. CRITICAL RESULT VERIFIED    nRBC 16.1 (H) 0.0 - 0.2 %    Comment: Performed at [facility], [address], [place], [place] [address]  Troponin I     Status: None    Collection Time: 05/19/18  7:34 AM  Result Value Ref Range   Troponin I <0.03 <0.03 ng/mL    Comment: Performed at [facility], [address], [place], [place] [address]  Troponin I     Status: None   Collection Time: 05/19/18  1:02 PM  Result Value Ref Range   Troponin I <0.03 <0.03 ng/mL    Comment: Performed at [facility], [address], [place], [place] [address]  RADIOGRAPHY: Dg Chest 2 View  Result Date: 05/18/2018 CLINICAL DATA:  Weakness x4 days with cough. History of lung cancer. Former smoker. EXAM: CHEST - 2 VIEW COMPARISON:  Chest CT 04/28/2018, CXR 01/12/2018 FINDINGS: Cardiomegaly with mild aortic atherosclerosis. Port catheter tip terminates in the distal SVC. Mild bilateral hilar prominence similar to prior. No acute pulmonary consolidation, effusion or pneumothorax. IMPRESSION: Cardiomegaly with aortic atherosclerosis. No active pulmonary disease. Electronically Signed   By: Ashley Royalty M.D.   On: 05/18/2018 23:31   Ct Head Wo Contrast  Result Date: 05/19/2018 CLINICAL DATA:  82 year old female with generalized weakness. EXAM: CT HEAD WITHOUT CONTRAST TECHNIQUE: Contiguous axial images were obtained from the base of the skull through the vertex without intravenous contrast. COMPARISON:  Brain MRI dated 08/22/2017 FINDINGS: Brain: There is mild age-related atrophy and moderate chronic microvascular ischemic changes. There is no acute intracranial hemorrhage. No mass effect or midline shift. No extra-axial fluid collection. Vascular: No hyperdense vessel or unexpected calcification. Skull: Normal. Negative for fracture or focal lesion. Sinuses/Orbits: No acute finding. Other: None IMPRESSION: 1. No acute intracranial hemorrhage. 2. Age related atrophy and chronic microvascular ischemic changes. Electronically Signed   By: Anner Crete M.D.   On: 05/19/2018 01:58        ASSESSMENT and PLAN: 1.  Normocytic anemia: - This is from a combination of myelosuppression  from azacitidine and GI bleed.  She was also anemic and transfusion dependent prior to start of her therapy for MDS in April. - Hemoglobin on presentation was 5.2, which improved to 6.6 upon transfusion.  I would recommend 1 more unit of blood transfusion tonight. - Given the active GI bleed, I would make sure her hemoglobin is stable prior to her discharge. - I have recommended doing a bone marrow biopsy upon her discharge to see if there is any response to her treatments.  She received cycle 6 of azacitidine on 04/27/2018.  If there is no response, we will discontinue all active treatments.  2.  Severe thrombocytopenia: -Platelet count on presentation was 15.  This was from myelosuppression. - As she also has GI bleed, we will transfuse her to keep platelet count around 40-50,000.  She will receive 1 unit of platelet transfusion tonight.  3.  Severe neutropenia: -Her neutrophil count was 600.  Today her white count improved to 1.4.  This is directly related to her myelosuppressive therapy.  She is afebrile at this time.  All questions were answered. The patient knows to call the clinic with any problems, questions or concerns. We can certainly see the patient much sooner if necessary.   Derek Jack

## 2018-05-19 NOTE — Progress Notes (Signed)
CRITICAL VALUE ALERT  Critical Value:  WBC: 1.4 Hgb 6.6 plt: 16  Date & Time Notied:  05/19/18 @ 0830  Provider Notified: Dr. Manuella Ghazi  Orders Received/Actions taken: no new orders at this time

## 2018-05-19 NOTE — Consult Note (Signed)
Consultation Note Date: 05/20/2018   Patient Name: Janet Fletcher  DOB: [birth year]  MRN: [id number]  Age / Sex: 82 y.o., female  PCP: Janet Fletcher, [place] Referring Physician: Rodena Goldmann, DO  Reason for Consultation: Establishing goals of care and Psychosocial/spiritual support  HPI/Patient Profile: 82 y.o. 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  At the time of admission her Hgb was 5.3.  Her platelets and wbc were also extremely low.  She was having dark stools and was diagnosed with a GI bleed.  CT head was done for completeness and showed no acute changes.    Clinical Assessment and Goals of Care:  I have reviewed medical records including EPIC notes, labs and imaging, received report from Dr. Manuella Fletcher, assessed the patient and then met at the bedside along with 3 of her daughters and 1 grand daughter to discuss diagnosis prognosis, GOC,disposition and options.  I introduced Palliative Medicine as specialized medical care for people living with serious illness. It focuses on providing relief from the symptoms and stress of a serious illness. The goal is to improve quality of life for both the patient and the family.  We discussed a brief life review of the patient. She raised 7 children and worked two jobs.  For 42 years she worked in a [employer].  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  Two of her 7 children have passed away.  She has 4 daughters and 1 son left.  Her daughter Janet Fletcher manages her financial and business affairs.  Her other children tend to her personal care.  Fortunately all of the children live locally and are very support of their mother and each other.  Mrs. Janet Fletcher is a Janet Fletcher and still enjoys going to  church occasionally.    As far as functional and nutritional status - Mrs. Janet Fletcher lives at home with her husband (children's step father).  He is in relatively good condition - still able to walk.  The family requested physical therapy come to the house about a month ago.  This was good for Mrs. Janet Fletcher as it encouraged her to be up and walking around the house.  Mrs. Janet Fletcher gets joy from "keeping her family in line" and visits from her grand children.    The family warmly received Palliative and expressed their gratitude for Dr. Manuella Fletcher and Dr. Tomie Fletcher medical explanation of their mother's health.  They are comfortable with their understanding of her illness and comfortable with the plan of bone marrow biopsy.    I attempted to elicit values and goals of care important to the patient.  The difference between aggressive medical intervention and comfort care was considered in light of the patient's goals of care.  At this point the family feels that their mother has a good quality of life and they are hopeful for effective treatments.  I explained Palliative services here in the hospital and outpatient.  The family is receptive to Palliative support along with continued Oncological therapy for their mother.  Questions and concerns were addressed.  The family was encouraged to call with questions or concerns.    Primary Decision Maker:  NEXT OF KIN her children work together as a Therapist, occupational.    SUMMARY OF RECOMMENDATIONS    Home with home health PT and RN Please offer Palliative services out patient at discharge - to help support and educate thru bone marrow biopsy and results. Continue full scope treatment.  Code Status/Advance Care Planning:  DNR  Psycho-social/Spiritual:   Desire for further Chaplaincy support: welcomed.  Prognosis:  To be determined.  If BM biopsy indicates that treatments have not been effective prognosis will likely be weeks to months and she will be immediately  eligible for hospice services.   Discharge Planning: Home with Home Health and offer Palliative services to support thru biopsy and results.      Primary Diagnoses: Present on Admission: . Pancytopenia ([place]) . Iron deficiency anemia . Anemia in chronic kidney disease (CKD) . Hypotension   I have reviewed the medical record, interviewed the patient and family, and examined the patient. The following aspects are pertinent.  Past Medical History:  Diagnosis Date  . Allergy   . Cancer ([place])    lung  . Hyperlipidemia   . Iron deficiency anemia 12/15/2015  . Low vitamin B12 level 12/15/2015  . Osteopenia    Social History   Socioeconomic History  . Marital status: Married    Spouse name: Not on file  . Number of children: Not on file  . Years of education: Not on file  . Highest education level: Not on file  Occupational History  . Not on file  Social Needs  . Financial resource strain: Not on file  . Food insecurity:    Worry: Not on file    Inability: Not on file  . Transportation needs:    Medical: Not on file    Non-medical: Not on file  Tobacco Use  . Smoking status: Former Smoker    Packs/day: 0.25    Years: 50.00    Pack years: 12.50    Last attempt to quit: 01/08/2016    Years since quitting: 2.3  . Smokeless tobacco: Never Used  Substance and Sexual Activity  . Alcohol use: No  . Drug use: No  . Sexual activity: Not Currently  Lifestyle  . Physical activity:    Days per week: Not on file    Minutes per session: Not on file  . Stress: Not on file  Relationships  . Social connections:    Talks on phone: Not on file    Gets together: Not on file    Attends religious service: Not on file    Active member of club or organization: Not on file    Attends meetings of clubs or organizations: Not on file    Relationship status: Not on file  Other Topics Concern  . Not on file  Social History Narrative  . Not on file   Family History  Problem Relation  Age of Onset  . Cancer Mother   . Hypertension Father   . Colon cancer Neg Hx    Scheduled Meds: . docusate sodium  100 mg Oral BID  . donepezil  10 mg Oral QHS  . feeding supplement (ENSURE ENLIVE)  237 mL Oral BID BM  . memantine  5 mg Oral BID  . sodium chloride flush  3 mL Intravenous Q12H  . umeclidinium-vilanterol  1 puff Inhalation Daily   Continuous Infusions: . sodium chloride     PRN Meds:.sodium chloride, acetaminophen **OR** acetaminophen, albuterol, prochlorperazine, sodium chloride flush Allergies  Allergen Reactions  . Flagyl [Metronidazole Hcl] Nausea Only  . Penicillins Other (See Comments)    Has patient had a PCN reaction causing immediate rash, facial/tongue/throat swelling, SOB or lightheadedness with hypotension: unknown Has patient had a PCN reaction causing severe rash involving mucus membranes or skin necrosis: unknown Has patient had a PCN reaction that required hospitalization unknown Has patient had a PCN reaction occurring within the last 10 years: unknown  If all of the above answers are "NO", then may proceed with Cephalosporin use.   Review of Systems patient fatigued and only engages in a limited fashion in conversation  Physical Exam  Well developed elderly female, awake, alert, no distress, appears fatigued.  Appropriate.  Vital Signs: BP (!) 116/51   Pulse 82   Temp 97.6 F (36.4 C) (Oral)   Resp (!) 23   Ht '5\' 5"'  (1.651 m)   Wt 62.2 kg   SpO2 94%   BMI 22.82 kg/m  Pain Scale: 0-10   Pain Score: 0-No pain   SpO2: SpO2: 94 % O2 Device:SpO2: 94 % O2 Flow Rate: .   IO: Intake/output summary:   Intake/Output Summary (Last 24 hours) at 05/19/2018 1247 Last data filed at 05/19/2018 1142 Gross per 24 hour  Intake 2122.84 ml  Output -  Net 2122.84 ml    LBM: Last BM Date: 05/19/18 Baseline Weight: Weight: 62.2 kg Most recent weight: Weight: 62.2 kg     Palliative Assessment/Data: 50%     Time In: 8:40 Time Out:  9:30 Time Total: 50 min. Greater than 50%  of this time was spent counseling and coordinating care related to the above assessment and plan.  Signed by: Florentina Jenny, PA-C Palliative Medicine Pager: [phone]  Please contact Palliative Medicine Team phone at [phone] for questions and concerns.  For individual provider: See Shea Evans

## 2018-05-20 DIAGNOSIS — Z515 Encounter for palliative care: Secondary | ICD-10-CM

## 2018-05-20 DIAGNOSIS — D469 Myelodysplastic syndrome, unspecified: Secondary | ICD-10-CM

## 2018-05-20 DIAGNOSIS — C3490 Malignant neoplasm of unspecified part of unspecified bronchus or lung: Secondary | ICD-10-CM

## 2018-05-20 LAB — CBC
HCT: 23.8 % — ABNORMAL LOW (ref 36.0–46.0)
HEMOGLOBIN: 7.8 g/dL — AB (ref 12.0–15.0)
MCH: 29.9 pg (ref 26.0–34.0)
MCHC: 32.8 g/dL (ref 30.0–36.0)
MCV: 91.2 fL (ref 80.0–100.0)
Platelets: 51 10*3/uL — ABNORMAL LOW (ref 150–400)
RBC: 2.61 MIL/uL — ABNORMAL LOW (ref 3.87–5.11)
RDW: 16.1 % — AB (ref 11.5–15.5)
WBC: 1.3 10*3/uL — CL (ref 4.0–10.5)
nRBC: 9.7 % — ABNORMAL HIGH (ref 0.0–0.2)

## 2018-05-20 LAB — BPAM PLATELET PHERESIS
BLOOD PRODUCT EXPIRATION DATE: 201911012359
ISSUE DATE / TIME: 201910292144
UNIT TYPE AND RH: 6200

## 2018-05-20 LAB — BPAM RBC
BLOOD PRODUCT EXPIRATION DATE: 201911052359
BLOOD PRODUCT EXPIRATION DATE: 201911052359
BLOOD PRODUCT EXPIRATION DATE: 201911272359
ISSUE DATE / TIME: 201910290054
ISSUE DATE / TIME: 201910290308
ISSUE DATE / TIME: 201910291857
UNIT TYPE AND RH: 6200
Unit Type and Rh: 6200
Unit Type and Rh: 6200

## 2018-05-20 LAB — TYPE AND SCREEN
ABO/RH(D): A POS
ANTIBODY SCREEN: NEGATIVE
UNIT DIVISION: 0
UNIT DIVISION: 0
Unit division: 0

## 2018-05-20 LAB — BASIC METABOLIC PANEL
Anion gap: 5 (ref 5–15)
BUN: 29 mg/dL — AB (ref 8–23)
CHLORIDE: 111 mmol/L (ref 98–111)
CO2: 26 mmol/L (ref 22–32)
CREATININE: 1.09 mg/dL — AB (ref 0.44–1.00)
Calcium: 8.4 mg/dL — ABNORMAL LOW (ref 8.9–10.3)
GFR calc Af Amer: 54 mL/min — ABNORMAL LOW (ref >60)
GFR calc non Af Amer: 47 mL/min — ABNORMAL LOW (ref >60)
Glucose, Bld: 91 mg/dL (ref 70–99)
Potassium: 3.7 mmol/L (ref 3.5–5.1)
SODIUM: 142 mmol/L (ref 135–145)

## 2018-05-20 LAB — PREPARE PLATELET PHERESIS: UNIT DIVISION: 0

## 2018-05-20 MED ORDER — MAGNESIUM CITRATE PO SOLN
0.5000 | Freq: Once | ORAL | Status: AC
  Administered 2018-05-20: 0.5 via ORAL
  Filled 2018-05-20: qty 296

## 2018-05-20 MED ORDER — GUAIFENESIN-DM 100-10 MG/5ML PO SYRP
10.0000 mL | ORAL_SOLUTION | Freq: Four times a day (QID) | ORAL | Status: DC | PRN
  Administered 2018-05-20: 10 mL via ORAL
  Filled 2018-05-20: qty 10

## 2018-05-20 MED ORDER — BOOST / RESOURCE BREEZE PO LIQD CUSTOM
1.0000 | Freq: Three times a day (TID) | ORAL | Status: DC
  Administered 2018-05-20 – 2018-05-21 (×3): 1 via ORAL

## 2018-05-20 NOTE — Progress Notes (Signed)
PROGRESS NOTE    Janet Fletcher  MVH:[id number] DOB: 04/17/1936 DOA: 05/18/2018 PCP: Chevis Pretty, FNP   Brief Narrative:   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  She has received a total of 3 units of PRBC transfusion as well as 1 unit of platelets yesterday.  Her hemoglobin has continuously shown improvement after transfusion and her platelet counts have increased as well.  Oncology following.  Patient continues to have dark bowel movements for which she will require repeat CBC monitoring until she is stable enough for discharge.  Assessment & Plan:   Principal Problem:   Pancytopenia ([place]) Active Problems:   Iron deficiency anemia   Anemia in chronic kidney disease (CKD)   Hypotension   1. Generalized weakness likely secondary to worsening pancytopenia in the setting of MDS.    Appreciate oncology consultation for assistance in management.  Dr. Delton Coombes will follow-up in the outpatient setting with plans for repeat bone marrow biopsy.  Weakness is improving and will plan to ambulate patient today.  Nutrition consultation for assistance and supplementation.  Plan to transfer to general medical floor today. 2. Worsening anemia likely secondary to acute blood loss anemia.  Patient continues to have black, tarry stools as well as FOBT that has been positive in the past.  Continue to check CBC in a.m. and ensure stability prior to discharge.  GI has seen patient in the past with recommendations for no further endoscopy at this time.  She has been given platelets on 10/29 with counts above 50,000 which should hopefully help stabilize her GI bleed.  Will administer laxation with magnesium citrate today as needed. 3. Small cell lung cancer.  Further  recommendations per oncology with outpatient follow-up as noted.   DVT prophylaxis: SCDs Code Status: DNR Family Communication: 4 daughters at bedside on 10/29 Disposition Plan:  Repeat CBC in a.m. to ensure stability of H&H.  Recheck BM today.  If stable in a.m., could consider discharge at that time.  Oncology following and will see outpatient.  Patient does have home health PT.   Consultants:   Oncology  Palliative care  Procedures:   None  Antimicrobials:   None  Subjective: Patient seen and evaluated today with no new acute complaints or concerns. No acute concerns or events noted overnight.  She did have another dark bowel movement yesterday, but states that she is feeling less weak today.  She tends not to have much of an appetite and has not had much to eat throughout the day yesterday.  Objective: Vitals:   05/20/18 0400 05/20/18 0420 05/20/18 0500 05/20/18 0600  BP: (!) 113/55  (!) 103/55 (!) 109/52  Pulse: 67  60 62  Resp: (!) 22  (!) 21 (!) 21  Temp:  98.3 F (36.8 C)    TempSrc:  Oral    SpO2: 93%  94% 93%  Weight:  63.7 kg    Height:        Intake/Output Summary (Last 24 hours) at 05/20/2018 0719 Last data filed at 05/20/2018 0443 Gross per 24 hour  Intake 1240.5 ml  Output 450 ml  Net 790.5 ml   Filed Weights   05/19/18 0138 05/20/18 0420  Weight: 62.2 kg 63.7 kg    Examination:  General exam: Appears calm and comfortable, generally appears weak and listless Respiratory  system: Clear to auscultation. Respiratory effort normal.  Currently on room air Cardiovascular system: S1 & S2 heard, RRR. No JVD, murmurs, rubs, gallops or clicks. No pedal edema. Gastrointestinal system: Abdomen is nondistended, soft and nontender. No organomegaly or masses felt. Normal bowel sounds heard. Central nervous system: Alert and oriented.  Extremities: Symmetric 5 x 5 power. Skin: No rashes, lesions or ulcers Psychiatry: Flat affect.    Data Reviewed:  I have personally reviewed following labs and imaging studies  CBC: Recent Labs  Lab 05/18/18 2210 05/19/18 0734 05/20/18 0423  WBC 1.0* 1.4* 1.3*  NEUTROABS 0.6*  --   --   HGB 5.2* 6.6* 7.8*  HCT 16.7* 20.5* 23.8*  MCV 93.3 89.1 91.2  PLT 15* 16* 51*   Basic Metabolic Panel: Recent Labs  Lab 05/18/18 2210 05/19/18 0734 05/20/18 0423  NA 140 142 142  K 4.2 4.1 3.7  CL 109 113* 111  CO2 '26 24 26  ' GLUCOSE 130* 101* 91  BUN 39* 33* 29*  CREATININE 1.38* 1.23* 1.09*  CALCIUM 8.6* 8.4* 8.4*   GFR: Estimated Creatinine Clearance: 37 mL/min (A) (by C-G formula based on SCr of 1.09 mg/dL (H)). Liver Function Tests: Recent Labs  Lab 05/18/18 2210 05/19/18 0734  AST 42* 40  ALT 32 31  ALKPHOS 49 53  BILITOT 1.1 1.7*  PROT 6.0* 5.7*  ALBUMIN 3.0* 2.9*   No results for input(s): LIPASE, AMYLASE in the last 168 hours. No results for input(s): AMMONIA in the last 168 hours. Coagulation Profile: No results for input(s): INR, PROTIME in the last 168 hours. Cardiac Enzymes: Recent Labs  Lab 05/19/18 0734 05/19/18 1302  TROPONINI <0.03 <0.03   BNP (last 3 results) No results for input(s): PROBNP in the last 8760 hours. HbA1C: No results for input(s): HGBA1C in the last 72 hours. CBG: No results for input(s): GLUCAP in the last 168 hours. Lipid Profile: No results for input(s): CHOL, HDL, LDLCALC, TRIG, CHOLHDL, LDLDIRECT in the last 72 hours. Thyroid Function Tests: No results for input(s): TSH, T4TOTAL, FREET4, T3FREE, THYROIDAB in the last 72 hours. Anemia Panel: No results for input(s): VITAMINB12, FOLATE, FERRITIN, TIBC, IRON, RETICCTPCT in the last 72 hours. Sepsis Labs: No results for input(s): PROCALCITON, LATICACIDVEN in the last 168 hours.  Recent Results (from the past 240 hour(s))  MRSA PCR Screening     Status: None   Collection Time: 05/19/18  1:24 AM  Result Value Ref Range Status   MRSA by PCR NEGATIVE NEGATIVE Final    Comment:        The  GeneXpert MRSA Assay (FDA approved for NASAL specimens only), is one component of a comprehensive MRSA colonization surveillance program. It is not intended to diagnose MRSA infection nor to guide or monitor treatment for MRSA infections. Performed at [facility], [address] [place], [place] [address]          Radiology Studies: Dg Chest 2 View  Result Date: 05/18/2018 CLINICAL DATA:  Weakness x4 days with cough. History of lung cancer. Former smoker. EXAM: CHEST - 2 VIEW COMPARISON:  Chest CT 04/28/2018, CXR 01/12/2018 FINDINGS: Cardiomegaly with mild aortic atherosclerosis. Port catheter tip terminates in the distal SVC. Mild bilateral hilar prominence similar to prior. No acute pulmonary consolidation, effusion or pneumothorax. IMPRESSION: Cardiomegaly with aortic atherosclerosis. No active pulmonary disease. Electronically Signed   By: Ashley Royalty M.D.   On: 05/18/2018 23:31   Ct Head Wo Contrast  Result Date: 05/19/2018 CLINICAL DATA:  82 year old  female with generalized weakness. EXAM: CT HEAD WITHOUT CONTRAST TECHNIQUE: Contiguous axial images were obtained from the base of the skull through the vertex without intravenous contrast. COMPARISON:  Brain MRI dated 08/22/2017 FINDINGS: Brain: There is mild age-related atrophy and moderate chronic microvascular ischemic changes. There is no acute intracranial hemorrhage. No mass effect or midline shift. No extra-axial fluid collection. Vascular: No hyperdense vessel or unexpected calcification. Skull: Normal. Negative for fracture or focal lesion. Sinuses/Orbits: No acute finding. Other: None IMPRESSION: 1. No acute intracranial hemorrhage. 2. Age related atrophy and chronic microvascular ischemic changes. Electronically Signed   By: Anner Crete M.D.   On: 05/19/2018 01:58        Scheduled Meds: . docusate sodium  100 mg Oral BID  . donepezil  10 mg Oral QHS  . feeding supplement (ENSURE ENLIVE)  237 mL Oral BID BM  .  magnesium citrate  0.5 Bottle Oral Once  . memantine  5 mg Oral BID  . sodium chloride flush  3 mL Intravenous Q12H  . umeclidinium-vilanterol  1 puff Inhalation Daily   Continuous Infusions: . sodium chloride       LOS: 1 day    Time spent: 30 minutes    Johannes Everage Darleen Crocker, DO [facility] Pager [phone]  If 7PM-7AM, please contact night-coverage [email or web address] Password [email or web address] 05/20/2018, 7:19 AM

## 2018-05-20 NOTE — Progress Notes (Signed)
Initial Nutrition Assessment  DOCUMENTATION CODES:  Not applicable  INTERVENTION:  Boost Breeze po TID, each supplement provides 250 kcal and 9 grams of protein  Magic cup TID with meals, each supplement provides 290 kcal and 9 grams of protein  NUTRITION DIAGNOSIS:  Increased nutrient needs related to cancer and cancer related treatments as evidenced by estimated nutrition requirements for this condition   GOAL:  Patient will meet greater than or equal to 90% of their needs  MONITOR:  PO intake, Supplement acceptance, Labs, Weight trends, I & O's  REASON FOR ASSESSMENT:  Consult Assessment of nutrition requirement/status  ASSESSMENT:  82 y/o female PMHx SCLC s/p chemo, developed therapy related MDS from [place] tx for which she is receiving chemo for, CKD. Brought to ED d/t lethargy and severe pallor. Found to have pancytopenia. Admitted for management  Pt herself is a poor historian d/t dementia and lethargy. Her spouse at bedside also cannot provide much information.   He says some days she eats well and some days she doesn't. Most often, pt only eats bites. Her changing tastes also present a challenge as the foods she likes one day she may not like the next. It sounds like the patient is receiving the vast majority of her nutrition via oral supplements. She typically drinks 2-3, but sometimes will drink 4.   Pt has not struggled with n/v/d and her constipation is well managed with supportive medications.   Weight wise, the patient appears to have actually gained 5 or so lbs over the past few months and 15-20 lbs in the last couple years. No signs of any recent weight loss.   Upon exiting, her daughter notes they are concerned the patient is "burnt out" on Ensure. Will try breeze/magic cup.   Labs: Pancytopenia, Albumin:2.9, BUN/Creat:29/1.09 Meds: Ensure Enlive BID-consuming Aricept, namenda, colace  Recent Labs  Lab 05/18/18 2210 05/19/18 0734 05/20/18 0423  NA 140 142  142  K 4.2 4.1 3.7  CL 109 113* 111  CO2 26 24 26   BUN 39* 33* 29*  CREATININE 1.38* 1.23* 1.09*  CALCIUM 8.6* 8.4* 8.4*  GLUCOSE 130* 101* 91   NUTRITION - FOCUSED PHYSICAL EXAM:   Most Recent Value  Orbital Region  No depletion  Upper Arm Region  No depletion  Thoracic and Lumbar Region  No depletion  Buccal Region  Mild depletion  Temple Region  Severe depletion  Clavicle Bone Region  Mild depletion  Clavicle and Acromion Bone Region  Mild depletion  Scapular Bone Region  Unable to assess  Dorsal Hand  No depletion  Anterior Thigh Region  Mild depletion  Posterior Calf Region  No depletion  Edema (RD Assessment)  None  Hair  Reviewed  Eyes  Reviewed  Mouth  Reviewed  Skin  Reviewed  Nails  Reviewed       Diet Order:   Diet Order            Diet regular Room service appropriate? Yes; Fluid consistency: Thin  Diet effective now             EDUCATION NEEDS:  No education needs have been identified at this time  Skin:  Skin Assessment: Reviewed RN Assessment  Last BM:  10/29  Height:  Ht Readings from Last 1 Encounters:  05/19/18 5\' 5"  (1.651 m)   Weight:  Wt Readings from Last 1 Encounters:  05/20/18 63.7 kg   Wt Readings from Last 10 Encounters:  05/20/18 63.7 kg  05/01/18 64 kg  04/27/18 62 kg  04/20/18 62.2 kg  03/31/18 63 kg  03/24/18 62.1 kg  03/16/18 62.2 kg  02/16/18 61.1 kg  02/09/18 61.4 kg  01/14/18 59.9 kg   Ideal Body Weight:  56.82 kg  BMI:  Body mass index is 23.37 kg/m.  Estimated Nutritional Needs:  Kcal:  1750-1950 kcals (28-31 kcal/kg bw) Protein:  81-93g (1.3-1.5 g/kg bw) Fluid:  >1.6 L fluid (25 ml/kg bw)  Burtis Junes RD, LDN, CNSC Clinical Nutrition Available Tues-Sat via Pager: [phone] 05/20/2018 10:25 AM

## 2018-05-21 LAB — CBC
HCT: 23.8 % — ABNORMAL LOW (ref 36.0–46.0)
HEMOGLOBIN: 7.7 g/dL — AB (ref 12.0–15.0)
MCH: 29.8 pg (ref 26.0–34.0)
MCHC: 32.4 g/dL (ref 30.0–36.0)
MCV: 92.2 fL (ref 80.0–100.0)
Platelets: 41 10*3/uL — ABNORMAL LOW (ref 150–400)
RBC: 2.58 MIL/uL — AB (ref 3.87–5.11)
RDW: 15.4 % (ref 11.5–15.5)
WBC: 1.2 10*3/uL — CL (ref 4.0–10.5)
nRBC: 6.5 % — ABNORMAL HIGH (ref 0.0–0.2)

## 2018-05-21 LAB — BASIC METABOLIC PANEL
Anion gap: 5 (ref 5–15)
BUN: 28 mg/dL — AB (ref 8–23)
CHLORIDE: 109 mmol/L (ref 98–111)
CO2: 26 mmol/L (ref 22–32)
CREATININE: 1.08 mg/dL — AB (ref 0.44–1.00)
Calcium: 8.4 mg/dL — ABNORMAL LOW (ref 8.9–10.3)
GFR calc Af Amer: 55 mL/min — ABNORMAL LOW (ref >60)
GFR calc non Af Amer: 47 mL/min — ABNORMAL LOW (ref >60)
GLUCOSE: 89 mg/dL (ref 70–99)
POTASSIUM: 4.1 mmol/L (ref 3.5–5.1)
SODIUM: 140 mmol/L (ref 135–145)

## 2018-05-21 MED ORDER — HEPARIN SOD (PORK) LOCK FLUSH 100 UNIT/ML IV SOLN
500.0000 [IU] | INTRAVENOUS | Status: AC | PRN
  Administered 2018-05-21: 500 [IU]
  Filled 2018-05-21: qty 5

## 2018-05-21 MED ORDER — ENSURE ENLIVE PO LIQD: 237.0000 mL | Freq: Two times a day (BID) | ORAL | 12 refills | Status: AC

## 2018-05-21 NOTE — Care Management Note (Signed)
Case Management Note  Patient Details  Name: Janet Fletcher MRN: [id number] Date of Birth: 1936-04-20  Subjective/Objective:    Pancytopenia.                Action/Plan: Discharging home with family and resumption of home health.   Expected Discharge Date:  05/21/18               Expected Discharge Plan:  [place]  In-House Referral:     Discharge planning Services  CM Consult  Post Acute Care Choice:  Home Health, Resumption of Svcs/PTA Provider Choice offered to:     DME Arranged:    DME Agency:     HH Arranged:    [place] Agency:  [facility]  Status of Service:  Completed, signed off  If discussed at [place] of Stay Meetings, dates discussed:    Additional Comments:  Janet Fletcher, [name], RN 05/21/2018, 12:24 PM

## 2018-05-21 NOTE — Care Management (Signed)
Patient discharging home today, Janet Fletcher of [facility] notified and will obtain orders via Epic.

## 2018-05-21 NOTE — Progress Notes (Signed)
CRITICAL VALUE ALERT  Critical Value:  WBC 1.2  Date & Time Notied:  05/21/2018 7116  Provider Notified: Manuella Ghazi, MD  Orders Received/Actions taken: no further instructions at this time

## 2018-05-21 NOTE — Progress Notes (Signed)
Waiting for anoro from pharmacy

## 2018-05-21 NOTE — Care Management Important Message (Signed)
Important Message  Patient Details  Name: Janet Fletcher MRN: [id number] Date of Birth: Aug 13, 1935   Medicare Important Message Given:  Yes    Cameo Shewell, [name], RN 05/21/2018, 12:26 PM

## 2018-05-21 NOTE — Discharge Summary (Addendum)
Physician Discharge Summary  Janet Fletcher KTG:[id number] DOB: 11/12/1935 DOA: 05/18/2018  PCP: Chevis Pretty, FNP  Admit date: 05/18/2018  Discharge date: 05/21/2018  Admitted From: Home  Disposition: Home  Recommendations for Outpatient Follow-up:  1. Follow up with PCP in 1-2 weeks 2. Follow-up with oncologist Dr. Delton Coombes as scheduled in office in early November  Home Health: Yes has home health PT  Equipment/Devices: None  Discharge Condition: Stable  CODE STATUS: Full  Diet recommendation: Heart Healthy  Brief/Interim Summary: 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 She has received a total of 3 units of PRBC transfusion as well as 1 unit of platelets yesterday.  Her hemoglobin has continuously shown improvement after transfusion and her platelet counts have increased as well.  Her oncologist Dr. Delton Coombes had also seen the patient and will follow up with her in the outpatient setting as previously scheduled with likely repeat bone marrow biopsy and further consideration for treatment versus set up for hospice care.  She is currently stable for discharge and has had no further bloody bowel movements and has a stable hemoglobin of 7.7.  She is feeling much less weak.  Platelet counts are 41,000 on day of discharge.  She will resume her home health physical therapy services as previously scheduled.  Discharge Diagnoses:  Principal Problem:   Pancytopenia ([place]) Active Problems:   Iron deficiency anemia   Anemia in chronic kidney disease (CKD)   Hypotension   Malignant neoplasm of lung (HCC)   MDS (myelodysplastic syndrome) ([place])   Palliative care encounter  Principal diagnosis: Generalized weakness secondary to severe  pancytopenia.  Discharge Instructions  Discharge Instructions    Diet - low sodium heart healthy   Complete by:  As directed    Increase activity slowly   Complete by:  As directed      Allergies as of 05/21/2018      Reactions   Flagyl [metronidazole Hcl] Nausea Only   Penicillins Other (See Comments)   Has patient had a PCN reaction causing immediate rash, facial/tongue/throat swelling, SOB or lightheadedness with hypotension: unknown Has patient had a PCN reaction causing severe rash involving mucus membranes or skin necrosis: unknown Has patient had a PCN reaction that required hospitalization unknown Has patient had a PCN reaction occurring within the last 10 years: unknown  If all of the above answers are "NO", then may proceed with Cephalosporin use.      Medication List    TAKE these medications   ANORO ELLIPTA 62.5-25 MCG/INH Aepb Generic drug:  umeclidinium-vilanterol Inhale 1 puff into the lungs daily.   cyanocobalamin 1000 MCG/ML injection Commonly known as:  (VITAMIN B-12) Inject 1 mL (1,000 mcg total) into the muscle every 30 (thirty) days.   docusate sodium 100 MG capsule Commonly known as:  COLACE Take 100 mg by mouth 2 (two) times daily.   donepezil 10 MG tablet Commonly known as:  ARICEPT Take 1 tablet (10 mg total) by mouth at bedtime.   feeding supplement (ENSURE ENLIVE) Liqd Take 237 mLs by mouth 2 (two) times daily between meals.   memantine 5 MG tablet Commonly known as:  NAMENDA Take 1 tablet (5 mg total) by mouth 2 (two) times daily.   ondansetron 8 MG tablet Commonly known as:  ZOFRAN Take 1 tablet (8 mg total) by mouth  2 (two) times daily. Start on the third day from the last day of chemotherapy   PROAIR HFA 108 (90 Base) MCG/ACT inhaler Generic drug:  albuterol Inhale 2 puffs into the lungs every 6 (six) hours as needed for wheezing or shortness of breath.   prochlorperazine 10 MG tablet Commonly known as:  COMPAZINE Take 1 tablet  (10 mg total) by mouth every 6 (six) hours as needed for nausea or vomiting.      Follow-up Information    Hassell Done, Mary-Margaret, FNP Follow up in 1 week(s).   Specialty:  Family Medicine Contact information: [address] [place] [address] [phone]          Allergies  Allergen Reactions  . Flagyl [Metronidazole Hcl] Nausea Only  . Penicillins Other (See Comments)    Has patient had a PCN reaction causing immediate rash, facial/tongue/throat swelling, SOB or lightheadedness with hypotension: unknown Has patient had a PCN reaction causing severe rash involving mucus membranes or skin necrosis: unknown Has patient had a PCN reaction that required hospitalization unknown Has patient had a PCN reaction occurring within the last 10 years: unknown  If all of the above answers are "NO", then may proceed with Cephalosporin use.    Consultations:  Oncology-Dr. Delton Coombes   Procedures/Studies: Dg Chest 2 View  Result Date: 05/18/2018 CLINICAL DATA:  Weakness x4 days with cough. History of lung cancer. Former smoker. EXAM: CHEST - 2 VIEW COMPARISON:  Chest CT 04/28/2018, CXR 01/12/2018 FINDINGS: Cardiomegaly with mild aortic atherosclerosis. Port catheter tip terminates in the distal SVC. Mild bilateral hilar prominence similar to prior. No acute pulmonary consolidation, effusion or pneumothorax. IMPRESSION: Cardiomegaly with aortic atherosclerosis. No active pulmonary disease. Electronically Signed   By: Ashley Royalty M.D.   On: 05/18/2018 23:31   Ct Head Wo Contrast  Result Date: 05/19/2018 CLINICAL DATA:  82 year old female with generalized weakness. EXAM: CT HEAD WITHOUT CONTRAST TECHNIQUE: Contiguous axial images were obtained from the base of the skull through the vertex without intravenous contrast. COMPARISON:  Brain MRI dated 08/22/2017 FINDINGS: Brain: There is mild age-related atrophy and moderate chronic microvascular ischemic changes. There is no acute  intracranial hemorrhage. No mass effect or midline shift. No extra-axial fluid collection. Vascular: No hyperdense vessel or unexpected calcification. Skull: Normal. Negative for fracture or focal lesion. Sinuses/Orbits: No acute finding. Other: None IMPRESSION: 1. No acute intracranial hemorrhage. 2. Age related atrophy and chronic microvascular ischemic changes. Electronically Signed   By: Anner Crete M.D.   On: 05/19/2018 01:58   Ct Chest W Contrast  Result Date: 04/29/2018 CLINICAL DATA:  Lung cancer, status post chemotherapy and radiation. History of mild dysplastic syndrome. Cough, weakness. EXAM: CT CHEST WITH CONTRAST TECHNIQUE: Multidetector CT imaging of the chest was performed during intravenous contrast administration. CONTRAST:  34m OMNIPAQUE IOHEXOL 300 MG/ML  SOLN COMPARISON:  Chest radiographs dated 01/12/2018. CT chest dated 07/02/2017. FINDINGS: Cardiovascular: The heart is normal in size. No pericardial effusion. No evidence of thoracic aortic aneurysm. Atherosclerotic calcifications of the aortic arch. Three vessel atherosclerosis. Right chest port terminates in the mid SVC. Mediastinum/Nodes: Small mediastinal lymph nodes, including an 8 mm short axis right paratracheal node (series 2/image 52) and a 7 mm short axis subcarinal node, unchanged. Visualized thyroid is unremarkable. Lungs/Pleura: Radiation changes in the right paramediastinal/perihilar region. Moderate centrilobular emphysematous changes, upper lobe predominant. Small right pleural effusion, new. Stable ground-glass nodules in the medial left upper lobe (series 4/image 34) and the lateral right upper lobe (series 4/images  57 and 58), grossly unchanged. No focal consolidation. No pneumothorax. Upper Abdomen: Visualized upper abdomen is mildly motion degraded but notable for stable thickening of the left adrenal gland without discrete nodule/mass. Musculoskeletal: Mild degenerative changes of the mid thoracic spine.  IMPRESSION: Radiation changes in the right lung. Small right pleural effusion, new/recurrent. Small mediastinal lymph nodes, measuring 7-8 mm short axis, chronic. Stable ground-glass nodules in the bilateral upper lobes. Aortic Atherosclerosis (ICD10-I70.0) and Emphysema (ICD10-J43.9). Electronically Signed   By: Julian Hy M.D.   On: 04/29/2018 09:07     Discharge Exam: Vitals:   05/20/18 2111 05/21/18 0517  BP: 116/60 (!) 100/52  Pulse: 73 75  Resp: 16 16  Temp: 98.4 F (36.9 C) 98.9 F (37.2 C)  SpO2: 96% 98%   Vitals:   05/20/18 1133 05/20/18 1950 05/20/18 2111 05/21/18 0517  BP:   116/60 (!) 100/52  Pulse:   73 75  Resp:   16 16  Temp: (!) 97.4 F (36.3 C)  98.4 F (36.9 C) 98.9 F (37.2 C)  TempSrc: Oral  Oral Oral  SpO2:  94% 96% 98%  Weight:    66.1 kg  Height:        General: Pt is alert, awake, not in acute distress Cardiovascular: RRR, S1/S2 +, no rubs, no gallops Respiratory: CTA bilaterally, no wheezing, no rhonchi Abdominal: Soft, NT, ND, bowel sounds + Extremities: no edema, no cyanosis    The results of significant diagnostics from this hospitalization (including imaging, microbiology, ancillary and laboratory) are listed below for reference.     Microbiology: Recent Results (from the past 240 hour(s))  MRSA PCR Screening     Status: None   Collection Time: 05/19/18  1:24 AM  Result Value Ref Range Status   MRSA by PCR NEGATIVE NEGATIVE Final    Comment:        The GeneXpert MRSA Assay (FDA approved for NASAL specimens only), is one component of a comprehensive MRSA colonization surveillance program. It is not intended to diagnose MRSA infection nor to guide or monitor treatment for MRSA infections. Performed at [facility], [address], [place], [place] [address]      Labs: BNP (last 3 results) No results for input(s): BNP in the last 8760 hours. Basic Metabolic Panel: Recent Labs  Lab 05/18/18 2210 05/19/18 0734  05/20/18 0423 05/21/18 0542  NA 140 142 142 140  K 4.2 4.1 3.7 4.1  CL 109 113* 111 109  CO2 '26 24 26 26  ' GLUCOSE 130* 101* 91 89  BUN 39* 33* 29* 28*  CREATININE 1.38* 1.23* 1.09* 1.08*  CALCIUM 8.6* 8.4* 8.4* 8.4*   Liver Function Tests: Recent Labs  Lab 05/18/18 2210 05/19/18 0734  AST 42* 40  ALT 32 31  ALKPHOS 49 53  BILITOT 1.1 1.7*  PROT 6.0* 5.7*  ALBUMIN 3.0* 2.9*   No results for input(s): LIPASE, AMYLASE in the last 168 hours. No results for input(s): AMMONIA in the last 168 hours. CBC: Recent Labs  Lab 05/18/18 2210 05/19/18 0734 05/20/18 0423 05/21/18 0542  WBC 1.0* 1.4* 1.3* 1.2*  NEUTROABS 0.6*  --   --   --   HGB 5.2* 6.6* 7.8* 7.7*  HCT 16.7* 20.5* 23.8* 23.8*  MCV 93.3 89.1 91.2 92.2  PLT 15* 16* 51* 41*   Cardiac Enzymes: Recent Labs  Lab 05/19/18 0734 05/19/18 1302  TROPONINI <0.03 <0.03   BNP: Invalid input(s): POCBNP CBG: No results for input(s): GLUCAP in the last 168  hours. D-Dimer No results for input(s): DDIMER in the last 72 hours. Hgb A1c No results for input(s): HGBA1C in the last 72 hours. Lipid Profile No results for input(s): CHOL, HDL, LDLCALC, TRIG, CHOLHDL, LDLDIRECT in the last 72 hours. Thyroid function studies No results for input(s): TSH, T4TOTAL, T3FREE, THYROIDAB in the last 72 hours.  Invalid input(s): FREET3 Anemia work up No results for input(s): VITAMINB12, FOLATE, FERRITIN, TIBC, IRON, RETICCTPCT in the last 72 hours. Urinalysis    Component Value Date/Time   COLORURINE YELLOW 05/18/2018 2227   APPEARANCEUR HAZY (A) 05/18/2018 2227   LABSPEC 1.017 05/18/2018 2227   PHURINE 5.0 05/18/2018 2227   GLUCOSEU NEGATIVE 05/18/2018 2227   HGBUR NEGATIVE 05/18/2018 2227   BILIRUBINUR NEGATIVE 05/18/2018 2227   BILIRUBINUR NEG 09/18/2015 1037   KETONESUR NEGATIVE 05/18/2018 2227   PROTEINUR NEGATIVE 05/18/2018 2227   UROBILINOGEN negative 09/18/2015 1037   NITRITE NEGATIVE 05/18/2018 2227   LEUKOCYTESUR  NEGATIVE 05/18/2018 2227   Sepsis Labs Invalid input(s): PROCALCITONIN,  WBC,  LACTICIDVEN Microbiology Recent Results (from the past 240 hour(s))  MRSA PCR Screening     Status: None   Collection Time: 05/19/18  1:24 AM  Result Value Ref Range Status   MRSA by PCR NEGATIVE NEGATIVE Final    Comment:        The GeneXpert MRSA Assay (FDA approved for NASAL specimens only), is one component of a comprehensive MRSA colonization surveillance program. It is not intended to diagnose MRSA infection nor to guide or monitor treatment for MRSA infections. Performed at [facility], [address] [place], [place] [address]      Time coordinating discharge: 35 minutes  SIGNED:   Rodena Goldmann, DO [facility] 05/21/2018, 11:08 AM Pager [phone]  If 7PM-7AM, please contact night-coverage [email or web address] Password [email or web address]

## 2018-05-25 ENCOUNTER — Other Ambulatory Visit: Payer: Self-pay

## 2018-05-25 ENCOUNTER — Inpatient Hospital Stay (HOSPITAL_COMMUNITY): Payer: Medicare Other | Attending: Hematology

## 2018-05-25 ENCOUNTER — Ambulatory Visit (HOSPITAL_COMMUNITY): Payer: Medicare Other

## 2018-05-25 ENCOUNTER — Encounter (HOSPITAL_COMMUNITY): Payer: Self-pay | Admitting: Hematology

## 2018-05-25 ENCOUNTER — Inpatient Hospital Stay (HOSPITAL_COMMUNITY): Payer: Medicare Other | Admitting: Hematology

## 2018-05-25 VITALS — BP 116/54 | HR 81 | Resp 18 | Wt 140.5 lb

## 2018-05-25 DIAGNOSIS — R0609 Other forms of dyspnea: Secondary | ICD-10-CM

## 2018-05-25 DIAGNOSIS — R233 Spontaneous ecchymoses: Secondary | ICD-10-CM | POA: Insufficient documentation

## 2018-05-25 DIAGNOSIS — R5383 Other fatigue: Secondary | ICD-10-CM

## 2018-05-25 DIAGNOSIS — L89309 Pressure ulcer of unspecified buttock, unspecified stage: Secondary | ICD-10-CM | POA: Insufficient documentation

## 2018-05-25 DIAGNOSIS — R42 Dizziness and giddiness: Secondary | ICD-10-CM

## 2018-05-25 DIAGNOSIS — C3491 Malignant neoplasm of unspecified part of right bronchus or lung: Secondary | ICD-10-CM

## 2018-05-25 DIAGNOSIS — D46Z Other myelodysplastic syndromes: Secondary | ICD-10-CM | POA: Insufficient documentation

## 2018-05-25 DIAGNOSIS — R630 Anorexia: Secondary | ICD-10-CM | POA: Diagnosis not present

## 2018-05-25 DIAGNOSIS — R634 Abnormal weight loss: Secondary | ICD-10-CM

## 2018-05-25 DIAGNOSIS — F329 Major depressive disorder, single episode, unspecified: Secondary | ICD-10-CM | POA: Diagnosis not present

## 2018-05-25 DIAGNOSIS — Z87891 Personal history of nicotine dependence: Secondary | ICD-10-CM | POA: Diagnosis not present

## 2018-05-25 DIAGNOSIS — Z79899 Other long term (current) drug therapy: Secondary | ICD-10-CM | POA: Diagnosis not present

## 2018-05-25 DIAGNOSIS — F0390 Unspecified dementia without behavioral disturbance: Secondary | ICD-10-CM | POA: Insufficient documentation

## 2018-05-25 LAB — CBC WITH DIFFERENTIAL/PLATELET
Abs Immature Granulocytes: 0.01 10*3/uL (ref 0.00–0.07)
BASOS PCT: 1 %
Basophils Absolute: 0 10*3/uL (ref 0.0–0.1)
EOS ABS: 0 10*3/uL (ref 0.0–0.5)
EOS PCT: 1 %
HEMATOCRIT: 26.1 % — AB (ref 36.0–46.0)
Hemoglobin: 8.2 g/dL — ABNORMAL LOW (ref 12.0–15.0)
IMMATURE GRANULOCYTES: 1 %
LYMPHS ABS: 0.4 10*3/uL — AB (ref 0.7–4.0)
Lymphocytes Relative: 32 %
MCH: 29.4 pg (ref 26.0–34.0)
MCHC: 31.4 g/dL (ref 30.0–36.0)
MCV: 93.5 fL (ref 80.0–100.0)
Monocytes Absolute: 0.2 10*3/uL (ref 0.1–1.0)
Monocytes Relative: 15 %
NEUTROS PCT: 50 %
Neutro Abs: 0.7 10*3/uL — ABNORMAL LOW (ref 1.7–7.7)
Platelets: 27 10*3/uL — CL (ref 150–400)
RBC: 2.79 MIL/uL — ABNORMAL LOW (ref 3.87–5.11)
RDW: 14.4 % (ref 11.5–15.5)
WBC: 1.3 10*3/uL — CL (ref 4.0–10.5)
nRBC: 3.8 % — ABNORMAL HIGH (ref 0.0–0.2)

## 2018-05-25 LAB — COMPREHENSIVE METABOLIC PANEL
ALBUMIN: 3.3 g/dL — AB (ref 3.5–5.0)
ALT: 30 U/L (ref 0–44)
AST: 33 U/L (ref 15–41)
Alkaline Phosphatase: 59 U/L (ref 38–126)
Anion gap: 7 (ref 5–15)
BILIRUBIN TOTAL: 0.8 mg/dL (ref 0.3–1.2)
BUN: 31 mg/dL — AB (ref 8–23)
CO2: 27 mmol/L (ref 22–32)
Calcium: 9 mg/dL (ref 8.9–10.3)
Chloride: 105 mmol/L (ref 98–111)
Creatinine, Ser: 1.11 mg/dL — ABNORMAL HIGH (ref 0.44–1.00)
GFR calc Af Amer: 53 mL/min — ABNORMAL LOW (ref >60)
GFR calc non Af Amer: 46 mL/min — ABNORMAL LOW (ref >60)
GLUCOSE: 97 mg/dL (ref 70–99)
Potassium: 4.2 mmol/L (ref 3.5–5.1)
Sodium: 139 mmol/L (ref 135–145)
TOTAL PROTEIN: 6.2 g/dL — AB (ref 6.5–8.1)

## 2018-05-25 LAB — LACTATE DEHYDROGENASE: LDH: 454 U/L — ABNORMAL HIGH (ref 98–192)

## 2018-05-25 LAB — SAMPLE TO BLOOD BANK

## 2018-05-25 NOTE — Patient Instructions (Signed)
[facility] at [facility] Discharge Instructions     Thank you for choosing [facility] at [facility] to provide your oncology and hematology care.  To afford each patient quality time with our provider, please arrive at least 15 minutes before your scheduled appointment time.   If you have a lab appointment with the Cancer Center please come in thru the  Main Entrance and check in at the main information desk  You need to re-schedule your appointment should you arrive 10 or more minutes late.  We strive to give you quality time with our providers, and arriving late affects you and other patients whose appointments are after yours.  Also, if you no show three or more times for appointments you may be dismissed from the clinic at the providers discretion.     Again, thank you for choosing [facility].  Our hope is that these requests will decrease the amount of time that you wait before being seen by our physicians.       _____________________________________________________________  Should you have questions after your visit to [facility], please contact our office at [phone] between the hours of 8:00 a.m. and 4:30 p.m.  Voicemails left after 4:00 p.m. will not be returned until the following business day.  For prescription refill requests, have your pharmacy contact our office and allow 72 hours.    Cancer Center Support Programs:   > Cancer Support Group  2nd Tuesday of the month 1pm-2pm, Journey Room    

## 2018-05-25 NOTE — Progress Notes (Signed)
Janet Fletcher, Janet Fletcher   CLINIC:  Medical Oncology/Hematology  PCP:  Chevis Pretty, [place] [address] [place] [id number] [phone]   REASON FOR VISIT: Follow-up for high grade MDS  CURRENT THERAPY: Azacitidine every 28 days  BRIEF ONCOLOGIC HISTORY:    Small cell carcinoma of right lung ([facility])   11/28/2015 Imaging    Large anterior mediastinal mass in continuity with a R hilar mass, narrowing of SVC, abnormal densities in RUL, 9 mm spiculated nodule, nonspec. hypodensity in liver    12/05/2015 PET scan    Large hypermetabolic paratracheal mass c/w SCLC, perihilar nodular densities in RML, mild metabolic activity RUL nodule, no distant metastatic disease    12/11/2015 Procedure    Video bronch with biopsies and brushings, endobronchial ultrasound with mediastinal LN aspiration. Dr. Roxan Hockey    12/11/2015 Pathology Results    Trachea biopsy negative, FNA RUL malignant cells c/w SCLC    12/13/2015 - 03/15/2016 Chemotherapy    Cisplatin/Etoposide x 5 cycles.  Cycle #6 cancelled due to significant cytopenias    12/25/2015 Imaging    MRI brain- No intracranial parenchymal enhancing lesion. Tiny right frontal calvarial enhancing lesion. Small metastatic lesion not excluded although this may represent an incidentally detected benign process.    12/29/2015 - 02/13/2016 Radiation Therapy    XRT    02/14/2016 Treatment Plan Change    Chemotherapy deferred x 1 week    02/21/2016 Treatment Plan Change    Carboplatin dose reduced by 20% and Etoposide dose reduced by 10%.    04/24/2016 Imaging    CT CAP- Chest Impression:  1. Marked reduction in mediastinal and RIGHT hilar lymphadenopathy with no residual pathologically enlarged nodes. 2. Resolution of perihilar nodularity in the RIGHT lung which was hypermetabolic on FDG PET. 3. Persistent nodule in the RIGHT upper lobe which is not hypermetabolic. 4. New ground-glass nodule  in the RIGHT upper lobe is nonspecific and may relate to therapy.  Abdomen / Pelvis Impression:  1. No evidence of metastatic disease in the abdomen pelvis. 2. Atherosclerotic calcification of the aorta.    04/24/2016 Imaging    Bone scan- Nonspecific increased tracer localization at approximately T11 vertebral body; this is of uncertain etiology, could represent a degenerative process though metastatic disease is not excluded.    04/29/2016 -  Radiation Therapy    PCI -- 25 Gy    07/24/2016 Imaging    CT CAP- 1. There has been interval decrease in size of mediastinal lymph nodes. The index lesion within the right upper lobe is also decreased in size from previous exam. 2. New subpleural nodule is noted within the posteromedial right lower lobe. Attention on follow-up imaging advised. 3. Stable ground-glass attenuating nodule within the right lower lobe and anteromedial left upper lobe 4. Aortic atherosclerosis and coronary artery calcification    07/29/2016 Imaging    MRI t-spine: 1. Stable benign slightly enhancing sclerotic lesion in the right posterolateral aspect of the T11 vertebral body, unchanged for 5 years. This is felt to account for the subtle increased activity at T11 on the SPECT bone scan. 2. Slight degenerative disc disease with small disc protrusions at T1-2 and T2-3 and T12-L1 with no neural impingement. 3. No evidence of metastatic disease.    09/23/2016 Imaging    CT chest- Radiation changes in the central right upper lobe/ perihilar region.  Stable small mediastinal lymph nodes, unchanged.  No findings suspicious for new/progressive metastatic disease.  12/25/2016 Imaging    CT C/A/P: IMPRESSION: 1. Continued further decrease in mediastinal lymph nodes. 2. Index nodule antero medial right upper lobe has decreased. The new 5 mm right lower lobe subpleural nodule seen on the previous study has resolved in the interval. Ground-glass nodules in the upper  lobes bilaterally are stable. 3. Interval development of interstitial and alveolar opacity in the parahilar right lung is presumably radiation related. Attention on follow-up recommended. 4. New tiny right pleural effusion with areas of apparent pleural enhancement in the posterior right costophrenic sulcus. Close attention on follow-up recommended. 5. Marked abdominal aortic atherosclerosis with likely infrarenal significant stenosis.     07/02/2017 Imaging    CT Chest w/ contrast: Resolution of central right lower lobe infiltrate and right pleural effusion since prior study.  Stable ground-glass opacities in both upper lobes.  No new or progressive disease    11/16/2017 -  Chemotherapy    The patient had palonosetron (ALOXI) injection 0.25 mg, 0.25 mg, Intravenous,  Once, 6 of 7 cycles Administration: 0.25 mg (11/17/2017), 0.25 mg (11/19/2017), 0.25 mg (11/21/2017), 0.25 mg (01/12/2018), 0.25 mg (01/14/2018), 0.25 mg (01/16/2018), 0.25 mg (12/16/2017), 0.25 mg (12/18/2017), 0.25 mg (02/16/2018), 0.25 mg (02/18/2018), 0.25 mg (02/20/2018), 0.25 mg (03/24/2018), 0.25 mg (03/26/2018), 0.25 mg (04/27/2018), 0.25 mg (04/29/2018), 0.25 mg (05/01/2018) azaCITIDine (VIDAZA) 130 mg in sodium chloride 0.9 % 50 mL chemo infusion, 75 mg/m2 = 130 mg, Intravenous, Once, 6 of 7 cycles Dose modification: 67.5 mg/m2 (90 % of original dose 75 mg/m2, Cycle 4, Reason: Provider Judgment, Comment: prolonged cytopenias) Administration: 130 mg (11/17/2017), 130 mg (11/18/2017), 130 mg (11/19/2017), 130 mg (11/20/2017), 130 mg (11/21/2017), 130 mg (12/16/2017), 130 mg (12/17/2017), 130 mg (12/18/2017), 130 mg (12/19/2017), 130 mg (01/12/2018), 130 mg (01/13/2018), 130 mg (01/14/2018), 130 mg (01/15/2018), 130 mg (01/16/2018), 115 mg (02/16/2018), 115 mg (02/17/2018), 115 mg (02/18/2018), 115 mg (02/19/2018), 115 mg (02/20/2018), 130 mg (03/24/2018), 130 mg (03/25/2018), 130 mg (03/26/2018), 130 mg (03/27/2018), 115 mg (04/27/2018), 115 mg (04/28/2018), 115 mg  (04/29/2018), 115 mg (04/30/2018), 115 mg (05/01/2018)  for chemotherapy treatment.      MDS (myelodysplastic syndrome), high grade (HCC)   10/20/2017 Initial Diagnosis    MDS (myelodysplastic syndrome), high grade ([facility])    11/16/2017 -  Chemotherapy    The patient had palonosetron (ALOXI) injection 0.25 mg, 0.25 mg, Intravenous,  Once, 6 of 7 cycles Administration: 0.25 mg (11/17/2017), 0.25 mg (11/19/2017), 0.25 mg (11/21/2017), 0.25 mg (01/12/2018), 0.25 mg (01/14/2018), 0.25 mg (01/16/2018), 0.25 mg (12/16/2017), 0.25 mg (12/18/2017), 0.25 mg (02/16/2018), 0.25 mg (02/18/2018), 0.25 mg (02/20/2018), 0.25 mg (03/24/2018), 0.25 mg (03/26/2018), 0.25 mg (04/27/2018), 0.25 mg (04/29/2018), 0.25 mg (05/01/2018) azaCITIDine (VIDAZA) 130 mg in sodium chloride 0.9 % 50 mL chemo infusion, 75 mg/m2 = 130 mg, Intravenous, Once, 6 of 7 cycles Dose modification: 67.5 mg/m2 (90 % of original dose 75 mg/m2, Cycle 4, Reason: Provider Judgment, Comment: prolonged cytopenias) Administration: 130 mg (11/17/2017), 130 mg (11/18/2017), 130 mg (11/19/2017), 130 mg (11/20/2017), 130 mg (11/21/2017), 130 mg (12/16/2017), 130 mg (12/17/2017), 130 mg (12/18/2017), 130 mg (12/19/2017), 130 mg (01/12/2018), 130 mg (01/13/2018), 130 mg (01/14/2018), 130 mg (01/15/2018), 130 mg (01/16/2018), 115 mg (02/16/2018), 115 mg (02/17/2018), 115 mg (02/18/2018), 115 mg (02/19/2018), 115 mg (02/20/2018), 130 mg (03/24/2018), 130 mg (03/25/2018), 130 mg (03/26/2018), 130 mg (03/27/2018), 115 mg (04/27/2018), 115 mg (04/28/2018), 115 mg (04/29/2018), 115 mg (04/30/2018), 115 mg (05/01/2018)  for chemotherapy treatment.        INTERVAL  HISTORY:  Janet Fletcher 82 y.o. female returns for routine follow-up for . She is here today with her daughter. She is still dizzy when standing since being discharged from the hospital. She isn't eating much solid foods. She is drinking 4 cans of ensure daily to help maintain her weight. She denies any more dark stools or blood in her stools. She is still very  fatigued since being discharged and is SOB with exertion. She denies any nausea, vomiting, or diarrhea. Denies any skin rashes or mouth sores. She reports her appetite and energy level at 50%. She does report depression.     REVIEW OF SYSTEMS:  Review of Systems  Constitutional: Positive for fatigue.  Respiratory: Positive for shortness of breath.   Neurological: Positive for dizziness and extremity weakness.  All other systems reviewed and are negative.    PAST MEDICAL/SURGICAL HISTORY:  Past Medical History:  Diagnosis Date  . Allergy   . Cancer ([facility])    lung  . Hyperlipidemia   . Iron deficiency anemia 12/15/2015  . Low vitamin B12 level 12/15/2015  . Osteopenia    Past Surgical History:  Procedure Laterality Date  . CATARACT EXTRACTION, BILATERAL Bilateral   . COLONOSCOPY N/A 12/06/2016   Procedure: COLONOSCOPY;  Surgeon: Danie Binder, MD;  Location: [facility];  Service: Endoscopy;  Laterality: N/A;  1:45pm  . ESOPHAGOGASTRODUODENOSCOPY N/A 12/06/2016   Procedure: ESOPHAGOGASTRODUODENOSCOPY (EGD);  Surgeon: Danie Binder, MD;  Location: [facility];  Service: Endoscopy;  Laterality: N/A;  . MINOR EXCISION EAR CANAL CYST Left    30+ years ago  . SMALL INTESTINE SURGERY    . VIDEO BRONCHOSCOPY WITH ENDOBRONCHIAL ULTRASOUND N/A 12/11/2015   Procedure: VIDEO BRONCHOSCOPY WITH ENDOBRONCHIAL ULTRASOUND;  Surgeon: Melrose Nakayama, MD;  Location: [facility];  Service: Thoracic;  Laterality: N/A;     SOCIAL HISTORY:  Social History   Socioeconomic History  . Marital status: Married    Spouse name: Not on file  . Number of children: Not on file  . Years of education: Not on file  . Highest education level: Not on file  Occupational History  . Not on file  Social Needs  . Financial resource strain: Not on file  . Food insecurity:    Worry: Not on file    Inability: Not on file  . Transportation needs:    Medical: Not on file    Non-medical: Not on file    Tobacco Use  . Smoking status: Former Smoker    Packs/day: 0.25    Years: 50.00    Pack years: 12.50    Last attempt to quit: 01/08/2016    Years since quitting: 2.3  . Smokeless tobacco: Never Used  Substance and Sexual Activity  . Alcohol use: No  . Drug use: No  . Sexual activity: Not Currently  Lifestyle  . Physical activity:    Days per week: Not on file    Minutes per session: Not on file  . Stress: Not on file  Relationships  . Social connections:    Talks on phone: Not on file    Gets together: Not on file    Attends religious service: Not on file    Active member of club or organization: Not on file    Attends meetings of clubs or organizations: Not on file    Relationship status: Not on file  . Intimate partner violence:    Fear of current or ex partner: Not on file  Emotionally abused: Not on file    Physically abused: Not on file    Forced sexual activity: Not on file  Other Topics Concern  . Not on file  Social History Narrative  . Not on file    FAMILY HISTORY:  Family History  Problem Relation Age of Onset  . Cancer Mother   . Hypertension Father   . Colon cancer Neg Hx     CURRENT MEDICATIONS:  Outpatient Encounter Medications as of 05/25/2018  Medication Sig Note  . ANORO ELLIPTA 62.5-25 MCG/INH AEPB Inhale 1 puff into the lungs daily.    . cyanocobalamin (,VITAMIN B-12,) 1000 MCG/ML injection Inject 1 mL (1,000 mcg total) into the muscle every 30 (thirty) days. 05/18/2018: 04/21/2018  . docusate sodium (COLACE) 100 MG capsule Take 100 mg by mouth 2 (two) times daily.    Marland Kitchen donepezil (ARICEPT) 10 MG tablet Take 1 tablet (10 mg total) by mouth at bedtime.   . feeding supplement, ENSURE ENLIVE, (ENSURE ENLIVE) LIQD Take 237 mLs by mouth 2 (two) times daily between meals.   . memantine (NAMENDA) 5 MG tablet Take 1 tablet (5 mg total) by mouth 2 (two) times daily.   . ondansetron (ZOFRAN) 8 MG tablet Take 1 tablet (8 mg total) by mouth 2 (two) times  daily. Start on the third day from the last day of chemotherapy   . PROAIR HFA 108 (90 Base) MCG/ACT inhaler Inhale 2 puffs into the lungs every 6 (six) hours as needed for wheezing or shortness of breath.    . prochlorperazine (COMPAZINE) 10 MG tablet Take 1 tablet (10 mg total) by mouth every 6 (six) hours as needed for nausea or vomiting.    Facility-Administered Encounter Medications as of 05/25/2018  Medication  . sodium chloride flush (NS) 0.9 % injection 10 mL    ALLERGIES:  Allergies  Allergen Reactions  . Flagyl [Metronidazole Hcl] Nausea Only  . Penicillins Other (See Comments)    Has patient had a PCN reaction causing immediate rash, facial/tongue/throat swelling, SOB or lightheadedness with hypotension: unknown Has patient had a PCN reaction causing severe rash involving mucus membranes or skin necrosis: unknown Has patient had a PCN reaction that required hospitalization unknown Has patient had a PCN reaction occurring within the last 10 years: unknown  If all of the above answers are "NO", then may proceed with Cephalosporin use.     PHYSICAL EXAM:  ECOG Performance status: 1  Vitals:   05/25/18 1057  BP: (!) 116/54  Pulse: 81  Resp: 18  SpO2: 99%   Filed Weights   05/25/18 1057  Weight: 140 lb 8 oz (63.7 kg)    Physical Exam  Constitutional: She is oriented to person, place, and time. She appears well-developed and well-nourished.  Abdominal: Soft.  Musculoskeletal: Normal range of motion.  Neurological: She is alert and oriented to person, place, and time.  Skin: Skin is warm and dry.  Psychiatric: She has a normal mood and affect. Her behavior is normal. Judgment and thought content normal.  Abdomen: Soft nontender with no palpable abnormality.  Extremities: No edema or cyanosis.   LABORATORY DATA:  I have reviewed the labs as listed.  CBC    Component Value Date/Time   WBC 1.3 (LL) 05/25/2018 0943   RBC 2.79 (L) 05/25/2018 0943   HGB 8.2 (L)  05/25/2018 0943   HCT 26.1 (L) 05/25/2018 0943   PLT 27 (LL) 05/25/2018 0943   MCV 93.5 05/25/2018 0943   MCV 71.9 (  A) 08/20/2013 1559   MCH 29.4 05/25/2018 0943   MCHC 31.4 05/25/2018 0943   RDW 14.4 05/25/2018 0943   LYMPHSABS 0.4 (L) 05/25/2018 0943   MONOABS 0.2 05/25/2018 0943   EOSABS 0.0 05/25/2018 0943   BASOSABS 0.0 05/25/2018 0943   CMP Latest Ref Rng & Units 05/25/2018 05/21/2018 05/20/2018  Glucose 70 - 99 mg/dL 97 89 91  BUN 8 - 23 mg/dL 31(H) 28(H) 29(H)  Creatinine 0.44 - 1.00 mg/dL 1.11(H) 1.08(H) 1.09(H)  Sodium 135 - 145 mmol/L 139 140 142  Potassium 3.5 - 5.1 mmol/L 4.2 4.1 3.7  Chloride 98 - 111 mmol/L 105 109 111  CO2 22 - 32 mmol/L _0 Calcium 8.9 - 10.3 mg/dL 9.0 8.4(L) 8.4(L)  Total Protein 6.5 - 8.1 g/dL 6.2(L) - -  Total Bilirubin 0.3 - 1.2 mg/dL 0.8 - -  Alkaline Phos 38 - 126 U/L 59 - -  AST 15 - 41 U/L 33 - -  ALT 0 - 44 U/L 30 - -        ASSESSMENT & PLAN:   MDS (myelodysplastic syndrome), high grade (HCC) 1. 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  Megakaryocytes are increased with frequent small, hyperlobulated and hyperchromatic forms.  Granulocytic precursors have no significant dysplasia. -Chromosome analysis shows 17, XX, del(3)(q21q29),add(7)(p15),del(13)(q12q14)[6]/46,XX[14] -FISH results show 5 q-, loss of chromosome 7, and trisomy 8. -She received a total of 5 points based on revised IPSS system, with a median survival in the absence of therapy of 1.6 years, 25% AML progression in the absence of therapy and 1.4 years.  She has received chemotherapy with carboplatin and etoposide in 2017.  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  She has been receiving Aranesp for more than a year, dose increased to 200 mcg every 2 weeks  in December 2019.  We have given Aranesp at last visit a week ago.  She completely lost response to erythropoiesis stimulating agents. - Azacitidine 75 mg/m x 5 days every 28 days cycle 1 started on 11/17/2017 - Cycle 3 of azacitidine on 01/12/2018. - Cycle 4 was on 02/16/2018. Cycle 5 was on 03/24/2018. - She completed cycle 6 on 04/27/2018. -She was admitted to the hospital on 05/10/2018 with a hemoglobin of 5, melena consistent with GI bleed, and required 3 units of blood transfusion.  She was also very weak at that time.  Her platelet count was 15.  She also received platelet transfusion to decrease the bleed. - Today her hemoglobin is 8.2.  She feels better in terms of energy.  I have recommended doing a bone marrow aspiration and biopsy prior to continuing any further therapy.  We will see her back after the bone marrow biopsy.  We will continue to monitor her blood counts on a weekly basis to see if she needs transfusions.  2.  Weight loss: She is drinking 4 cans of boost per day.  She is also eating minimally.  3.  Small cell lung cancer: She completed chemoradiation therapy around July 2017.  Last CT of the chest was done in December 2018.  We will order CT scan of her chest with contrast for follow-up.      Orders placed this encounter:  Orders Placed This Encounter  Procedures  . CT BONE MARROW BIOPSY & ASPIRATION  .  CT BIOPSY  . Lactate dehydrogenase  . CBC with Differential/Platelet  . Comprehensive metabolic panel      Derek Jack, MD [place] [phone]

## 2018-05-25 NOTE — Assessment & Plan Note (Signed)
1. 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  Megakaryocytes are increased with frequent small, hyperlobulated and hyperchromatic forms.  Granulocytic precursors have no significant dysplasia. -Chromosome analysis shows 21, XX, del(3)(q21q29),add(7)(p15),del(13)(q12q14)[6]/46,XX[14] -FISH results show 5 q-, loss of chromosome 7, and trisomy 8. -She received a total of 5 points based on revised IPSS system, with a median survival in the absence of therapy of 1.6 years, 25% AML progression in the absence of therapy and 1.4 years.  She has received chemotherapy with carboplatin and etoposide in 2017.  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  She has been receiving Aranesp for more than a year, dose increased to 200 mcg every 2 weeks in December 2019.  We have given Aranesp at last visit a week ago.  She completely lost response to erythropoiesis stimulating agents. - Azacitidine 75 mg/m x 5 days every 28 days cycle 1 started on 11/17/2017 - Cycle 3 of azacitidine on 01/12/2018. - Cycle 4 was on 02/16/2018. Cycle 5 was on 03/24/2018. - She completed cycle 6 on 04/27/2018. -She was admitted to the hospital on 05/10/2018 with a hemoglobin of 5, melena consistent with GI bleed, and required 3 units of blood transfusion.  She was also very weak at that time.  Her platelet count was 15.  She also received platelet transfusion to decrease the bleed. - Today her hemoglobin is 8.2.  She feels better in terms of energy.  I have recommended doing a bone marrow aspiration and biopsy prior to continuing any further therapy.  We will see her back after the bone marrow biopsy.  We will continue to monitor her blood counts on a weekly basis to see if she needs transfusions.  2.  Weight loss: She is drinking 4 cans of  boost per day.  She is also eating minimally.  3.  Small cell lung cancer: She completed chemoradiation therapy around July 2017.  Last CT of the chest was done in December 2018.  We will order CT scan of her chest with contrast for follow-up.

## 2018-05-25 NOTE — Progress Notes (Signed)
CRITICAL VALUE ALERT Critical value received:  WBC 1.3,  platelets 27 Date of notification:  05-25-2018 Time of notification: 3794 Critical value read back:  Yes.   Nurse who received alert:  C.Page RN MD notified (1st page):  Dr. Delton Coombes

## 2018-05-26 ENCOUNTER — Ambulatory Visit (HOSPITAL_COMMUNITY): Payer: Medicare Other

## 2018-05-27 ENCOUNTER — Ambulatory Visit (HOSPITAL_COMMUNITY): Payer: Medicare Other

## 2018-05-28 ENCOUNTER — Other Ambulatory Visit: Payer: Self-pay | Admitting: Physician Assistant

## 2018-05-28 ENCOUNTER — Ambulatory Visit (HOSPITAL_COMMUNITY): Payer: Medicare Other

## 2018-05-29 ENCOUNTER — Ambulatory Visit (HOSPITAL_COMMUNITY)
Admission: RE | Admit: 2018-05-29 | Discharge: 2018-05-29 | Disposition: A | Discharge status: Home / Self Care | Payer: Medicare Other | Source: Ambulatory Visit | Attending: Nurse Practitioner | Admitting: Nurse Practitioner

## 2018-05-29 ENCOUNTER — Ambulatory Visit (HOSPITAL_COMMUNITY): Payer: Medicare Other

## 2018-05-29 ENCOUNTER — Encounter (HOSPITAL_COMMUNITY): Payer: Self-pay

## 2018-05-29 DIAGNOSIS — Z85118 Personal history of other malignant neoplasm of bronchus and lung: Secondary | ICD-10-CM | POA: Insufficient documentation

## 2018-05-29 DIAGNOSIS — M8580 Other specified disorders of bone density and structure, unspecified site: Secondary | ICD-10-CM | POA: Diagnosis not present

## 2018-05-29 DIAGNOSIS — Z880 Allergy status to penicillin: Secondary | ICD-10-CM | POA: Diagnosis not present

## 2018-05-29 DIAGNOSIS — C9200 Acute myeloblastic leukemia, not having achieved remission: Secondary | ICD-10-CM | POA: Diagnosis not present

## 2018-05-29 DIAGNOSIS — D46Z Other myelodysplastic syndromes: Secondary | ICD-10-CM

## 2018-05-29 DIAGNOSIS — Z881 Allergy status to other antibiotic agents status: Secondary | ICD-10-CM | POA: Insufficient documentation

## 2018-05-29 DIAGNOSIS — R0609 Other forms of dyspnea: Secondary | ICD-10-CM | POA: Diagnosis not present

## 2018-05-29 DIAGNOSIS — R531 Weakness: Secondary | ICD-10-CM | POA: Insufficient documentation

## 2018-05-29 DIAGNOSIS — D61818 Other pancytopenia: Secondary | ICD-10-CM | POA: Diagnosis not present

## 2018-05-29 DIAGNOSIS — D509 Iron deficiency anemia, unspecified: Secondary | ICD-10-CM | POA: Insufficient documentation

## 2018-05-29 DIAGNOSIS — E785 Hyperlipidemia, unspecified: Secondary | ICD-10-CM | POA: Insufficient documentation

## 2018-05-29 DIAGNOSIS — Z79899 Other long term (current) drug therapy: Secondary | ICD-10-CM | POA: Diagnosis not present

## 2018-05-29 LAB — CBC WITH DIFFERENTIAL/PLATELET
ABS IMMATURE GRANULOCYTES: 0.01 10*3/uL (ref 0.00–0.07)
Basophils Absolute: 0 10*3/uL (ref 0.0–0.1)
Basophils Relative: 0 %
Eosinophils Absolute: 0 10*3/uL (ref 0.0–0.5)
Eosinophils Relative: 0 %
HCT: 21 % — ABNORMAL LOW (ref 36.0–46.0)
Hemoglobin: 6.6 g/dL — CL (ref 12.0–15.0)
IMMATURE GRANULOCYTES: 0 %
LYMPHS PCT: 15 %
Lymphs Abs: 0.4 10*3/uL — ABNORMAL LOW (ref 0.7–4.0)
MCH: 30.1 pg (ref 26.0–34.0)
MCHC: 31.4 g/dL (ref 30.0–36.0)
MCV: 95.9 fL (ref 80.0–100.0)
MONO ABS: 0.2 10*3/uL (ref 0.1–1.0)
Monocytes Relative: 9 %
NEUTROS ABS: 1.8 10*3/uL (ref 1.7–7.7)
NEUTROS PCT: 76 %
Platelets: 26 10*3/uL — CL (ref 150–400)
RBC: 2.19 MIL/uL — AB (ref 3.87–5.11)
RDW: 14.4 % (ref 11.5–15.5)
WBC: 2.4 10*3/uL — AB (ref 4.0–10.5)
nRBC: 1.7 % — ABNORMAL HIGH (ref 0.0–0.2)

## 2018-05-29 LAB — PROTIME-INR
INR: 1.34
Prothrombin Time: 16.5 seconds — ABNORMAL HIGH (ref 11.4–15.2)

## 2018-05-29 LAB — APTT: aPTT: 36 seconds (ref 24–36)

## 2018-05-29 MED ORDER — NALOXONE HCL 0.4 MG/ML IJ SOLN
INTRAMUSCULAR | Status: AC
  Filled 2018-05-29: qty 1

## 2018-05-29 MED ORDER — FLUMAZENIL 0.5 MG/5ML IV SOLN
INTRAVENOUS | Status: AC
  Filled 2018-05-29: qty 5

## 2018-05-29 MED ORDER — MIDAZOLAM HCL 2 MG/2ML IJ SOLN
INTRAMUSCULAR | Status: AC
  Filled 2018-05-29: qty 2

## 2018-05-29 MED ORDER — FENTANYL CITRATE (PF) 100 MCG/2ML IJ SOLN
INTRAMUSCULAR | Status: AC
  Filled 2018-05-29: qty 2

## 2018-05-29 MED ORDER — SODIUM CHLORIDE 0.9 % IV SOLN
INTRAVENOUS | Status: DC
  Administered 2018-05-29: 07:00:00 via INTRAVENOUS

## 2018-05-29 MED ORDER — MIDAZOLAM HCL 2 MG/2ML IJ SOLN
INTRAMUSCULAR | Status: AC | PRN
  Administered 2018-05-29: 0.5 mg via INTRAVENOUS

## 2018-05-29 MED ORDER — FENTANYL CITRATE (PF) 100 MCG/2ML IJ SOLN
INTRAMUSCULAR | Status: AC | PRN
  Administered 2018-05-29: 12.5 ug via INTRAVENOUS

## 2018-05-29 NOTE — Procedures (Signed)
Interventional Radiology Procedure Note  Procedure: CT guided aspirate and core biopsy of right posterior iliac bone Complications: None Recommendations: - Bedrest supine x 1 hrs - OTC's PRN  Pain - Follow biopsy results  Signed,  Uriyah Raska S. Wright Gravely, DO    

## 2018-05-29 NOTE — Discharge Instructions (Signed)
Bone Marrow Aspiration and Bone Marrow Biopsy, Adult, Care After °This sheet gives you information about how to care for yourself after your procedure. Your health care provider may also give you more specific instructions. If you have problems or questions, contact your health care provider. °What can I expect after the procedure? °After the procedure, it is common to have: °· Mild pain and tenderness. °· Swelling. °· Bruising. ° °Follow these instructions at home: °· Take over-the-counter or prescription medicines only as told by your health care provider. °· Do not take baths, swim, or use a hot tub until your health care provider approves. Ask if you can take a shower or have a sponge bath. °· Follow instructions from your health care provider about how to take care of the puncture site. Make sure you: °? Wash your hands with soap and water before you change your bandage (dressing). If soap and water are not available, use hand sanitizer. °? Change your dressing as told by your health care provider. °· Check your puncture site every day for signs of infection. Check for: °? More redness, swelling, or pain. °? More fluid or blood. °? Warmth. °? Pus or a bad smell. °· Return to your normal activities as told by your health care provider. Ask your health care provider what activities are safe for you. °· Do not drive for 24 hours if you were given a medicine to help you relax (sedative). °· Keep all follow-up visits as told by your health care provider. This is important. °Contact a health care provider if: °· You have more redness, swelling, or pain around the puncture site. °· You have more fluid or blood coming from the puncture site. °· Your puncture site feels warm to the touch. °· You have pus or a bad smell coming from the puncture site. °· You have a fever. °· Your pain is not controlled with medicine. °This information is not intended to replace advice given to you by your health care provider. Make sure  you discuss any questions you have with your health care provider. °Document Released: 01/25/2005 Document Revised: 01/26/2016 Document Reviewed: 12/20/2015 °Elsevier Interactive Patient Education © 2018 Elsevier Inc. °Moderate Conscious Sedation, Adult, Care After °These instructions provide you with information about caring for yourself after your procedure. Your health care provider may also give you more specific instructions. Your treatment has been planned according to current medical practices, but problems sometimes occur. Call your health care provider if you have any problems or questions after your procedure. °What can I expect after the procedure? °After your procedure, it is common: °· To feel sleepy for several hours. °· To feel clumsy and have poor balance for several hours. °· To have poor judgment for several hours. °· To vomit if you eat too soon. ° °Follow these instructions at home: °For at least 24 hours after the procedure: ° °· Do not: °? Participate in activities where you could fall or become injured. °? Drive. °? Use heavy machinery. °? Drink alcohol. °? Take sleeping pills or medicines that cause drowsiness. °? Make important decisions or sign legal documents. °? Take care of children on your own. °· Rest. °Eating and drinking °· Follow the diet recommended by your health care provider. °· If you vomit: °? Drink water, juice, or soup when you can drink without vomiting. °? Make sure you have little or no nausea before eating solid foods. °General instructions °· Have a responsible adult stay with you until you are   awake and alert.  Take over-the-counter and prescription medicines only as told by your health care provider.  If you smoke, do not smoke without supervision.  Keep all follow-up visits as told by your health care provider. This is important. Contact a health care provider if:  You keep feeling nauseous or you keep vomiting.  You feel light-headed.  You develop a  rash.  You have a fever. Get help right away if:  You have trouble breathing. This information is not intended to replace advice given to you by your health care provider. Make sure you discuss any questions you have with your health care provider. Document Released: 04/28/2013 Document Revised: 12/11/2015 Document Reviewed: 10/28/2015 Elsevier Interactive Patient Education  [employer].

## 2018-05-29 NOTE — H&P (Signed)
Referring Physician(s): Katragadda,S  Supervising Physician: Corrie Mckusick  Patient Status:  [facility]  Chief Complaint:  "I'm here for a bone marrow biopsy"  Subjective: Patient familiar to our service from prior Port-A-Cath in 2017 as well as bone marrow biopsy on 10/03/2017. She has a history of of small cell lung cancer and now presents with high-grade MDS.  She is scheduled today for repeat bone marrow biopsy for further evaluation.  She currently denies fever, headache, chest pain, cough, abdominal/back pain, nausea, vomiting or bleeding. She is weak and does have some occasional dyspnea with exertion.  Past Medical History:  Diagnosis Date  . Allergy   . Cancer ([place])    lung  . Hyperlipidemia   . Iron deficiency anemia 12/15/2015  . Low vitamin B12 level 12/15/2015  . Osteopenia    Past Surgical History:  Procedure Laterality Date  . CATARACT EXTRACTION, BILATERAL Bilateral   . COLONOSCOPY N/A 12/06/2016   Procedure: COLONOSCOPY;  Surgeon: Danie Binder, MD;  [facility];  Service: Endoscopy;  Laterality: N/A;  1:45pm  . ESOPHAGOGASTRODUODENOSCOPY N/A 12/06/2016   Procedure: ESOPHAGOGASTRODUODENOSCOPY (EGD);  Surgeon: Danie Binder, MD;  [facility];  Service: Endoscopy;  Laterality: N/A;  . implanted port  2017  . MINOR EXCISION EAR CANAL CYST Left    30+ years ago  . SMALL INTESTINE SURGERY    . VIDEO BRONCHOSCOPY WITH ENDOBRONCHIAL ULTRASOUND N/A 12/11/2015   Procedure: VIDEO BRONCHOSCOPY WITH ENDOBRONCHIAL ULTRASOUND;  Surgeon: Melrose Nakayama, MD;  Location: [facility] OR;  Service: Thoracic;  Laterality: N/A;      Allergies: Flagyl [metronidazole hcl] and Penicillins  Medications: Prior to Admission medications   Medication Sig Start Date End Date Taking? Authorizing Provider  docusate sodium (COLACE) 100 MG capsule Take 100 mg by mouth 2 (two) times daily.    Yes [provider]  feeding supplement, ENSURE ENLIVE, (ENSURE  ENLIVE) LIQD Take 237 mLs by mouth 2 (two) times daily between meals. 05/21/18  Yes Shah, Pratik D, DO  ANORO ELLIPTA 62.5-25 MCG/INH AEPB Inhale 1 puff into the lungs daily.  04/07/18   [provider]  cyanocobalamin (,VITAMIN B-12,) 1000 MCG/ML injection Inject 1 mL (1,000 mcg total) into the muscle every 30 (thirty) days. 03/26/18   Derek Jack, MD  donepezil (ARICEPT) 10 MG tablet Take 1 tablet (10 mg total) by mouth at bedtime. 04/20/18   Hassell Done, Mary-Margaret, FNP  memantine (NAMENDA) 5 MG tablet Take 1 tablet (5 mg total) by mouth 2 (two) times daily. 03/31/18   Hassell Done, Mary-Margaret, FNP  ondansetron (ZOFRAN) 8 MG tablet Take 1 tablet (8 mg total) by mouth 2 (two) times daily. Start on the third day from the last day of chemotherapy 11/17/17   Derek Jack, MD  [facility] HFA 108 [phone] Base) MCG/ACT inhaler Inhale 2 puffs into the lungs every 6 (six) hours as needed for wheezing or shortness of breath.  05/04/18   [provider]  prochlorperazine (COMPAZINE) 10 MG tablet Take 1 tablet (10 mg total) by mouth every 6 (six) hours as needed for nausea or vomiting. 11/17/17   Derek Jack, MD     Vital Signs: Blood pressure 112/63, heart rate 89, temp 98.1, respirations 18, O2 sat 98% room air  Physical Exam awake, alert.  Chest clear to auscultation bilaterally.  Clean, intact right chest wall Port-A-Cath.  Heart with regular rate and rhythm.  Abdomen soft, positive bowel sounds, nontender.  No significant lower extremity edema.  Imaging: No results found.  Labs:  CBC: Recent Labs    05/20/18 0423 05/21/18 0542 05/25/18 0943 05/29/18 0701  WBC 1.3* 1.2* 1.3* SPECIMEN CLOTTED  HGB 7.8* 7.7* 8.2* SPECIMEN CLOTTED  HCT 23.8* 23.8* 26.1* SPECIMEN CLOTTED  PLT 51* 41* 27* SPECIMEN CLOTTED    COAGS: Recent Labs    10/03/17 0723 05/29/18 0701 05/29/18 0758  INR 1.09 SPECIMEN CLOTTED 1.34  APTT  --   --  36    BMP: Recent Labs     05/19/18 0734 05/20/18 0423 05/21/18 0542 05/25/18 0943  NA 142 142 140 139  K 4.1 3.7 4.1 4.2  CL 113* 111 109 105  CO2 '24 26 26 27  ' GLUCOSE 101* 91 89 97  BUN 33* 29* 28* 31*  CALCIUM 8.4* 8.4* 8.4* 9.0  CREATININE 1.23* 1.09* 1.08* 1.11*  GFRNONAA 40* 47* 47* 46*  GFRAA 47* 54* 55* 53*    LIVER FUNCTION TESTS: Recent Labs    04/27/18 1228 05/18/18 2210 05/19/18 0734 05/25/18 0943  BILITOT 0.7 1.1 1.7* 0.8  AST 26 42* 40 33  ALT 21 32 31 30  ALKPHOS 65 49 53 59  PROT 6.5 6.0* 5.7* 6.2*  ALBUMIN 3.5 3.0* 2.9* 3.3*    Assessment and Plan: Pt with history of of small cell lung cancer ; now presents with high-grade MDS.  She is scheduled today for CT guided bone marrow biopsy for further evaluation. Risks and benefits discussed with the patient/daughter including, but not limited to bleeding, infection, damage to adjacent structures or low yield requiring additional tests.  All of the patient's questions were answered, patient is agreeable to proceed. Consent signed and in chart.     Electronically Signed: D. Rowe Robert, PA-C 05/29/2018, 8:30 AM   I spent a total of 20 minutes at the the patient's bedside AND on the patient's hospital floor or unit, greater than 50% of which was counseling/coordinating care for CT guided bone marrow biopsy

## 2018-05-29 NOTE — Progress Notes (Signed)
CRITICAL VALUE ALERT  Critical Value:  Hgb 6.6  Date & Time Notied:  05/29/18 0915  Provider Notified: Corrie Mckusick  Orders Received/Actions taken: Acknowledged

## 2018-06-01 ENCOUNTER — Encounter: Payer: Self-pay | Admitting: Nurse Practitioner

## 2018-06-01 ENCOUNTER — Ambulatory Visit: Payer: Medicare Other | Admitting: Nurse Practitioner

## 2018-06-01 ENCOUNTER — Inpatient Hospital Stay (HOSPITAL_COMMUNITY): Payer: Medicare Other

## 2018-06-01 VITALS — BP 105/59 | HR 100 | Temp 97.0°F | Ht 65.0 in | Wt 136.2 lb

## 2018-06-01 DIAGNOSIS — D649 Anemia, unspecified: Secondary | ICD-10-CM | POA: Diagnosis not present

## 2018-06-01 DIAGNOSIS — R195 Other fecal abnormalities: Secondary | ICD-10-CM | POA: Insufficient documentation

## 2018-06-01 DIAGNOSIS — C3491 Malignant neoplasm of unspecified part of right bronchus or lung: Secondary | ICD-10-CM | POA: Diagnosis not present

## 2018-06-01 DIAGNOSIS — R5383 Other fatigue: Secondary | ICD-10-CM

## 2018-06-01 DIAGNOSIS — D46Z Other myelodysplastic syndromes: Secondary | ICD-10-CM

## 2018-06-01 LAB — CBC WITH DIFFERENTIAL/PLATELET
ABS IMMATURE GRANULOCYTES: 0.02 10*3/uL (ref 0.00–0.07)
Basophils Absolute: 0 10*3/uL (ref 0.0–0.1)
Basophils Relative: 0 %
EOS ABS: 0 10*3/uL (ref 0.0–0.5)
Eosinophils Relative: 1 %
HEMATOCRIT: 20.1 % — AB (ref 36.0–46.0)
HEMOGLOBIN: 6.3 g/dL — AB (ref 12.0–15.0)
IMMATURE GRANULOCYTES: 1 %
LYMPHS ABS: 0.4 10*3/uL — AB (ref 0.7–4.0)
Lymphocytes Relative: 18 %
MCH: 30.3 pg (ref 26.0–34.0)
MCHC: 31.3 g/dL (ref 30.0–36.0)
MCV: 96.6 fL (ref 80.0–100.0)
MONOS PCT: 7 %
Monocytes Absolute: 0.2 10*3/uL (ref 0.1–1.0)
NEUTROS PCT: 73 %
Neutro Abs: 1.6 10*3/uL — ABNORMAL LOW (ref 1.7–7.7)
Platelets: 30 10*3/uL — ABNORMAL LOW (ref 150–400)
RBC: 2.08 MIL/uL — ABNORMAL LOW (ref 3.87–5.11)
RDW: 14.2 % (ref 11.5–15.5)
WBC: 2.1 10*3/uL — ABNORMAL LOW (ref 4.0–10.5)
nRBC: 1.9 % — ABNORMAL HIGH (ref 0.0–0.2)

## 2018-06-01 LAB — COMPREHENSIVE METABOLIC PANEL
ALK PHOS: 53 U/L (ref 38–126)
ALT: 26 U/L (ref 0–44)
AST: 31 U/L (ref 15–41)
Albumin: 3.1 g/dL — ABNORMAL LOW (ref 3.5–5.0)
Anion gap: 8 (ref 5–15)
BILIRUBIN TOTAL: 0.8 mg/dL (ref 0.3–1.2)
BUN: 28 mg/dL — AB (ref 8–23)
CALCIUM: 9 mg/dL (ref 8.9–10.3)
CO2: 25 mmol/L (ref 22–32)
CREATININE: 1.44 mg/dL — AB (ref 0.44–1.00)
Chloride: 109 mmol/L (ref 98–111)
GFR calc Af Amer: 39 mL/min — ABNORMAL LOW (ref >60)
GFR calc non Af Amer: 33 mL/min — ABNORMAL LOW (ref >60)
Glucose, Bld: 113 mg/dL — ABNORMAL HIGH (ref 70–99)
Potassium: 3.8 mmol/L (ref 3.5–5.1)
Sodium: 142 mmol/L (ref 135–145)
Total Protein: 6.4 g/dL — ABNORMAL LOW (ref 6.5–8.1)

## 2018-06-01 LAB — LACTATE DEHYDROGENASE: LDH: 364 U/L — ABNORMAL HIGH (ref 98–192)

## 2018-06-01 LAB — PREPARE RBC (CROSSMATCH)

## 2018-06-01 NOTE — Progress Notes (Signed)
Referring Provider: Chevis Pretty, * Primary Care Physician:  Chevis Pretty, FNP Primary GI:  Dr. Oneida Alar  Chief Complaint  Patient presents with  . Anemia    denies fatigue    HPI:   Janet Fletcher is a 82 y.o. female who presents for follow-up on anemia and fatigue.  Patient was last seen in our office 11/27/2017 for heme positive stool, anemia due to stage III chronic kidney disease, fatigue.  Colonoscopy and EGD up-to-date 2018.  Deemed normocytic anemia due to gastritis, chronic renal insufficiency, chronic disease.  Gastric biopsies positive for H. pylori and treated with Pylera.  He had been seen by oncology for small cell lung cancer and anemia on Aranesp.  Bone marrow biopsy was completed and found to have myelodysplastic syndrome, high-grade with median survival in the absence of therapy of 1.6 years.  While seeing oncology she had a drop in her hemoglobin to 6.5 associated with weakness and fatigue.  Felt likely chemotherapy related MDS.  Response lost to erythropoiesis stimulating agents.  She was started on further therapy.  At her last visit she was having some fatigue and weakness with 2 falls recently.  Noted black bowel movement about once a week while not on iron.  No GERD symptoms, NSAIDs.  Appetite pretty good but has lost weight.  Taking 4 supplements such as Ensure a day.  No other GI symptoms.  Recommended continue follow-up with hematology/oncology, call us with any worsening black stools or blood in the stool, follow-up in 6 months.  The patient was recently admitted to the hospital 05/18/2018 - 05/21/2018 where she presented for worsening anemia and documented worsening pancytopenia and severe anemia.  She received a total of 3 units of PRBCs and 1 unit of platelets with subsequent improvement in hemoglobin.  Oncology saw the patient while admitted and recommended outpatient follow-up for bone marrow biopsy and further consideration for treatment versus  set up hospice care.  Stable hemoglobin is 7.7 at discharge and platelets of 41,000.  It appears her bone marrow biopsy was completed by interventional radiology on 05/29/2018.  Pathology report still pending.  Today she states she's doing ok overall. Denies fatigue No recent falls Still seeing oncology. Recently had transfusions with oncology. Feeling better since discharge, still kind of weak. No further melena since d/c. Has seen oncology f/u and sees them again on 11/20 to discuss plans/results of bone marrow completed 05/29/18. Denies abdominal pain, N/V, fever, chills. Her daughter thinks her weight is doing well. Objectively her weight is within 1 lb of 6 months ago.   Past Medical History:  Diagnosis Date  . Allergy   . Cancer ([place])    lung  . Hyperlipidemia   . Iron deficiency anemia 12/15/2015  . Low vitamin B12 level 12/15/2015  . Osteopenia     Past Surgical History:  Procedure Laterality Date  . CATARACT EXTRACTION, BILATERAL Bilateral   . COLONOSCOPY N/A 12/06/2016   Procedure: COLONOSCOPY;  Surgeon: Danie Binder, MD;  [facility];  Service: Endoscopy;  Laterality: N/A;  1:45pm  . ESOPHAGOGASTRODUODENOSCOPY N/A 12/06/2016   Procedure: ESOPHAGOGASTRODUODENOSCOPY (EGD);  Surgeon: Danie Binder, MD;  [facility];  Service: Endoscopy;  Laterality: N/A;  . implanted port  2017  . MINOR EXCISION EAR CANAL CYST Left    30+ years ago  . SMALL INTESTINE SURGERY    . VIDEO BRONCHOSCOPY WITH ENDOBRONCHIAL ULTRASOUND N/A 12/11/2015   Procedure: VIDEO BRONCHOSCOPY WITH ENDOBRONCHIAL ULTRASOUND;  Surgeon:  Melrose Nakayama, MD;  Location: [place];  Service: Thoracic;  Laterality: N/A;    Current Outpatient Medications  Medication Sig Dispense Refill  . ANORO ELLIPTA 62.5-25 MCG/INH AEPB Inhale 1 puff into the lungs daily.   0  . cyanocobalamin (,VITAMIN B-12,) 1000 MCG/ML injection Inject 1 mL (1,000 mcg total) into the muscle every 30 (thirty) days. 3 mL 2    . docusate sodium (COLACE) 100 MG capsule Take 100 mg by mouth 2 (two) times daily.     Marland Kitchen donepezil (ARICEPT) 10 MG tablet Take 1 tablet (10 mg total) by mouth at bedtime. 90 tablet 0  . feeding supplement, ENSURE ENLIVE, (ENSURE ENLIVE) LIQD Take 237 mLs by mouth 2 (two) times daily between meals. 237 mL 12  . memantine (NAMENDA) 5 MG tablet Take 1 tablet (5 mg total) by mouth 2 (two) times daily. 180 tablet 1  . ondansetron (ZOFRAN) 8 MG tablet Take 1 tablet (8 mg total) by mouth 2 (two) times daily. Start on the third day from the last day of chemotherapy 20 tablet 1  . PROAIR HFA 108 (90 Base) MCG/ACT inhaler Inhale 2 puffs into the lungs every 6 (six) hours as needed for wheezing or shortness of breath.   12  . prochlorperazine (COMPAZINE) 10 MG tablet Take 1 tablet (10 mg total) by mouth every 6 (six) hours as needed for nausea or vomiting. 30 tablet 1   No current facility-administered medications for this visit.    Facility-Administered Medications Ordered in Other Visits  Medication Dose Route Frequency Provider Last Rate Last Dose  . sodium chloride flush (NS) 0.9 % injection 10 mL  10 mL Intracatheter PRN Derek Jack, MD   10 mL at 03/26/18 1404    Allergies as of 06/01/2018 - Review Complete 06/01/2018  Allergen Reaction Noted  . Flagyl [metronidazole hcl] Nausea Only 08/05/2011  . Penicillins Other (See Comments) 08/05/2011    Family History  Problem Relation Age of Onset  . Cancer Mother   . Hypertension Father   . Colon cancer Neg Hx     Social History   Socioeconomic History  . Marital status: Married    Spouse name: Not on file  . Number of children: Not on file  . Years of education: Not on file  . Highest education level: Not on file  Occupational History  . Not on file  Social Needs  . Financial resource strain: Not on file  . Food insecurity:    Worry: Not on file    Inability: Not on file  . Transportation needs:    Medical: Not on file     Non-medical: Not on file  Tobacco Use  . Smoking status: Former Smoker    Packs/day: 0.25    Years: 50.00    Pack years: 12.50    Last attempt to quit: 01/08/2016    Years since quitting: 2.4  . Smokeless tobacco: Never Used  Substance and Sexual Activity  . Alcohol use: No  . Drug use: No  . Sexual activity: Not Currently  Lifestyle  . Physical activity:    Days per week: Not on file    Minutes per session: Not on file  . Stress: Not on file  Relationships  . Social connections:    Talks on phone: Not on file    Gets together: Not on file    Attends religious service: Not on file    Active member of club or organization: Not on file  Attends meetings of clubs or organizations: Not on file    Relationship status: Not on file  Other Topics Concern  . Not on file  Social History Narrative  . Not on file    Review of Systems: Complete ROS negative except as per HPI.   Physical Exam: BP (!) 105/59   Pulse 100   Temp (!) 97 F (36.1 C) (Oral)   Ht _0  (1.651 m)   Wt 136 lb 3.2 oz (61.8 kg)   BMI 22.66 kg/m  General:   Alert and oriented. Pleasant and cooperative. Well-nourished and well-developed. Appears fatigued. Ears:  Normal auditory acuity. Cardiovascular:  S1, S2 present without murmurs appreciated. Extremities without clubbing or edema. Respiratory:  Clear to auscultation bilaterally. No wheezes, rales, or rhonchi. No distress.  Gastrointestinal:  +BS, soft, non-tender and non-distended. No HSM noted. No guarding or rebound. No masses appreciated.  Rectal:  Deferred  Musculoskalatal:  Symmetrical without gross deformities. Neurologic:  Alert and oriented x4;  grossly normal neurologically. Psych:  Alert and cooperative. Normal mood and affect. Heme/Lymph/Immune: No excessive bruising noted.    06/08/2018 12:51 PM   Disclaimer: This note was dictated with voice recognition software. Similar sounding words can inadvertently be transcribed and may not be  corrected upon review.

## 2018-06-01 NOTE — Progress Notes (Unsigned)
CRITICAL VALUE ALERT Critical value received:  Hgb 6.3 and Platelets 30 Date of notification:  06/01/18 Time of notification: 10:58 Critical value read back:  Yes.   Nurse who received alert:  Venita Lick LPN MD notified (1st page):  Dr. Delton Coombes

## 2018-06-01 NOTE — Patient Instructions (Signed)
1. Continue taking your supplement such as Ensure.  As we discussed you can mix them with ice cream for an "Ensure milkshake". 2. Call if you have any dark stools or bloody stools. 3. Follow-up with oncology based on their recommendations. 4. Return for follow-up in 6 months. 5. Call us if you have any questions or concerns.  At [facility] we value your feedback. You may receive a survey about your visit today. Please share your experience as we strive to create trusting relationships with our patients to provide genuine, compassionate, quality care.  We appreciate your understanding and patience as we review any laboratory studies, imaging, and other diagnostic tests that are ordered as we care for you. Our office policy is 5 business days for review of these results, and any emergent or urgent results are addressed in a timely manner for your best interest. If you do not hear from our office in 1 week, please contact us.   We also encourage the use of MyChart, which contains your medical information for your review as well. If you are not enrolled in this feature, an access code is on this after visit summary for your convenience. Thank you for allowing [place] to be involved in your care.  It was great to see you today!  I hope you have a great Fall!!

## 2018-06-03 ENCOUNTER — Other Ambulatory Visit: Payer: Self-pay

## 2018-06-03 ENCOUNTER — Encounter (HOSPITAL_COMMUNITY): Payer: Self-pay

## 2018-06-03 ENCOUNTER — Inpatient Hospital Stay (HOSPITAL_COMMUNITY): Payer: Medicare Other

## 2018-06-03 ENCOUNTER — Ambulatory Visit (HOSPITAL_COMMUNITY): Payer: Medicare Other

## 2018-06-03 ENCOUNTER — Ambulatory Visit (HOSPITAL_COMMUNITY): Payer: Medicare Other | Admitting: Hematology

## 2018-06-03 DIAGNOSIS — D46Z Other myelodysplastic syndromes: Secondary | ICD-10-CM

## 2018-06-03 DIAGNOSIS — C3491 Malignant neoplasm of unspecified part of right bronchus or lung: Secondary | ICD-10-CM | POA: Diagnosis not present

## 2018-06-03 LAB — PREPARE RBC (CROSSMATCH)

## 2018-06-03 MED ORDER — DIPHENHYDRAMINE HCL 25 MG PO CAPS
25.0000 mg | ORAL_CAPSULE | Freq: Once | ORAL | Status: AC
  Administered 2018-06-03: 25 mg via ORAL
  Filled 2018-06-03: qty 1

## 2018-06-03 MED ORDER — SODIUM CHLORIDE 0.9% IV SOLUTION
250.0000 mL | Freq: Once | INTRAVENOUS | Status: AC
  Administered 2018-06-03: 250 mL via INTRAVENOUS

## 2018-06-03 MED ORDER — SODIUM CHLORIDE 0.9% FLUSH: 10.0000 mL | INTRAVENOUS | Status: DC | PRN

## 2018-06-03 MED ORDER — HEPARIN SOD (PORK) LOCK FLUSH 100 UNIT/ML IV SOLN
500.0000 [IU] | Freq: Every day | INTRAVENOUS | Status: AC | PRN
  Administered 2018-06-03: 500 [IU]
  Filled 2018-06-03: qty 5

## 2018-06-03 MED ORDER — ACETAMINOPHEN 325 MG PO TABS
650.0000 mg | ORAL_TABLET | Freq: Once | ORAL | Status: AC
  Administered 2018-06-03: 650 mg via ORAL
  Filled 2018-06-03: qty 2

## 2018-06-03 NOTE — Progress Notes (Signed)
2 units of blood given today per orders.   Treatment given per orders. Patient tolerated it well without problems. Vitals stable and discharged home from clinic via wheelchair with family at her side. Follow up as scheduled.

## 2018-06-03 NOTE — Patient Instructions (Signed)
[facility] Discharge Instructions     Thank you for choosing [facility] to provide your oncology and hematology care.  To afford each patient quality time with our provider, please arrive at least 15 minutes before your scheduled appointment time.   If you have a lab appointment with the Cancer Center please come in thru the  Main Entrance and check in at the main information desk  You need to re-schedule your appointment should you arrive 10 or more minutes late.  We strive to give you quality time with our providers, and arriving late affects you and other patients whose appointments are after yours.  Also, if you no show three or more times for appointments you may be dismissed from the clinic at the providers discretion.     Again, thank you for choosing [facility].  Our hope is that these requests will decrease the amount of time that you wait before being seen by our physicians.       _____________________________________________________________  Should you have questions after your visit to [facility], please contact our office at [phone] between the hours of 8:00 a.m. and 4:30 p.m.  Voicemails left after 4:00 p.m. will not be returned until the following business day.  For prescription refill requests, have your pharmacy contact our office and allow 72 hours.    Cancer Center Support Programs:   > Cancer Support Group  2nd Tuesday of the month 1pm-2pm, Journey Room    

## 2018-06-03 NOTE — Progress Notes (Signed)
Pt here today to receive 2 units of blood. VSS. No complaints today. Pt had a blood draw for blood 06/02/2018 but no blood bracelet worn today to the clinic.

## 2018-06-04 LAB — TYPE AND SCREEN
ABO/RH(D): A POS
ABO/RH(D): A POS
ANTIBODY SCREEN: NEGATIVE
Antibody Screen: NEGATIVE
UNIT DIVISION: 0
UNIT DIVISION: 0
Unit division: 0
Unit division: 0

## 2018-06-04 LAB — BPAM RBC
BLOOD PRODUCT EXPIRATION DATE: 201911192359
BLOOD PRODUCT EXPIRATION DATE: 201912012359
Blood Product Expiration Date: 201912072359
Blood Product Expiration Date: 201912112359
ISSUE DATE / TIME: 201911131054
ISSUE DATE / TIME: 201911131308
UNIT TYPE AND RH: 6200
Unit Type and Rh: 6200
Unit Type and Rh: 600
Unit Type and Rh: 600

## 2018-06-08 ENCOUNTER — Encounter: Payer: Self-pay | Admitting: Nurse Practitioner

## 2018-06-08 NOTE — Assessment & Plan Note (Addendum)
Fatigue in the setting of recent admission for anemia and pancytopenia with hemoglobin 7.7 at discharge.  Patient found to have myelodysplastic syndrome with median survival in the absence of therapy of 1.6 years.  Oncology continues to follow the patient for her anemia.  Fatigue is likely related to myelodysplastic syndrome and pancytopenia/anemia.  Recommend she continue to follow-up with oncology.  Continue current recommended supplements.  Follow-up with our office in 6 months.

## 2018-06-08 NOTE — Assessment & Plan Note (Signed)
Patient was just discharged about 2 weeks prior with a stable hemoglobin after transfusion.  She was admitted for worsening anemia and pancytopenia.  She continues to see oncology.  Oncology saw the patient while she was inpatient and recommended outpatient follow-up for bone marrow biopsy and further treatment versus hospice care.  Recommend she follow-up with oncology based on the recommendations for treatment of ongoing pancytopenia and anemia.  Follow-up in 6 months with our office.

## 2018-06-08 NOTE — Assessment & Plan Note (Addendum)
No further dark stools since discharge.  Recommend she call us for any recurrence of dark or bloody stools.  Follow-up in 6 months.  Colonoscopy and EGD are up-to-date completed in 2018.

## 2018-06-08 NOTE — Progress Notes (Signed)
CC'D TO PCP °

## 2018-06-10 ENCOUNTER — Encounter (HOSPITAL_COMMUNITY): Payer: Self-pay | Admitting: Hematology

## 2018-06-10 ENCOUNTER — Inpatient Hospital Stay (HOSPITAL_COMMUNITY): Payer: Medicare Other | Admitting: Hematology

## 2018-06-10 VITALS — BP 94/51 | HR 97 | Temp 97.5°F | Resp 18 | Wt 133.5 lb

## 2018-06-10 DIAGNOSIS — L89309 Pressure ulcer of unspecified buttock, unspecified stage: Secondary | ICD-10-CM

## 2018-06-10 DIAGNOSIS — C3491 Malignant neoplasm of unspecified part of right bronchus or lung: Secondary | ICD-10-CM

## 2018-06-10 DIAGNOSIS — F0390 Unspecified dementia without behavioral disturbance: Secondary | ICD-10-CM | POA: Diagnosis not present

## 2018-06-10 DIAGNOSIS — D46Z Other myelodysplastic syndromes: Secondary | ICD-10-CM | POA: Diagnosis not present

## 2018-06-10 DIAGNOSIS — R630 Anorexia: Secondary | ICD-10-CM

## 2018-06-10 DIAGNOSIS — R233 Spontaneous ecchymoses: Secondary | ICD-10-CM

## 2018-06-10 DIAGNOSIS — Z87891 Personal history of nicotine dependence: Secondary | ICD-10-CM

## 2018-06-10 MED ORDER — MIRTAZAPINE 7.5 MG PO TABS: 7.5000 mg | ORAL_TABLET | Freq: Every day | ORAL | 1 refills | Status: AC

## 2018-06-10 NOTE — Progress Notes (Signed)
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 hypodensity in liver    12/05/2015 PET scan    Large hypermetabolic paratracheal mass c/w SCLC, perihilar nodular densities in RML, mild metabolic activity RUL nodule, no distant metastatic disease    12/11/2015 Procedure    Video bronch with biopsies and brushings, endobronchial ultrasound with mediastinal LN aspiration. Dr. Roxan Hockey    12/11/2015 Pathology Results    Trachea biopsy negative, FNA RUL malignant cells c/w SCLC    12/13/2015 - 03/15/2016 Chemotherapy    Cisplatin/Etoposide x 5 cycles.  Cycle #6 cancelled due to significant cytopenias    12/25/2015 Imaging    MRI brain- No intracranial parenchymal enhancing lesion. Tiny right frontal calvarial enhancing lesion. Small metastatic lesion not excluded although this may represent an incidentally detected benign process.    12/29/2015 - 02/13/2016 Radiation Therapy    XRT    02/14/2016 Treatment Plan Change    Chemotherapy deferred x 1 week    02/21/2016 Treatment Plan Change    Carboplatin dose reduced by 20% and Etoposide dose reduced by 10%.    04/24/2016 Imaging    CT CAP- Chest Impression:  1. Marked reduction in mediastinal and RIGHT hilar lymphadenopathy with no residual pathologically enlarged nodes. 2. Resolution of perihilar nodularity in the RIGHT lung which was hypermetabolic on FDG PET. 3. Persistent nodule in the RIGHT upper lobe which is not hypermetabolic. 4. New  ground-glass nodule in the RIGHT upper lobe is nonspecific and may relate to therapy.  Abdomen / Pelvis Impression:  1. No evidence of metastatic disease in the abdomen pelvis. 2. Atherosclerotic calcification of the aorta.    04/24/2016 Imaging    Bone scan- Nonspecific increased tracer localization at approximately T11 vertebral body; this is of uncertain etiology, could represent a degenerative process though metastatic disease is not excluded.    04/29/2016 -  Radiation Therapy    PCI -- 25 Gy    07/24/2016 Imaging    CT CAP- 1. There has been interval decrease in size of mediastinal lymph nodes. The index lesion within the right upper lobe is also decreased in size from previous exam. 2. New subpleural nodule is noted within the posteromedial right lower lobe. Attention on follow-up imaging advised. 3. Stable ground-glass attenuating nodule within the right lower lobe and anteromedial left upper lobe 4. Aortic atherosclerosis and coronary artery calcification    07/29/2016 Imaging    MRI t-spine: 1. Stable benign slightly enhancing sclerotic lesion in the right posterolateral aspect of the T11 vertebral body, unchanged for 5 years. This is felt to account for the subtle increased activity at T11 on the SPECT bone scan. 2. Slight degenerative disc disease with small disc protrusions at T1-2 and T2-3 and T12-L1 with no neural impingement. 3. No evidence of metastatic disease.    09/23/2016 Imaging    CT chest- Radiation changes in the central right upper lobe/ perihilar region.  Stable small mediastinal lymph nodes, unchanged.  No findings suspicious for new/progressive metastatic disease.  12/25/2016 Imaging    CT C/A/P: IMPRESSION: 1. Continued further decrease in mediastinal lymph nodes. 2. Index nodule antero medial right upper lobe has decreased. The new 5 mm right lower lobe subpleural nodule seen on the previous study has resolved in the interval. Ground-glass  nodules in the upper lobes bilaterally are stable. 3. Interval development of interstitial and alveolar opacity in the parahilar right lung is presumably radiation related. Attention on follow-up recommended. 4. New tiny right pleural effusion with areas of apparent pleural enhancement in the posterior right costophrenic sulcus. Close attention on follow-up recommended. 5. Marked abdominal aortic atherosclerosis with likely infrarenal significant stenosis.     07/02/2017 Imaging    CT Chest w/ contrast: Resolution of central right lower lobe infiltrate and right pleural effusion since prior study.  Stable ground-glass opacities in both upper lobes.  No new or progressive disease    11/16/2017 -  Chemotherapy    The patient had palonosetron (ALOXI) injection 0.25 mg, 0.25 mg, Intravenous,  Once, 6 of 7 cycles Administration: 0.25 mg (11/17/2017), 0.25 mg (11/19/2017), 0.25 mg (11/21/2017), 0.25 mg (01/12/2018), 0.25 mg (01/14/2018), 0.25 mg (01/16/2018), 0.25 mg (12/16/2017), 0.25 mg (12/18/2017), 0.25 mg (02/16/2018), 0.25 mg (02/18/2018), 0.25 mg (02/20/2018), 0.25 mg (03/24/2018), 0.25 mg (03/26/2018), 0.25 mg (04/27/2018), 0.25 mg (04/29/2018), 0.25 mg (05/01/2018) azaCITIDine (VIDAZA) 130 mg in sodium chloride 0.9 % 50 mL chemo infusion, 75 mg/m2 = 130 mg, Intravenous, Once, 6 of 7 cycles Dose modification: 67.5 mg/m2 (90 % of original dose 75 mg/m2, Cycle 4, Reason: Provider Judgment, Comment: prolonged cytopenias) Administration: 130 mg (11/17/2017), 130 mg (11/18/2017), 130 mg (11/19/2017), 130 mg (11/20/2017), 130 mg (11/21/2017), 130 mg (12/16/2017), 130 mg (12/17/2017), 130 mg (12/18/2017), 130 mg (12/19/2017), 130 mg (01/12/2018), 130 mg (01/13/2018), 130 mg (01/14/2018), 130 mg (01/15/2018), 130 mg (01/16/2018), 115 mg (02/16/2018), 115 mg (02/17/2018), 115 mg (02/18/2018), 115 mg (02/19/2018), 115 mg (02/20/2018), 130 mg (03/24/2018), 130 mg (03/25/2018), 130 mg (03/26/2018), 130 mg (03/27/2018), 115 mg (04/27/2018), 115 mg  (04/28/2018), 115 mg (04/29/2018), 115 mg (04/30/2018), 115 mg (05/01/2018)  for chemotherapy treatment.      MDS (myelodysplastic syndrome), high grade (HCC)   10/20/2017 Initial Diagnosis    MDS (myelodysplastic syndrome), high grade ([facility])    11/16/2017 -  Chemotherapy    The patient had palonosetron (ALOXI) injection 0.25 mg, 0.25 mg, Intravenous,  Once, 6 of 7 cycles Administration: 0.25 mg (11/17/2017), 0.25 mg (11/19/2017), 0.25 mg (11/21/2017), 0.25 mg (01/12/2018), 0.25 mg (01/14/2018), 0.25 mg (01/16/2018), 0.25 mg (12/16/2017), 0.25 mg (12/18/2017), 0.25 mg (02/16/2018), 0.25 mg (02/18/2018), 0.25 mg (02/20/2018), 0.25 mg (03/24/2018), 0.25 mg (03/26/2018), 0.25 mg (04/27/2018), 0.25 mg (04/29/2018), 0.25 mg (05/01/2018) azaCITIDine (VIDAZA) 130 mg in sodium chloride 0.9 % 50 mL chemo infusion, 75 mg/m2 = 130 mg, Intravenous, Once, 6 of 7 cycles Dose modification: 67.5 mg/m2 (90 % of original dose 75 mg/m2, Cycle 4, Reason: Provider Judgment, Comment: prolonged cytopenias) Administration: 130 mg (11/17/2017), 130 mg (11/18/2017), 130 mg (11/19/2017), 130 mg (11/20/2017), 130 mg (11/21/2017), 130 mg (12/16/2017), 130 mg (12/17/2017), 130 mg (12/18/2017), 130 mg (12/19/2017), 130 mg (01/12/2018), 130 mg (01/13/2018), 130 mg (01/14/2018), 130 mg (01/15/2018), 130 mg (01/16/2018), 115 mg (02/16/2018), 115 mg (02/17/2018), 115 mg (02/18/2018), 115 mg (02/19/2018), 115 mg (02/20/2018), 130 mg (03/24/2018), 130 mg (03/25/2018), 130 mg (03/26/2018), 130 mg (03/27/2018), 115 mg (04/27/2018), 115 mg (04/28/2018), 115 mg (04/29/2018), 115 mg (04/30/2018), 115 mg (05/01/2018)  for chemotherapy treatment.       INTERVAL HISTORY:  Janet Fletcher 82 y.o. 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 She is here today with 5 of her children. They voice concerns with her memory decreasing and her appetite has also decreased. She is experiencing easy bruising and skin tears. She has developed a pressure ulcer on her bottom. She is not wanting to move  around as much and only gets up to go to the bathroom. Her family is requesting home health services and PT to come into the home for help. She denies any new pains. Denies any GI issues. Denies any fevers or recent infections. She reprots her appetite and energy level at 75%.    REVIEW OF SYSTEMS:  Review of Systems  Constitutional: Positive for fatigue.  Respiratory: Positive for shortness of breath.   Genitourinary: Positive for bladder incontinence.   Skin: Positive for itching.  Hematological: Bruises/bleeds easily.  All other systems reviewed and are negative.    PAST MEDICAL/SURGICAL HISTORY:  Past Medical History:  Diagnosis Date  . Allergy   . Cancer ([facility])    lung  . Hyperlipidemia   . Iron deficiency anemia 12/15/2015  . Low vitamin B12 level 12/15/2015  . Osteopenia    Past Surgical History:  Procedure Laterality Date  . CATARACT EXTRACTION, BILATERAL Bilateral   . COLONOSCOPY N/A 12/06/2016   Procedure: COLONOSCOPY;  Surgeon: Danie Binder, MD;  Location: [facility];  Service: Endoscopy;  Laterality: N/A;  1:45pm  . ESOPHAGOGASTRODUODENOSCOPY N/A 12/06/2016   Procedure: ESOPHAGOGASTRODUODENOSCOPY (EGD);  Surgeon: Danie Binder, MD;  Location: [facility];  Service: Endoscopy;  Laterality: N/A;  . implanted port  2017  . MINOR EXCISION EAR CANAL CYST Left    30+ years ago  . SMALL INTESTINE SURGERY    . VIDEO BRONCHOSCOPY WITH ENDOBRONCHIAL ULTRASOUND N/A 12/11/2015   Procedure: VIDEO BRONCHOSCOPY WITH ENDOBRONCHIAL ULTRASOUND;  Surgeon: Melrose Nakayama, MD;  Location: [facility];  Service: Thoracic;  Laterality: N/A;     SOCIAL HISTORY:  Social History   Socioeconomic History  . Marital status: Married    Spouse name: Not on file  . Number of children: Not on file  . Years of education: Not on file  . Highest education level: Not on file  Occupational History  . Not on file  Social Needs  . Financial resource strain: Not on file  . Food  insecurity:    Worry: Not on file    Inability: Not on file  . Transportation needs:    Medical: Not on file    Non-medical: Not on file  Tobacco Use  . Smoking status: Former Smoker    Packs/day: 0.25    Years: 50.00    Pack years: 12.50    Last attempt to quit: 01/08/2016    Years since quitting: 2.4  . Smokeless tobacco: Never Used  Substance and Sexual Activity  . Alcohol use: No  . Drug use: No  . Sexual activity: Not Currently  Lifestyle  . Physical activity:    Days per week: Not on file    Minutes per session: Not on file  . Stress: Not on file  Relationships  . Social connections:    Talks on phone: Not on file    Gets together: Not on file    Attends religious service: Not on file    Active member of club or organization: Not on file    Attends meetings of clubs or organizations: Not on file    Relationship status:  Not on file  . Intimate partner violence:    Fear of current or ex partner: Not on file    Emotionally abused: Not on file    Physically abused: Not on file    Forced sexual activity: Not on file  Other Topics Concern  . Not on file  Social History Narrative  . Not on file    FAMILY HISTORY:  Family History  Problem Relation Age of Onset  . Cancer Mother   . Hypertension Father   . Colon cancer Neg Hx     CURRENT MEDICATIONS:  Outpatient Encounter Medications as of 06/10/2018  Medication Sig Note  . albuterol (PROVENTIL HFA;VENTOLIN HFA) 108 (90 Base) MCG/ACT inhaler Inhale into the lungs.   . umeclidinium-vilanterol (ANORO ELLIPTA) 62.5-25 MCG/INH AEPB Inhale into the lungs.   Janet Fletcher ELLIPTA 62.5-25 MCG/INH AEPB Inhale 1 puff into the lungs daily.    . cyanocobalamin (,VITAMIN B-12,) 1000 MCG/ML injection Inject 1 mL (1,000 mcg total) into the muscle every 30 (thirty) days. 05/18/2018: 04/21/2018  . docusate sodium (COLACE) 100 MG capsule Take 100 mg by mouth 2 (two) times daily.    Marland Kitchen donepezil (ARICEPT) 10 MG tablet Take 1 tablet (10 mg  total) by mouth at bedtime.   . feeding supplement, ENSURE ENLIVE, (ENSURE ENLIVE) LIQD Take 237 mLs by mouth 2 (two) times daily between meals.   . memantine (NAMENDA) 5 MG tablet Take 1 tablet (5 mg total) by mouth 2 (two) times daily.   . mirtazapine (REMERON) 7.5 MG tablet Take 1 tablet (7.5 mg total) by mouth at bedtime.   . ondansetron (ZOFRAN) 8 MG tablet Take 1 tablet (8 mg total) by mouth 2 (two) times daily. Start on the third day from the last day of chemotherapy   . PROAIR HFA 108 (90 Base) MCG/ACT inhaler Inhale 2 puffs into the lungs every 6 (six) hours as needed for wheezing or shortness of breath.    . prochlorperazine (COMPAZINE) 10 MG tablet Take 1 tablet (10 mg total) by mouth every 6 (six) hours as needed for nausea or vomiting.    Facility-Administered Encounter Medications as of 06/10/2018  Medication  . sodium chloride flush (NS) 0.9 % injection 10 mL    ALLERGIES:  Allergies  Allergen Reactions  . Flagyl [Metronidazole Hcl] Nausea Only  . Penicillins Other (See Comments)    Has patient had a PCN reaction causing immediate rash, facial/tongue/throat swelling, SOB or lightheadedness with hypotension: unknown Has patient had a PCN reaction causing severe rash involving mucus membranes or skin necrosis: unknown Has patient had a PCN reaction that required hospitalization unknown Has patient had a PCN reaction occurring within the last 10 years: unknown  If all of the above answers are "NO", then may proceed with Cephalosporin use.     PHYSICAL EXAM:  ECOG Performance status: 1  Vitals:   06/10/18 0945  BP: (!) 94/51  Pulse: 97  Resp: 18  Temp: (!) 97.5 F (36.4 C)  SpO2: 100%   Filed Weights   06/10/18 0945  Weight: 133 lb 8 oz (60.6 kg)    Physical Exam  Constitutional: She is oriented to person, place, and time. She appears well-developed and well-nourished.  Neurological: She is alert and oriented to person, place, and time.  Skin: Skin is warm and  dry.  Psychiatric: She has a normal mood and affect. Her behavior is normal. Judgment and thought content normal.     LABORATORY DATA:  I have reviewed the  labs as listed.  CBC    Component Value Date/Time   WBC 2.1 (L) 06/01/2018 1014   RBC 2.08 (L) 06/01/2018 1014   HGB 6.3 (LL) 06/01/2018 1014   HCT 20.1 (L) 06/01/2018 1014   PLT 30 (L) 06/01/2018 1014   MCV 96.6 06/01/2018 1014   MCV 71.9 (A) 08/20/2013 1559   MCH 30.3 06/01/2018 1014   MCHC 31.3 06/01/2018 1014   RDW 14.2 06/01/2018 1014   LYMPHSABS 0.4 (L) 06/01/2018 1014   MONOABS 0.2 06/01/2018 1014   EOSABS 0.0 06/01/2018 1014   BASOSABS 0.0 06/01/2018 1014   CMP Latest Ref Rng & Units 06/01/2018 05/25/2018 05/21/2018  Glucose 70 - 99 mg/dL 113(H) 97 89  BUN 8 - 23 mg/dL 28(H) 31(H) 28(H)  Creatinine 0.44 - 1.00 mg/dL 1.44(H) 1.11(H) 1.08(H)  Sodium 135 - 145 mmol/L 142 139 140  Potassium 3.5 - 5.1 mmol/L 3.8 4.2 4.1  Chloride 98 - 111 mmol/L 109 105 109  CO2 22 - 32 mmol/L '25 27 26  ' Calcium 8.9 - 10.3 mg/dL 9.0 9.0 8.4(L)  Total Protein 6.5 - 8.1 g/dL 6.4(L) 6.2(L) -  Total Bilirubin 0.3 - 1.2 mg/dL 0.8 0.8 -  Alkaline Phos 38 - 126 U/L 53 59 -  AST 15 - 41 U/L 31 33 -  ALT 0 - 44 U/L 26 30 -        ASSESSMENT & PLAN:   MDS (myelodysplastic syndrome), high grade (HCC) 1. 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  Megakaryocytes are increased with frequent small, hyperlobulated and hyperchromatic forms.  Granulocytic precursors have no significant dysplasia. -Chromosome analysis shows 42, XX, del(3)(q21q29),add(7)(p15),del(13)(q12q14)[6]/46,XX[14] -FISH results show 5 q-, loss of chromosome 7, and trisomy 8. -She received a total of 5 points based on revised IPSS system, with a median survival in the  absence of therapy of 1.6 years, 25% AML progression in the absence of therapy and 1.4 years.  She has received chemotherapy with carboplatin and etoposide in 2017.  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  She has been receiving Aranesp for more than a year, and lost response. - 6 cycles of azacitidine from 11/17/2017 through 04/27/2018.   -She was admitted to the hospital on 05/10/2018 with a hemoglobin of 5, melena consistent with GI bleed, and required 3 units of blood transfusion.  She was also very weak at that time.  Her platelet count was 15.  She also received platelet transfusion to decrease the bleed. - We have repeated a bone marrow aspiration and biopsy on 05/29/2018.  This showed hypercellular bone marrow with evolving AML.  There is predominance of dyspoietic erythroid precursors with left-sided maturation associated with prominent interstitial infiltrates and expansile aggregates in the clot and biopsy sections.  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 - Cytogenetics reveal complex abnormalities with development of new deletions. - Her hemoglobin last week was 6.3.  She did receive 2 units of blood transfusion.  She felt better 2 to 3 days after the transfusion. - I had a prolonged discussion with all of her family members.  She did not have any response for 6 cycles of azacitidine.  Hence I did not recommend any further therapy.  She is not a candidate for any aggressive therapy given her  severe thrombocytopenia and GI bleeding. - We will continue to monitor her blood counts every 2 weeks and give her supportive transfusion as needed.  If supportive transfusion does not help her functional status, we will consider palliative care and hospice. - Family is concerned that her dementia is also getting worse.  She is not eating much.  We will start her on low-dose mirtazapine at 7.5 mg at bedtime which  will help her appetite and sleep. -I will reevaluate her in 6 weeks.  2.  Weight loss: She is drinking 4 cans of boost per day.  She is also eating minimally.  Hopefully mirtazapine will help with her appetite.  3.  Small cell lung cancer: She completed chemoradiation therapy around July 2017.  Last CT of the chest was done in December 2018.  We will order CT scan of her chest with contrast for follow-up.  Total time spent is 40 minutes with more than 50% of the time spent face-to-face discussing bone marrow biopsy results, prognosis, further plan of action and coordination of care.    Orders placed this encounter:  Orders Placed This Encounter  Procedures  . CBC with Differential/Platelet  . Comprehensive metabolic panel  . CBC with Differential/Platelet  . Comprehensive metabolic panel      Derek Jack, MD [place] [phone]

## 2018-06-10 NOTE — Assessment & Plan Note (Signed)
1. 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  Megakaryocytes are increased with frequent small, hyperlobulated and hyperchromatic forms.  Granulocytic precursors have no significant dysplasia. -Chromosome analysis shows 65, XX, del(3)(q21q29),add(7)(p15),del(13)(q12q14)[6]/46,XX[14] -FISH results show 5 q-, loss of chromosome 7, and trisomy 8. -She received a total of 5 points based on revised IPSS system, with a median survival in the absence of therapy of 1.6 years, 25% AML progression in the absence of therapy and 1.4 years.  She has received chemotherapy with carboplatin and etoposide in 2017.  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  She has been receiving Aranesp for more than a year, and lost response. - 6 cycles of azacitidine from 11/17/2017 through 04/27/2018.   -She was admitted to the hospital on 05/10/2018 with a hemoglobin of 5, melena consistent with GI bleed, and required 3 units of blood transfusion.  She was also very weak at that time.  Her platelet count was 15.  She also received platelet transfusion to decrease the bleed. - We have repeated a bone marrow aspiration and biopsy on 05/29/2018.  This showed hypercellular bone marrow with evolving AML.  There is predominance of dyspoietic erythroid precursors with left-sided maturation associated with prominent interstitial infiltrates and expansile aggregates in the clot and biopsy sections.  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 - Cytogenetics reveal complex abnormalities with development of new deletions. - Her hemoglobin last week was 6.3.  She did receive 2 units of blood transfusion.  She felt better 2 to 3 days after the transfusion. - I  had a prolonged discussion with all of her family members.  She did not have any response for 6 cycles of azacitidine.  Hence I did not recommend any further therapy.  She is not a candidate for any aggressive therapy given her severe thrombocytopenia and GI bleeding. - We will continue to monitor her blood counts every 2 weeks and give her supportive transfusion as needed.  If supportive transfusion does not help her functional status, we will consider palliative care and hospice. - Family is concerned that her dementia is also getting worse.  She is not eating much.  We will start her on low-dose mirtazapine at 7.5 mg at bedtime which will help her appetite and sleep. -I will reevaluate her in 6 weeks.  2.  Weight loss: She is drinking 4 cans of boost per day.  She is also eating minimally.  Hopefully mirtazapine will help with her appetite.  3.  Small cell lung cancer: She completed chemoradiation therapy around July 2017.  Last CT of the chest was done in December 2018.  We will order CT scan of her chest with contrast for follow-up.

## 2018-06-10 NOTE — Patient Instructions (Signed)
[facility] at [facility] Discharge Instructions     Thank you for choosing [facility] at [facility] to provide your oncology and hematology care.  To afford each patient quality time with our provider, please arrive at least 15 minutes before your scheduled appointment time.   If you have a lab appointment with the Cancer Center please come in thru the  Main Entrance and check in at the main information desk  You need to re-schedule your appointment should you arrive 10 or more minutes late.  We strive to give you quality time with our providers, and arriving late affects you and other patients whose appointments are after yours.  Also, if you no show three or more times for appointments you may be dismissed from the clinic at the providers discretion.     Again, thank you for choosing [facility].  Our hope is that these requests will decrease the amount of time that you wait before being seen by our physicians.       _____________________________________________________________  Should you have questions after your visit to [facility], please contact our office at [phone] between the hours of 8:00 a.m. and 4:30 p.m.  Voicemails left after 4:00 p.m. will not be returned until the following business day.  For prescription refill requests, have your pharmacy contact our office and allow 72 hours.    Cancer Center Support Programs:   > Cancer Support Group  2nd Tuesday of the month 1pm-2pm, Journey Room    

## 2018-06-15 ENCOUNTER — Inpatient Hospital Stay (HOSPITAL_COMMUNITY): Payer: Medicare Other

## 2018-06-15 DIAGNOSIS — C3491 Malignant neoplasm of unspecified part of right bronchus or lung: Secondary | ICD-10-CM | POA: Diagnosis not present

## 2018-06-15 DIAGNOSIS — Z9289 Personal history of other medical treatment: Secondary | ICD-10-CM

## 2018-06-15 DIAGNOSIS — D649 Anemia, unspecified: Secondary | ICD-10-CM

## 2018-06-15 DIAGNOSIS — D46Z Other myelodysplastic syndromes: Secondary | ICD-10-CM

## 2018-06-15 DIAGNOSIS — D469 Myelodysplastic syndrome, unspecified: Secondary | ICD-10-CM

## 2018-06-15 LAB — CBC WITH DIFFERENTIAL/PLATELET
Abs Immature Granulocytes: 0.02 10*3/uL (ref 0.00–0.07)
Basophils Absolute: 0 10*3/uL (ref 0.0–0.1)
Basophils Relative: 0 %
Eosinophils Absolute: 0 10*3/uL (ref 0.0–0.5)
Eosinophils Relative: 0 %
HEMATOCRIT: 21.8 % — AB (ref 36.0–46.0)
HEMOGLOBIN: 6.8 g/dL — AB (ref 12.0–15.0)
Immature Granulocytes: 1 %
LYMPHS ABS: 0.4 10*3/uL — AB (ref 0.7–4.0)
LYMPHS PCT: 23 %
MCH: 29.4 pg (ref 26.0–34.0)
MCHC: 31.2 g/dL (ref 30.0–36.0)
MCV: 94.4 fL (ref 80.0–100.0)
MONO ABS: 0.1 10*3/uL (ref 0.1–1.0)
MONOS PCT: 5 %
Neutro Abs: 1.4 10*3/uL — ABNORMAL LOW (ref 1.7–7.7)
Neutrophils Relative %: 71 %
Platelets: 13 10*3/uL — CL (ref 150–400)
RBC: 2.31 MIL/uL — ABNORMAL LOW (ref 3.87–5.11)
RDW: 13.8 % (ref 11.5–15.5)
WBC: 1.9 10*3/uL — ABNORMAL LOW (ref 4.0–10.5)
nRBC: 1 % — ABNORMAL HIGH (ref 0.0–0.2)

## 2018-06-15 LAB — SAMPLE TO BLOOD BANK

## 2018-06-15 LAB — PREPARE RBC (CROSSMATCH)

## 2018-06-15 NOTE — Progress Notes (Unsigned)
CRITICAL VALUE ALERT Critical value received: hemoglobin 6.8, platelets 13 Date of notification:  06-15-2018 Time of notification: 4401 Critical value read back:  Yes.   Nurse who received alert: C.Page RN MD notified (1st page):  Dr. Delton Coombes

## 2018-06-16 ENCOUNTER — Encounter (HOSPITAL_COMMUNITY): Payer: Self-pay

## 2018-06-16 ENCOUNTER — Encounter (HOSPITAL_COMMUNITY): Payer: Self-pay | Admitting: Dietician

## 2018-06-16 ENCOUNTER — Inpatient Hospital Stay (HOSPITAL_COMMUNITY): Payer: Medicare Other

## 2018-06-16 DIAGNOSIS — D46Z Other myelodysplastic syndromes: Secondary | ICD-10-CM

## 2018-06-16 DIAGNOSIS — D469 Myelodysplastic syndrome, unspecified: Secondary | ICD-10-CM

## 2018-06-16 DIAGNOSIS — C3491 Malignant neoplasm of unspecified part of right bronchus or lung: Secondary | ICD-10-CM | POA: Diagnosis not present

## 2018-06-16 DIAGNOSIS — Z9289 Personal history of other medical treatment: Secondary | ICD-10-CM

## 2018-06-16 DIAGNOSIS — D649 Anemia, unspecified: Secondary | ICD-10-CM

## 2018-06-16 MED ORDER — SODIUM CHLORIDE 0.9 % IV SOLN
INTRAVENOUS | Status: DC
  Administered 2018-06-16: 12:00:00 via INTRAVENOUS

## 2018-06-16 MED ORDER — HEPARIN SOD (PORK) LOCK FLUSH 100 UNIT/ML IV SOLN
500.0000 [IU] | Freq: Every day | INTRAVENOUS | Status: AC | PRN
  Administered 2018-06-16: 500 [IU]

## 2018-06-16 MED ORDER — SODIUM CHLORIDE 0.9% IV SOLUTION
250.0000 mL | Freq: Once | INTRAVENOUS | Status: AC
  Administered 2018-06-16: 250 mL via INTRAVENOUS

## 2018-06-16 MED ORDER — ACETAMINOPHEN 325 MG PO TABS
650.0000 mg | ORAL_TABLET | Freq: Once | ORAL | Status: AC
  Administered 2018-06-16: 650 mg via ORAL
  Filled 2018-06-16: qty 2

## 2018-06-16 MED ORDER — SODIUM CHLORIDE 0.9 % IV SOLN
Freq: Once | INTRAVENOUS | Status: AC
  Administered 2018-06-16: 09:00:00 via INTRAVENOUS

## 2018-06-16 MED ORDER — OCTREOTIDE ACETATE 30 MG IM KIT
PACK | INTRAMUSCULAR | Status: AC
  Filled 2018-06-16: qty 1

## 2018-06-16 MED ORDER — SODIUM CHLORIDE 0.9% FLUSH
10.0000 mL | INTRAVENOUS | Status: AC | PRN
  Administered 2018-06-16: 10 mL

## 2018-06-16 MED ORDER — DIPHENHYDRAMINE HCL 25 MG PO CAPS
25.0000 mg | ORAL_CAPSULE | Freq: Once | ORAL | Status: AC
  Administered 2018-06-16: 25 mg via ORAL
  Filled 2018-06-16: qty 1

## 2018-06-16 NOTE — Progress Notes (Signed)
Follow up Note:  Impromptu meeting with Pt/daughter. They are seen in treatment room alongside a separate patient whom RD was seeing. They desired to speak to RD.   Pt well known to RD. 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 She has dementia and chronic poor intake.   Daughter notes pt has minimal intake and is losing weight. Family really has to force her to eat. Patient herself cannot offer any history.   Pt is reliant on supplements, though daughter says she has "wore out" on the regular ensure enlives. When this RD saw pt inpatient, she did well with clear supplements. Feel ordering this would be appropriate for patient given circumstances.   Daughter appreciate, but notes they will not be back to center for a couple weeks. RD provided them with a case of Ensure Enlive in mean time.   Burtis Junes RD, LDN, CNSC Clinical Nutrition Available Tues-Sat via Pager: [phone] 06/16/2018 12:02 PM

## 2018-06-16 NOTE — Patient Instructions (Signed)
[place] at [facility] Discharge Instructions  Received 1 unit of blood and 1 unit of platelets today with hydration. Follow-up as scheduled. Call clinic for any questions or concerns   Thank you for choosing [place] at [facility] to provide your oncology and hematology care.  To afford each patient quality time with our provider, please arrive at least 15 minutes before your scheduled appointment time.   If you have a lab appointment with the [place] please come in thru the  Main Entrance and check in at the main information desk  You need to re-schedule your appointment should you arrive 10 or more minutes late.  We strive to give you quality time with our providers, and arriving late affects you and other patients whose appointments are after yours.  Also, if you no show three or more times for appointments you may be dismissed from the clinic at the providers discretion.     Again, thank you for choosing [facility].  Our hope is that these requests will decrease the amount of time that you wait before being seen by our physicians.       _____________________________________________________________  Should you have questions after your visit to [facility], please contact our office at [phone] between the hours of 8:00 a.m. and 4:30 p.m.  Voicemails left after 4:00 p.m. will not be returned until the following business day.  For prescription refill requests, have your pharmacy contact our office and allow 72 hours.    Cancer Center Support Programs:   > Cancer Support Group  2nd Tuesday of the month 1pm-2pm, Journey Room

## 2018-06-16 NOTE — Progress Notes (Signed)
0900 Reviewed B/P results of 78/50 and 81/38 with Dr. Delton Coombes and orders obtained for NS 250 ml to be given over 30 minutes at 500 ml/hr per MD prior to blood transfusion                                                     Janet Fletcher tolerated blood and platelet transfusions well without complaints or incident. VSS upon discharge. Pt discharged via wheelchair in satisfactory condition accompanied by family member

## 2018-06-17 ENCOUNTER — Other Ambulatory Visit: Payer: Self-pay | Admitting: Nurse Practitioner

## 2018-06-17 DIAGNOSIS — R413 Other amnesia: Secondary | ICD-10-CM

## 2018-06-17 LAB — PREPARE PLATELET PHERESIS: UNIT DIVISION: 0

## 2018-06-17 LAB — BPAM RBC
BLOOD PRODUCT EXPIRATION DATE: 201912242359
ISSUE DATE / TIME: 201911261019
Unit Type and Rh: 6200

## 2018-06-17 LAB — BPAM PLATELET PHERESIS
BLOOD PRODUCT EXPIRATION DATE: 201911262359
ISSUE DATE / TIME: 201911261225
UNIT TYPE AND RH: 5100

## 2018-06-17 LAB — TYPE AND SCREEN
ABO/RH(D): A POS
Antibody Screen: NEGATIVE
Unit division: 0

## 2018-06-22 ENCOUNTER — Other Ambulatory Visit: Payer: Self-pay | Admitting: Nurse Practitioner

## 2018-06-22 DIAGNOSIS — R413 Other amnesia: Secondary | ICD-10-CM

## 2018-06-22 DIAGNOSIS — D46Z Other myelodysplastic syndromes: Secondary | ICD-10-CM

## 2018-06-23 NOTE — Telephone Encounter (Signed)
TC back Janet Fletcher, pt need RF on Aricept & Namenda, Remeron came from the hospital it is not covered.  TC to [facility] both medications have refills with them, they will process them and send them to patient, [name] is not available to send until 07/01/18. Pt aware of the above information given to me from [facility]

## 2018-06-29 ENCOUNTER — Encounter (HOSPITAL_COMMUNITY): Payer: Self-pay

## 2018-06-29 ENCOUNTER — Inpatient Hospital Stay (HOSPITAL_COMMUNITY): Payer: Medicare Other | Attending: Hematology

## 2018-06-29 ENCOUNTER — Inpatient Hospital Stay (HOSPITAL_COMMUNITY): Payer: Medicare Other

## 2018-06-29 DIAGNOSIS — D46Z Other myelodysplastic syndromes: Secondary | ICD-10-CM | POA: Insufficient documentation

## 2018-06-29 DIAGNOSIS — D649 Anemia, unspecified: Secondary | ICD-10-CM

## 2018-06-29 DIAGNOSIS — D469 Myelodysplastic syndrome, unspecified: Secondary | ICD-10-CM

## 2018-06-29 LAB — COMPREHENSIVE METABOLIC PANEL
ALBUMIN: 3 g/dL — AB (ref 3.5–5.0)
ALT: 21 U/L (ref 0–44)
ANION GAP: 9 (ref 5–15)
AST: 38 U/L (ref 15–41)
Alkaline Phosphatase: 62 U/L (ref 38–126)
BUN: 34 mg/dL — ABNORMAL HIGH (ref 8–23)
CALCIUM: 8.6 mg/dL — AB (ref 8.9–10.3)
CO2: 22 mmol/L (ref 22–32)
Chloride: 112 mmol/L — ABNORMAL HIGH (ref 98–111)
Creatinine, Ser: 1.83 mg/dL — ABNORMAL HIGH (ref 0.44–1.00)
GFR calc non Af Amer: 26 mL/min — ABNORMAL LOW (ref >60)
GFR, EST AFRICAN AMERICAN: 30 mL/min — AB (ref >60)
Glucose, Bld: 115 mg/dL — ABNORMAL HIGH (ref 70–99)
POTASSIUM: 3.5 mmol/L (ref 3.5–5.1)
SODIUM: 143 mmol/L (ref 135–145)
Total Bilirubin: 1.1 mg/dL (ref 0.3–1.2)
Total Protein: 6.4 g/dL — ABNORMAL LOW (ref 6.5–8.1)

## 2018-06-29 LAB — CBC WITH DIFFERENTIAL/PLATELET
Abs Immature Granulocytes: 0.04 10*3/uL (ref 0.00–0.07)
BASOS ABS: 0 10*3/uL (ref 0.0–0.1)
BASOS PCT: 0 %
EOS ABS: 0 10*3/uL (ref 0.0–0.5)
EOS PCT: 0 %
HCT: 16.1 % — ABNORMAL LOW (ref 36.0–46.0)
Hemoglobin: 5 g/dL — CL (ref 12.0–15.0)
Immature Granulocytes: 2 %
Lymphocytes Relative: 31 %
Lymphs Abs: 0.8 10*3/uL (ref 0.7–4.0)
MCH: 28.7 pg (ref 26.0–34.0)
MCHC: 31.1 g/dL (ref 30.0–36.0)
MCV: 92.5 fL (ref 80.0–100.0)
MONO ABS: 0.2 10*3/uL (ref 0.1–1.0)
Monocytes Relative: 8 %
NRBC: 1.6 % — AB (ref 0.0–0.2)
Neutro Abs: 1.5 10*3/uL — ABNORMAL LOW (ref 1.7–7.7)
Neutrophils Relative %: 59 %
PLATELETS: 7 10*3/uL — AB (ref 150–400)
RBC: 1.74 MIL/uL — AB (ref 3.87–5.11)
RDW: 14.8 % (ref 11.5–15.5)
WBC: 2.5 10*3/uL — AB (ref 4.0–10.5)

## 2018-06-29 LAB — PREPARE RBC (CROSSMATCH)

## 2018-06-29 MED ORDER — ACETAMINOPHEN 325 MG PO TABS: 650.0000 mg | ORAL_TABLET | Freq: Once | ORAL | Status: DC

## 2018-06-29 MED ORDER — SODIUM CHLORIDE 0.9% FLUSH
10.0000 mL | INTRAVENOUS | Status: AC | PRN
  Administered 2018-06-29: 10 mL

## 2018-06-29 MED ORDER — SODIUM CHLORIDE 0.9% IV SOLUTION
250.0000 mL | Freq: Once | INTRAVENOUS | Status: AC
  Administered 2018-06-29: 250 mL via INTRAVENOUS

## 2018-06-29 MED ORDER — DIPHENHYDRAMINE HCL 25 MG PO CAPS
25.0000 mg | ORAL_CAPSULE | Freq: Once | ORAL | Status: AC
  Administered 2018-06-29: 25 mg via ORAL
  Filled 2018-06-29: qty 1

## 2018-06-29 MED ORDER — ACETAMINOPHEN 325 MG PO TABS
650.0000 mg | ORAL_TABLET | Freq: Once | ORAL | Status: AC
  Administered 2018-06-29: 650 mg via ORAL

## 2018-06-29 MED ORDER — HEPARIN SOD (PORK) LOCK FLUSH 100 UNIT/ML IV SOLN
500.0000 [IU] | Freq: Every day | INTRAVENOUS | Status: DC | PRN
  Filled 2018-06-29: qty 5

## 2018-06-29 MED ORDER — ACETAMINOPHEN 325 MG PO TABS
ORAL_TABLET | ORAL | Status: AC
  Filled 2018-06-29: qty 2

## 2018-06-29 MED ORDER — DIPHENHYDRAMINE HCL 25 MG PO CAPS: 25.0000 mg | ORAL_CAPSULE | Freq: Once | ORAL | Status: DC

## 2018-06-29 NOTE — Patient Instructions (Signed)
[place] at [facility] Discharge Instructions  Received 1 unit of blood and 1 unit of platelets today. Follow-up as scheduled. Call clinic for any questions or concerns   Thank you for choosing [place] at [facility] to provide your oncology and hematology care.  To afford each patient quality time with our provider, please arrive at least 15 minutes before your scheduled appointment time.   If you have a lab appointment with the [place] please come in thru the  Main Entrance and check in at the main information desk  You need to re-schedule your appointment should you arrive 10 or more minutes late.  We strive to give you quality time with our providers, and arriving late affects you and other patients whose appointments are after yours.  Also, if you no show three or more times for appointments you may be dismissed from the clinic at the providers discretion.     Again, thank you for choosing [facility].  Our hope is that these requests will decrease the amount of time that you wait before being seen by our physicians.       _____________________________________________________________  Should you have questions after your visit to [facility], please contact our office at [phone] between the hours of 8:00 a.m. and 4:30 p.m.  Voicemails left after 4:00 p.m. will not be returned until the following business day.  For prescription refill requests, have your pharmacy contact our office and allow 72 hours.    Cancer Center Support Programs:   > Cancer Support Group  2nd Tuesday of the month 1pm-2pm, Journey Room

## 2018-06-29 NOTE — Progress Notes (Signed)
Janet Fletcher presents today for 1 unit of PRBCs and 1 unit of platelets. Pt tolerated infusions well without incident or complaint. VSS upon completion of transfusions. Pt to return tomorrow for 1 units PRBCs and 1 unit of Platelets as well. Port dressing changed and antimicrobial disc in place, left accessed for use tomorrow. Discharged in satisfactory condition in presence of daughter.

## 2018-06-29 NOTE — Progress Notes (Unsigned)
CRITICAL VALUE ALERT  Critical Value:  Hgb 5.0; platelets 7  Date & Time Notied:  06/29/18 at 1143  Provider Notified: Dr. Delton Coombes  Orders Received/Actions taken: orders rec'd to transfuse 2 units PRBC and 2 units platelets.

## 2018-06-30 ENCOUNTER — Other Ambulatory Visit (HOSPITAL_COMMUNITY): Payer: Self-pay | Admitting: Hematology

## 2018-06-30 ENCOUNTER — Inpatient Hospital Stay (HOSPITAL_COMMUNITY): Payer: Medicare Other | Admitting: Dietician

## 2018-06-30 ENCOUNTER — Inpatient Hospital Stay (HOSPITAL_COMMUNITY): Payer: Medicare Other

## 2018-06-30 VITALS — BP 101/71 | HR 70 | Temp 97.5°F | Resp 18

## 2018-06-30 DIAGNOSIS — D46Z Other myelodysplastic syndromes: Secondary | ICD-10-CM | POA: Diagnosis not present

## 2018-06-30 MED ORDER — DIPHENHYDRAMINE HCL 25 MG PO CAPS
25.0000 mg | ORAL_CAPSULE | Freq: Once | ORAL | Status: AC
  Administered 2018-06-30: 25 mg via ORAL

## 2018-06-30 MED ORDER — HEPARIN SOD (PORK) LOCK FLUSH 100 UNIT/ML IV SOLN: 500.0000 [IU] | Freq: Every day | INTRAVENOUS | Status: DC | PRN

## 2018-06-30 MED ORDER — ACETAMINOPHEN 325 MG PO TABS
ORAL_TABLET | ORAL | Status: AC
  Filled 2018-06-30: qty 2

## 2018-06-30 MED ORDER — DIPHENHYDRAMINE HCL 25 MG PO CAPS
ORAL_CAPSULE | ORAL | Status: AC
  Filled 2018-06-30: qty 1

## 2018-06-30 MED ORDER — ACETAMINOPHEN 325 MG PO TABS
650.0000 mg | ORAL_TABLET | Freq: Once | ORAL | Status: AC
  Administered 2018-06-30: 650 mg via ORAL

## 2018-06-30 MED ORDER — SODIUM CHLORIDE 0.9% IV SOLUTION: 250.0000 mL | Freq: Once | INTRAVENOUS | Status: DC

## 2018-06-30 MED ORDER — SODIUM CHLORIDE 0.9% FLUSH: 10.0000 mL | INTRAVENOUS | Status: DC | PRN

## 2018-06-30 NOTE — Progress Notes (Signed)
Nutrition Follow-up 82 y/o female PMHx HLD, Dementia and SCLC -dx in 2017, successfully treated w/ chemoradiation. Developed MDS early this year, thought to be consequence of her past treatment for SCLC. Started chemo for therapy-related MDS 4/29.   Following up with patient today for formal reassessment. RD had informally spoken with her and her daughter 2 weeks ago. They were provided with a large number of supplements at that time.   Today, pt appears depleted entirely of energy. Her daughter is again with her and says the patient sits in the chair almost the entire day. She is reluctant to get up at all. Noted her hgb yesterday was 5.0.   Took brief diet recall. Yesterday dinner was [employer] + [employer], which she ate most of, and a vanilla pudding cup. This morning she ate a large bowl of oatmeal. Daughter reports a couple days ago the patient ate broccoli with cheese on it. She says they atleast attempt to get the patient to eat something 3-4x a day.   Unfortunately, daughter reports that the patient developed diarrhea from the Ensure Clear-Apple. She says it "goes right through her". She has been having increasing episodes of incontinence. Pt reports significant pain to rectum/skin on buttocks. Sounds to have MASD.   Lab-wise, the patient's creatinine has risen  From 1.44 4 weeks ago to 1.83 yesterday. After discussing with NP, this is felt related to dehydration/poor intake. Daughter reports the patient drinks a 16.9 oz propel each day, as well as her 2.5 16.9 oz bottles of water. She also drinks 3-4 8 oz supplements. Fluid intake sounds to be adequate by self, but she has also been having diarrhea.    No new weight on patient.   Wt Readings from Last 10 Encounters:  06/10/18 133 lb 8 oz (60.6 kg)  06/01/18 136 lb 3.2 oz (61.8 kg)  05/29/18 140 lb (63.5 kg)  05/25/18 140 lb 8 oz (63.7 kg)  05/21/18 145 lb 11.6 oz (66.1 kg)  05/01/18 141 lb (64 kg)  04/27/18 136 lb 9.6 oz (62 kg)   04/20/18 137 lb 3.2 oz (62.2 kg)  03/31/18 139 lb (63 kg)  03/24/18 136 lb 14.5 oz (62.1 kg)   MEDICATIONS: Marinol, Namenda, Aricept, zofran, B12, compazine, colace Chemo: Azacitidine  LABS: Albumin: 3.0 (3.1 11/11), Creat:1.83  from 1.44 (11/11), H/H:5.0/16.1  Recent Labs  Lab 06/29/18 1129  NA 143  K 3.5  CL 112*  CO2 22  BUN 34*  CREATININE 1.83*  CALCIUM 8.6*  GLUCOSE 115*    ANTHROPOMETRICS: Height:  Ht Readings from Last 1 Encounters:  06/01/18 _0  (1.651 m)   Weight:  Wt Readings from Last 1 Encounters:  06/10/18 133 lb 8 oz (60.6 kg)   BMI:  BMI Readings from Last 1 Encounters:  06/10/18 22.22 kg/m   Wt changes:   n/a -> no new wt   ESTIMATED ENERGY NEEDS:  Kcal: 1700-1900 (28-31 kcal/kg bw) Protein: 80-90g Pro (1.3-1.5g/kg bw) Fluid: 1.7-1.9 L fluid (44m/kcal) +enough to equate for stool losses  NUTRITION DIAGNOSIS:  Increased protein/kcal needs r/t cancer and cancer related treatments as evidenced by the nutritional recommendations for this disease state and tx   DOCUMENTATION CODES:  Not applicable  INTERVENTION:  Per discussion with NP, pt appears to be nearing EOL; pt no longer candidate for any MDS treatment and supportive transfusions do not appear to be helping improve patients functional status at all.   RD gave recommendations to help with hydration. Also provided recommendations  for managing diarrhea with certain foods; she should eat high soluble fiber foods or foods with starch/pectin. Recommended bananas, potatoes or a psyllium fiber supplement. Gave handouts titled "Diarrhea" and "Hydration".  Ensure clear had been provided due to taste fatigue with Ensure Enlive. It is surprising that the patient can tolerate the Ensure Enlive, but not the Ensure Clear; usually this is much better tolerated. RD provided patient/daughter with a variety of samples of different types of Ensure/Boost. Hopefully this will help reduce the monotony patient  is struggling with. RD also provided patient with rehydration powder to take when she has episodes of diarrhea.   Will monitor ongoing goals of care discussion  GOAL:  Patient will meet greater than or equal to 90% of their needs  Unknown if met.   MONITOR:  PO intake, Weight trends, Labs, Goals of Care  Next Visit: As needed  Burtis Junes RD, LDN, CNSC Clinical Nutrition Available Tues-Sat via Pager: [phone] 06/30/2018 9:19 AM

## 2018-06-30 NOTE — Patient Instructions (Signed)
[facility] at [facility]  Discharge Instructions:   _______________________________________________________________  Thank you for choosing [facility] at [facility] to provide your oncology and hematology care.  To afford each patient quality time with our providers, please arrive at least 15 minutes before your scheduled appointment.  You need to re-schedule your appointment if you arrive 10 or more minutes late.  We strive to give you quality time with our providers, and arriving late affects you and other patients whose appointments are after yours.  Also, if you no show three or more times for appointments you may be dismissed from the clinic.  Again, thank you for choosing [facility] at [facility]. Our hope is that these requests will allow you access to exceptional care and in a timely manner. _______________________________________________________________  If you have questions after your visit, please contact our office at [phone] between the hours of 8:30 a.m. and 5:00 p.m. Voicemails left after 4:30 p.m. will not be returned until the following business day. _______________________________________________________________  For prescription refill requests, have your pharmacy contact our office. _______________________________________________________________  Recommendations made by the consultant and any test results will be sent to your referring physician. _______________________________________________________________ 

## 2018-06-30 NOTE — Progress Notes (Signed)
Pt presents today for 1 unit blood and 1 unit platelets. VSS. Pt complains of pain in her buttocks.   1 unit of blood and 1 unit of platelets given today. Tolerated infusion without adverse affects. Vital signs stable. No complaints at this time. Discharged from clinic via wheelchair. F/U with [facility] as scheduled.

## 2018-07-01 LAB — BPAM RBC
Blood Product Expiration Date: 201912312359
Blood Product Expiration Date: 202001082359
ISSUE DATE / TIME: 201912091250
ISSUE DATE / TIME: 201912100930
UNIT TYPE AND RH: 6200
Unit Type and Rh: 6200

## 2018-07-01 LAB — TYPE AND SCREEN
ABO/RH(D): A POS
Antibody Screen: NEGATIVE
Unit division: 0
Unit division: 0

## 2018-07-02 LAB — PREPARE PLATELET PHERESIS
UNIT DIVISION: 0
Unit division: 0

## 2018-07-02 LAB — BPAM PLATELET PHERESIS
BLOOD PRODUCT EXPIRATION DATE: 201912102359
Blood Product Expiration Date: 201912112359
ISSUE DATE / TIME: 201912091437
ISSUE DATE / TIME: 201912101145
Unit Type and Rh: 5100
Unit Type and Rh: 8400

## 2018-07-06 ENCOUNTER — Telehealth: Payer: Self-pay | Admitting: Nurse Practitioner

## 2018-07-06 ENCOUNTER — Other Ambulatory Visit (HOSPITAL_COMMUNITY): Payer: Self-pay | Admitting: Nurse Practitioner

## 2018-07-06 DIAGNOSIS — D469 Myelodysplastic syndrome, unspecified: Secondary | ICD-10-CM

## 2018-07-06 NOTE — Telephone Encounter (Signed)
Both Namenda and Aricept were sent over to [facility]. Advised pt to call [facility] and gave him contact number. Pt voiced understanding.

## 2018-07-08 ENCOUNTER — Telehealth (HOSPITAL_COMMUNITY): Payer: Self-pay | Admitting: *Deleted

## 2018-07-08 ENCOUNTER — Other Ambulatory Visit (HOSPITAL_COMMUNITY): Payer: Self-pay | Admitting: *Deleted

## 2018-07-08 DIAGNOSIS — D649 Anemia, unspecified: Secondary | ICD-10-CM

## 2018-07-08 DIAGNOSIS — D469 Myelodysplastic syndrome, unspecified: Secondary | ICD-10-CM

## 2018-07-08 NOTE — Telephone Encounter (Signed)
Order, dx code, and demographic for [facility] sent to [place] via fax today 07/08/18.

## 2018-07-09 ENCOUNTER — Inpatient Hospital Stay (HOSPITAL_COMMUNITY): Payer: Medicare Other

## 2018-07-09 DIAGNOSIS — D46Z Other myelodysplastic syndromes: Secondary | ICD-10-CM | POA: Diagnosis not present

## 2018-07-09 LAB — COMPREHENSIVE METABOLIC PANEL
ALT: 17 U/L (ref 0–44)
AST: 30 U/L (ref 15–41)
Albumin: 3.2 g/dL — ABNORMAL LOW (ref 3.5–5.0)
Alkaline Phosphatase: 69 U/L (ref 38–126)
Anion gap: 9 (ref 5–15)
BUN: 24 mg/dL — ABNORMAL HIGH (ref 8–23)
CHLORIDE: 110 mmol/L (ref 98–111)
CO2: 24 mmol/L (ref 22–32)
Calcium: 8.9 mg/dL (ref 8.9–10.3)
Creatinine, Ser: 1.25 mg/dL — ABNORMAL HIGH (ref 0.44–1.00)
GFR calc Af Amer: 47 mL/min — ABNORMAL LOW (ref >60)
GFR, EST NON AFRICAN AMERICAN: 41 mL/min — AB (ref >60)
Glucose, Bld: 127 mg/dL — ABNORMAL HIGH (ref 70–99)
Potassium: 3.5 mmol/L (ref 3.5–5.1)
Sodium: 143 mmol/L (ref 135–145)
Total Bilirubin: 1.1 mg/dL (ref 0.3–1.2)
Total Protein: 6.7 g/dL (ref 6.5–8.1)

## 2018-07-09 LAB — CBC WITH DIFFERENTIAL/PLATELET
Abs Immature Granulocytes: 0.03 10*3/uL (ref 0.00–0.07)
Basophils Absolute: 0 10*3/uL (ref 0.0–0.1)
Basophils Relative: 0 %
Eosinophils Absolute: 0 10*3/uL (ref 0.0–0.5)
Eosinophils Relative: 0 %
HCT: 23 % — ABNORMAL LOW (ref 36.0–46.0)
Hemoglobin: 6.9 g/dL — CL (ref 12.0–15.0)
Immature Granulocytes: 2 %
Lymphocytes Relative: 34 %
Lymphs Abs: 0.6 10*3/uL — ABNORMAL LOW (ref 0.7–4.0)
MCH: 27.8 pg (ref 26.0–34.0)
MCHC: 30 g/dL (ref 30.0–36.0)
MCV: 92.7 fL (ref 80.0–100.0)
Monocytes Absolute: 0.4 10*3/uL (ref 0.1–1.0)
Monocytes Relative: 23 %
NEUTROS ABS: 0.8 10*3/uL — AB (ref 1.7–7.7)
Neutrophils Relative %: 41 %
Platelets: 4 10*3/uL — CL (ref 150–400)
RBC: 2.48 MIL/uL — ABNORMAL LOW (ref 3.87–5.11)
RDW: 15 % (ref 11.5–15.5)
WBC: 1.9 10*3/uL — ABNORMAL LOW (ref 4.0–10.5)
nRBC: 1.6 % — ABNORMAL HIGH (ref 0.0–0.2)

## 2018-07-09 NOTE — Progress Notes (Unsigned)
CRITICAL VALUE ALERT  Critical Value:  Hgb 6.9 and platelets 4  Date & Time Notied:  07/09/18 at 0955  Provider Notified: Dr. Delton Coombes  Orders Received/Actions taken: transfuse 2 units PRBC and 1 unit platelets on 07/10/18.

## 2018-07-10 ENCOUNTER — Encounter (HOSPITAL_COMMUNITY): Payer: Self-pay

## 2018-07-10 ENCOUNTER — Other Ambulatory Visit: Payer: Self-pay

## 2018-07-10 ENCOUNTER — Inpatient Hospital Stay (HOSPITAL_COMMUNITY): Payer: Medicare Other

## 2018-07-10 DIAGNOSIS — D46Z Other myelodysplastic syndromes: Secondary | ICD-10-CM | POA: Diagnosis not present

## 2018-07-10 DIAGNOSIS — Z9289 Personal history of other medical treatment: Secondary | ICD-10-CM

## 2018-07-10 DIAGNOSIS — D649 Anemia, unspecified: Secondary | ICD-10-CM

## 2018-07-10 DIAGNOSIS — D469 Myelodysplastic syndrome, unspecified: Secondary | ICD-10-CM

## 2018-07-10 LAB — PREPARE RBC (CROSSMATCH)

## 2018-07-10 MED ORDER — SODIUM CHLORIDE 0.9% FLUSH
10.0000 mL | INTRAVENOUS | Status: AC | PRN
  Administered 2018-07-10: 10 mL

## 2018-07-10 MED ORDER — HEPARIN SOD (PORK) LOCK FLUSH 100 UNIT/ML IV SOLN
500.0000 [IU] | Freq: Every day | INTRAVENOUS | Status: AC | PRN
  Administered 2018-07-10: 500 [IU]

## 2018-07-10 MED ORDER — SODIUM CHLORIDE 0.9% IV SOLUTION
250.0000 mL | Freq: Once | INTRAVENOUS | Status: AC
  Administered 2018-07-10: 250 mL via INTRAVENOUS

## 2018-07-10 MED ORDER — DIPHENHYDRAMINE HCL 25 MG PO CAPS
25.0000 mg | ORAL_CAPSULE | Freq: Once | ORAL | Status: AC
  Administered 2018-07-10: 25 mg via ORAL
  Filled 2018-07-10: qty 1

## 2018-07-10 MED ORDER — ACETAMINOPHEN 325 MG PO TABS
650.0000 mg | ORAL_TABLET | Freq: Once | ORAL | Status: AC
  Administered 2018-07-10: 650 mg via ORAL
  Filled 2018-07-10: qty 2

## 2018-07-10 NOTE — Patient Instructions (Signed)
[facility] at [facility]  Discharge Instructions:   _______________________________________________________________  Thank you for choosing [facility] at [facility] to provide your oncology and hematology care.  To afford each patient quality time with our providers, please arrive at least 15 minutes before your scheduled appointment.  You need to re-schedule your appointment if you arrive 10 or more minutes late.  We strive to give you quality time with our providers, and arriving late affects you and other patients whose appointments are after yours.  Also, if you no show three or more times for appointments you may be dismissed from the clinic.  Again, thank you for choosing [facility] Our hope is that these requests will allow you access to exceptional care and in a timely manner. _______________________________________________________________  If you have questions after your visit, please contact our office at [phone] between the hours of 8:30 a.m. and 5:00 p.m. Voicemails left after 4:30 p.m. will not be returned until the following business day. _______________________________________________________________  For prescription refill requests, have your pharmacy contact our office. _______________________________________________________________  Recommendations made by the consultant and any test results will be sent to your referring physician. _______________________________________________________________ 

## 2018-07-10 NOTE — Progress Notes (Signed)
Pt presents today for 2 Units PRBC's and 1 Unit of Platelets. VSS. Pt unable to hold head up for long periods without cupping her chin in her hand. Daughter at bedside states, " we can't get her to eat much other than some pudding this morning." Pt had an accident all over the bathroom, black tarry stool noted per [place], and [employer] CNA.   Treatment given today per MD orders. Tolerated infusion without adverse affects. Vital signs stable. No complaints at this time. Discharged from clinic via wheelchair with daughter and son at the bedside. F/U with [facility] as scheduled.

## 2018-07-11 LAB — PREPARE PLATELET PHERESIS: Unit division: 0

## 2018-07-11 LAB — BPAM PLATELET PHERESIS
Blood Product Expiration Date: 201912212359
ISSUE DATE / TIME: 201912200933
Unit Type and Rh: 6200

## 2018-07-13 ENCOUNTER — Other Ambulatory Visit (HOSPITAL_COMMUNITY): Payer: Medicare Other

## 2018-07-13 LAB — TYPE AND SCREEN
ABO/RH(D): A POS
Antibody Screen: NEGATIVE
Unit division: 0
Unit division: 0

## 2018-07-13 LAB — BPAM RBC
BLOOD PRODUCT EXPIRATION DATE: 202001182359
Blood Product Expiration Date: 202001142359
ISSUE DATE / TIME: 201912201300
ISSUE DATE / TIME: 201912201306
Unit Type and Rh: 6200
Unit Type and Rh: 6200

## 2018-07-14 ENCOUNTER — Encounter (HOSPITAL_COMMUNITY): Payer: Medicare Other

## 2018-07-15 ENCOUNTER — Emergency Department (HOSPITAL_COMMUNITY): Payer: Medicare Other

## 2018-07-15 ENCOUNTER — Other Ambulatory Visit: Payer: Self-pay

## 2018-07-15 ENCOUNTER — Encounter (HOSPITAL_COMMUNITY): Payer: Self-pay

## 2018-07-15 ENCOUNTER — Inpatient Hospital Stay (HOSPITAL_COMMUNITY)
Admission: EM | Admit: 2018-07-15 | Discharge: 2018-07-22 | DRG: 871 | Disposition: E | Payer: Medicare Other | Attending: Internal Medicine | Admitting: Internal Medicine

## 2018-07-15 DIAGNOSIS — Z6820 Body mass index (BMI) 20.0-20.9, adult: Secondary | ICD-10-CM

## 2018-07-15 DIAGNOSIS — G9341 Metabolic encephalopathy: Secondary | ICD-10-CM | POA: Diagnosis present

## 2018-07-15 DIAGNOSIS — D709 Neutropenia, unspecified: Secondary | ICD-10-CM | POA: Diagnosis present

## 2018-07-15 DIAGNOSIS — Z515 Encounter for palliative care: Secondary | ICD-10-CM | POA: Diagnosis present

## 2018-07-15 DIAGNOSIS — Z880 Allergy status to penicillin: Secondary | ICD-10-CM

## 2018-07-15 DIAGNOSIS — N183 Chronic kidney disease, stage 3 unspecified: Secondary | ICD-10-CM | POA: Insufficient documentation

## 2018-07-15 DIAGNOSIS — Z66 Do not resuscitate: Secondary | ICD-10-CM | POA: Diagnosis present

## 2018-07-15 DIAGNOSIS — E43 Unspecified severe protein-calorie malnutrition: Secondary | ICD-10-CM | POA: Diagnosis present

## 2018-07-15 DIAGNOSIS — E872 Acidosis, unspecified: Secondary | ICD-10-CM | POA: Diagnosis present

## 2018-07-15 DIAGNOSIS — C9200 Acute myeloblastic leukemia, not having achieved remission: Secondary | ICD-10-CM | POA: Diagnosis present

## 2018-07-15 DIAGNOSIS — Z9221 Personal history of antineoplastic chemotherapy: Secondary | ICD-10-CM | POA: Diagnosis not present

## 2018-07-15 DIAGNOSIS — E870 Hyperosmolality and hypernatremia: Secondary | ICD-10-CM | POA: Diagnosis present

## 2018-07-15 DIAGNOSIS — E785 Hyperlipidemia, unspecified: Secondary | ICD-10-CM | POA: Diagnosis present

## 2018-07-15 DIAGNOSIS — R7989 Other specified abnormal findings of blood chemistry: Secondary | ICD-10-CM | POA: Diagnosis not present

## 2018-07-15 DIAGNOSIS — G9340 Encephalopathy, unspecified: Secondary | ICD-10-CM

## 2018-07-15 DIAGNOSIS — D61818 Other pancytopenia: Secondary | ICD-10-CM | POA: Diagnosis present

## 2018-07-15 DIAGNOSIS — N179 Acute kidney failure, unspecified: Secondary | ICD-10-CM | POA: Insufficient documentation

## 2018-07-15 DIAGNOSIS — K922 Gastrointestinal hemorrhage, unspecified: Secondary | ICD-10-CM

## 2018-07-15 DIAGNOSIS — R627 Adult failure to thrive: Secondary | ICD-10-CM | POA: Diagnosis present

## 2018-07-15 DIAGNOSIS — Z79899 Other long term (current) drug therapy: Secondary | ICD-10-CM | POA: Diagnosis not present

## 2018-07-15 DIAGNOSIS — R4182 Altered mental status, unspecified: Secondary | ICD-10-CM | POA: Diagnosis not present

## 2018-07-15 DIAGNOSIS — Z8249 Family history of ischemic heart disease and other diseases of the circulatory system: Secondary | ICD-10-CM

## 2018-07-15 DIAGNOSIS — D6959 Other secondary thrombocytopenia: Secondary | ICD-10-CM | POA: Diagnosis present

## 2018-07-15 DIAGNOSIS — Z87891 Personal history of nicotine dependence: Secondary | ICD-10-CM

## 2018-07-15 DIAGNOSIS — J9811 Atelectasis: Secondary | ICD-10-CM | POA: Diagnosis present

## 2018-07-15 DIAGNOSIS — R6520 Severe sepsis without septic shock: Secondary | ICD-10-CM

## 2018-07-15 DIAGNOSIS — K921 Melena: Secondary | ICD-10-CM | POA: Diagnosis present

## 2018-07-15 DIAGNOSIS — F411 Generalized anxiety disorder: Secondary | ICD-10-CM | POA: Diagnosis present

## 2018-07-15 DIAGNOSIS — D649 Anemia, unspecified: Secondary | ICD-10-CM | POA: Diagnosis not present

## 2018-07-15 DIAGNOSIS — C3490 Malignant neoplasm of unspecified part of unspecified bronchus or lung: Secondary | ICD-10-CM | POA: Diagnosis present

## 2018-07-15 DIAGNOSIS — A419 Sepsis, unspecified organism: Principal | ICD-10-CM | POA: Diagnosis present

## 2018-07-15 DIAGNOSIS — Z881 Allergy status to other antibiotic agents status: Secondary | ICD-10-CM

## 2018-07-15 DIAGNOSIS — E860 Dehydration: Secondary | ICD-10-CM | POA: Diagnosis present

## 2018-07-15 DIAGNOSIS — D696 Thrombocytopenia, unspecified: Secondary | ICD-10-CM

## 2018-07-15 DIAGNOSIS — R778 Other specified abnormalities of plasma proteins: Secondary | ICD-10-CM | POA: Diagnosis present

## 2018-07-15 DIAGNOSIS — R5081 Fever presenting with conditions classified elsewhere: Secondary | ICD-10-CM

## 2018-07-15 DIAGNOSIS — D469 Myelodysplastic syndrome, unspecified: Secondary | ICD-10-CM | POA: Diagnosis not present

## 2018-07-15 DIAGNOSIS — Z806 Family history of leukemia: Secondary | ICD-10-CM

## 2018-07-15 DIAGNOSIS — Z923 Personal history of irradiation: Secondary | ICD-10-CM

## 2018-07-15 LAB — COMPREHENSIVE METABOLIC PANEL
ALT: 17 U/L (ref 0–44)
AST: 67 U/L — ABNORMAL HIGH (ref 15–41)
Albumin: 2.8 g/dL — ABNORMAL LOW (ref 3.5–5.0)
Alkaline Phosphatase: 54 U/L (ref 38–126)
Anion gap: 10 (ref 5–15)
BUN: 57 mg/dL — AB (ref 8–23)
CO2: 20 mmol/L — ABNORMAL LOW (ref 22–32)
Calcium: 8.7 mg/dL — ABNORMAL LOW (ref 8.9–10.3)
Chloride: 118 mmol/L — ABNORMAL HIGH (ref 98–111)
Creatinine, Ser: 2.09 mg/dL — ABNORMAL HIGH (ref 0.44–1.00)
GFR calc Af Amer: 25 mL/min — ABNORMAL LOW (ref >60)
GFR calc non Af Amer: 22 mL/min — ABNORMAL LOW (ref >60)
Glucose, Bld: 139 mg/dL — ABNORMAL HIGH (ref 70–99)
Potassium: 4.3 mmol/L (ref 3.5–5.1)
Sodium: 148 mmol/L — ABNORMAL HIGH (ref 135–145)
Total Bilirubin: 1.6 mg/dL — ABNORMAL HIGH (ref 0.3–1.2)
Total Protein: 6.1 g/dL — ABNORMAL LOW (ref 6.5–8.1)

## 2018-07-15 LAB — CBC WITH DIFFERENTIAL/PLATELET
Abs Immature Granulocytes: 0.29 10*3/uL — ABNORMAL HIGH (ref 0.00–0.07)
Basophils Absolute: 0 10*3/uL (ref 0.0–0.1)
Basophils Relative: 1 %
EOS ABS: 0 10*3/uL (ref 0.0–0.5)
Eosinophils Relative: 0 %
HCT: 21.9 % — ABNORMAL LOW (ref 36.0–46.0)
Hemoglobin: 6.8 g/dL — CL (ref 12.0–15.0)
Immature Granulocytes: 15 %
LYMPHS ABS: 0.4 10*3/uL — AB (ref 0.7–4.0)
Lymphocytes Relative: 22 %
MCH: 28.5 pg (ref 26.0–34.0)
MCHC: 31.1 g/dL (ref 30.0–36.0)
MCV: 91.6 fL (ref 80.0–100.0)
Monocytes Absolute: 0.6 10*3/uL (ref 0.1–1.0)
Monocytes Relative: 31 %
Neutro Abs: 0.6 10*3/uL — ABNORMAL LOW (ref 1.7–7.7)
Neutrophils Relative %: 31 %
Platelets: 3 10*3/uL — CL (ref 150–400)
RBC: 2.39 MIL/uL — ABNORMAL LOW (ref 3.87–5.11)
RDW: 15.6 % — ABNORMAL HIGH (ref 11.5–15.5)
WBC: 1.9 10*3/uL — ABNORMAL LOW (ref 4.0–10.5)
nRBC: 1.6 % — ABNORMAL HIGH (ref 0.0–0.2)

## 2018-07-15 LAB — URINALYSIS, ROUTINE W REFLEX MICROSCOPIC
Glucose, UA: NEGATIVE mg/dL
Ketones, ur: NEGATIVE mg/dL
Leukocytes, UA: NEGATIVE
Nitrite: NEGATIVE
Protein, ur: NEGATIVE mg/dL
Specific Gravity, Urine: 1.019 (ref 1.005–1.030)
pH: 5 (ref 5.0–8.0)

## 2018-07-15 LAB — I-STAT CG4 LACTIC ACID, ED
LACTIC ACID, VENOUS: 3.6 mmol/L — AB (ref 0.5–1.9)
Lactic Acid, Venous: 6.46 mmol/L (ref 0.5–1.9)

## 2018-07-15 LAB — PROTIME-INR
INR: 1.7
Prothrombin Time: 19.8 seconds — ABNORMAL HIGH (ref 11.4–15.2)

## 2018-07-15 LAB — TROPONIN I: Troponin I: 2.4 ng/mL (ref <0.03)

## 2018-07-15 MED ORDER — UMECLIDINIUM-VILANTEROL 62.5-25 MCG/INH IN AEPB
1.0000 | INHALATION_SPRAY | Freq: Every day | RESPIRATORY_TRACT | Status: DC
  Filled 2018-07-15: qty 14

## 2018-07-15 MED ORDER — LORAZEPAM 2 MG/ML IJ SOLN: 1.0000 mg | INTRAMUSCULAR | Status: DC | PRN

## 2018-07-15 MED ORDER — ALBUTEROL SULFATE HFA 108 (90 BASE) MCG/ACT IN AERS: 2.0000 | INHALATION_SPRAY | Freq: Four times a day (QID) | RESPIRATORY_TRACT | Status: DC | PRN

## 2018-07-15 MED ORDER — DIPHENHYDRAMINE HCL 50 MG/ML IJ SOLN: 12.5000 mg | INTRAMUSCULAR | Status: DC | PRN

## 2018-07-15 MED ORDER — SODIUM CHLORIDE 0.9% FLUSH: 3.0000 mL | Freq: Two times a day (BID) | INTRAVENOUS | Status: DC

## 2018-07-15 MED ORDER — MAGIC MOUTHWASH W/LIDOCAINE
15.0000 mL | Freq: Four times a day (QID) | ORAL | Status: DC | PRN
  Filled 2018-07-15: qty 15

## 2018-07-15 MED ORDER — NYSTATIN 100000 UNIT/GM EX POWD
Freq: Three times a day (TID) | CUTANEOUS | Status: DC | PRN
  Filled 2018-07-15: qty 15

## 2018-07-15 MED ORDER — DEXTROSE 5 % IV SOLN
INTRAVENOUS | Status: DC
  Administered 2018-07-15: 16:00:00 via INTRAVENOUS

## 2018-07-15 MED ORDER — TEMAZEPAM 7.5 MG PO CAPS: 7.5000 mg | ORAL_CAPSULE | Freq: Every evening | ORAL | Status: DC | PRN

## 2018-07-15 MED ORDER — SODIUM CHLORIDE 0.9% FLUSH: 3.0000 mL | INTRAVENOUS | Status: DC | PRN

## 2018-07-15 MED ORDER — SODIUM CHLORIDE 0.9 % IV SOLN
250.0000 mL | INTRAVENOUS | Status: DC | PRN
  Administered 2018-07-15: 250 mL via INTRAVENOUS

## 2018-07-15 MED ORDER — MORPHINE SULFATE (CONCENTRATE) 10 MG/0.5ML PO SOLN: 5.0000 mg | ORAL | Status: DC | PRN

## 2018-07-15 MED ORDER — GLYCOPYRROLATE 0.2 MG/ML IJ SOLN: 0.2000 mg | INTRAMUSCULAR | Status: DC | PRN

## 2018-07-15 MED ORDER — SODIUM CHLORIDE 0.9 % IV SOLN: INTRAVENOUS | Status: DC

## 2018-07-15 MED ORDER — SODIUM CHLORIDE 0.9 % IV SOLN
12.5000 mg | Freq: Four times a day (QID) | INTRAVENOUS | Status: DC | PRN
  Filled 2018-07-15: qty 0.5

## 2018-07-15 MED ORDER — ONDANSETRON 4 MG PO TBDP: 4.0000 mg | ORAL_TABLET | Freq: Four times a day (QID) | ORAL | Status: DC | PRN

## 2018-07-15 MED ORDER — VANCOMYCIN HCL IN DEXTROSE 750-5 MG/150ML-% IV SOLN: 750.0000 mg | INTRAVENOUS | Status: DC

## 2018-07-15 MED ORDER — ALUM & MAG HYDROXIDE-SIMETH 200-200-20 MG/5ML PO SUSP: 30.0000 mL | Freq: Four times a day (QID) | ORAL | Status: DC | PRN

## 2018-07-15 MED ORDER — LORAZEPAM 1 MG PO TABS: 1.0000 mg | ORAL_TABLET | ORAL | Status: DC | PRN

## 2018-07-15 MED ORDER — MORPHINE SULFATE (PF) 2 MG/ML IV SOLN: 1.0000 mg | INTRAVENOUS | Status: DC | PRN

## 2018-07-15 MED ORDER — BISACODYL 10 MG RE SUPP: 10.0000 mg | Freq: Every day | RECTAL | Status: DC | PRN

## 2018-07-15 MED ORDER — HALOPERIDOL 0.5 MG PO TABS
0.5000 mg | ORAL_TABLET | ORAL | Status: DC | PRN
  Filled 2018-07-15: qty 1

## 2018-07-15 MED ORDER — ACETAMINOPHEN 325 MG PO TABS: 650.0000 mg | ORAL_TABLET | Freq: Once | ORAL | Status: DC

## 2018-07-15 MED ORDER — METRONIDAZOLE IN NACL 5-0.79 MG/ML-% IV SOLN
500.0000 mg | Freq: Three times a day (TID) | INTRAVENOUS | Status: DC
  Filled 2018-07-15: qty 100

## 2018-07-15 MED ORDER — GLYCOPYRROLATE 1 MG PO TABS
1.0000 mg | ORAL_TABLET | ORAL | Status: DC | PRN
  Filled 2018-07-15: qty 1

## 2018-07-15 MED ORDER — ACETAMINOPHEN 650 MG RE SUPP: 650.0000 mg | Freq: Four times a day (QID) | RECTAL | Status: DC | PRN

## 2018-07-15 MED ORDER — HALOPERIDOL LACTATE 2 MG/ML PO CONC
0.5000 mg | ORAL | Status: DC | PRN
  Filled 2018-07-15: qty 0.3

## 2018-07-15 MED ORDER — SODIUM CHLORIDE 0.9 % IV SOLN
2.0000 g | Freq: Once | INTRAVENOUS | Status: AC
  Administered 2018-07-15: 2 g via INTRAVENOUS
  Filled 2018-07-15: qty 2

## 2018-07-15 MED ORDER — ACETAMINOPHEN 325 MG PO TABS: 650.0000 mg | ORAL_TABLET | Freq: Four times a day (QID) | ORAL | Status: DC | PRN

## 2018-07-15 MED ORDER — POLYVINYL ALCOHOL 1.4 % OP SOLN
1.0000 [drp] | Freq: Four times a day (QID) | OPHTHALMIC | Status: DC | PRN
  Filled 2018-07-15: qty 15

## 2018-07-15 MED ORDER — ONDANSETRON HCL 4 MG/2ML IJ SOLN: 4.0000 mg | Freq: Four times a day (QID) | INTRAMUSCULAR | Status: DC | PRN

## 2018-07-15 MED ORDER — PROCHLORPERAZINE EDISYLATE 10 MG/2ML IJ SOLN: 10.0000 mg | Freq: Four times a day (QID) | INTRAMUSCULAR | Status: DC | PRN

## 2018-07-15 MED ORDER — LOPERAMIDE HCL 2 MG PO CAPS: 2.0000 mg | ORAL_CAPSULE | ORAL | Status: DC | PRN

## 2018-07-15 MED ORDER — OXYBUTYNIN CHLORIDE 5 MG PO TABS
2.5000 mg | ORAL_TABLET | Freq: Four times a day (QID) | ORAL | Status: DC | PRN
  Filled 2018-07-15: qty 0.5

## 2018-07-15 MED ORDER — BIOTENE DRY MOUTH MT LIQD: 15.0000 mL | OROMUCOSAL | Status: DC | PRN

## 2018-07-15 MED ORDER — SODIUM CHLORIDE 0.9 % IV BOLUS
30.0000 mL/kg | Freq: Once | INTRAVENOUS | Status: AC
  Administered 2018-07-15: 1658.4 mL via INTRAVENOUS

## 2018-07-15 MED ORDER — HALOPERIDOL LACTATE 5 MG/ML IJ SOLN: 0.5000 mg | INTRAMUSCULAR | Status: DC | PRN

## 2018-07-15 MED ORDER — ALBUTEROL SULFATE (2.5 MG/3ML) 0.083% IN NEBU: 2.5000 mg | INHALATION_SOLUTION | RESPIRATORY_TRACT | Status: DC | PRN

## 2018-07-15 MED ORDER — SODIUM CHLORIDE 0.9 % IV SOLN: 1000.0000 mL | INTRAVENOUS | Status: DC

## 2018-07-15 MED ORDER — VANCOMYCIN HCL IN DEXTROSE 1-5 GM/200ML-% IV SOLN: 1000.0000 mg | Freq: Once | INTRAVENOUS | Status: DC

## 2018-07-15 NOTE — ED Notes (Signed)
CRITICAL VALUE ALERT  Critical Value:  Hemoglobin 6.8, platelets 3  Date & Time Notied:  Dr. Thurnell Garbe, 06/22/2018, 1215  Provider Notified: Dr. Thurnell Garbe  Orders Received/Actions taken: see chart

## 2018-07-15 NOTE — H&P (Signed)
History and Physical    Janet Fletcher LOV:[id number] DOB: [birth year] DOA: 07/02/2018  PCP: Chevis Pretty, FNP Patient coming from: Home  I have personally briefly reviewed patient's old medical records in [place]  Chief Complaint: Lethargy/black stools  HPI: Janet Fletcher is a unfortunate, pleasant 82 y.o. 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  Patient at bedside minimally responsive and as such history is obtained from family members were at bedside.  Family does endorse general lethargy with decreased responsiveness, generalized weakness, melanotic stools that have been ongoing.  Family denies any recent fevers or chills, no chest pain, no shortness of breath, no nausea, no vomiting, no abdominal pain, no syncopal episodes, no dysuria, no constipation, no hematemesis, no focal neurological symptoms.  ED Course: Patient seen in the ED, comprehensive metabolic profile obtained with a sodium of 148, chloride of 118, bicarb of 20, glucose 139, BUN of 57, creatinine of 2.09, albumin of 2.8, AST of 67, protein of 6.1, bilirubin of 1.6 otherwise was within normal limits.  Lactic acid was 3.60.  Troponin was 2.40.  CBC had a white count of 1.9 hemoglobin 6.8 platelet count of 3.  INR was 1.70.  Patient noted on presentation to have a temperature of 101.9, tachycardic with heart rate of 140,  tachypneic with a respiratory rate of 48 and a blood pressure of 66/50.  Patient IV fluids with some improvement with blood pressure.  Blood cultures obtained.  Urinalysis was nitrite negative leukocytes  0-5 WBCs.  IV vancomycin IV Azactam were ordered.  Chest x-ray obtained with low lung volumes with bibasilar atelectasis and possibly small left pleural effusion.  EKG with a sinus tachycardia with no ischemic changes noted.  Hospitalist were called to admit the patient for further evaluation and management.  Per ED physician family leaning more towards comfort measures.  Review of Systems: As per HPI otherwise 10 point review of systems negative.  Past Medical History:  Diagnosis Date  . Allergy   . Cancer ([place])    lung  . Hyperlipidemia   . Iron deficiency anemia 12/15/2015  . Low vitamin B12 level 12/15/2015  . Osteopenia     Past Surgical History:  Procedure Laterality Date  . CATARACT EXTRACTION, BILATERAL Bilateral   . COLONOSCOPY N/A 12/06/2016   Procedure: COLONOSCOPY;  Surgeon: Danie Binder, MD;  [facility];  Service: Endoscopy;  Laterality: N/A;  1:45pm  . ESOPHAGOGASTRODUODENOSCOPY N/A 12/06/2016   Procedure: ESOPHAGOGASTRODUODENOSCOPY (EGD);  Surgeon: Danie Binder, MD;  Location: [facility];  Service: Endoscopy;  Laterality: N/A;  . implanted port  2017  . MINOR EXCISION EAR CANAL CYST Left    30+ years ago  . SMALL INTESTINE SURGERY    . VIDEO BRONCHOSCOPY WITH ENDOBRONCHIAL ULTRASOUND N/A 12/11/2015   Procedure: VIDEO BRONCHOSCOPY WITH ENDOBRONCHIAL ULTRASOUND;  Surgeon: Melrose Nakayama, MD;  Location: [place];  Service: Thoracic;  Laterality: N/A;     reports that she quit smoking about 2 years ago. She  has a 12.50 pack-year smoking history. She has never used smokeless tobacco. She reports that she does not drink alcohol or use drugs.  Allergies  Allergen Reactions  . Flagyl [Metronidazole Hcl] Nausea Only  . Penicillins Other (See Comments)     Has patient had a PCN reaction causing immediate rash, facial/tongue/throat swelling, SOB or lightheadedness with hypotension: unknown Has patient had a PCN reaction causing severe rash involving mucus membranes or skin necrosis: unknown Has patient had a PCN reaction that required hospitalization unknown Has patient had a PCN reaction occurring within the last 10 years: unknown  If all of the above answers are "NO", then may proceed with Cephalosporin use.    Family History  Problem Relation Age of Onset  . Cancer Mother   . Hypertension Father   . Colon cancer Neg Hx   Family history reviewed and not pertinent.  Mother deceased age 7 from leukemia.  Father deceased in his 54s family unsure as to cause of death.  Prior to Admission medications   Medication Sig Start Date End Date Taking? Authorizing Provider  albuterol (PROVENTIL HFA;VENTOLIN HFA) 108 (90 Base) MCG/ACT inhaler Inhale into the lungs. 05/04/18   [provider]  ANORO ELLIPTA 62.5-25 MCG/INH AEPB Inhale 1 puff into the lungs daily.  04/07/18   [provider]  cyanocobalamin (,VITAMIN B-12,) 1000 MCG/ML injection Inject 1 mL (1,000 mcg total) into the muscle every 30 (thirty) days. 03/26/18   Derek Jack, MD  docusate sodium (COLACE) 100 MG capsule Take 100 mg by mouth 2 (two) times daily.     [provider]  donepezil (ARICEPT) 10 MG tablet TAKE 1 TABLET BY MOUTH AT  BEDTIME 06/17/18   Hassell Done, Mary-Margaret, FNP  feeding supplement, ENSURE ENLIVE, (ENSURE ENLIVE) LIQD Take 237 mLs by mouth 2 (two) times daily between meals. 05/21/18   Manuella Ghazi, Pratik D, DO  memantine (NAMENDA) 5 MG tablet Take 1 tablet (5 mg total) by mouth 2 (two) times daily. 03/31/18   Hassell Done, Mary-Margaret, FNP  mirtazapine (REMERON) 7.5 MG tablet Take 1 tablet (7.5 mg total) by mouth at bedtime. 06/10/18   Lockamy, Randi L, NP-C  ondansetron (ZOFRAN) 8 MG tablet Take 1 tablet (8 mg total) by mouth 2 (two) times daily. Start  on the third day from the last day of chemotherapy 11/17/17   Derek Jack, MD  [facility] HFA 108 [phone] Base) MCG/ACT inhaler Inhale 2 puffs into the lungs every 6 (six) hours as needed for wheezing or shortness of breath.  05/04/18   [provider]  prochlorperazine (COMPAZINE) 10 MG tablet Take 1 tablet (10 mg total) by mouth every 6 (six) hours as needed for nausea or vomiting. 11/17/17   Derek Jack, MD  umeclidinium-vilanterol (ANORO ELLIPTA) 62.5-25 MCG/INH AEPB Inhale into the lungs. 04/07/18   [provider]    Physical Exam: Vitals:   07/21/2018 1246 07/01/2018 1300 07/12/2018 1330 06/21/2018 1400  BP:  1'14/70 99/64 96/66 '$  Pulse:  (!) 130 (!) 134 (!) 132  Resp:  (!) 48 (!) 45 (!) 40  Temp:      TempSrc:      SpO2:  90% 92% 91%  Weight: 56.2 kg     Height: '5\' 5"'$  (1.651 m)       Constitutional: Pale.  Cachectic.  Chronically ill looking. Vitals:   07/13/2018 1246 07/01/2018 1300 06/28/2018 1330 07/01/2018 1400  BP:  1'14/70 99/64 96/66 '$  Pulse:  (!) 130 (!) 134 (!) 132  Resp:  Marland Kitchen)  48 (!) 45 (!) 40  Temp:      TempSrc:      SpO2:  90% 92% 91%  Weight: 56.2 kg     Height: _0  (1.651 m)      Eyes: PERRLA, lids and conjunctivae pale ENMT: Mucous membranes are dry.  Patient not opening her mouth and as such unable to examine posterior pharynx nor dentition.  Neck: normal, supple, no masses, no thyromegaly Respiratory: Coarse breath sounds anterior lung fields.  Some gurgling noted.  No wheezing, no crackles.  No use of accessory muscles of respiration.  Cardiovascular: Tachycardic, no murmurs rubs or gallops.  No lower extremity edema.  No carotid bruits noted.   Abdomen: no tenderness, no masses palpated. No hepatosplenomegaly. Bowel sounds positive.  Musculoskeletal: no clubbing / cyanosis. No joint deformity upper and lower extremities. Good ROM, no contractures. Normal muscle tone.  Skin: no rashes, lesions, ulcers. No induration Neurologic: CN 2-12 grossly  intact. Sensation intact, DTR normal. Strength 5/5 in all 4.  Psychiatric: Normal judgment and insight. Alert and oriented x 3. Normal mood.   Labs on Admission: I have personally reviewed following labs and imaging studies  CBC: Recent Labs  Lab 07/09/18 0939 06/21/2018 1107  WBC 1.9* 1.9*  NEUTROABS 0.8* 0.6*  HGB 6.9* 6.8*  HCT 23.0* 21.9*  MCV 92.7 91.6  PLT 4* 3*   Basic Metabolic Panel: Recent Labs  Lab 07/09/18 0939 07/19/2018 1107  NA 143 148*  K 3.5 4.3  CL 110 118*  CO2 24 20*  GLUCOSE 127* 139*  BUN 24* 57*  CREATININE 1.25* 2.09*  CALCIUM 8.9 8.7*   GFR: Estimated Creatinine Clearance: 19 mL/min (A) (by C-G formula based on SCr of 2.09 mg/dL (H)). Liver Function Tests: Recent Labs  Lab 07/09/18 0939 07/01/2018 1107  AST 30 67*  ALT 17 17  ALKPHOS 69 54  BILITOT 1.1 1.6*  PROT 6.7 6.1*  ALBUMIN 3.2* 2.8*   No results for input(s): LIPASE, AMYLASE in the last 168 hours. No results for input(s): AMMONIA in the last 168 hours. Coagulation Profile: Recent Labs  Lab 06/28/2018 1107  INR 1.70   Cardiac Enzymes: Recent Labs  Lab 07/13/2018 1107  TROPONINI 2.40*   BNP (last 3 results) No results for input(s): PROBNP in the last 8760 hours. HbA1C: No results for input(s): HGBA1C in the last 72 hours. CBG: No results for input(s): GLUCAP in the last 168 hours. Lipid Profile: No results for input(s): CHOL, HDL, LDLCALC, TRIG, CHOLHDL, LDLDIRECT in the last 72 hours. Thyroid Function Tests: No results for input(s): TSH, T4TOTAL, FREET4, T3FREE, THYROIDAB in the last 72 hours. Anemia Panel: No results for input(s): VITAMINB12, FOLATE, FERRITIN, TIBC, IRON, RETICCTPCT in the last 72 hours. Urine analysis:    Component Value Date/Time   COLORURINE AMBER (A) 07/02/2018 1049   APPEARANCEUR HAZY (A) 07/10/2018 1049   LABSPEC 1.019 06/24/2018 1049   PHURINE 5.0 06/28/2018 1049   GLUCOSEU NEGATIVE 07/20/2018 1049   HGBUR SMALL (A) 06/23/2018 1049    BILIRUBINUR SMALL (A) 07/06/2018 1049   BILIRUBINUR NEG 09/18/2015 1037   KETONESUR NEGATIVE 07/19/2018 1049   PROTEINUR NEGATIVE 07/12/2018 1049   UROBILINOGEN negative 09/18/2015 1037   NITRITE NEGATIVE 07/02/2018 1049   LEUKOCYTESUR NEGATIVE 07/03/2018 1049    Radiological Exams on Admission: Dg Chest Portable 1 View  Result Date: 07/09/2018 CLINICAL DATA:  Lethargy and history of myelodysplasia and lung carcinoma. EXAM: PORTABLE CHEST 1 VIEW COMPARISON:  Chest x-ray on 05/18/2018 and  CT of the chest on 04/28/2018 FINDINGS: Stable positioning of Port-A-Cath. Stable heart size. Stable aortic tortuosity. Low lung volumes with bibasilar atelectasis and potential component of a small left pleural effusion. There is no evidence of pulmonary edema, consolidation, pneumothorax or visible mass. IMPRESSION: Low lung volumes with bibasilar atelectasis and possibly small left pleural effusion. Electronically Signed   By: Aletta Edouard M.D.   On: 07/13/2018 12:26    EKG: Independently reviewed.  Sinus tachycardia.  No ischemic changes noted.  Assessment/Plan Principal Problem:   Sepsis ([place]) Active Problems:   Neutropenic fever ([facility])   Pancytopenia (H[facility])   Melena   Severe thrombocytopenia ([facility])   Hyperlipidemia with target LDL less than 100   GAD (generalized anxiety disorder)   Malignant neoplasm of lung (HCC)   MDS (myelodysplastic syndrome) (HCC)   Anemia   Acute metabolic encephalopathy   Failure to thrive in adult   Elevated troponin   Lactic acidosis   Severe protein-calorie malnutrition (HCC)   Dehydration   Acute hypernatremia   1 sepsis/neutropenic fever Patient presented with lethargy, unresponsiveness per family with ongoing melanotic stools.  Patient with poor oral intake.  Patient meeting sepsis physiology as on presentation temperature was 101.9, tachycardic with a heart rate of 140, tachypneic with respiratory rate as high as 48 and on presentation blood pressure of  66/50.  Comprehensive metabolic profile obtained with a creatinine of 2.09 with worsening renal function and elevated BUN of 57.  Patient noted to be hyponatremic.  Lactic acid level elevated at 3.60.  Patient also noted to have a troponin of 2.40.  CBC showed pancytopenia with a platelet count of 3, WBC of 1.9, hemoglobin of 6.8.  ANC was 0.6.  Blood cultures were obtained and are pending.  Urinalysis leukocytes negative, nitrite negative.  Urine cultures pending.  Patient noted to have an elevated lactic acid level.  IV vancomycin IV aztreonam were ordered in the ED.  Case was discussed extensively with family and healthcare power of attorney who patient's son and daughter.  Patient noted to have a myelodysplastic syndrome that has likely progressed to AML per oncology's note, patient receiving transfusions almost every 2 weeks per family with ongoing melanotic stools.  Per oncology's note patient if no response to transfusions and no significant clinical improvement may need to transition to palliative care.  Case was discussed with family who have decided on comfort measures.  Will admit patient to a MedSurg bed.  Discontinue IV antibiotics.  Hydrate with IV fluids.  Supportive care.  Will order the end of life order set and consult with palliative care for symptom management.  2.  Pancytopenia/severe thrombocytopenia Secondary to myelodysplastic syndrome that has progressed to probable AML per oncology note.  Patient receiving multiple transfusions every 2 weeks however with no significant clinical improvement.  Patient recently was transfused 2 units packed red blood cells as well as platelets 5 days prior to admission however hemoglobin at 6.8 and was 6.9.  Platelet count now at 3 and was 4 prior to transfusions.  Patient with ongoing melanotic stools.  Case discussed with family and it is noted that patient likely with a poor outcome.  Per oncology's note not a candidate for aggressive therapy secondary  to severe thrombocytopenia.  Family has decided on comfort measures at this time and as such we will not transfuse any blood products.  We will keep patient comfortable.  Palliative care consultation for symptom management.  3.  Hypernatremia Likely secondary to dehydration.  IV fluids.  4.  Acute on chronic kidney disease stage III Likely secondary to prerenal azotemia as patient with ongoing melanotic stools, pancytopenic, poor oral intake and on admission noted to have systolic blood pressures in the 60s.  Gentle hydration with IV fluids.  Follow.  5.  Myelodysplastic syndrome likely progressing to AML Patient seen by her oncologist 06/10/2018 and per oncology note patient has not had any response to 6 cycles of azacitidine.  Oncology not recommending any further therapy.  Per oncology note patient is not a candidate for aggressive therapy given his severe thrombocytopenia and GI bleed.  Patient's blood counts have been monitored every 2 weeks and patient given supportive transfusions as needed.  Per oncology note if supportive transfusion does not help functional status may need to consider palliative care and hospice.  Family is in agreement with comfort measures at this time.  Will consult with palliative care for symptom management.  6.  Small cell lung cancer Status post chemoradiation July 2017.  Patient now comfort measures.  7.  Severe protein calorie malnutrition Secondary to problem #5.  Family have decided on comfort measures at this time.  8.  Elevated troponin Secondary to sepsis physiology.  Patient with no chest pain per family recently.  EKG with sinus tachycardia.  Family has decided on comfort measures and as such at this time no further cardiac work-up is needed.  Follow.  9.  Melena Likely secondary to severe thrombocytopenia.  Platelet count of 3.  Family have decided on comfort measures at this time.  Supportive care.  10.  Acute metabolic encephalopathy Likely  secondary to problem #1.  Family have decided on comfort measures at this time.  Place on IV fluids for the next 24 to 48 hours as requested per family, as family still coming out of town and would like patient to be a little more alert.  Supportive care.  11.  Prognosis/goals of care Patient with history of small cell carcinoma of the lung status post chemoradiation and now with drug-induced myelodysplastic syndrome which likely has progressed to AML per oncologist last note from 06/10/2018.  Patient status post treatment with azacitidine with no response.  Oncology not recommending any further treatment.  Oncology feels patient is not a candidate for any aggressive therapy given his severe thrombocytopenia and GI bleed.  Blood counts were monitored every 2 weeks and patient supported with transfusions as needed.  No significant improvement with transfusions.  Per oncology if supportive transfusion does not help functional status will need to consider palliative care and hospice.  Patient with poor oral intake.  Case discussed with family as patient now presenting with a neutropenic fever septic ongoing GI bleed, hypernatremia, acute on chronic kidney disease, pancytopenia with a severe thrombocytopenia with platelet count of 3 with ongoing bleeding and lethargy.  Patient with a significantly poor prognosis and likely at end-of-life.  Family understanding and wishes for comfort measures at this time.  Consulted palliative care for symptom management.  Supportive care/comfort measures.  Will order end-of-life order set.  Patient likely in hospital death.  DVT prophylaxis: Comfort measures Code Status: DNR Family Communication: Updated patient's children who are at bedside including son and daughter who were [place]. Disposition Plan: Likely in-hospital death versus residential hospice Consults called: Palliative care Admission status: Admit to inpatient.  MedSurg.   Irine Seal MD [facility] Pager [phone]  If 7PM-7AM, please contact night-coverage [email or web address] Password [facility]  06/29/2018, 3:01 PM

## 2018-07-15 NOTE — ED Notes (Signed)
CRITICAL VALUE ALERT  Critical Value:  Troponin 2.4  Date & Time Notied:  07/11/2018 & 1230 hrs  Provider Notified: Dr. Elise Benne  Orders Received/Actions taken: N/A

## 2018-07-15 NOTE — ED Notes (Signed)
CRITICAL VALUE ALERT  Critical Value: Lactic Acid 6.46   Date & Time Notied:  07/12/2018 & 1457 hrs  Provider Notified: Dr. Grandville Silos  Orders Received/Actions taken: N/A

## 2018-07-15 NOTE — Progress Notes (Signed)
Pharmacy Antibiotic Note  Janet Fletcher is a 82 y.o. female admitted on 07/17/2018 with infection- unknown source.  Pharmacy has been consulted for Vancomycin dosing.  Plan: Vancomycin 1000 mg IV x 1 dose. Vancomycin 750 mg IV every 48 hours.  Goal trough 15-20 mcg/mL.  Flagyl 500 mg IV every 8 hours. Aztreonam 2000 mg IV x 1 dose ordered. Follow-up further doses and possible change to cephalosporin if has tolerated.  Height: 5\' 5"  (165.1 cm) Weight: 123 lb 14.4 oz (56.2 kg) IBW/kg (Calculated) : 57  Temp (24hrs), Avg:100.1 F (37.8 C), Min:98.2 F (36.8 C), Max:101.9 F (38.8 C)  Recent Labs  Lab 07/09/18 0939 06/21/2018 1107 06/24/2018 1114  WBC 1.9* 1.9*  --   CREATININE 1.25* 2.09*  --   LATICACIDVEN  --   --  3.60*    Estimated Creatinine Clearance: 19 mL/min (A) (by C-G formula based on SCr of 2.09 mg/dL (H)).    Allergies  Allergen Reactions  . Flagyl [Metronidazole Hcl] Nausea Only  . Penicillins Other (See Comments)    Has patient had a PCN reaction causing immediate rash, facial/tongue/throat swelling, SOB or lightheadedness with hypotension: unknown Has patient had a PCN reaction causing severe rash involving mucus membranes or skin necrosis: unknown Has patient had a PCN reaction that required hospitalization unknown Has patient had a PCN reaction occurring within the last 10 years: unknown  If all of the above answers are "NO", then may proceed with Cephalosporin use.    Antimicrobials this admission: Vanco 12/25 >>  Flagyl 12/25 >>  Aztreonam 12/25 >>  Microbiology results: 12/25 BCx: pending 12/25 UCx: pending    Thank you for allowing pharmacy to be a part of this patient's care.  Ramond Craver 07/01/2018 1:59 PM

## 2018-07-15 NOTE — ED Notes (Signed)
CRITICAL RESULT  06/30/2018 1118  Lactic  Result: 3.60  MD Notified: Towanda Malkin

## 2018-07-15 NOTE — ED Provider Notes (Signed)
[facility] EMERGENCY DEPARTMENT Provider Note   CSN: [id number] Arrival date & time: 07/03/2018  1043     History   Chief Complaint Chief Complaint  Patient presents with  . Altered Mental Status    HPI Janet Fletcher is a 82 y.o. female.  The history is provided by the EMS personnel, a relative and a caregiver. The history is limited by the condition of the patient (AMS).  Altered Mental Status    Pt was seen at 1125. Per EMS and family report: Pt has hx "end stage cancer" and has been having multiple blood/platelet transfusions due to chronic GI bleeding; LD last week. Family states pt was more lethargic than usual this morning, and "not responding." Family also does endorse that pt "didn't perk up like usual" after the blood transfusion last week. Family states pt's chronic black/bloody stools continue. EMS states pt's "SBP was 70's" on scene.   Past Medical History:  Diagnosis Date  . Allergy   . Cancer ([place])    lung  . Hyperlipidemia   . Iron deficiency anemia 12/15/2015  . Low vitamin B12 level 12/15/2015  . Osteopenia     Patient Active Problem List   Diagnosis Date Noted  . Sepsis ([place]) 07/11/2018  . Melena 07/08/2018  . Acute metabolic encephalopathy 82/50/5397  . Failure to thrive in adult 07/02/2018  . Neutropenic fever ([place]) 07/21/2018  . Elevated troponin 07/21/2018  . Lactic acidosis 06/29/2018  . Severe thrombocytopenia ([place]) 06/25/2018  . Severe protein-calorie malnutrition ([place]) 07/03/2018  . Dehydration 06/30/2018  . Acute hypernatremia 06/27/2018  . Dark stools 06/01/2018  . Anemia 06/01/2018  . Malignant neoplasm of lung ([place])   . MDS (myelodysplastic syndrome) ([place])   . Palliative care encounter   . Pancytopenia ([place]) 05/18/2018  . Hypotension 05/18/2018  . Fatigue 11/27/2017  . MDS (myelodysplastic syndrome), high grade ([place]) 10/20/2017  . Anemia in chronic kidney disease (CKD) 09/20/2017  . Mild memory disturbance 06/05/2017  .  Helicobacter pylori gastritis 04/03/2017  . Tobacco abuse 12/15/2015  . Low vitamin B12 level 12/15/2015  . Iron deficiency anemia 12/15/2015  . Small cell carcinoma of right lung ([place]) 12/07/2015  . Combined forms of age-related cataract of right eye 01/19/2015  . Combined forms of age-related cataract of left eye 12/15/2014  . Hyperlipidemia with target LDL less than 100 09/29/2014  . GAD (generalized anxiety disorder) 09/29/2014    Past Surgical History:  Procedure Laterality Date  . CATARACT EXTRACTION, BILATERAL Bilateral   . COLONOSCOPY N/A 12/06/2016   Procedure: COLONOSCOPY;  Surgeon: Danie Binder, MD;  Location: [facility] ENDO SU[facility];  Service: Endoscopy;  Laterality: N/A;  1:45pm  . ESOPHAGOGASTRODUODENOSCOPY N/A 12/06/2016   Procedure: ESOPHAGOGASTRODUODENOSCOPY (EGD);  Surgeon: Danie Binder, MD;  Location: [facility] ENDO SUITE;  Service: Endoscopy;  Laterality: N/A;  . implanted port  2017  . MINOR EXCISION EAR CANAL CYST Left    30+ years ago  . SMALL INTESTINE SURGERY    . VIDEO BRONCHOSCOPY WITH ENDOBRONCHIAL ULTRASOUND N/A 12/11/2015   Procedure: VIDEO BRONCHOSCOPY WITH ENDOBRONCHIAL ULTRASOUND;  Surgeon: Melrose Nakayama, MD;  Location: [place];  Service: Thoracic;  Laterality: N/A;     OB History   No obstetric history on file.      Home Medications    Prior to Admission medications   Medication Sig Start Date End Date Taking? Authorizing Provider  albuterol (PROVENTIL HFA;VENTOLIN HFA) 108 (90 Base) MCG/ACT inhaler Inhale into the lungs. 05/04/18   [provider]  ANORO ELLIPTA 62.5-25 MCG/INH AEPB Inhale 1 puff into the lungs daily.  04/07/18   [provider]  cyanocobalamin (,VITAMIN B-12,) 1000 MCG/ML injection Inject 1 mL (1,000 mcg total) into the muscle every 30 (thirty) days. 03/26/18   Derek Jack, MD  docusate sodium (COLACE) 100 MG capsule Take 100 mg by mouth 2 (two) times daily.     [provider]  donepezil (ARICEPT) 10  MG tablet TAKE 1 TABLET BY MOUTH AT  BEDTIME 06/17/18   Hassell Done, Mary-Margaret, FNP  feeding supplement, ENSURE ENLIVE, (ENSURE ENLIVE) LIQD Take 237 mLs by mouth 2 (two) times daily between meals. 05/21/18   Manuella Ghazi, Pratik D, DO  memantine (NAMENDA) 5 MG tablet Take 1 tablet (5 mg total) by mouth 2 (two) times daily. 03/31/18   Hassell Done, Mary-Margaret, FNP  mirtazapine (REMERON) 7.5 MG tablet Take 1 tablet (7.5 mg total) by mouth at bedtime. 06/10/18   Lockamy, Randi L, NP-C  ondansetron (ZOFRAN) 8 MG tablet Take 1 tablet (8 mg total) by mouth 2 (two) times daily. Start on the third day from the last day of chemotherapy 11/17/17   Derek Jack, MD  [facility] HFA 108 [phone] Base) MCG/ACT inhaler Inhale 2 puffs into the lungs every 6 (six) hours as needed for wheezing or shortness of breath.  05/04/18   [provider]  prochlorperazine (COMPAZINE) 10 MG tablet Take 1 tablet (10 mg total) by mouth every 6 (six) hours as needed for nausea or vomiting. 11/17/17   Derek Jack, MD  umeclidinium-vilanterol (ANORO ELLIPTA) 62.5-25 MCG/INH AEPB Inhale into the lungs. 04/07/18   [provider]    Family History Family History  Problem Relation Age of Onset  . Cancer Mother   . Hypertension Father   . Colon cancer Neg Hx     Social History Social History   Tobacco Use  . Smoking status: Former Smoker    Packs/day: 0.25    Years: 50.00    Pack years: 12.50    Last attempt to quit: 01/08/2016    Years since quitting: 2.5  . Smokeless tobacco: Never Used  Substance Use Topics  . Alcohol use: No  . Drug use: No     Allergies   Flagyl [metronidazole hcl] and Penicillins   Review of Systems Review of Systems  Unable to perform ROS: Mental status change     Physical Exam Updated Vital Signs BP 96/66   Pulse (!) 132   Temp (!) 101.9 F (38.8 C) (Rectal)   Resp (!) 40   Ht 5\' 5"  (1.651 m)   Wt 56.2 kg   SpO2 91%   BMI 20.62 kg/m    Patient Vitals for the  past 24 hrs:  BP Temp Temp src Pulse Resp SpO2 Height Weight  06/22/2018 1400 96/66 - - (!) 132 (!) 40 91 % - -  06/23/2018 1330 99/64 - - (!) 134 (!) 45 92 % - -  06/22/2018 1300 114/70 - - (!) 130 (!) 48 90 % - -  07/03/2018 1246 - - - - - - 5\' 5"  (1.651 m) 56.2 kg  07/14/2018 1230 (!) 103/57 - - (!) 131 (!) 36 93 % - -  07/18/2018 1208 (!) 84/44 - - (!) 140 (!) 36 94 % - -  07/03/2018 1201 (!) 84/44 - - - (!) 34 - - -  07/04/2018 1130 (!) 73/41 - - - (!) 34 - - -  07/03/2018 1125 - (!) 101.9 F (38.8 C)  Rectal - - - - -  07/21/2018 1100 (!) 76/58 - - - (!) 34 - - -  07/21/2018 1050 (!) 66/50 98.2 F (36.8 C) Oral (!) 107 20 91 % - -  07/10/2018 1048 (!) 66/50 - - (!) 37 (!) 42 90 % - -  06/23/2018 1046 - - - - - - - 56.2 kg     Physical Exam 1130: Physical examination:  Nursing notes reviewed; Vital signs and O2 SAT reviewed;  Constitutional: Thin, frail. In no acute distress; Head:  Normocephalic, atraumatic; Eyes: EOMI, PERRL, No scleral icterus; ENMT: Mouth and pharynx normal, Mucous membranes dry; Neck: Supple, Full range of motion, No lymphadenopathy; Cardiovascular: Tachycardic rate and rhythm, No gallop; Respiratory: Breath sounds coarse & equal bilaterally, No wheezes. Normal respiratory effort/excursion; Chest: Nontender, Movement normal; Abdomen: Soft, Nontender, Nondistended, Normal bowel sounds; Genitourinary: No CVA tenderness; Extremities: Peripheral pulses normal, No deformity, No edema, No calf edema or asymmetry.; Neuro: Laying eyes closed. No facial droop. Moves extremities on stretcher occasionally...; Skin: Color normal, Warm, Dry.   ED Treatments / Results  Labs (all labs ordered are listed, but only abnormal results are displayed)   EKG EKG Interpretation  Date/Time:  Wednesday July 15 2018 11:54:25 EST Ventricular Rate:  108 PR Interval:    QRS Duration: 89 QT Interval:  364 QTC Calculation: 488 R Axis:   69 Text Interpretation:  Sinus tachycardia Borderline low voltage,  extremity leads Borderline prolonged QT interval When compared with ECG of 05/18/2018 Rate faster Confirmed by Francine Graven [phone]) on 06/29/2018 1:40:45 PM   Radiology   Procedures Procedures (including critical care time)  Medications Ordered in ED Medications  acetaminophen (TYLENOL) tablet 650 mg (has no administration in time range)  acetaminophen (TYLENOL) tablet 650 mg (has no administration in time range)    Or  acetaminophen (TYLENOL) suppository 650 mg (has no administration in time range)  haloperidol (HALDOL) tablet 0.5 mg (has no administration in time range)    Or  haloperidol (HALDOL) 2 MG/ML solution 0.5 mg (has no administration in time range)    Or  haloperidol lactate (HALDOL) injection 0.5 mg (has no administration in time range)  ondansetron (ZOFRAN-ODT) disintegrating tablet 4 mg (has no administration in time range)    Or  ondansetron (ZOFRAN) injection 4 mg (has no administration in time range)  glycopyrrolate (ROBINUL) tablet 1 mg (has no administration in time range)    Or  glycopyrrolate (ROBINUL) injection 0.2 mg (has no administration in time range)    Or  glycopyrrolate (ROBINUL) injection 0.2 mg (has no administration in time range)  antiseptic oral rinse (BIOTENE) solution 15 mL (has no administration in time range)  polyvinyl alcohol (LIQUIFILM TEARS) 1.4 % ophthalmic solution 1 drop (has no administration in time range)  sodium chloride flush (NS) 0.9 % injection 3 mL (has no administration in time range)  sodium chloride flush (NS) 0.9 % injection 3 mL (has no administration in time range)  0.9 %  sodium chloride infusion (250 mLs Intravenous New Bag/Given 07/14/2018 1421)  magic mouthwash w/lidocaine (has no administration in time range)  morphine CONCENTRATE 10 MG/0.5ML oral solution 5 mg (has no administration in time range)    Or  morphine CONCENTRATE 10 MG/0.5ML oral solution 5 mg (has no administration in time range)  morphine 2 MG/ML  injection 1 mg (has no administration in time range)  temazepam (RESTORIL) capsule 7.5 mg (has no administration in time range)  LORazepam (ATIVAN) tablet 1 mg (has  no administration in time range)    Or  LORazepam (ATIVAN) tablet 1 mg (has no administration in time range)    Or  LORazepam (ATIVAN) injection 1 mg (has no administration in time range)  diphenhydrAMINE (BENADRYL) injection 12.5 mg (has no administration in time range)  bisacodyl (DULCOLAX) suppository 10 mg (has no administration in time range)  oxybutynin (DITROPAN) tablet 2.5 mg (has no administration in time range)  loperamide (IMODIUM) capsule 2 mg (has no administration in time range)  alum & mag hydroxide-simeth (MAALOX/MYLANTA) 200-200-20 MG/5ML suspension 30 mL (has no administration in time range)  chlorproMAZINE (THORAZINE) 12.5 mg in sodium chloride 0.9 % 25 mL IVPB (has no administration in time range)  albuterol (PROVENTIL) (2.5 MG/3ML) 0.083% nebulizer solution 2.5 mg (has no administration in time range)  nystatin (MYCOSTATIN/NYSTOP) topical powder (has no administration in time range)  prochlorperazine (COMPAZINE) injection 10 mg (has no administration in time range)  dextrose 5 % solution (has no administration in time range)  sodium chloride 0.9 % bolus 1,686 mL (0 mL/kg  56.2 kg Intravenous Stopped 07/04/2018 1344)  aztreonam (AZACTAM) 2 g in sodium chloride 0.9 % 100 mL IVPB (0 g Intravenous Stopped 07/03/2018 1459)     Initial Impression / Assessment and Plan / ED Course  I have reviewed the triage vital signs and the nursing notes.  Pertinent labs & imaging results that were available during my care of the patient were reviewed by me and considered in my medical decision making (see chart for details).  MDM Reviewed: previous chart, nursing note and vitals Reviewed previous: labs and ECG Interpretation: labs, ECG and x-ray Total time providing critical care: 30-74 minutes. This excludes time spent  performing separately reportable procedures and services. Consults: admitting MD   CRITICAL CARE Performed by: Francine Graven Total critical care time: 35 minutes Critical care time was exclusive of separately billable procedures and treating other patients. Critical care was necessary to treat or prevent imminent or life-threatening deterioration. Critical care was time spent personally by me on the following activities: development of treatment plan with patient and/or surrogate as well as nursing, discussions with consultants, evaluation of patient's response to treatment, examination of patient, obtaining history from patient or surrogate, ordering and performing treatments and interventions, ordering and review of laboratory studies, ordering and review of radiographic studies, pulse oximetry and re-evaluation of patient's condition.   Results for orders placed or performed during the hospital encounter of 06/30/2018  Culture, blood (Routine x 2)  Result Value Ref Range   Specimen Description BLOOD PORTA CATH    Special Requests      BOTTLES DRAWN AEROBIC AND ANAEROBIC Blood Culture adequate volume Performed at [facility], [address], [place], [place] [address]    Culture PENDING    Report Status PENDING   Culture, blood (Routine x 2)  Result Value Ref Range   Specimen Description BLOOD LEFT HAND    Special Requests      BOTTLES DRAWN AEROBIC ONLY Blood Culture adequate volume Performed at [facility], [address], [place], [place] [address]    Culture PENDING    Report Status PENDING   Comprehensive metabolic panel  Result Value Ref Range   Sodium 148 (H) 135 - 145 mmol/L   Potassium 4.3 3.5 - 5.1 mmol/L   Chloride 118 (H) 98 - 111 mmol/L   CO2 20 (L) 22 - 32 mmol/L   Glucose, Bld 139 (H) 70 - 99 mg/dL   BUN 57 (H)  8 - 23 mg/dL   Creatinine, Ser 2.09 (H) 0.44 - 1.00 mg/dL   Calcium 8.7 (L) 8.9 - 10.3 mg/dL   Total Protein 6.1 (L) 6.5 - 8.1 g/dL   Albumin 2.8  (L) 3.5 - 5.0 g/dL   AST 67 (H) 15 - 41 U/L   ALT 17 0 - 44 U/L   Alkaline Phosphatase 54 38 - 126 U/L   Total Bilirubin 1.6 (H) 0.3 - 1.2 mg/dL   GFR calc non Af Amer 22 (L) >60 mL/min   GFR calc Af Amer 25 (L) >60 mL/min   Anion gap 10 5 - 15  CBC with Differential  Result Value Ref Range   WBC 1.9 (L) 4.0 - 10.5 K/uL   RBC 2.39 (L) 3.87 - 5.11 MIL/uL   Hemoglobin 6.8 (LL) 12.0 - 15.0 g/dL   HCT 21.9 (L) 36.0 - 46.0 %   MCV 91.6 80.0 - 100.0 fL   MCH 28.5 26.0 - 34.0 pg   MCHC 31.1 30.0 - 36.0 g/dL   RDW 15.6 (H) 11.5 - 15.5 %   Platelets 3 (LL) 150 - 400 K/uL   nRBC 1.6 (H) 0.0 - 0.2 %   Neutrophils Relative % 31 %   Neutro Abs 0.6 (L) 1.7 - 7.7 K/uL   Lymphocytes Relative 22 %   Lymphs Abs 0.4 (L) 0.7 - 4.0 K/uL   Monocytes Relative 31 %   Monocytes Absolute 0.6 0.1 - 1.0 K/uL   Eosinophils Relative 0 %   Eosinophils Absolute 0.0 0.0 - 0.5 K/uL   Basophils Relative 1 %   Basophils Absolute 0.0 0.0 - 0.1 K/uL   Immature Granulocytes 15 %   Abs Immature Granulocytes 0.29 (H) 0.00 - 0.07 K/uL  Protime-INR  Result Value Ref Range   Prothrombin Time 19.8 (H) 11.4 - 15.2 seconds   INR 1.70   Urinalysis, Routine w reflex microscopic  Result Value Ref Range   Color, Urine AMBER (A) YELLOW   APPearance HAZY (A) CLEAR   Specific Gravity, Urine 1.019 1.005 - 1.030   pH 5.0 5.0 - 8.0   Glucose, UA NEGATIVE NEGATIVE mg/dL   Hgb urine dipstick SMALL (A) NEGATIVE   Bilirubin Urine SMALL (A) NEGATIVE   Ketones, ur NEGATIVE NEGATIVE mg/dL   Protein, ur NEGATIVE NEGATIVE mg/dL   Nitrite NEGATIVE NEGATIVE   Leukocytes, UA NEGATIVE NEGATIVE   RBC / HPF 0-5 0 - 5 RBC/hpf   WBC, UA 0-5 0 - 5 WBC/hpf   Bacteria, UA MANY (A) NONE SEEN   Squamous Epithelial / LPF 0-5 0 - 5   Mucus PRESENT    Hyaline Casts, UA PRESENT    Granular Casts, UA PRESENT   Troponin I - Once  Result Value Ref Range   Troponin I 2.40 (HH) <0.03 ng/mL  I-Stat CG4 Lactic Acid, ED  Result Value Ref Range    Lactic Acid, Venous 3.60 (HH) 0.5 - 1.9 mmol/L   Comment NOTIFIED PHYSICIAN   I-Stat CG4 Lactic Acid, ED  Result Value Ref Range   Lactic Acid, Venous 6.46 (HH) 0.5 - 1.9 mmol/L   Dg Chest Portable 1 View Result Date: 07/13/2018 CLINICAL DATA:  Lethargy and history of myelodysplasia and lung carcinoma. EXAM: PORTABLE CHEST 1 VIEW COMPARISON:  Chest x-ray on 05/18/2018 and CT of the chest on 04/28/2018 FINDINGS: Stable positioning of Port-A-Cath. Stable heart size. Stable aortic tortuosity. Low lung volumes with bibasilar atelectasis and potential component of a small left pleural effusion. There is  no evidence of pulmonary edema, consolidation, pneumothorax or visible mass. IMPRESSION: Low lung volumes with bibasilar atelectasis and possibly small left pleural effusion. Electronically Signed   By: Aletta Edouard M.D.   On: 07/04/2018 12:26    1135:  Pt hypotensive, tachycardic, lethargic. IVF bolus 30mg /kg ordered. DNR paperwork at bedside. EMS told ED RN there was a question on scene by family members if pt should even be transported to the ED.   1150:  Multiple family members now at bedside, including POA's. Long d/w them regarding pt and family wishes re: DNR/DNI, aggressiveness of interventions vs comfort, and overall thoughts regarding plan of care. Family is knowledgeable regarding pt's grave overall prognosis and is realistic that "this might be the end." States IVF, labs, UA, CXR are all OK. Pt DNR/DNI, no vasopressors, no other advanced life support measures.   1300:  Pt's eyes are open, looking around room, then closes them; minimally responsive. Multiple more family members here. POA's have discussed pt's condition with them: POAs with family agreement, state they do not want to give pt any further blood products (they believe Onc MD was going to stop this during next week's appointment anyway, as there is no other interventions for her at this point), and would like to continue care the  way it is, focusing on comfort. APAP and IV abx given. IVF continues with BP slowly increasing. Will not call code sepsis, as pt's care will not be escalating per family request and likely transitioning at some point to comfort care. They are now stating to me that they are in agreement that "this may be her time."  T/C returned from [facility] [place] Silos, case discussed, including:  HPI, pertinent PM/SHx, VS/PE, dx testing, ED course and treatment:  Agreeable to come to ED for evaluation and d/w POA's and family members gathered.     Final Clinical Impressions(s) / ED Diagnoses     ED Discharge Orders    None       Francine Graven, DO 2018/08/07 1909

## 2018-07-15 NOTE — ED Triage Notes (Signed)
EMS reports pt has endstage cancer and reports has had to have several blood transfusions.  Reports last transfusion was Friday.  Family called ems this morning because pt more lethargic than usual.  EMS says bp was 60'Q systolic, hr 799.  O2 sat 96% on room air.  CBG 177.

## 2018-07-16 DIAGNOSIS — R4182 Altered mental status, unspecified: Secondary | ICD-10-CM

## 2018-07-18 LAB — CULTURE, BLOOD (ROUTINE X 2): Special Requests: ADEQUATE

## 2018-07-18 LAB — URINE CULTURE: Culture: 60000 — AB

## 2018-07-19 DIAGNOSIS — N179 Acute kidney failure, unspecified: Secondary | ICD-10-CM

## 2018-07-19 DIAGNOSIS — R4182 Altered mental status, unspecified: Secondary | ICD-10-CM

## 2018-07-20 LAB — CULTURE, BLOOD (ROUTINE X 2)
Culture: NO GROWTH
Special Requests: ADEQUATE

## 2018-07-22 NOTE — Death Summary Note (Signed)
Death Summary  IMAGINE NEST LEX:[id number] DOB: 1935/08/14 DOA: 2018-07-21  PCP: Chevis Pretty, FNP PCP/Office notified:   Admit date: 07-21-18 Date of Death: 07/22/18  Final Diagnoses:  Principal Problem:   Sepsis ([place]) Active Problems:   Neutropenic fever ([place])   Pancytopenia ([place])   Melena   Severe thrombocytopenia ([facility])   Hyperlipidemia with target LDL less than 100   GAD (generalized anxiety disorder)   Malignant neoplasm of lung ([facility])   MDS (myelodysplastic syndrome) ([facility])   Anemia   Acute metabolic encephalopathy   Failure to thrive in adult   Elevated troponin   Lactic acidosis   Severe protein-calorie malnutrition ([facility])   Dehydration   Acute hypernatremia    History of present illness: 315 hours                                                                     Janet Fletcher is a unfortunate, pleasant 83 y.o. 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  Patient at bedside minimally responsive and as such history is obtained from family members were at bedside.  Family does endorse general lethargy with decreased responsiveness, generalized weakness, melanotic stools that have been ongoing.  Family denies any recent fevers or chills, no chest pain, no shortness of breath, no nausea, no vomiting, no abdominal pain, no syncopal episodes, no dysuria, no constipation, no  hematemesis, no focal neurological symptoms.  ED Course: Patient seen in the ED, comprehensive metabolic profile obtained with a sodium of 148, chloride of 118, bicarb of 20, glucose 139, BUN of 57, creatinine of 2.09, albumin of 2.8, AST of 67, protein of 6.1, bilirubin of 1.6 otherwise was within normal limits.  Lactic acid was 3.60.  Troponin was 2.40.  CBC had a white count of 1.9 hemoglobin 6.8 platelet count of 3.  INR was 1.70.  Patient noted on presentation to have a temperature of 101.9, tachycardic with heart rate of 140, tachypneic with a respiratory rate of 48 and a blood pressure of 66/50.  Patient IV fluids with some improvement with blood pressure.  Blood cultures obtained.  Urinalysis was nitrite negative leukocytes  0-5 WBCs.  IV vancomycin IV Azactam were ordered.  Chest x-ray obtained with low lung volumes with bibasilar atelectasis and possibly small left pleural effusion.  EKG with a sinus tachycardia with no ischemic changes noted.  Hospitalist were called to admit the patient for further evaluation and management.  Per ED physician family leaning more towards comfort measures.   Hospital Course:  Patient was admitted with sepsis/neutropenic fever, lethargy, pancytopenia with severe thrombocytopenia secondary to myelodysplastic syndrome that had progressed to probable AML per her last oncology doctor's note.  It was noted that patient was receiving multiple transfusions every 2 weeks without any  significant clinical improvement.  Patient noted on admission to have a hemoglobin of 6.8, platelet of 3 with a white count of 1.9.  Patient noted to be hyponatremic felt secondary to dehydration and was an acute on chronic kidney disease stage III.  It was noted per patient's oncologist last note that patient was not a candidate for aggressive therapy secondary to her severe thrombocytopenia and noted that patient likely with a poor outcome.  Patient with ongoing melanotic stools on  presentation.  Family discussion was done with patient whole family including all her children.  Decision was made to keep patient comfortable and pursue full comfort measures.  Patient subsequently admitted end-of-life order set was ordered and patient was made comfortable.  Patient was pronounced dead at 0315 hrs. of 31-Jul-2018.  May her soul rest in peace.   Time: 30 mins  Signed:  Irine Seal  [facility] July 31, 2018, 7:33 AM   No charge.

## 2018-07-22 NOTE — Progress Notes (Signed)
Patient surrounded by family.  Patient no longer has pulse or respirations.  RNs listened to chest wall and no pulse present.  Supervisor and and MD notified.

## 2018-07-22 DEATH — deceased

## 2018-07-27 ENCOUNTER — Other Ambulatory Visit (HOSPITAL_COMMUNITY): Payer: Medicare Other

## 2018-07-27 ENCOUNTER — Ambulatory Visit (HOSPITAL_COMMUNITY): Payer: Medicare Other | Admitting: Hematology

## 2018-07-28 ENCOUNTER — Encounter (HOSPITAL_COMMUNITY): Payer: Medicare Other

## 2018-08-03 ENCOUNTER — Other Ambulatory Visit (HOSPITAL_COMMUNITY): Payer: Self-pay | Admitting: Nurse Practitioner

## 2018-08-03 DIAGNOSIS — D46Z Other myelodysplastic syndromes: Secondary | ICD-10-CM

## 2018-09-01 ENCOUNTER — Other Ambulatory Visit: Payer: Self-pay | Admitting: Nurse Practitioner

## 2018-09-01 DIAGNOSIS — R413 Other amnesia: Secondary | ICD-10-CM

## 2018-09-18 ENCOUNTER — Telehealth (HOSPITAL_COMMUNITY): Payer: Self-pay | Admitting: Hematology

## 2018-09-18 NOTE — Telephone Encounter (Signed)
MAILED ITEMIZED BILLS TO PTS FAMILY

## 2018-11-30 ENCOUNTER — Ambulatory Visit: Payer: Medicare Other | Admitting: Nurse Practitioner

## 2020-06-14 IMAGING — CT CT CHEST W/ CM
2 of 3 series · 15 of 36 positions shown, 18 images · IV contrast (omnipaque)
Comparison: Chest radiographs dated 01/12/2018. CT chest dated
07/02/2017.

CLINICAL DATA: Lung cancer, status post chemotherapy and radiation.
History of mild dysplastic syndrome. Cough, weakness.

EXAM:
CT CHEST WITH CONTRAST
TECHNIQUE: Multidetector CT imaging of the chest was performed during
intravenous contrast administration.
CONTRAST:  75mL OMNIPAQUE IOHEXOL 300 MG/ML  SOLN

[Series 2: axial st · axial · 0.62mm/px · z∈[-29,+247]mm · 12 of 162 slices shown, 15 images]
[im 12/162  mediastinal]
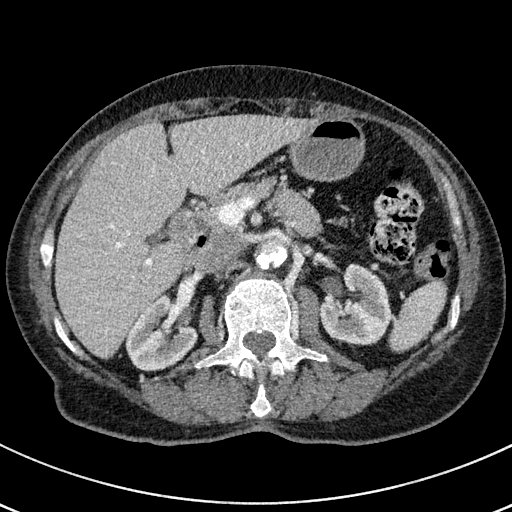
[im 12/162  lung]
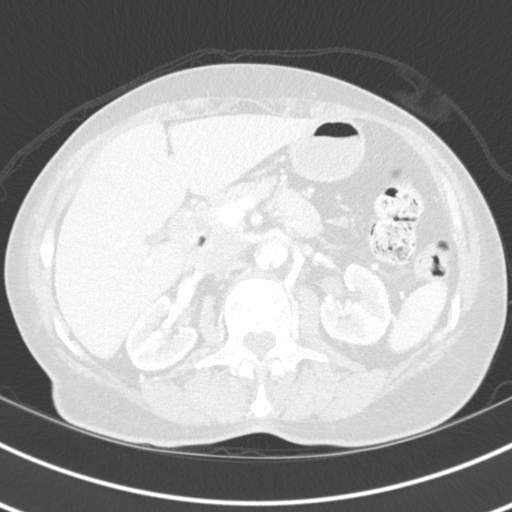
[im 24/162  lung]
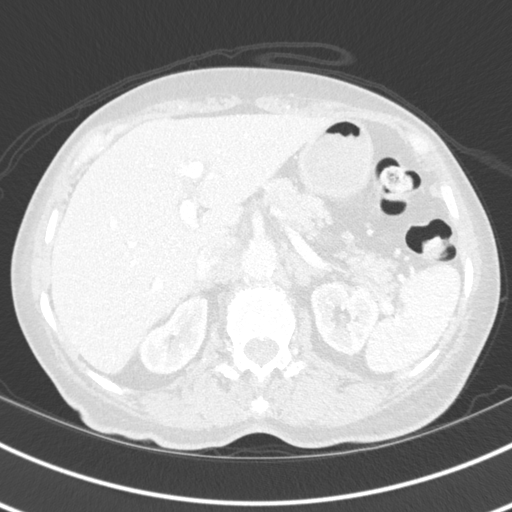
[im 36/162  lung]
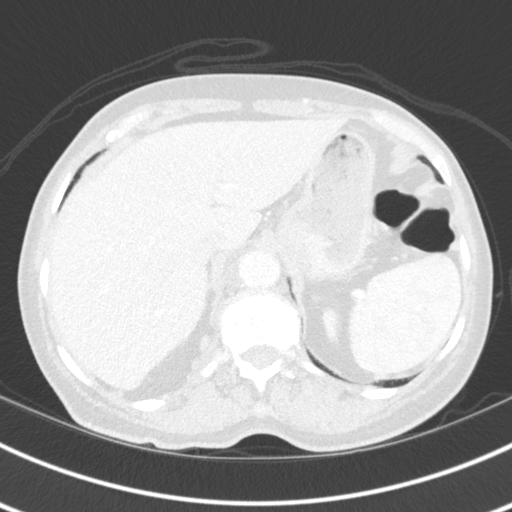
[im 48/162  lung]
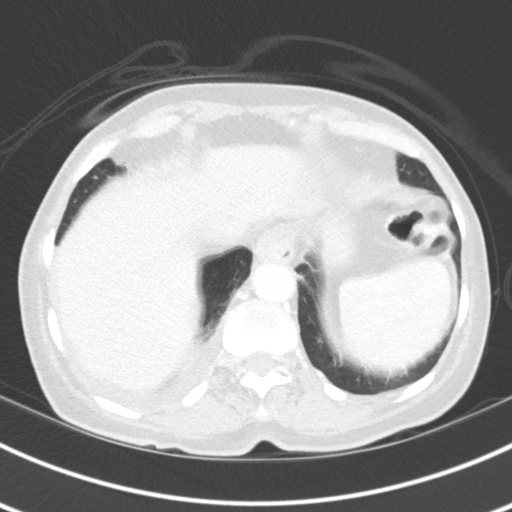
[im 60/162  mediastinal]
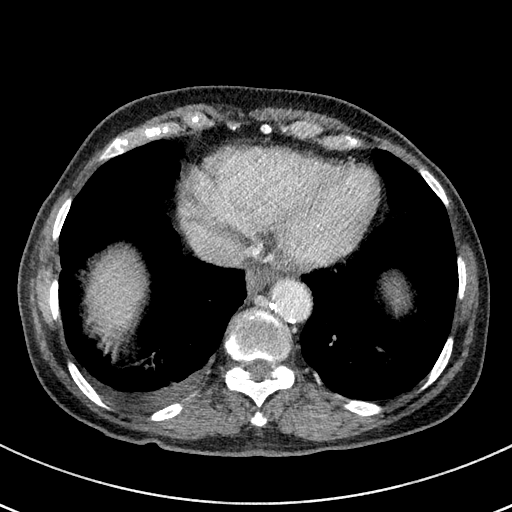
[im 60/162  lung]
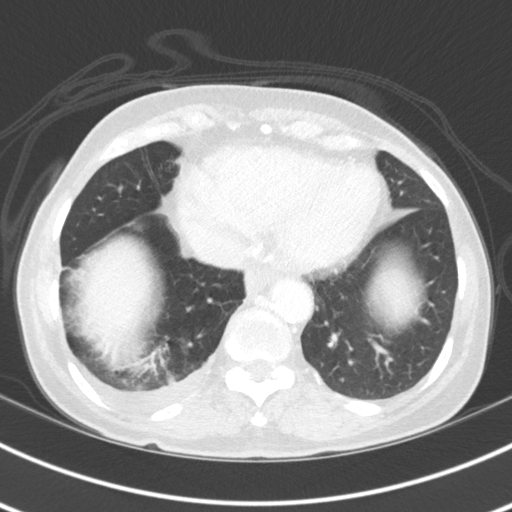
[im 72/162  lung]
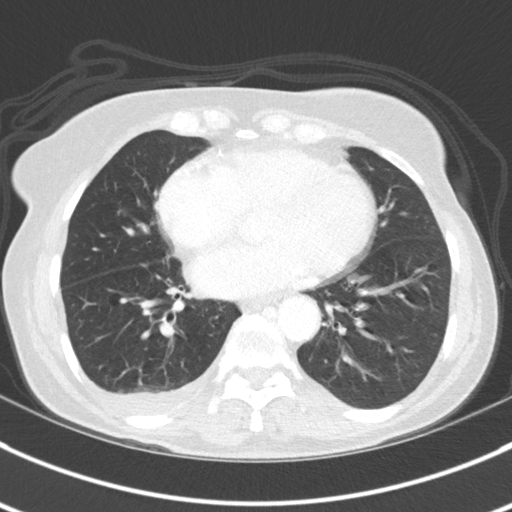
[im 90/162  lung]
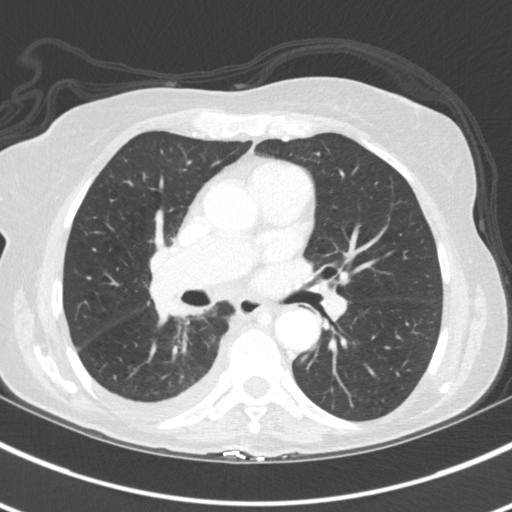
[im 102/162  lung]
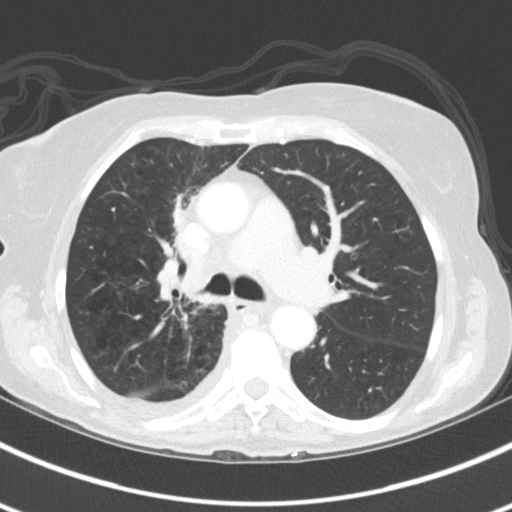
[im 114/162  mediastinal]
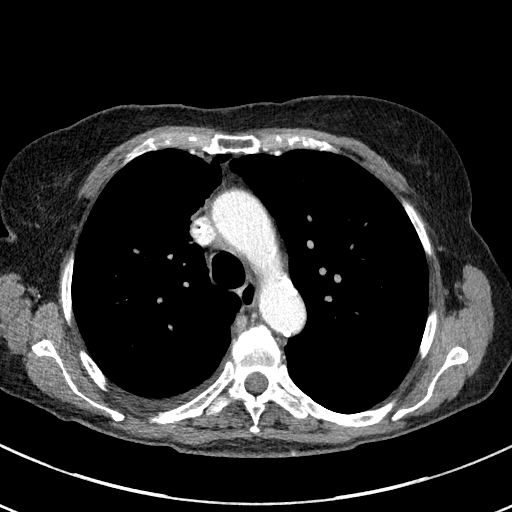
[im 114/162  lung]
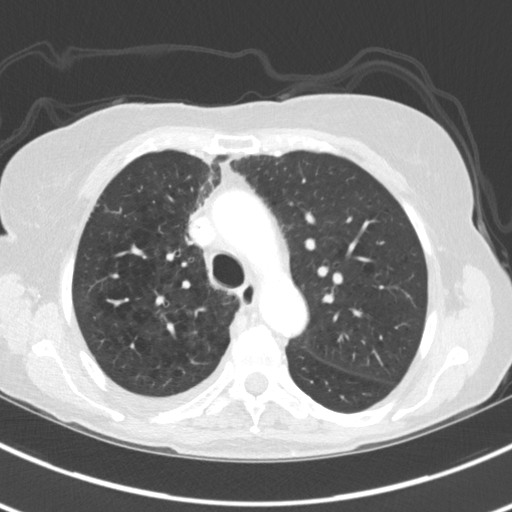
[im 126/162  lung]
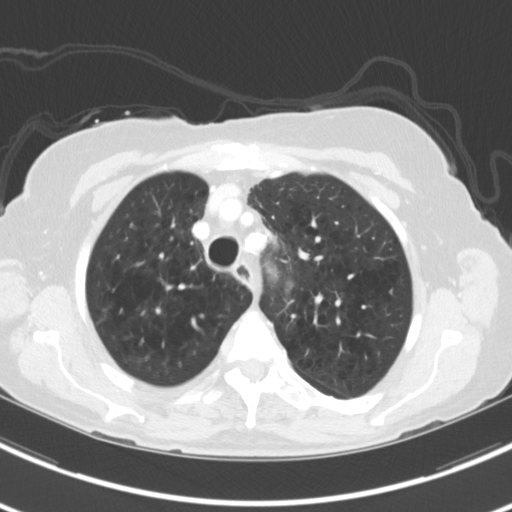
[im 138/162  lung]
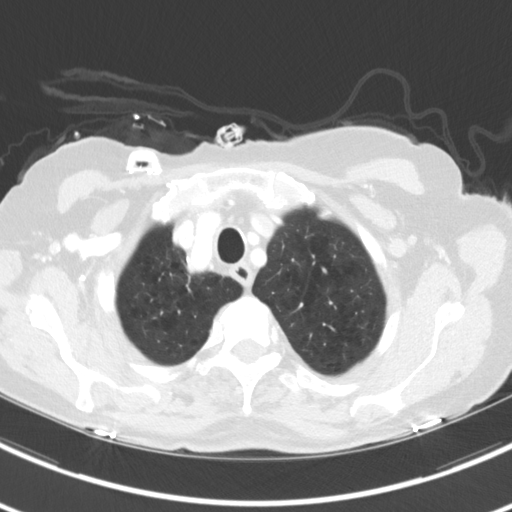
[im 150/162  lung]
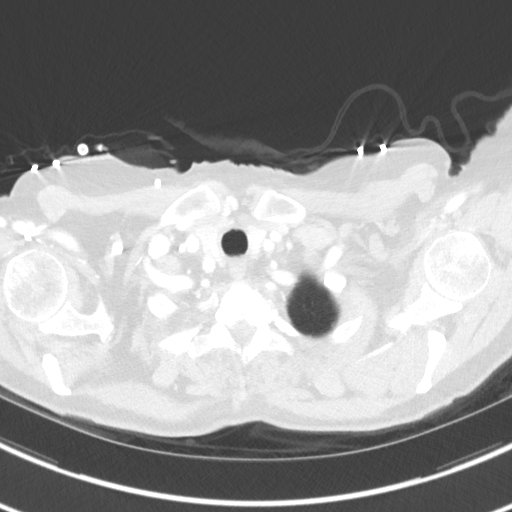

[Series 5: coronal · coronal · 0.59mm/px · 3 of 134 slices shown]
[im 27/134  lung]
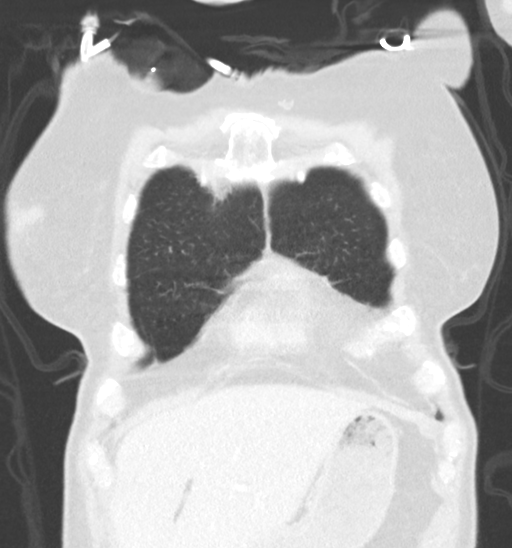
[im 54/134  lung]
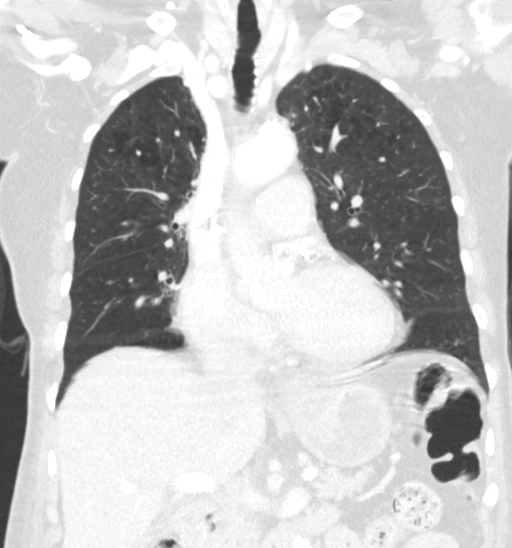
[im 80/134  lung]
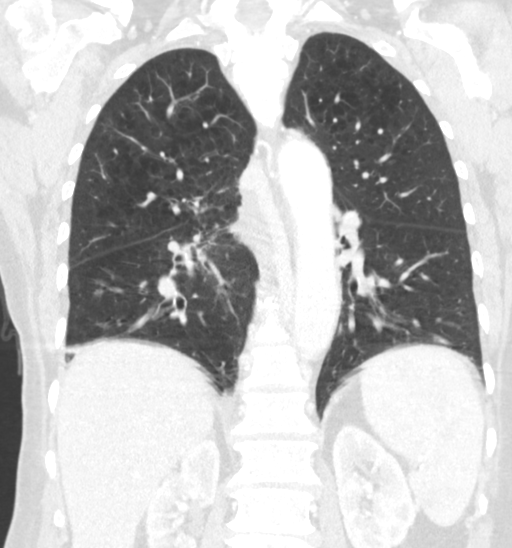

[15 of 36 positions shown; findings below may reference images not displayed]

FINDINGS: Cardiovascular: The heart is normal in size. No pericardial
effusion.

No evidence of thoracic aortic aneurysm. Atherosclerotic
calcifications of the aortic arch.

Three vessel atherosclerosis.

Right chest port terminates in the mid SVC.

Mediastinum/Nodes: Small mediastinal lymph nodes, including an 8 mm
short axis right paratracheal node (series 2/image 52) and a 7 mm
short axis subcarinal node, unchanged.

Visualized thyroid is unremarkable.

Lungs/Pleura: Radiation changes in the right
paramediastinal/perihilar region.

Moderate centrilobular emphysematous changes, upper lobe
predominant.

Small right pleural effusion, new.

Stable ground-glass nodules in the medial left upper lobe (series
4/image 34) and the lateral right upper lobe (series 4/images 57 and
58), grossly unchanged.

No focal consolidation.

No pneumothorax.

Upper Abdomen: Visualized upper abdomen is mildly motion degraded
but notable for stable thickening of the left adrenal gland without
discrete nodule/mass.

Musculoskeletal: Mild degenerative changes of the mid thoracic
spine.
IMPRESSION: Radiation changes in the right lung.

Small right pleural effusion, new/recurrent.

Small mediastinal lymph nodes, measuring 7-8 mm short axis, chronic.

Stable ground-glass nodules in the bilateral upper lobes.

Aortic Atherosclerosis ([facility]NE.E) and Emphysema ([facility]-KD1.0).

## 2023-07-22 ENCOUNTER — Telehealth: Payer: Self-pay | Admitting: Hematology

## 2023-07-22 NOTE — Telephone Encounter (Signed)
 Pts daughter Arland called questioning if the papers she dropped off around 06/26/23 had been completed. Papers were forwarded to HIM and were scanned in but it looks like they were not filled out.  Called Arland back and gave her the phone # to HIM to determine status of forms
